# Patient Record
Sex: Female | Born: 1960 | Race: Black or African American | Hispanic: No | Marital: Single | Smoking: Never smoker
Health system: Southern US, Community
[De-identification: ages and names within clinical notes are randomized; demographics above are authoritative.]

## PROBLEM LIST (undated history)

## (undated) DIAGNOSIS — R0602 Shortness of breath: Secondary | ICD-10-CM

## (undated) HISTORY — PX: BREAST BIOPSY: SHX20

## (undated) HISTORY — PX: OTHER SURGICAL HISTORY: SHX169

## (undated) HISTORY — PX: TUBAL LIGATION: SHX77

---

## 1998-01-28 ENCOUNTER — Encounter: Admission: RE | Admit: 1998-01-28 | Discharge: 1998-01-28 | Payer: Self-pay | Admitting: Sports Medicine

## 1998-02-02 ENCOUNTER — Encounter: Admission: RE | Admit: 1998-02-02 | Discharge: 1998-02-02 | Payer: Self-pay | Admitting: Family Medicine

## 1998-02-09 ENCOUNTER — Encounter: Admission: RE | Admit: 1998-02-09 | Discharge: 1998-02-09 | Payer: Self-pay | Admitting: Family Medicine

## 1998-04-19 ENCOUNTER — Inpatient Hospital Stay (HOSPITAL_COMMUNITY): Admission: EM | Admit: 1998-04-19 | Discharge: 1998-04-22 | Payer: Self-pay | Admitting: Emergency Medicine

## 1998-04-21 ENCOUNTER — Encounter: Admission: RE | Admit: 1998-04-21 | Discharge: 1998-04-21 | Payer: Self-pay | Admitting: Family Medicine

## 1998-04-30 ENCOUNTER — Encounter: Admission: RE | Admit: 1998-04-30 | Discharge: 1998-04-30 | Payer: Self-pay | Admitting: Family Medicine

## 1998-05-05 ENCOUNTER — Encounter: Admission: RE | Admit: 1998-05-05 | Discharge: 1998-05-05 | Payer: Self-pay | Admitting: Family Medicine

## 2004-05-05 ENCOUNTER — Emergency Department (HOSPITAL_COMMUNITY): Admission: EM | Admit: 2004-05-05 | Discharge: 2004-05-05 | Payer: Self-pay | Admitting: Emergency Medicine

## 2004-07-27 ENCOUNTER — Emergency Department (HOSPITAL_COMMUNITY): Admission: EM | Admit: 2004-07-27 | Discharge: 2004-07-27 | Payer: Self-pay | Admitting: Emergency Medicine

## 2004-12-05 ENCOUNTER — Emergency Department (HOSPITAL_COMMUNITY): Admission: EM | Admit: 2004-12-05 | Discharge: 2004-12-06 | Payer: Self-pay | Admitting: Emergency Medicine

## 2005-02-20 ENCOUNTER — Ambulatory Visit: Payer: Self-pay | Admitting: Internal Medicine

## 2005-02-20 ENCOUNTER — Observation Stay (HOSPITAL_COMMUNITY): Admission: EM | Admit: 2005-02-20 | Discharge: 2005-02-21 | Payer: Self-pay | Admitting: Podiatry

## 2005-02-27 ENCOUNTER — Ambulatory Visit: Payer: Self-pay | Admitting: Internal Medicine

## 2005-03-19 ENCOUNTER — Ambulatory Visit: Payer: Self-pay | Admitting: Family Medicine

## 2005-03-19 ENCOUNTER — Inpatient Hospital Stay (HOSPITAL_COMMUNITY): Admission: EM | Admit: 2005-03-19 | Discharge: 2005-03-21 | Payer: Self-pay | Admitting: Emergency Medicine

## 2005-04-04 ENCOUNTER — Ambulatory Visit: Payer: Self-pay | Admitting: Family Medicine

## 2005-05-26 ENCOUNTER — Ambulatory Visit: Payer: Self-pay | Admitting: Family Medicine

## 2005-07-26 ENCOUNTER — Emergency Department (HOSPITAL_COMMUNITY): Admission: EM | Admit: 2005-07-26 | Discharge: 2005-07-26 | Payer: Self-pay | Admitting: Emergency Medicine

## 2005-08-04 ENCOUNTER — Ambulatory Visit: Payer: Self-pay | Admitting: Family Medicine

## 2006-01-17 ENCOUNTER — Emergency Department (HOSPITAL_COMMUNITY): Admission: EM | Admit: 2006-01-17 | Discharge: 2006-01-18 | Payer: Self-pay | Admitting: Emergency Medicine

## 2006-04-13 ENCOUNTER — Ambulatory Visit: Payer: Self-pay | Admitting: Sports Medicine

## 2006-04-17 ENCOUNTER — Encounter: Payer: Self-pay | Admitting: Emergency Medicine

## 2006-04-18 ENCOUNTER — Inpatient Hospital Stay (HOSPITAL_COMMUNITY): Admission: EM | Admit: 2006-04-18 | Discharge: 2006-04-22 | Payer: Self-pay | Admitting: Sports Medicine

## 2006-05-17 ENCOUNTER — Ambulatory Visit: Payer: Self-pay | Admitting: Sports Medicine

## 2006-06-28 ENCOUNTER — Ambulatory Visit: Payer: Self-pay | Admitting: Family Medicine

## 2006-08-29 ENCOUNTER — Ambulatory Visit: Payer: Self-pay | Admitting: Family Medicine

## 2006-09-03 ENCOUNTER — Emergency Department (HOSPITAL_COMMUNITY): Admission: EM | Admit: 2006-09-03 | Discharge: 2006-09-04 | Payer: Self-pay | Admitting: Emergency Medicine

## 2006-12-20 DIAGNOSIS — E663 Overweight: Secondary | ICD-10-CM | POA: Insufficient documentation

## 2006-12-20 DIAGNOSIS — F331 Major depressive disorder, recurrent, moderate: Secondary | ICD-10-CM | POA: Insufficient documentation

## 2006-12-28 ENCOUNTER — Ambulatory Visit: Payer: Self-pay | Admitting: Sports Medicine

## 2006-12-28 DIAGNOSIS — L259 Unspecified contact dermatitis, unspecified cause: Secondary | ICD-10-CM | POA: Insufficient documentation

## 2006-12-28 DIAGNOSIS — D508 Other iron deficiency anemias: Secondary | ICD-10-CM | POA: Insufficient documentation

## 2007-03-05 ENCOUNTER — Telehealth: Payer: Self-pay | Admitting: *Deleted

## 2007-03-05 ENCOUNTER — Ambulatory Visit: Payer: Self-pay | Admitting: Family Medicine

## 2007-03-05 ENCOUNTER — Inpatient Hospital Stay (HOSPITAL_COMMUNITY): Admission: AD | Admit: 2007-03-05 | Discharge: 2007-03-11 | Payer: Self-pay | Admitting: Sports Medicine

## 2007-03-05 ENCOUNTER — Ambulatory Visit: Payer: Self-pay | Admitting: Sports Medicine

## 2007-03-12 ENCOUNTER — Ambulatory Visit: Payer: Self-pay | Admitting: Family Medicine

## 2007-03-13 ENCOUNTER — Telehealth (INDEPENDENT_AMBULATORY_CARE_PROVIDER_SITE_OTHER): Payer: Self-pay | Admitting: Family Medicine

## 2007-04-02 ENCOUNTER — Telehealth: Payer: Self-pay | Admitting: *Deleted

## 2007-04-12 ENCOUNTER — Telehealth: Payer: Self-pay | Admitting: *Deleted

## 2007-04-15 ENCOUNTER — Ambulatory Visit: Payer: Self-pay | Admitting: Family Medicine

## 2007-04-16 ENCOUNTER — Encounter: Admission: RE | Admit: 2007-04-16 | Discharge: 2007-04-16 | Payer: Self-pay | Admitting: Sports Medicine

## 2007-04-16 ENCOUNTER — Telehealth: Payer: Self-pay | Admitting: *Deleted

## 2007-04-17 ENCOUNTER — Ambulatory Visit: Payer: Self-pay | Admitting: Family Medicine

## 2007-04-30 ENCOUNTER — Telehealth (INDEPENDENT_AMBULATORY_CARE_PROVIDER_SITE_OTHER): Payer: Self-pay | Admitting: Family Medicine

## 2007-05-11 ENCOUNTER — Emergency Department (HOSPITAL_COMMUNITY): Admission: EM | Admit: 2007-05-11 | Discharge: 2007-05-11 | Payer: Self-pay | Admitting: Emergency Medicine

## 2007-05-16 ENCOUNTER — Telehealth (INDEPENDENT_AMBULATORY_CARE_PROVIDER_SITE_OTHER): Payer: Self-pay | Admitting: *Deleted

## 2007-05-16 ENCOUNTER — Encounter (INDEPENDENT_AMBULATORY_CARE_PROVIDER_SITE_OTHER): Payer: Self-pay | Admitting: Family Medicine

## 2007-08-05 ENCOUNTER — Telehealth: Payer: Self-pay | Admitting: *Deleted

## 2007-08-06 ENCOUNTER — Ambulatory Visit: Payer: Self-pay | Admitting: Family Medicine

## 2007-08-06 ENCOUNTER — Encounter (INDEPENDENT_AMBULATORY_CARE_PROVIDER_SITE_OTHER): Payer: Self-pay | Admitting: Family Medicine

## 2007-08-06 ENCOUNTER — Telehealth: Payer: Self-pay | Admitting: *Deleted

## 2007-09-06 ENCOUNTER — Telehealth: Payer: Self-pay | Admitting: *Deleted

## 2007-09-08 ENCOUNTER — Emergency Department (HOSPITAL_COMMUNITY): Admission: EM | Admit: 2007-09-08 | Discharge: 2007-09-08 | Payer: Self-pay | Admitting: Emergency Medicine

## 2007-09-09 ENCOUNTER — Telehealth: Payer: Self-pay | Admitting: *Deleted

## 2007-11-15 ENCOUNTER — Telehealth (INDEPENDENT_AMBULATORY_CARE_PROVIDER_SITE_OTHER): Payer: Self-pay | Admitting: *Deleted

## 2007-11-26 ENCOUNTER — Telehealth: Payer: Self-pay | Admitting: *Deleted

## 2007-11-27 ENCOUNTER — Telehealth: Payer: Self-pay | Admitting: *Deleted

## 2007-12-04 ENCOUNTER — Ambulatory Visit: Payer: Self-pay | Admitting: Family Medicine

## 2008-01-07 ENCOUNTER — Telehealth: Payer: Self-pay | Admitting: Family Medicine

## 2008-01-24 ENCOUNTER — Inpatient Hospital Stay (HOSPITAL_COMMUNITY): Admission: EM | Admit: 2008-01-24 | Discharge: 2008-01-26 | Payer: Self-pay | Admitting: Emergency Medicine

## 2008-01-24 ENCOUNTER — Ambulatory Visit: Payer: Self-pay | Admitting: Internal Medicine

## 2008-01-24 ENCOUNTER — Encounter: Payer: Self-pay | Admitting: Family Medicine

## 2008-01-27 ENCOUNTER — Telehealth (INDEPENDENT_AMBULATORY_CARE_PROVIDER_SITE_OTHER): Payer: Self-pay | Admitting: *Deleted

## 2008-01-28 ENCOUNTER — Ambulatory Visit: Payer: Self-pay | Admitting: Sports Medicine

## 2008-07-09 ENCOUNTER — Encounter: Payer: Self-pay | Admitting: *Deleted

## 2008-07-20 ENCOUNTER — Telehealth: Payer: Self-pay | Admitting: *Deleted

## 2008-07-21 ENCOUNTER — Inpatient Hospital Stay (HOSPITAL_COMMUNITY): Admission: EM | Admit: 2008-07-21 | Discharge: 2008-07-23 | Payer: Self-pay | Admitting: Emergency Medicine

## 2008-07-21 ENCOUNTER — Ambulatory Visit: Payer: Self-pay | Admitting: Family Medicine

## 2008-07-21 ENCOUNTER — Encounter: Payer: Self-pay | Admitting: *Deleted

## 2008-07-21 ENCOUNTER — Encounter: Payer: Self-pay | Admitting: Family Medicine

## 2008-08-31 ENCOUNTER — Ambulatory Visit: Payer: Self-pay | Admitting: Family Medicine

## 2009-01-07 ENCOUNTER — Ambulatory Visit: Payer: Self-pay | Admitting: Family Medicine

## 2009-01-07 ENCOUNTER — Encounter: Payer: Self-pay | Admitting: Family Medicine

## 2009-01-07 ENCOUNTER — Telehealth: Payer: Self-pay | Admitting: Family Medicine

## 2009-01-13 ENCOUNTER — Telehealth: Payer: Self-pay | Admitting: Family Medicine

## 2009-01-14 ENCOUNTER — Ambulatory Visit: Payer: Self-pay | Admitting: Family Medicine

## 2009-01-14 ENCOUNTER — Ambulatory Visit (HOSPITAL_COMMUNITY): Admission: RE | Admit: 2009-01-14 | Discharge: 2009-01-14 | Payer: Self-pay | Admitting: Family Medicine

## 2009-01-14 ENCOUNTER — Encounter: Payer: Self-pay | Admitting: Family Medicine

## 2009-01-14 ENCOUNTER — Telehealth: Payer: Self-pay | Admitting: Family Medicine

## 2009-01-14 LAB — CONVERTED CEMR LAB
HCT: 37.5 % (ref 36.0–46.0)
Lymphocytes Relative: 18 % (ref 12–46)
MCHC: 34.4 g/dL (ref 30.0–36.0)
MCV: 81.9 fL (ref 78.0–100.0)
Neutro Abs: 9.9 10*3/uL — ABNORMAL HIGH (ref 1.7–7.7)
RBC: 4.58 M/uL (ref 3.87–5.11)
WBC: 13.9 10*3/uL — ABNORMAL HIGH (ref 4.0–10.5)

## 2009-01-15 ENCOUNTER — Telehealth: Payer: Self-pay | Admitting: *Deleted

## 2009-01-16 ENCOUNTER — Encounter: Payer: Self-pay | Admitting: Family Medicine

## 2009-02-17 ENCOUNTER — Telehealth: Payer: Self-pay | Admitting: Family Medicine

## 2009-05-05 ENCOUNTER — Telehealth: Payer: Self-pay | Admitting: *Deleted

## 2009-05-07 ENCOUNTER — Ambulatory Visit: Payer: Self-pay | Admitting: Family Medicine

## 2009-10-26 ENCOUNTER — Encounter: Payer: Self-pay | Admitting: Family Medicine

## 2009-10-26 ENCOUNTER — Ambulatory Visit: Payer: Self-pay | Admitting: Family Medicine

## 2009-10-26 DIAGNOSIS — N63 Unspecified lump in unspecified breast: Secondary | ICD-10-CM

## 2009-10-26 LAB — CONVERTED CEMR LAB
Chlamydia, DNA Probe: NEGATIVE
GC Probe Amp, Genital: NEGATIVE
HCT: 43 % (ref 36.0–46.0)
Hemoglobin: 14.5 g/dL (ref 12.0–15.0)
MCHC: 33.7 g/dL (ref 30.0–36.0)
Platelets: 333 10*3/uL (ref 150–400)
RBC: 5.13 M/uL — ABNORMAL HIGH (ref 3.87–5.11)
RDW: 13.7 % (ref 11.5–15.5)
WBC: 5.7 10*3/uL (ref 4.0–10.5)
Whiff Test: POSITIVE

## 2009-10-28 ENCOUNTER — Telehealth: Payer: Self-pay | Admitting: *Deleted

## 2009-10-29 ENCOUNTER — Encounter: Payer: Self-pay | Admitting: Family Medicine

## 2009-11-02 ENCOUNTER — Observation Stay (HOSPITAL_COMMUNITY): Admission: EM | Admit: 2009-11-02 | Discharge: 2009-11-03 | Payer: Self-pay | Admitting: Emergency Medicine

## 2009-11-02 ENCOUNTER — Ambulatory Visit: Payer: Self-pay | Admitting: Family Medicine

## 2009-11-02 ENCOUNTER — Encounter: Payer: Self-pay | Admitting: Family Medicine

## 2009-11-02 DIAGNOSIS — J4550 Severe persistent asthma, uncomplicated: Secondary | ICD-10-CM | POA: Insufficient documentation

## 2009-11-02 DIAGNOSIS — J45909 Unspecified asthma, uncomplicated: Secondary | ICD-10-CM | POA: Insufficient documentation

## 2009-11-09 ENCOUNTER — Ambulatory Visit: Payer: Self-pay | Admitting: Family Medicine

## 2010-01-18 ENCOUNTER — Telehealth: Payer: Self-pay | Admitting: Family Medicine

## 2010-01-21 ENCOUNTER — Ambulatory Visit: Payer: Self-pay | Admitting: Family Medicine

## 2010-01-21 ENCOUNTER — Encounter: Payer: Self-pay | Admitting: Family Medicine

## 2010-01-21 LAB — CONVERTED CEMR LAB
ALT: 9 units/L (ref 0–35)
Albumin: 4.5 g/dL (ref 3.5–5.2)
Bilirubin Urine: NEGATIVE
CO2: 21 meq/L (ref 19–32)
Calcium: 9.8 mg/dL (ref 8.4–10.5)
Chloride: 108 meq/L (ref 96–112)
Creatinine, Ser: 0.87 mg/dL (ref 0.40–1.20)
GC Probe Amp, Genital: NEGATIVE
Potassium: 4.1 meq/L (ref 3.5–5.3)
Protein, U semiquant: 30
Specific Gravity, Urine: >=1.03
Total Protein: 7.6 g/dL (ref 6.0–8.3)
Urobilinogen, UA: 0.2

## 2010-01-24 ENCOUNTER — Telehealth: Payer: Self-pay | Admitting: *Deleted

## 2010-02-05 ENCOUNTER — Telehealth: Payer: Self-pay | Admitting: Family Medicine

## 2010-02-07 ENCOUNTER — Ambulatory Visit: Payer: Self-pay | Admitting: Family Medicine

## 2010-02-16 ENCOUNTER — Emergency Department (HOSPITAL_COMMUNITY): Admission: EM | Admit: 2010-02-16 | Discharge: 2010-02-16 | Payer: Self-pay | Admitting: Emergency Medicine

## 2010-03-20 ENCOUNTER — Telehealth: Payer: Self-pay | Admitting: Family Medicine

## 2010-03-20 ENCOUNTER — Emergency Department (HOSPITAL_COMMUNITY): Admission: EM | Admit: 2010-03-20 | Discharge: 2010-03-20 | Payer: Self-pay | Admitting: Family Medicine

## 2010-03-20 ENCOUNTER — Inpatient Hospital Stay (HOSPITAL_COMMUNITY): Admission: EM | Admit: 2010-03-20 | Discharge: 2010-03-22 | Payer: Self-pay | Admitting: Emergency Medicine

## 2010-03-21 ENCOUNTER — Encounter: Payer: Self-pay | Admitting: Family Medicine

## 2010-03-21 ENCOUNTER — Ambulatory Visit: Payer: Self-pay | Admitting: Family Medicine

## 2010-03-21 DIAGNOSIS — E876 Hypokalemia: Secondary | ICD-10-CM | POA: Insufficient documentation

## 2010-03-25 ENCOUNTER — Telehealth: Payer: Self-pay | Admitting: Family Medicine

## 2010-03-25 ENCOUNTER — Encounter: Payer: Self-pay | Admitting: Family Medicine

## 2010-04-11 ENCOUNTER — Telehealth: Payer: Self-pay | Admitting: Family Medicine

## 2010-04-11 ENCOUNTER — Encounter: Payer: Self-pay | Admitting: *Deleted

## 2010-04-27 ENCOUNTER — Telehealth: Payer: Self-pay | Admitting: Family Medicine

## 2010-06-09 ENCOUNTER — Encounter: Payer: Self-pay | Admitting: Family Medicine

## 2010-06-09 ENCOUNTER — Encounter: Payer: Self-pay | Admitting: Emergency Medicine

## 2010-06-10 ENCOUNTER — Inpatient Hospital Stay (HOSPITAL_COMMUNITY): Admission: EM | Admit: 2010-06-10 | Discharge: 2010-06-12 | Payer: Self-pay | Admitting: Family Medicine

## 2010-06-10 ENCOUNTER — Ambulatory Visit: Payer: Self-pay | Admitting: Family Medicine

## 2010-08-09 ENCOUNTER — Ambulatory Visit: Payer: Self-pay | Admitting: Family Medicine

## 2010-08-09 DIAGNOSIS — R5383 Other fatigue: Secondary | ICD-10-CM

## 2010-08-09 DIAGNOSIS — R5381 Other malaise: Secondary | ICD-10-CM

## 2010-08-30 ENCOUNTER — Ambulatory Visit: Payer: Self-pay | Admitting: Family Medicine

## 2010-08-30 ENCOUNTER — Encounter: Payer: Self-pay | Admitting: Family Medicine

## 2010-08-30 ENCOUNTER — Encounter: Payer: Self-pay | Admitting: *Deleted

## 2010-08-30 LAB — CONVERTED CEMR LAB
ALT: 10 units/L (ref 0–35)
AST: 15 units/L (ref 0–37)
Alkaline Phosphatase: 134 units/L — ABNORMAL HIGH (ref 39–117)
CO2: 23 meq/L (ref 19–32)
Cholesterol: 198 mg/dL (ref 0–200)
HCT: 42.5 % (ref 36.0–46.0)
MCHC: 32.9 g/dL (ref 30.0–36.0)
MCV: 86.9 fL (ref 78.0–100.0)
Platelets: 324 10*3/uL (ref 150–400)
Sodium: 139 meq/L (ref 135–145)
TSH: 0.559 microintl units/mL (ref 0.350–4.500)
Total Bilirubin: 0.3 mg/dL (ref 0.3–1.2)
Total Protein: 6.6 g/dL (ref 6.0–8.3)

## 2010-08-31 ENCOUNTER — Encounter: Payer: Self-pay | Admitting: Family Medicine

## 2010-09-01 ENCOUNTER — Ambulatory Visit: Payer: Self-pay | Admitting: Family Medicine

## 2010-09-30 ENCOUNTER — Encounter: Payer: Self-pay | Admitting: *Deleted

## 2010-09-30 ENCOUNTER — Ambulatory Visit: Payer: Self-pay

## 2010-09-30 ENCOUNTER — Emergency Department (HOSPITAL_COMMUNITY)
Admission: EM | Admit: 2010-09-30 | Discharge: 2010-09-30 | Payer: Self-pay | Source: Home / Self Care | Admitting: Emergency Medicine

## 2010-11-24 NOTE — Progress Notes (Signed)
Summary: triage  Phone Note Call from Patient Call back at [phone]   Caller: Patient Summary of Call: Has cramp in side and loss of appetite. Initial call taken by: Clydell Hakim,  January 18, 2010 11:14 AM  Follow-up for Phone Call        c/o "mild cramp on R side" also has stomach pain. comes & goes quickly. does not drink water, mostly "junk" smells funny. denies burning, itching, etc. c/o reduced appetite. states that is a problem for her. "I am a junk food addict" c/o lots of stress in life right now. sleeping is poor. takes a multivitamin daily work in appt made for tomorrow. could not come today. primary issue is loss of appetite. will need appt with pcp to go over all other complaints Follow-up by: Golden Circle RN,  January 18, 2010 11:15 AM

## 2010-11-24 NOTE — Progress Notes (Signed)
Summary: results  Phone Note Call from Patient Call back at [phone]   Caller: Patient Summary of Call: wants to know results of test the other day Initial call taken by: De Nurse,  October 28, 2009 9:42 AM  Follow-up for Phone Call        "the person you have called has a voice mailbox that has not yet been set up" will wait for her to call back Follow-up by: Golden Circle RN,  October 28, 2009 9:43 AM  Additional Follow-up for Phone Call Additional follow up Details #1::        left message with woman who answered the phone asking that pt call back Additional Follow-up by: Golden Circle RN,  October 28, 2009 4:25 PM    Additional Follow-up for Phone Call Additional follow up Details #2::    will wait for pt to call back Follow-up by: Golden Circle RN,  October 29, 2009 9:44 AM

## 2010-11-24 NOTE — Assessment & Plan Note (Signed)
Summary: cpe/pap,df   Vital Signs:  Patient profile:   50 year old female Menstrual status:  perimenopausal Weight:      181.4 pounds BMI:     29.38 Temp:     97.8 degrees F oral Pulse rate:   66 / minute Pulse rhythm:   regular BP sitting:   117 / 80  (right arm) Cuff size:   regular  Vitals Entered By: Loralee Pacas CMA (October 26, 2009 2:31 PM)  Primary Care Provider:  Delbert Harness MD   History of Present Illness: 50 yo here for routine gynecological exam  LMP:  Aug 2010, starting to have hot flashes Contraception: None, sexually active with one partner, no intercourse in past two months Regular Menses: has becoming more spaced out Hx of Anemia: yes, iron def, PICA now resolved FHx of Breast, Uterine, Cervical or Ovarian Cancer:  No Last Pap:  3 years ago? no hx of abnormal Hx of Abnormal Pap:  No Desires STD testing: yes, unfaithful partner Last Mammogram:  no Abnormalities on Self-exam:  hx of fibrocystic disease in 20's. lump still remains, no changes    Current Medications (verified): 1)  Albuterol 90 Mcg/act Aers (Albuterol) .... 2 Puffs Every 4 Hours As Needed Wheeze 2)  Advair Diskus 500-50 Mcg/dose Misc (Fluticasone-Salmeterol) .... Take One (1) Inhalation Twice A Day 3)  Albuterol Sulfate (5 Mg/ml) 0.5%  Nebu (Albuterol Sulfate) .[name] Kitchen.. 1 Q4 Prn 4)  Singulair 10 Mg Tabs (Montelukast Sodium) .[name] Kitchen.. 1 By Mouth Daily  Allergies: 1)  ! * Peanuts PMH-FH-SH reviewed-no changes except otherwise noted  Family History: Asthma (father Maricela Curet / mother), ETOH  abuse (mother), denies hx of DM, Cancer  Review of Systems      See HPI General:  Denies fever and weight loss. CV:  Denies chest pain or discomfort. Resp:  Denies chest discomfort, cough, and shortness of breath. GI:  Denies abdominal pain, constipation, diarrhea, nausea, and vomiting. GU:  Denies discharge, dysuria, genital sores, hematuria, and incontinence.  Physical Exam  General:   Well-developed,well-nourished,in no acute distress; alert,appropriate and cooperative throughout examination Nose:  External nasal examination shows no deformity or inflammation. Nasal mucosa are pink and moist without lesions or exudates. Mouth:  Oral mucosa and oropharynx without lesions or exudates.  Teeth in good repair. Neck:  No deformities, masses, or tenderness noted. Breasts:  3 cm firm mass  at 9 o'clock positionmedial to areola on left breast.  No thickening, tenderness, bulging, retraction, inflamation, nipple discharge or skin changes noted.   Lungs:  Normal respiratory effort, chest expands symmetrically. Lungs are clear to auscultation, no crackles or wheezes. Heart:  Normal rate and regular rhythm. S1 and S2 normal without gallop, murmur, click, rub or other extra sounds. Abdomen:  Bowel sounds positive,abdomen soft and non-tender without masses, organomegaly or hernias noted. Genitalia:  Pelvic Exam:        External: normal female genitalia without lesions or masses        Vagina: normal without lesions or masses        Cervix: normal without lesions or masses        Adnexa: normal bimanual exam without masses or fullness        Uterus: normal by palpation        Pap smear: performed   Impression & Recommendations:  Problem # 1:  Gynecological examination-routine (ICD-V72.31) Perimenopausal- not affecting quality of life.  Did pap, and STD testing today.  Wet prep shows no trichomonas, patient asymptomatic.  Follow-up 1 year.  Problem # 2:  BREAST MASS, LEFT (ICD-611.72) Patient has had since her 20's at which time was biopsied and diagnosed as fibrocystic disease.  Patient reports no change, no mammograms.  Given age and need for screening, given referal info for mammography today.  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 Orders: CBC-[facility] (04540)  Problem # 4:  ASTHMA,  PERSISTENT, MODERATE (ICD-493.90) Doing well.  Refilled medications today.  Her updated medication list for this problem includes:    Albuterol 90 Mcg/act Aers (Albuterol) .[name] Kitchen... 2 puffs every 4 hours as needed wheeze    Advair Diskus 500-50 Mcg/dose Misc (Fluticasone-salmeterol) .[name] Kitchen... Take one (1) inhalation twice a day    Albuterol Sulfate (5 Mg/ml) 0.5% Nebu (Albuterol sulfate) .[name] Kitchen... 1 q4 prn    Singulair 10 Mg Tabs (Montelukast sodium) .[name] Kitchen... 1 by mouth daily  Complete Medication List: 1)  Albuterol 90 Mcg/act Aers (Albuterol) .... 2 puffs every 4 hours as needed wheeze 2)  Advair Diskus 500-50 Mcg/dose Misc (Fluticasone-salmeterol) .... Take one (1) inhalation twice a day 3)  Albuterol Sulfate (5 Mg/ml) 0.5% Nebu (Albuterol sulfate) .[name] Kitchen.. 1 q4 prn 4)  Singulair 10 Mg Tabs (Montelukast sodium) .[name] Kitchen.. 1 by mouth daily  Other Orders: GC/Chlamydia-[facility] ([id number]) Pap Smear-[facility] ([id number]) HIV-[facility] [phone]) RPR-[facility] [phone]) Wet Prep- [facility] [phone]) Flu Vaccine 68yrs + (44010) Admin 1st Vaccine (27253)  Patient Instructions: 1)  Nice to see you! 2)  Make follow-up appt if you start to have problems with menopausal symptoms or insomnia. 3)  Will call you with abnormal results, will send you a letter with normal results. Prescriptions: SINGULAIR 10 MG TABS (MONTELUKAST SODIUM) 1 by mouth daily  #30 x 3   Entered and Authorized by:   Delbert Harness MD   Signed by:   Delbert Harness MD on 10/26/2009   Method used:   Handwritten   RxID:   [id number] ADVAIR DISKUS 500-50 MCG/DOSE MISC (FLUTICASONE-SALMETEROL) Take one (1) inhalation twice a day  #1 x 3   Entered and Authorized by:   Delbert Harness MD   Signed by:   Delbert Harness MD on 10/26/2009   Method used:   Handwritten   RxID:   [id number] ALBUTEROL 90 MCG/ACT AERS (ALBUTEROL) 2 puffs every 4 hours as needed wheeze  #1 x 3   Entered and Authorized by:   Delbert Harness MD   Signed by:   Delbert Harness MD on 10/26/2009   Method  used:   Handwritten   RxID:   [id number]    Immunizations Administered:  Influenza Vaccine # 1:    Vaccine Type: Fluvax 3+    Site: right deltoid    Mfr: GlaxoSmithKline    Dose: 0.5 ml    Route: IM    Given by: Gladstone Pih    Exp. Date: 04/21/2010    Lot #: [id number]    VIS given: 10/10  Flu Vaccine Consent Questions:    Do you have a history of severe allergic reactions to this vaccine? no    Any prior history of allergic reactions to egg and/or gelatin? no    Do you have a sensitivity to the preservative Thimersol? no    Do you have a past history of Guillan-Barre Syndrome? no    Do you currently have an acute febrile illness? no    Have you ever had a severe reaction to latex? no  Vaccine information given and explained to patient? yes    Are you currently pregnant? no   Laboratory Results  Date/Time Received: October 26, 2009 2:50 PM  Date/Time Reported: October 26, 2009 5:09 PM   [employer] Source: vag WBC/hpf: <5 Bacteria/hpf: 3+  Cocci Clue cells/hpf: many  Positive whiff Yeast/hpf: few Trichomonas/hpf: none Comments: ...............test performed by......[name] KitchenBonnie A. [place], MLS (ASCP)cm

## 2010-11-24 NOTE — Miscellaneous (Signed)
Summary: triage  Clinical Lists Changes patient calls stating she has been vomiting for 3 days. she is moaning over the phone and crying. advised patient to go to ED. she has no way to go except by bus. advised her to call EMS. Theresia Lo RN  September 30, 2010 3:52 PM

## 2010-11-24 NOTE — Progress Notes (Signed)
Summary: Emergency Line Call  Patient called with concern on quarter-sized red spot on back of thigh that it red and slightly tender to palpation. She denies trauma, lesion/drainage, raised/hard/fluctuant, leg pain, CP, SOB, fever/chills. States that she was hospitalized x 1 month ago for asthma and treated with large doses of prednisone. Advised patient to call in am for work-in appt. Red Flags given for evaluation in ED. Helane Rima DO  April 27, 2010 9:28 PM

## 2010-11-24 NOTE — Progress Notes (Signed)
Summary: needs meds  Phone Note Call from Patient Call back at [phone]   Caller: Patient Summary of Call: excema is flairing up and needs another rx [facility]  Initial call taken by: De Nurse,  April 11, 2010 10:24 AM  Follow-up for Phone Call        she has not had this "in a while" needs appt to be seen about this. cannot come in today. appt for tomorrow w/Dylan Monforte  Follow-up by: Golden Circle RN,  April 11, 2010 10:31 AM  Additional Follow-up for Phone Call Additional follow up Details #1::        Patient [facility] for hospital follow-up due to admission to step down unit for asthma exacerbation due to not able to afford advair.  Please address if patient also needs refills on asthma meds if you are able to. Additional Follow-up by: Delbert Harness MD,  April 11, 2010 1:15 PM

## 2010-11-24 NOTE — Assessment & Plan Note (Signed)
Summary: nausea x1 week, BM sluggish, protusion at [employer] area, urine  ...   Vital Signs:  Patient profile:   50 year old female Menstrual status:  perimenopausal Weight:      180.3 pounds Temp:     98.3 degrees F oral Pulse rate:   89 / minute Pulse rhythm:   regular BP sitting:   128 / 86  (left arm) Cuff size:   regular  Vitals Entered By: Loralee Pacas CMA (January 21, 2010 2:23 PM) CC: nause and abd pain Comments nausea x 1 week, decrease appetite, right sided pain,    Primary Care Provider:  Delbert Harness MD  CC:  nause and abd pain.  History of Present Illness: 1.  nausea and abd pain nausea X 1 week intermittently.  also aching pain.  malaise.  not eaten much past week. no vomitting pain lasts seconds; nausea lasts for seconds - 2-3 minutes.  having 4-5 episodes per day.  symptoms not really related to eating epigastric location +bloating bms have been "stringy" the past few weeks belly button feels like it pushes out when she coughs no fever foul smelling urine, but no dysuria, no frequency  never had stomach problems before only abd surgery is btl no relieving factors nephew also complaining of stomach ache  spent >25 min face to face counseling with pt      Allergies: 1)  ! * Peanuts  Past History:  Past Medical History: NSVD x3,  Multiple prev. hosp. 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 GI:  Complains of abdominal pain, change in bowel habits, constipation, loss of appetite, and nausea; denies bloody stools, dark tarry stools, diarrhea, gas, vomiting, and vomiting blood. GU:  Denies abnormal vaginal bleeding, discharge, dysuria, genital sores, hematuria, and incontinence.  Physical Exam  General:  Well-developed,well-nourished,in no acute distress; alert,appropriate and  cooperative throughout examination Abdomen:  hypoactive bowel sounds.  soft, no distention, no masses, no guarding, and no rigidity.  mild ttp epigastrium and suprapubic.  no RUQ  or RLQ tenderness.    does have small (1 cm) umbilical hernia Rectal:  No external abnormalities noted. Normal sphincter tone. No rectal masses or tenderness. Genitalia:  Pelvic Exam:        External: normal female genitalia without lesions or masses        Vagina: normal without lesions or masses        Cervix: normal without lesions or masses; no cervical motion tenderness        Adnexa: normal bimanual exam without masses or fullness        Uterus: normal by palpation        Pap smear: not performed Additional Exam:  vital signs reviewed    Impression & Recommendations:  Problem # 1:  ABDOMINAL PAIN (ICD-789.00) Assessment New cause for her symptoms is not at all clear.  no periotoneal signs or cervical motion tenderness on exam.  perhaps this is a low level viral gastroenteritis.  u/a, wet prep consistent with bacterial vaginosis.  however,  microscopic hematuria present as well; so this could also be nephrolithiasis with an unusual presentation.  does have small hernia, but doubt this is related to her symptoms  [place] cmet, give her a strainer, diclofenac for pain in case kidney stone, treat BV (results came back after pt left; so will  call pt and call in script to pharmacy of her choice), miralax for constipation in case that is contributing.  rtc if not better early next week.  given red flags to seek emergency care over the weekend. Orders: Comp Met-[facility] [phone]) [facility]- Est  Level 4 (56213)  Problem # 2:  NAUSEA (ICD-787.02) Assessment: New see discussion above Orders: Comp Met-[facility] ([id number]) [facility]- Est  Level 4 (29528)  Complete Medication List: 1)  Albuterol 90 Mcg/act Aers (Albuterol) .... 2 puffs every 4 hours as needed wheeze 2)  Advair Diskus 500-50 Mcg/dose Misc  (Fluticasone-salmeterol) .... Take one (1) inhalation twice a day 3)  Albuterol Sulfate (5 Mg/ml) 0.5% Nebu (Albuterol sulfate) .[name] Kitchen.. 1 q4 prn 4)  Singulair 10 Mg Tabs (Montelukast sodium) .[name] Kitchen.. 1 by mouth daily 5)  Miralax Powd (Polyethylene glycol 3350) .[name] KitchenMarland KitchenMarland Kitchen 17 g in 8 ozs fluid one to two times per day for constipation; dispense one large bottle 6)  Diclofenac Sodium 75 Mg Tbec (Diclofenac sodium) .[name] Kitchen.. 1 tab by mouth two times a day as needed pain 7)  Metronidazole 500 Mg Tabs (Metronidazole) .[name] Kitchen.. 1 tab by mouth two times a day for 7 days for bacterial vaginosis  Other [facility]: [facility] (00000) [facility] GC/Chlamydia-[facility] ([id number]/87491)  Patient Instructions: 1)  It was nice to see you today. 2)  One possible cause of your pain is a kidney stone, strain your urine for the next week.  If you see a stone, bring it in to the clinic. 3)  You can take the diclofenac I prescribed you for pain. 4)  For your constipation, take miralax (it is over the counter).  1 capful in 8 ozs of fluid one-two times per day.   5)  I will call you with lab results. 6)  Please schedule a follow-up appointment next week if you are not better. Prescriptions: METRONIDAZOLE 500 MG TABS (METRONIDAZOLE) 1 tab by mouth two times a day for 7 days for bacterial vaginosis  #14 x 0   Entered and Authorized by:   Asher Muir MD   Signed by:   Asher Muir MD on 01/24/2010   Method used:   Print then Give to Patient   RxID:   [id number] DICLOFENAC SODIUM 75 MG TBEC (DICLOFENAC SODIUM) 1 tab by mouth two times a day as needed pain  #45 x 0   Entered and Authorized by:   Asher Muir MD   Signed by:   Asher Muir MD on 01/21/2010   Method used:   Print then Give to Patient   RxID:   [id number] MIRALAX  POWD (POLYETHYLENE GLYCOL 3350) 17 g in 8 ozs fluid one to two times per day for constipation; dispense one large bottle  #1 x 1   Entered and Authorized by:   Asher Muir MD    Signed by:   Asher Muir MD on 01/21/2010   Method used:   Print then Give to Patient   RxID:   [id number]   Laboratory Results   Urine Tests  Date/Time Received: January 21, 2010 2:35 PM  Date/Time Reported: January 21, 2010 3:03 PM   Routine Urinalysis   Color: yellow Appearance: Clear Glucose: negative   (Normal Range: Negative) Bilirubin: small-icto negative   (Normal Range: Negative) Ketone: trace (5)   (Normal Range: Negative) Spec. Gravity: >=1.030   (Normal Range: 1.003-1.035) Blood: small   (Normal Range: Negative) pH: 6.0   (Normal Range: 5.0-8.0) Protein: 30   (Normal Range: Negative) Urobilinogen: 0.2   (  Normal Range: 0-1) Nitrite: negative   (Normal Range: Negative) Leukocyte Esterace: negative   (Normal Range: Negative)  Urine Microscopic WBC/HPF: 5-10 RBC/HPF: 5-10 Bacteria/HPF: 2+ Mucous/HPF: 1+ Epithelial/HPF: 5-10    Comments: ...........test performed by...........[name] KitchenTerese Door, CMA  Date/Time Received: January 21, 2010 2:35 PM  Date/Time Reported: January 21, 2010 3:19 PM   [employer] Source: vaginal WBC/hpf: 0-3 Bacteria/hpf: 3+  Cocci Clue cells/hpf: many  Positive whiff Yeast/hpf: none Trichomonas/hpf: none Comments: rod bacteria also present  ...........test performed by...........[name] KitchenTerese Door, CMA

## 2010-11-24 NOTE — Assessment & Plan Note (Signed)
Summary: READ PPD/KH  Nurse Visit   Allergies: 1)  ! * Peanuts  PPD Results    Date of reading: 09/01/2010    Results: 0 mm    Interpretation: negative  Orders Added: 1)  No Charge Patient Arrived (NCPA0) [NCPA0]

## 2010-11-24 NOTE — Assessment & Plan Note (Signed)
Summary: f/u,tcb   Vital Signs:  Patient profile:   50 year old female Menstrual status:  perimenopausal Weight:      183 pounds BMI:     29.64 Temp:     98.4 degrees F oral Pulse rate:   78 / minute Pulse rhythm:   regular BP sitting:   130 / 86  (left arm) Cuff size:   regular  Vitals Entered By: Loralee Pacas CMA (August 09, 2010 3:09 PM) CC: follow-up visit   Primary Care [facility] Farrior:  Delbert Harness MD  CC:  follow-up visit.  History of Present Illness: 50 yo here for follow-up  asthma: not takign singulair currently until she renews debra hill in the next few weeks.  still has been taking advair as directed.  Has not had dyspnea.  Reports not havin gto use albuterol in at least the past month.  insomina: taking trazodone for sleep  a few times per month.  Has been working on sleep hygeine, removed tv from the bedroom.  depression:  states she occaisionally feels "like when I used to have PMS" but overall does not feel any transient symptoms affect her job or relationships or wuality of life.  fatigue:  says she continues to be tired, lacking "energy" to do things.  inquires about B vitamins,       Habits & Providers  Alcohol-Tobacco-Diet     Tobacco Status: never  Current Medications (verified): 1)  Albuterol 90 Mcg/act Aers (Albuterol) .... 2 Puffs Every 4 Hours As Needed Wheeze 2)  Advair Diskus 500-50 Mcg/dose Misc (Fluticasone-Salmeterol) .... Take One (1) Inhalation Twice A Day 3)  Albuterol Sulfate (5 Mg/ml) 0.5%  Nebu (Albuterol Sulfate) .[name] Kitchen.. 1 Q4 Prn 4)  Singulair 10 Mg Tabs (Montelukast Sodium) .[name] Kitchen.. 1 By Mouth Daily 5)  Claritin 10 Mg Tabs (Loratadine) .... One Tablet Daily- Otc 6)  Trazodone Hcl 50 Mg Tabs (Trazodone Hcl) .... Take 1/2 To One Tablet Before Bedtime For Sleep 7)  Multivitamins  Tabs (Multiple Vitamin) .... Take One Tablet Daily  Allergies: 1)  ! * Peanuts PMH-FH-SH reviewed for relevance  Social History: Currenlty a Tax inspector at [employer]   11 yo son (lives at the school in [place] and 32 yo daughters that live independently.Pt has close relationship with them.No etoh/tobacco. Hx of marijuana. Released from prison on 27-Feb-2023. Father died (CHF), sister died (asthma).Mother alive.  Review of Systems      See HPI  Physical Exam  General:  Well-developed,well-nourished,in no acute distress; alert,appropriate and cooperative throughout examination.   Lungs:  Normal respiratory effort, chest expands symmetrically. Lungs are clear to auscultation, no crackles or wheezes. Heart:  Normal rate and regular rhythm. S1 and S2 normal without gallop, murmur, click, rub or other extra sounds. Psych:  Oriented X3, memory intact for recent and remote, normally interactive, good eye contact, not anxious appearing, and not depressed appearing.     Impression & Recommendations:  Problem # 1:  ASTHMA, PERSISTENT, MODERATE (ICD-493.90)  Good control currently and has been compliant with meds except for singulair although she will resume soon once she gets debra hill insurance.  Patient concerned for need for PFT's and is applying for disability due to fatigue.  Advised my goal is to keep her as functional as possible and will work with her to make sure he asthma does not limit physical activity.    Her updated medication list for this problem includes:    Albuterol 90 Mcg/act Aers (Albuterol) .[name] Kitchen... 2 puffs  every 4 hours as needed wheeze    Advair Diskus 500-50 Mcg/dose Misc (Fluticasone-salmeterol) .[name] Kitchen... Take one (1) inhalation twice a day    Albuterol Sulfate (5 Mg/ml) 0.5% Nebu (Albuterol sulfate) .[name] Kitchen... 1 q4 prn    Singulair 10 Mg Tabs (Montelukast sodium) .[name] Kitchen... 1 by mouth daily  Orders: [facility]- Est  Level 4 ([address])  Problem # 2:  FATIGUE (ICD-780.79)  likely multifactorial.  Discussed possible role of depression even though she is not overtly tearful.  Advised 30 minutes of daily aerobic exercise.  Advisd against B  vitamin supplement but encouraged to continue multivitamin.  Will check labs- she is coming back for fasting labs.  Orders: [facility]- Est  Level 4 ([address])  Problem # 3:  DEPRESSION, MAJOR, RECURRENT (ICD-296.30)  patient reluctant to be on medication although she is ok with taking trazodone as needed for insomnia.  Will take fluoxetine off med list.  Orders: [facility]- Est  Level 4 ([address]11)  Problem # 4:  Preventive Health Care (ICD-V70.0)  gave info for mammogram, will get fastign labs, discussed overweight/BMI andgiven nutritional handout.  Will start here and work on other health maintenance issues at next visit.  Orders: [facility]- Est  Level 4 ([address])  Complete Medication List: 1)  Albuterol 90 Mcg/act Aers (Albuterol) .... 2 puffs every 4 hours as needed wheeze 2)  Advair Diskus 500-50 Mcg/dose Misc (Fluticasone-salmeterol) .... Take one (1) inhalation twice a day 3)  Albuterol Sulfate (5 Mg/ml) 0.5% Nebu (Albuterol sulfate) .[name] Kitchen.. 1 q4 prn 4)  Singulair 10 Mg Tabs (Montelukast sodium) .[name] Kitchen.. 1 by mouth daily 5)  Claritin 10 Mg Tabs (Loratadine) .... One tablet daily- otc 6)  Trazodone Hcl 50 Mg Tabs (Trazodone hcl) .... Take 1/2 to one tablet before bedtime for sleep 7)  Multivitamins Tabs (Multiple vitamin) .... Take one tablet daily  Other Orders: Influenza Vaccine NON MCR (91478)  Patient Instructions: 1)  Schedule pharamcy clinic appt for Pulmonary function testing 2)  Schedule lab appointment for fasting labs- nothing to eat or drink for 8 hours 3)  See information sheet on scheduling mammogram 4)  Schedule daily aerobic exercise into your routine- this will help with energy, sleep, and your weight. 5)  See handout on nutrition tips to get started 6)  Make follow-up appointment in 2-3 weeks to discuss lab work and continue working on improving your health   Orders Added: 1)  Influenza Vaccine NON MCR [00028] 2)  [facility]- Est  Level 4 [[address]562]   Immunizations Administered:  Influenza  Vaccine # 1:    Vaccine Type: Fluvax Non-MCR    Site: left deltoid    Mfr: GlaxoSmithKline    Dose: 0.5 ml    Route: IM    Given by: Loralee Pacas CMA    Exp. Date: 04/19/2011    Lot #: [id number]    VIS given: 05/17/2010  Flu Vaccine Consent Questions:    Do you have a history of severe allergic reactions to this vaccine? no    Any prior history of allergic reactions to egg and/or gelatin? no    Do you have a sensitivity to the preservative Thimersol? no    Do you have a past history of Guillan-Barre Syndrome? no    Do you currently have an acute febrile illness? no    Have you ever had a severe reaction to latex? no    Vaccine information given and explained to patient? yes    Are you currently pregnant? no   Immunizations Administered:  Influenza Vaccine # 1:    Vaccine Type: Fluvax Non-MCR    Site: left deltoid    Mfr: GlaxoSmithKline    Dose: 0.5 ml    Route: IM    Given by: Loralee Pacas CMA    Exp. Date: 04/19/2011    Lot #: [id number]    VIS given: 05/17/2010    Prevention & Chronic Care Immunizations   Influenza vaccine: Fluvax Non-MCR  (08/09/2010)    Tetanus booster: 03/23/2005: Done.   Tetanus booster due: 03/24/2015    Pneumococcal vaccine: Not documented  Colorectal Screening   Hemoccult: Not documented    Colonoscopy: Not documented  Other Screening   Pap smear: NEGATIVE FOR INTRAEPITHELIAL LESIONS OR MALIGNANCY.  (10/26/2009)    Mammogram: Not documented   Smoking status: never  (08/09/2010)  Lipids   Total Cholesterol: Not documented   LDL: Not documented   LDL Direct: Not documented   HDL: Not documented   Triglycerides: Not documented  Appended Document: Orders Update    Clinical Lists Changes  Problems: Changed problem from OBESITY, NOS (ICD-278.00) to OVERWEIGHT (ICD-278.02) Orders: Added new Test order of Comp Met-[facility] [phone]) - Signed Added new Test order of CBC-[facility] [phone]) - Signed Added new Test order of  Lipid-[facility] ([id number]) - Signed Added new Test order of TSH-[facility] ([id number]) - Signed      Appended Document: Asthma, persistent     Clinical Lists Changes  Problems: Changed problem from ASTHMA, PERSISTENT, MODERATE (ICD-493.90) to ASTHMA, PERSISTENT (ICD-493.90)      Appended Document: f/u,tcb    Clinical Lists Changes  Problems: Changed problem from ASTHMA, PERSISTENT, MODERATE (ICD-493.90) to ASTHMA, PERSISTENT (ICD-493.90) Removed problem of ASTHMA, PERSISTENT (ICD-493.90)

## 2010-11-24 NOTE — Letter (Signed)
Summary: Probation Letter  [facility]  [address]   [place], [place] [address]   Phone: [phone]  Fax: [phone]    04/11/2010  DEB[address] [place] [address] [place], [place]  [address]  Dear Ms. Taranto,  With the goal of better serving all our patients the [facility] is following each patient's missed appointments.  You have missed at least 3 appointments with our practice.If you cannot keep your appointment, we expect you to call at least 24 hours before your appointment time.  Missing appointments prevents other patients from seeing [place] and makes it difficult to provide you with the best possible medical care.      1.   If you miss one more appointment, we will only give you limited medical services. This means we will not call in medication refills, complete a form, or make a referral for you except when you are here for a scheduled office visit.    2.   If you miss 2 or more appointments in the next year, we will dismiss you from our practice.    Our office staff can be reached at [phone] Monday through Friday from 8:30 a.m.-5:00 p.m. and will be glad to schedule your appointment as necessary.    Thank you.   [facility]

## 2010-11-24 NOTE — Progress Notes (Signed)
Summary: Note Needed  Phone Note Call from Patient Call back at [phone]   Caller: Patient Summary of Call: Pt was in hospital for asthma and wondering if she can get note for work.  Would like like note for her to go back on Monday.  Fax to [phone] attn: Romeo Rabon Initial call taken by: Clydell Hakim,  March 25, 2010 3:02 PM     sent to white team to complete. Jamie Brookes MD  March 25, 2010 3:24 PM

## 2010-11-24 NOTE — Assessment & Plan Note (Signed)
Summary: depression, asthma [facility]   Vital Signs:  Patient profile:   50 year old female Menstrual status:  perimenopausal Height:      66 inches Weight:      178.0 pounds BMI:     28.83 O2 Sat:      98 % on Room air Temp:     98.3 degrees F oral Pulse rate:   90 / minute BP sitting:   129 / 83  Vitals Entered By: Gladstone Pih (February 07, 2010 10:25 AM)  O2 Flow:  Room air  CC: F/U, asthma issues, Depression Is Patient Diabetic? No Pain Assessment Patient in pain? no      Comments peak flow 250   Primary Care Provider:  Delbert Harness MD  CC:  F/U, asthma issues, and Depression.  History of Present Illness: 55 with asthma with dyspnea x 1 week.  Asthma: Triggered x 1 week, change in weather.  Spring is also worse for patient.  No URI symptoms.  No cough.  Not getting worse.   Has been taking Advair once daily but is regularly- supposed tobe twice a day.  Has been usuing albuterol nebulizer every four hours.  Has also been taking claritin OTC. Has not been taking singulair due to cost.  Peak flows 350-360 at home, per patient, she usualy blows about 400-450.    Depression:  Pt begins to cry duruing interview.  Says she has been struggling with sadness, anhedonia, poor appetitiem, insomnia, off and on for a year.  No history of previous evaluation or treatment for depression, anxiety, or other mood disorder but feels she has had this before in her life.  No new life events.  No history of SI, HI.  No manic symptoms.  See Depression checklist for further details.  Depression History:      Positive alarm features for depression include significant weight loss, insomnia, and fatigue (loss of energy).  However, she denies recurrent thoughts of death or suicide.  The patient denies symptoms of a manic disorder including persistently & abnormally elevated mood, less need for sleep, talkative or feels need to keep talking, flight of ideas, excessive buying sprees, and excessive sexual  indiscretions.        Suicide risk questions reveal that she does not feel like life is worth living.  The patient denies that she wishes that she were dead and denies that she has thought about ending her life.         Depression Treatment History:  Prior Medication Used:   Start Date: Assessment of Effect:   Comments:  None     --       --       --   Habits & Providers  Alcohol-Tobacco-Diet     Tobacco Status: never  Current Medications (verified): 1)  Albuterol 90 Mcg/act Aers (Albuterol) .... 2 Puffs Every 4 Hours As Needed Wheeze 2)  Advair Diskus 500-50 Mcg/dose Misc (Fluticasone-Salmeterol) .... Take One (1) Inhalation Twice A Day 3)  Albuterol Sulfate (5 Mg/ml) 0.5%  Nebu (Albuterol Sulfate) .[name] Kitchen.. 1 Q4 Prn 4)  Singulair 10 Mg Tabs (Montelukast Sodium) .[name] Kitchen.. 1 By Mouth Daily 5)  Miralax  Powd (Polyethylene Glycol 3350) .[name] KitchenMarland KitchenMarland Kitchen 17 G in 8 Ozs Fluid One To Two Times Per Day For Constipation; Dispense One Large Bottle 6)  Diclofenac Sodium 75 Mg Tbec (Diclofenac Sodium) .[name] Kitchen.. 1 Tab By Mouth Two Times A Day As Needed Pain 7)  Claritin 10 Mg Tabs (Loratadine) .... One Tablet  Daily- Otc 8)  Fluoxetine Hcl 20 Mg Caps (Fluoxetine Hcl) .... Take One Tablet Daily 9)  Trazodone Hcl 50 Mg Tabs (Trazodone Hcl) .... Take 1/2 To One Tablet Before Bedtime For Sleep  Allergies: 1)  ! * Peanuts PMH-FH-SH reviewed-no changes except otherwise noted  Review of Systems      See HPI General:  Complains of fatigue, loss of appetite, sleep disorder, and weight loss. Resp:  Complains of shortness of breath; denies cough, sputum productive, and wheezing. Psych:  Complains of anxiety, depression, and easily tearful; denies irritability, mental problems, panic attacks, suicidal thoughts/plans, thoughts of violence, unusual visions or sounds, and thoughts /plans of harming others.  Physical Exam  General:  Well-developed,well-nourished,in no acute distress; alert,appropriate and cooperative throughout  examination.  During middle of interview, begins to cry. Lungs:  Normal respiratory effort, chest expands symmetrically. Lungs are clear to auscultation, no crackles or wheezes. Heart:  Normal rate and regular rhythm. S1 and S2 normal without gallop, murmur, click, rub or other extra sounds. Extremities:  no edema.  no cyanosis or clubbing Neurologic:  alert & oriented X3, cranial nerves II-XII intact, and gait normal.   Psych:  Pleasant, good eye contact, slightly subdued.  Begins to cry during interview.  Oriented X3, memory intact for recent and remote, and normally interactive.     Impression & Recommendations:  Problem # 1:  DEPRESSION, MAJOR, RECURRENT (ICD-296.30) Assessment New  Unipolar depression.  Discussed with patient options for treatment.  She would like to start medications today and also would like psychology referral.  Given Dr. Carola Rhine info.  Discussed side effects, expectation for several weeks before medication effects, need for treatment for 6-12 months, possibly lifelong if recurrent.  Talked with patient about daily exercise as part of treatment.  Patient fearful about losing to much weight due to poor appetite.  reassured, discussed appetite will improve with treatment.  BMI 29.  Filled trazodone for temporary use while titrating SSRI for sleep.  Discussed with pt that one of partners will follow-up in 2-3 weeks.  Will likely need upward titration of fluoxetine.  Orders: [facility]- Est  Level 4 ([address])  Problem # 2:  ASTHMA, PERSISTENT, MODERATE (ICD-493.90) Assessment: Deteriorated  Clinical exam today without evidence of acute exacerbation.  Discussed with patient taking advair twice a day and resuming singulair as afforded, continue taking claritin.  Pt with history of hospitalization for asthma.  today i suspect anxiety may be playing a role in sensation of dyspnea.  Discussed with pt not giving prednisone since she is clinically stable and to monitor for red flags that  asthma is flaring and to return to clinic or after hours, urgent care/ER for further evaluation.  Her updated medication list for this problem includes:    Albuterol 90 Mcg/act Aers (Albuterol) .[name] Kitchen... 2 puffs every 4 hours as needed wheeze    Advair Diskus 500-50 Mcg/dose Misc (Fluticasone-salmeterol) .[name] Kitchen... Take one (1) inhalation twice a day    Albuterol Sulfate (5 Mg/ml) 0.5% Nebu (Albuterol sulfate) .[name] Kitchen... 1 q4 prn    Singulair 10 Mg Tabs (Montelukast sodium) .[name] Kitchen... 1 by mouth daily  Orders: [facility]- Est  Level 4 ([address])  Complete Medication List: 1)  Albuterol 90 Mcg/act Aers (Albuterol) .... 2 puffs every 4 hours as needed wheeze 2)  Advair Diskus 500-50 Mcg/dose Misc (Fluticasone-salmeterol) .... Take one (1) inhalation twice a day 3)  Albuterol Sulfate (5 Mg/ml) 0.5% Nebu (Albuterol sulfate) .[name] Kitchen.. 1 q4 prn 4)  Singulair 10 Mg Tabs (Montelukast  sodium) .[name] Kitchen.. 1 by mouth daily 5)  Miralax Powd (Polyethylene glycol 3350) .[name] KitchenMarland KitchenMarland Kitchen 17 g in 8 ozs fluid one to two times per day for constipation; dispense one large bottle 6)  Diclofenac Sodium 75 Mg Tbec (Diclofenac sodium) .[name] Kitchen.. 1 tab by mouth two times a day as needed pain 7)  Claritin 10 Mg Tabs (Loratadine) .... One tablet daily- otc 8)  Fluoxetine Hcl 20 Mg Caps (Fluoxetine hcl) .... Take one tablet daily 9)  Trazodone Hcl 50 Mg Tabs (Trazodone hcl) .... Take 1/2 to one tablet before bedtime for sleep  Patient Instructions: 1)  Make appointment with Dr. Pascal Lux, psychologist. 2)  Work on daily exercise which will help lift your mood. 3)  Follow-up in 2-3 weeks. 4)  Make sooner appointment i fyou have problems with yoru asthma. 5)  See handout on depression. Prescriptions: ALBUTEROL 90 MCG/ACT AERS (ALBUTEROL) 2 puffs every 4 hours as needed wheeze  #1 x 3   Entered and Authorized by:   Delbert Harness MD   Signed by:   Delbert Harness MD on 02/07/2010   Method used:   Electronically to        [facility] [address] (retail)       [phone]       [place], [place]  [address]       Ph: [phone]       Fax: [phone]   RxID:   [id number] TRAZODONE HCL 50 MG TABS (TRAZODONE HCL) take 1/2 to one tablet before bedtime for sleep  #30 x 1   Entered and Authorized by:   Delbert Harness MD   Signed by:   Delbert Harness MD on 02/07/2010   Method used:   Electronically to        [facility] [address] (retail)       [phone]       [place], [place]  [address]       Ph: [phone]       Fax: [phone]   RxID:   [id number] FLUOXETINE HCL 20 MG CAPS (FLUOXETINE HCL) take one tablet daily  #30 x 0   Entered and Authorized by:   Delbert Harness MD   Signed by:   Delbert Harness MD on 02/07/2010   Method used:   Electronically to        [facility] [address] (retail)       [phone]       [place], [place]  [address]       Ph: [phone]       Fax: [phone]   RxID:   [id number]

## 2010-11-24 NOTE — Initial Assessments (Signed)
Summary: H&P asthma exacerbation   Vital Signs:  Patient profile:   50 year old female Menstrual status:  perimenopausal Temp:     98.6 degrees F Pulse rate:   99 / minute Pulse rhythm:   regular BP supine:   114 / 76  Primary Care Lydia Griffin:  Lydia Harness MD   History of Present Illness:  In brief, patient is a 50 y/o female with history of moderate persistent asthma who presents with worsening dyspnea x4 days preceded by a head cold. Pt has missed multiple appoints but states has been taking her medications. she noticed progressive worsening dyspnea and wheezing, with increasing albuterol use up to 2 puffs every 2-3 hours without improvement.  Pt also states she has had a cough which has been productive. Pt has had sick contacts who also presented with same symptoms lasted 2 days and then got better but they did not have asthma. She intiailly was seen at [facility] and transferred to our service due to remaining dyspneic with an oxygen requirement after albuterol nebulizer treatment and magnesium. Patient denies fever, chest pain, chills, rhinorrhea.    Problems Prior to Update: 1)  Hypokalemia, Mild  (ICD-276.8) 2)  Asthma, With Acute Exacerbation  (ICD-493.92) 3)  Breast Mass, Left  (ICD-611.72) 4)  Obesity, Nos  (ICD-278.00) 5)  Depression, Major, Recurrent  (ICD-296.30) 6)  Eczema  (ICD-692.9) 7)  Anemia Due To Dietary Iron Deficiency  (ICD-280.1) 8)  Asthma, Persistent, Moderate  (ICD-493.90)  Medications Prior to Update: 1)  Albuterol 90 Mcg/act Aers (Albuterol) .... 2 Puffs Every 4 Hours As Needed Wheeze 2)  Advair Diskus 500-50 Mcg/dose Misc (Fluticasone-Salmeterol) .... Take One (1) Inhalation Twice A Day 3)  Albuterol Sulfate (5 Mg/ml) 0.5%  Nebu (Albuterol Sulfate) .[name] Kitchen.. 1 Q4 Prn 4)  Singulair 10 Mg Tabs (Montelukast Sodium) .[name] Kitchen.. 1 By Mouth Daily 5)  Miralax  Powd (Polyethylene Glycol 3350) .[name] KitchenMarland KitchenMarland Kitchen 17 G in 8 Ozs Fluid One To Two Times Per Day For Constipation; Dispense One Large  Bottle 6)  Diclofenac Sodium 75 Mg Tbec (Diclofenac Sodium) .[name] Kitchen.. 1 Tab By Mouth Two Times A Day As Needed Pain 7)  Claritin 10 Mg Tabs (Loratadine) .... One Tablet Daily- Otc 8)  Fluoxetine Hcl 20 Mg Caps (Fluoxetine Hcl) .... Take One Tablet Daily 9)  Trazodone Hcl 50 Mg Tabs (Trazodone Hcl) .... Take 1/2 To One Tablet Before Bedtime For Sleep  Current Medications (verified): 1)  Albuterol 90 Mcg/act Aers (Albuterol) .... 2 Puffs Every 4 Hours As Needed Wheeze 2)  Advair Diskus 500-50 Mcg/dose Misc (Fluticasone-Salmeterol) .... Take One (1) Inhalation Twice A Day 3)  Albuterol Sulfate (5 Mg/ml) 0.5%  Nebu (Albuterol Sulfate) .[name] Kitchen.. 1 Q4 Prn 4)  Singulair 10 Mg Tabs (Montelukast Sodium) .[name] Kitchen.. 1 By Mouth Daily 5)  Miralax  Powd (Polyethylene Glycol 3350) .[name] KitchenMarland KitchenMarland Kitchen 17 G in 8 Ozs Fluid One To Two Times Per Day For Constipation; Dispense One Large Bottle 6)  Diclofenac Sodium 75 Mg Tbec (Diclofenac Sodium) .[name] Kitchen.. 1 Tab By Mouth Two Times A Day As Needed Pain 7)  Claritin 10 Mg Tabs (Loratadine) .... One Tablet Daily- Otc 8)  Fluoxetine Hcl 20 Mg Caps (Fluoxetine Hcl) .... Take One Tablet Daily 9)  Trazodone Hcl 50 Mg Tabs (Trazodone Hcl) .... Take 1/2 To One Tablet Before Bedtime For Sleep  Allergies (verified): 1)  ! * Peanuts  Past History:  Past Medical History: Last updated: 01/21/2010 NSVD x3,  Multiple prev. hosp. 2 to asthma (intubated once) asthma  Family History: Last updated: 10/26/2009 Asthma (father Maricela Curet / mother), ETOH  abuse (mother), denies hx of DM, Cancer  Social History: Last updated: 03/21/2010 Pt. 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  78 yo son (lives at the school in [place] and 57 yo daughters that live independently.Pt has close relationship with them.No etoh/tobacco. Hx of marijuana. Released from prison on 22-Mar-2023. Father died (CHF), sister died (asthma).Mother alive.  Review of Systems       see  hpi  Physical Exam  General:  Well-developed,well-nourished,in no acute distress; alert,appropriate and cooperative throughout examination.  having some cough Eyes:  PERRL Ears:  tms normal Mouth:  MMM.  no cyanosis.  oropharynx with pharyngeal erythema--mild.  no exudate.   Neck:  supple; no significant LAD Lungs:  weezes heard throughout lungs no focal findings, does have air movement throughout.  Heart:  Normal rate and regular rhythm. S1 and S2 normal without gallop, murmur, click, rub or other extra sounds. Abdomen:  hypoactive bowel sounds.  soft, no distention, no masses, no guarding, and no rigidity.    Pulses:  2+ dp pulses Extremities:  no edema.  no cyanosis or clubbing Neurologic:  alert & oriented X3, cranial nerves II-XII intact,  Labs: CBC: 6.9>13.4/39.4<290 86% neutro BMET 141/3.7/104/26/8/1.1<124 CXR: consistent with asthma  Impression & Recommendations:  Problem # 1:  ASTHMA, WITH ACUTE EXACERBATION (ZOX-096.04)  Patient presents with signs and symptoms most consistent with acute asthma exacerbation. Has had recent sick contacts.  WBC normal does have increase in neutrophils but may be due to steroids  Plan to treat patient with Albuterol Q4/Q2 prn, will allow RT to titrate. Also plan to start Prednisone 60 mg by mouth daily.   Pt last admission stayed two days and did well with a prednisone taper.  Plan to start Protonix as she is on steroids.  Continue her singulair.  Will get repeat CXR in AM and labs in Am to make sure not developing PNA.  Pt appears very stable so will not get ABG unless feels pt is having worsening of symptoms.   Her updated medication list for this problem includes:    Albuterol 90 Mcg/act Aers (Albuterol) .[name] Kitchen... 2 puffs every 4 hours as needed wheeze    Advair Diskus 500-50 Mcg/dose Misc (Fluticasone-salmeterol) .[name] Kitchen... Take one (1) inhalation twice a day    Albuterol Sulfate (5 Mg/ml) 0.5% Nebu (Albuterol sulfate) .[name] Kitchen... 1 q4 prn    Singulair 10 Mg  Tabs (Montelukast sodium) .[name] Kitchen... 1 by mouth daily  Problem # 2:  DEPRESSION, MAJOR, RECURRENT (ICD-296.30) Will continue trazadone and fluoxetine   Problem # 3:  OBESITY, NOS (ICD-278.00) pt has lost weight will follow up outpt.   Problem # 4:  FEN/GI SLIV, regular diet  Problem # 5:  PPX heparin and protonix  Problem # 6:  Dispo Pending clincial improvement  Complete Medication List: 1)  Albuterol 90 Mcg/act Aers (Albuterol) .... 2 puffs every 4 hours as needed wheeze 2)  Advair Diskus 500-50 Mcg/dose Misc (Fluticasone-salmeterol) .... Take one (1) inhalation twice a day 3)  Albuterol Sulfate (5 Mg/ml) 0.5% Nebu (Albuterol sulfate) .[name] Kitchen.. 1 q4 prn 4)  Singulair 10 Mg Tabs (Montelukast sodium) .[name] Kitchen.. 1 by mouth daily 5)  Miralax Powd (Polyethylene glycol 3350) .[name] KitchenMarland KitchenMarland Kitchen 17 g in 8 ozs fluid one to two times per day for constipation; dispense one large bottle 6)  Diclofenac Sodium 75 Mg Tbec (Diclofenac sodium) .[name] Kitchen.. 1 tab  by mouth two times a day as needed pain 7)  Claritin 10 Mg Tabs (Loratadine) .... One tablet daily- otc 8)  Fluoxetine Hcl 20 Mg Caps (Fluoxetine hcl) .... Take one tablet daily 9)  Trazodone Hcl 50 Mg Tabs (Trazodone hcl) .... Take 1/2 to one tablet before bedtime for sleep

## 2010-11-24 NOTE — Assessment & Plan Note (Signed)
Summary: hfu,df  Pt unable to wait any longer due to job interview.  Pt sts she is feeling fine and has all her meds.  Rescheduled for 2.1.11 with instructions to call back with any problems. ............................................... Shanda Bumps [facility] November 09, 2009 11:30 AM   Vital Signs:  Patient profile:   50 year old female Menstrual status:  perimenopausal Height:      66 inches Weight:      194 pounds BMI:     31.43 BSA:     1.98 O2 Sat:      98 % on Room air Temp:     98.1 degrees F Pulse rate:   90 / minute BP sitting:   135 / 88  Vitals Entered By: Jone Baseman CMA (November 09, 2009 10:45 AM)  O2 Flow:  Room air CC: hfu Is Patient Diabetic? No Pain Assessment Patient in pain? no        CC:  hfu.  Habits & Providers  Alcohol-Tobacco-Diet     Tobacco Status: never  Allergies: 1)  ! * Peanuts   Complete Medication List: 1)  Albuterol 90 Mcg/act Aers (Albuterol) .... 2 puffs every 4 hours as needed wheeze 2)  Advair Diskus 500-50 Mcg/dose Misc (Fluticasone-salmeterol) .... Take one (1) inhalation twice a day 3)  Albuterol Sulfate (5 Mg/ml) 0.5% Nebu (Albuterol sulfate) .[name] Kitchen.. 1 q4 prn 4)  Singulair 10 Mg Tabs (Montelukast sodium) .[name] Kitchen.. 1 by mouth daily  Other Orders: Pulse Oximetry- [facility] (94760) No Charge Patient Arrived (NCPA0) (NCPA0)

## 2010-11-24 NOTE — Progress Notes (Signed)
Summary: Emergency Line Call  Patient with PMHx asthma calling because her asthma is "bad but not bad enough to come to the ER." She requests Prednisone. She is currently taking Advair 500/50 two times a day, Albuterol neb two times a day-three times a day, and Benadryl. She is speaking in full sentences on the phone with mild coughing. No RED FLAGS. Has appt Monday. Advised patient to stop Benadryl, take Zyrtec/Claritin, start Singulair as soon as she can through [facility] (unable to afford at this time), and increase Albuterol to q 4 hours. Explained to patient that we cannot Rx medications over the phone. RED FLAGS given for promptly seeking medical attention. Patient voiced understanding. Helane Rima DO  February 05, 2010 5:04 PM

## 2010-11-24 NOTE — Letter (Signed)
Summary: Results Follow-up Letter  [facility]  [address]   [place], [place] [address]   Phone: [phone]  Fax: [phone]    10/29/2009  [address] [place], [place]  [address]  Dear Ms. Corlett,   The following are the results of your recent test(s):  Test     Result     Pap Smear    Normal__X_____  Not Normal_____        Your tests for gonorrhea, chlamydia, syphilils, and HIV are normal.  Your hemoglobin was also normal.  I would continue to take daily multivitamins daily.  Please give [place] a call if you have any further questions.   Sincerely,  Delbert Harness MD [facility]           Appended Document: Results Follow-up Letter mailed.

## 2010-11-24 NOTE — Progress Notes (Signed)
  Phone Note Call from Patient   Caller: Patient Summary of Call: "unable to breathe on and off for a week." not taking advair because of cost. up to albuterol every 2-3 hours. does not sound dyspneic on phone but is actively coughing. recommended eval at urgent care. she was agreeable.  Initial call taken by: Lequita Asal  MD,  Mar 20, 2010 3:07 PM

## 2010-11-24 NOTE — Letter (Signed)
Summary: Out of Work  [facility]  [address]   [place], [place] [address]   Phone: [phone]  Fax: [phone]    March 25, 2010   Employee:  STEPANIE [employer]    To Whom It May Concern:   For Medical reasons, please excuse the above named employee from work for the following dates:  Start:   Mar 20, 2010  End:   March 28, 2010  If you need additional information, please feel free to contact our office.         Sincerely,    Jamie Brookes MD  Appended Document: Out of Work faxed to phone number in previous note

## 2010-11-24 NOTE — Letter (Signed)
Summary: Lipid Letter  [facility]  [address]   [place], [place] [address]   Phone: [phone]  Fax: [phone]    08/31/2010  Lydia Griffin [address] [place], [place]  [address]  Dear Lydia Griffin:  We have carefully reviewed your last lipid profile from 08/30/2010 and the results are noted below with a summary of recommendations for lipid management.  Your other labwork which included yoru TSH (thyroid) and CMET (electrolytes, kidney, and liver enzymes) were essentially normal.    Cholesterol:       198     Goal: <200   HDL "good" Cholesterol:   61     Goal: >40   LDL "bad" Cholesterol:   124     Goal: <160   Triglycerides:       66     Goal: <150     TLC Diet (Therapeutic Lifestyle Change): Saturated Fats & Transfatty acids should be kept < 7% of total calories ***Reduce Saturated Fats Polyunstaurated Fat can be up to 10% of total calories Monounsaturated Fat Fat can be up to 20% of total calories Total Fat should be no greater than 25-35% of total calories Carbohydrates should be 50-60% of total calories Protein should be approximately 15% of total calories Fiber should be at least 20-30 grams a day ***Increased fiber may help lower LDL Total Cholesterol should be < 200mg /day Consider adding plant stanol/sterols to diet (example: Benacol spread) ***A higher intake of unsaturated fat may reduce Triglycerides and Increase HDL    Adjunctive Measures (may lower LIPIDS and reduce risk of Heart Attack) include: Aerobic Exercise (20-30 minutes 3-4 times a week) Limit Alcohol Consumption Weight Reduction Aspirin 75-81 mg a day by mouth (if not allergic or contraindicated) Dietary Fiber 20-30 grams a day by mouth     Current Medications: 1)    Albuterol 90 Mcg/act Aers (Albuterol) .... 2 puffs every 4 hours as needed wheeze 2)    Advair Diskus 500-50 Mcg/dose Misc (Fluticasone-salmeterol) .... Take one (1) inhalation twice a day 3)    Albuterol  Sulfate (5 Mg/ml) 0.5%  Nebu (Albuterol sulfate) .[name] Kitchen.. 1 q4 prn 4)    Singulair 10 Mg Tabs (Montelukast sodium) .[name] Kitchen.. 1 by mouth daily 5)    Claritin 10 Mg Tabs (Loratadine) .... One tablet daily- otc 6)    Trazodone Hcl 50 Mg Tabs (Trazodone hcl) .... Take 1/2 to one tablet before bedtime for sleep 7)    Multivitamins  Tabs (Multiple vitamin) .... Take one tablet daily  If you have any questions, please call. We appreciate being able to work with you.   Sincerely,    [facility] Lydia Harness MD  Appended Document: Lipid Letter mailed

## 2010-11-24 NOTE — Initial Assessments (Signed)
Summary: PGY3 addendum   Vital Signs:  Patient profile:   50 year old female Menstrual status:  perimenopausal O2 Sat:      93 % on 2 L/min Temp:     98.0 degrees F Pulse rate:   110 / minute Pulse rhythm:   regular Resp:     30 per minute BP supine:   140 / 84  O2 Flow:  2 L/min  Primary Care Provider:  Delbert Harness MD   History of Present Illness: See H&P by Dr. Gwendolyn Grant for full details. In brief, patient is a 50 y/o female with history of moderate persistent asthma who presents with worsening dyspnea x2 days. patient states she ran out of advair and was unable to obtain 2/2 cost. she noticed progressive worsening dyspnea and wheezing, with increasing albuterol use up to 2 puffs every 2-3 hours without improvement. She was evaluated at urgent care earlier today, and did not have any improvement after steroids and nebulizer treatment. She was transferred to ED, and despite slight improvement, remained dyspneic with an oxygen requirement after a continuous albuterol nebulizer treatment. Patient denies fever, chest pain, chills, sick contacts, rhinorrhea. some intermittent nonproductive cough and back pain.   Allergies: 1)  ! * Peanuts  Physical Exam  General:  middle aged female, mild respiratory distress. awake and alert; noticeable tremor/shuddering Eyes:  EOMI, PERRLA, no conjunctival injection Ears:  hearing grossly normal Nose:  no rhinorrhea Mouth:  MMM Neck:  No deformities, masses, or tenderness noted. Lungs:  diffuse expiratory wheezes and rhonchi bilaterally. increased work of breathing, but no accessory muscle use. dyspneic with short sentences and sitting up in bed.  Heart:  tachycardic; regular rhythm.  Abdomen:  soft, NT, ND, +BS Extremities:  no clubbing/cyanosis/edema Neurologic:  nonfocal Skin:  no rashes Additional Exam:  Na 135, K 3.1, Cl 106, CO2 21, Gluc 276, BUN 8, Cr 1, Ca 8.9 WBC 7.2, Hgb 13.5, Hct 39, Plt 288 CXR: Stable mild chronic lung disease.  No  acute cardiopulmonary  process.   Impression & Recommendations:  Problem # 1:  ASTHMA, WITH ACUTE EXACERBATION (ICD-493.92) given tachycardia and jitteriness, will change to xopenex nebs. continue prednisone. no antibiotics needed. supplemental oxygen as needed. continue advair, singulair, and claritin.   Her updated medication list for this problem includes:    Albuterol 90 Mcg/act Aers (Albuterol) .[name] Kitchen... 2 puffs every 4 hours as needed wheeze    Advair Diskus 500-50 Mcg/dose Misc (Fluticasone-salmeterol) .[name] Kitchen... Take one (1) inhalation twice a day    Albuterol Sulfate (5 Mg/ml) 0.5% Nebu (Albuterol sulfate) .[name] Kitchen... 1 q4 prn    Singulair 10 Mg Tabs (Montelukast sodium) .[name] Kitchen... 1 by mouth daily  Problem # 2:  DEPRESSION, MAJOR, RECURRENT (ICD-296.30) continue trazodone and fluoxetine.   Problem # 3:  HYPOKALEMIA, MILD (ICD-276.8) likely 2/2 albuterol. repleted in ED with 40 meq of KCl. will monitor.   Problem # 4:  Disposition pending clinical improvement, titration off oxygen.

## 2010-11-24 NOTE — Progress Notes (Signed)
  Phone Note Outgoing Call   Call placed by: Asher Muir MD,  January 24, 2010 9:39 AM Summary of Call: tried to call pt to discuss lab results, but phone number was not working.    cmet normal, but wet prep consistent with bacterial vaginosis (this result came back after she left friday afternoon).  I added the metronidazole to her med list, but need to talk with pt to find out which pharmacy it should be sent to.  If she calls about lab results, would you send this script to the pharmacy for her?  Many thanks Initial call taken by: Asher Muir MD,  January 24, 2010 9:41 AM  Follow-up for Phone Call        Called number in last phone message and left message fotr pt to return call Follow-up by: Gladstone Pih,  January 24, 2010 9:45 AM  Additional Follow-up for Phone Call Additional follow up Details #1::        pt returned call [phone]. Additional Follow-up by: Clydell Hakim,  January 24, 2010 10:12 AM    spoke with pt and gave her results and [facility] uses the [facility] [address].Loralee Pacas CMA  January 24, 2010 11:43 AM  Appended Document:  called rx in to [facility] [address].[name] Kitchen

## 2010-11-24 NOTE — Assessment & Plan Note (Signed)
Summary: PPD  Nurse Visit   Allergies: 1)  ! * Peanuts  Immunizations Administered:  PPD Skin Test:    Vaccine Type: PPD    Site: left forearm    Mfr: Sanofi Pasteur    Dose: 0.1 ml    Route: ID    Given by: Theresia Lo RN    Exp. Date: 04/28/2012    Lot #: [id number]  Orders Added: 1)  TB Skin Test [86580] 2)  Admin 1st Vaccine [phone]

## 2010-12-23 ENCOUNTER — Ambulatory Visit (INDEPENDENT_AMBULATORY_CARE_PROVIDER_SITE_OTHER): Payer: Self-pay | Admitting: Family Medicine

## 2010-12-23 ENCOUNTER — Encounter: Payer: Self-pay | Admitting: Family Medicine

## 2010-12-23 DIAGNOSIS — R002 Palpitations: Secondary | ICD-10-CM

## 2010-12-23 DIAGNOSIS — E663 Overweight: Secondary | ICD-10-CM

## 2010-12-23 DIAGNOSIS — F339 Major depressive disorder, recurrent, unspecified: Secondary | ICD-10-CM

## 2010-12-23 DIAGNOSIS — J45909 Unspecified asthma, uncomplicated: Secondary | ICD-10-CM

## 2010-12-23 NOTE — Progress Notes (Signed)
  Subjective:    Patient ID: Lydia Griffin, female    DOB: 1961-04-06, 50 y.o.   MRN: [id number]  HPI Wt Readings from Last 3 Encounters:  12/23/10 177 lb (80.287 kg)  08/09/10 183 lb (83.008 kg)  02/07/10 178 lb (80.74 kg)  Feels she is not losing weight despite walking several times a week.  She feels she is losing muscle mass as she feels "softer"  Would like to discuss what to do.  Asthma:  Has not needed albuterol regularly.  Is taking ICS but not singulair due to cost but plans on getting back on it.  No dyspnea- able to exercise without inhaler most times.  Not limiting exercise.  Palpitations:  Fast, irregular heartbeat "just a flutter" lasting one or two beats.  No chest pain, dyspnea.  Notes + caffeine use.  Review of Systems  Constitutional: Negative for activity change and unexpected weight change.  HENT: Negative for hearing loss and congestion.   Respiratory: Positive for cough. Negative for wheezing.   Cardiovascular: Positive for palpitations.       Objective:   Physical ExamGEN: Alert & Oriented, No acute distress CV:  Regular Rate & Rhythm, no murmur Respiratory:  Normal work of breathing, CTAB Abd:  + BS, soft, no tenderness to palpation Ext: no pre-tibial edema         Assessment & Plan:

## 2010-12-23 NOTE — Patient Instructions (Addendum)
Schedule for annual physical exam- will do your anunal exam and prevention then Make appt for fasting labwork- we will check yoru liver enzymes and cholesterol then I think it is safe for you to add weight bearing exercise to your regimen.  It will help with weight loss, muscle building and bone strength Info given for SVT See Info for Dr. Pascal Lux

## 2010-12-25 DIAGNOSIS — R002 Palpitations: Secondary | ICD-10-CM | POA: Insufficient documentation

## 2010-12-25 NOTE — Assessment & Plan Note (Signed)
Likely SVT, just had normal labwork NOV 2011 which included TSH.  Gave patient education handout, advised limiting caffeine intake and return if worsens or changes.

## 2010-12-25 NOTE — Assessment & Plan Note (Signed)
Does not want pharmacological treatment.  Using low dose trazodone prn for sleep.  Gave her info for Dr. Pascal Lux as patient stated she would be interested in therapy.

## 2010-12-25 NOTE — Assessment & Plan Note (Signed)
Will refill advair, albuterl, singulair.  Advised on close monitoring and to return with signs of exacerbation as spring has historically been difficult for patient and has had multiple hospital admissions in past.

## 2010-12-25 NOTE — Assessment & Plan Note (Signed)
Discussed not letting asthma limit physical activity and if she feels it is, then then we will work to control asthma better rather than limit activity.  Advised to continue to increase aerobic activity but emphasized importance of weight bearing activity to maintain muscle, enhance weight loss, and for bone health.  Patient considering start program at [facility]

## 2011-01-02 LAB — COMPREHENSIVE METABOLIC PANEL WITH GFR
Albumin: 3.7 g/dL (ref 3.5–5.2)
Alkaline Phosphatase: 177 U/L — ABNORMAL HIGH (ref 39–117)
BUN: 6 mg/dL (ref 6–23)
CO2: 23 meq/L (ref 19–32)
Chloride: 102 meq/L (ref 96–112)
GFR calc non Af Amer: 46 mL/min — ABNORMAL LOW (ref >60)
Glucose, Bld: 112 mg/dL — ABNORMAL HIGH (ref 70–99)
Potassium: 3.6 meq/L (ref 3.5–5.1)
Total Bilirubin: 0.9 mg/dL (ref 0.3–1.2)

## 2011-01-02 LAB — COMPREHENSIVE METABOLIC PANEL
ALT: 167 U/L — ABNORMAL HIGH (ref 0–35)
AST: 209 U/L — ABNORMAL HIGH (ref 0–37)
Calcium: 9.1 mg/dL (ref 8.4–10.5)
Creatinine, Ser: 1.24 mg/dL — ABNORMAL HIGH (ref 0.4–1.2)
GFR calc Af Amer: 55 mL/min — ABNORMAL LOW (ref >60)
Sodium: 136 mEq/L (ref 135–145)
Total Protein: 7.6 g/dL (ref 6.0–8.3)

## 2011-01-02 LAB — LIPASE, BLOOD: Lipase: 22 U/L (ref 11–59)

## 2011-01-02 LAB — URINALYSIS, ROUTINE W REFLEX MICROSCOPIC
Glucose, UA: NEGATIVE mg/dL
Ketones, ur: 15 mg/dL — AB
Nitrite: POSITIVE — AB
Protein, ur: 30 mg/dL — AB
Specific Gravity, Urine: 1.016 (ref 1.005–1.030)
Urobilinogen, UA: 2 mg/dL — ABNORMAL HIGH (ref 0.0–1.0)
pH: 6 (ref 5.0–8.0)

## 2011-01-02 LAB — URINE CULTURE
Colony Count: >=100000
Culture  Setup Time: 201112100323

## 2011-01-02 LAB — CBC
HCT: 41.6 % (ref 36.0–46.0)
Hemoglobin: 14.4 g/dL (ref 12.0–15.0)
MCH: 28.8 pg (ref 26.0–34.0)
MCHC: 34.6 g/dL (ref 30.0–36.0)
MCV: 83.2 fL (ref 78.0–100.0)
Platelets: 257 K/uL (ref 150–400)
RBC: 5 MIL/uL (ref 3.87–5.11)
RDW: 13.6 % (ref 11.5–15.5)
WBC: 11.4 K/uL — ABNORMAL HIGH (ref 4.0–10.5)

## 2011-01-02 LAB — PREGNANCY, URINE: Preg Test, Ur: NEGATIVE

## 2011-01-02 LAB — DIFFERENTIAL
Basophils Absolute: 0 K/uL (ref 0.0–0.1)
Basophils Relative: 0 % (ref 0–1)
Eosinophils Absolute: 0 10*3/uL (ref 0.0–0.7)
Eosinophils Relative: 0 % (ref 0–5)
Lymphocytes Relative: 15 % (ref 12–46)
Lymphs Abs: 1.7 10*3/uL (ref 0.7–4.0)
Monocytes Absolute: 1.7 K/uL — ABNORMAL HIGH (ref 0.1–1.0)
Monocytes Relative: 15 % — ABNORMAL HIGH (ref 3–12)
Neutro Abs: 8 K/uL — ABNORMAL HIGH (ref 1.7–7.7)
Neutrophils Relative %: 70 % (ref 43–77)

## 2011-01-02 LAB — URINE MICROSCOPIC-ADD ON

## 2011-01-06 LAB — DIFFERENTIAL
Basophils Absolute: 0 10*3/uL (ref 0.0–0.1)
Eosinophils Relative: 1 % (ref 0–5)
Lymphocytes Relative: 11 % — ABNORMAL LOW (ref 12–46)
Lymphs Abs: 0.8 10*3/uL (ref 0.7–4.0)
Monocytes Absolute: 0.1 10*3/uL (ref 0.1–1.0)
Monocytes Relative: 2 % — ABNORMAL LOW (ref 3–12)
Neutro Abs: 6 10*3/uL (ref 1.7–7.7)

## 2011-01-06 LAB — POCT I-STAT, CHEM 8
BUN: 8 mg/dL (ref 6–23)
Calcium, Ion: 1.18 mmol/L (ref 1.12–1.32)
Chloride: 104 mEq/L (ref 96–112)
Creatinine, Ser: 1.1 mg/dL (ref 0.4–1.2)
Glucose, Bld: 124 mg/dL — ABNORMAL HIGH (ref 70–99)
TCO2: 26 mmol/L (ref 0–100)

## 2011-01-06 LAB — CULTURE, RESPIRATORY W GRAM STAIN

## 2011-01-06 LAB — BASIC METABOLIC PANEL
BUN: 8 mg/dL (ref 6–23)
BUN: 9 mg/dL (ref 6–23)
CO2: 25 mEq/L (ref 19–32)
Calcium: 9.5 mg/dL (ref 8.4–10.5)
Chloride: 106 mEq/L (ref 96–112)
Creatinine, Ser: 0.75 mg/dL (ref 0.4–1.2)
Creatinine, Ser: 0.78 mg/dL (ref 0.4–1.2)
GFR calc non Af Amer: >60 mL/min (ref >60)
Glucose, Bld: 102 mg/dL — ABNORMAL HIGH (ref 70–99)
Glucose, Bld: 135 mg/dL — ABNORMAL HIGH (ref 70–99)
Potassium: 3.9 mEq/L (ref 3.5–5.1)

## 2011-01-06 LAB — EXPECTORATED SPUTUM ASSESSMENT W GRAM STAIN, RFLX TO RESP C

## 2011-01-06 LAB — CBC
HCT: 38.9 % (ref 36.0–46.0)
HCT: 39.4 % (ref 36.0–46.0)
Hemoglobin: 13.4 g/dL (ref 12.0–15.0)
MCH: 28.4 pg (ref 26.0–34.0)
MCHC: 33.7 g/dL (ref 30.0–36.0)
MCHC: 34.1 g/dL (ref 30.0–36.0)
MCV: 83.1 fL (ref 78.0–100.0)
MCV: 83.8 fL (ref 78.0–100.0)
MCV: 85.3 fL (ref 78.0–100.0)
Platelets: 258 10*3/uL (ref 150–400)
Platelets: 287 10*3/uL (ref 150–400)
RBC: 4.51 MIL/uL (ref 3.87–5.11)
RDW: 14.7 % (ref 11.5–15.5)
RDW: 14.8 % (ref 11.5–15.5)
WBC: 6.9 10*3/uL (ref 4.0–10.5)
WBC: 8.7 10*3/uL (ref 4.0–10.5)

## 2011-01-06 LAB — BRAIN NATRIURETIC PEPTIDE: Pro B Natriuretic peptide (BNP): <30 pg/mL (ref 0.0–100.0)

## 2011-01-08 LAB — BASIC METABOLIC PANEL
CO2: 23 mEq/L (ref 19–32)
Calcium: 9.1 mg/dL (ref 8.4–10.5)
Calcium: 9.5 mg/dL (ref 8.4–10.5)
Chloride: 103 mEq/L (ref 96–112)
Creatinine, Ser: 0.97 mg/dL (ref 0.4–1.2)
GFR calc Af Amer: >60 mL/min (ref >60)
GFR calc Af Amer: >60 mL/min (ref >60)
GFR calc non Af Amer: >60 mL/min (ref >60)
Potassium: 4.7 mEq/L (ref 3.5–5.1)
Sodium: 135 mEq/L (ref 135–145)
Sodium: 137 mEq/L (ref 135–145)

## 2011-01-08 LAB — CBC
Hemoglobin: 13.4 g/dL (ref 12.0–15.0)
RBC: 4.56 MIL/uL (ref 3.87–5.11)
WBC: 9.7 10*3/uL (ref 4.0–10.5)

## 2011-01-09 LAB — CBC
HCT: 39 % (ref 36.0–46.0)
MCHC: 34 g/dL (ref 30.0–36.0)
MCHC: 34.5 g/dL (ref 30.0–36.0)
MCV: 87.6 fL (ref 78.0–100.0)
MCV: 87.8 fL (ref 78.0–100.0)
Platelets: 279 10*3/uL (ref 150–400)
Platelets: 288 10*3/uL (ref 150–400)
WBC: 19.5 10*3/uL — ABNORMAL HIGH (ref 4.0–10.5)
WBC: 7.2 10*3/uL (ref 4.0–10.5)

## 2011-01-09 LAB — POCT I-STAT 3, ART BLOOD GAS (G3+)
Bicarbonate: 22.3 mEq/L (ref 20.0–24.0)
O2 Saturation: 90 %
TCO2: 23 mmol/L (ref 0–100)
pCO2 arterial: 36.1 mmHg (ref 35.0–45.0)
pO2, Arterial: 57 mmHg — ABNORMAL LOW (ref 80.0–100.0)

## 2011-01-09 LAB — BASIC METABOLIC PANEL
BUN: 11 mg/dL (ref 6–23)
BUN: 8 mg/dL (ref 6–23)
CO2: 21 mEq/L (ref 19–32)
Chloride: 106 mEq/L (ref 96–112)
Chloride: 108 mEq/L (ref 96–112)
Creatinine, Ser: 0.81 mg/dL (ref 0.4–1.2)
Potassium: 3.1 mEq/L — ABNORMAL LOW (ref 3.5–5.1)

## 2011-01-09 LAB — URINALYSIS, ROUTINE W REFLEX MICROSCOPIC
Bilirubin Urine: NEGATIVE
Specific Gravity, Urine: 1.025 (ref 1.005–1.030)
pH: 5 (ref 5.0–8.0)

## 2011-01-09 LAB — BLOOD GAS, ARTERIAL
Acid-base deficit: 6.6 mmol/L — ABNORMAL HIGH (ref 0.0–2.0)
O2 Content: 2 L/min
pCO2 arterial: 31.8 mmHg — ABNORMAL LOW (ref 35.0–45.0)
pH, Arterial: 7.366 (ref 7.350–7.400)

## 2011-01-09 LAB — URINE MICROSCOPIC-ADD ON

## 2011-01-09 LAB — DIFFERENTIAL
Eosinophils Absolute: 0 10*3/uL (ref 0.0–0.7)
Eosinophils Relative: 0 % (ref 0–5)
Lymphs Abs: 0.4 10*3/uL — ABNORMAL LOW (ref 0.7–4.0)
Monocytes Relative: 0 % — ABNORMAL LOW (ref 3–12)

## 2011-01-09 LAB — HEMOGLOBIN A1C: Hgb A1c MFr Bld: 5.3 % (ref <5.7)

## 2011-01-09 LAB — MRSA PCR SCREENING: MRSA by PCR: NEGATIVE

## 2011-01-26 ENCOUNTER — Encounter: Payer: Self-pay | Admitting: Family Medicine

## 2011-01-26 ENCOUNTER — Ambulatory Visit (INDEPENDENT_AMBULATORY_CARE_PROVIDER_SITE_OTHER): Payer: Self-pay | Admitting: Family Medicine

## 2011-01-26 VITALS — BP 130/81 | HR 101 | Temp 100.2°F | Ht 66.0 in | Wt 173.0 lb

## 2011-01-26 DIAGNOSIS — R112 Nausea with vomiting, unspecified: Secondary | ICD-10-CM | POA: Insufficient documentation

## 2011-01-26 DIAGNOSIS — J45909 Unspecified asthma, uncomplicated: Secondary | ICD-10-CM

## 2011-01-26 DIAGNOSIS — R197 Diarrhea, unspecified: Secondary | ICD-10-CM | POA: Insufficient documentation

## 2011-01-26 MED ORDER — IPRATROPIUM BROMIDE 0.02 % IN SOLN
0.5000 mg | Freq: Once | RESPIRATORY_TRACT | Status: AC
  Administered 2011-01-26: 0.5 mg via RESPIRATORY_TRACT

## 2011-01-26 MED ORDER — PROMETHAZINE HCL 25 MG/ML IJ SOLN
25.0000 mg | Freq: Once | INTRAMUSCULAR | Status: AC
  Administered 2011-01-26: 25 mg via INTRAMUSCULAR

## 2011-01-26 MED ORDER — PREDNISONE 10 MG PO TABS: 40.0000 mg | ORAL_TABLET | Freq: Every day | ORAL | Status: AC

## 2011-01-26 MED ORDER — PROMETHAZINE HCL 12.5 MG RE SUPP: 12.5000 mg | Freq: Four times a day (QID) | RECTAL | Status: AC | PRN

## 2011-01-26 MED ORDER — ALBUTEROL SULFATE (2.5 MG/3ML) 0.083% IN NEBU
2.5000 mg | INHALATION_SOLUTION | Freq: Once | RESPIRATORY_TRACT | Status: AC
  Administered 2011-01-26: 2.5 mg via RESPIRATORY_TRACT

## 2011-01-26 MED ORDER — METHYLPREDNISOLONE SODIUM SUCC 125 MG IJ SOLR
125.0000 mg | Freq: Once | INTRAMUSCULAR | Status: AC
  Administered 2011-01-26: 125 mg via INTRAMUSCULAR

## 2011-01-26 NOTE — Progress Notes (Signed)
  Subjective:    Patient ID: Lydia Griffin, female    DOB: 06/30/1961, 50 y.o.   MRN: [id number]  HPI  Asthma: ANA WOODROOF presents for evaluation of asthma. Patient's symptoms include dyspnea, productive cough and wheezing. Associated symptoms include chills, fever, productive cough with sputum described as green and rhinorrhea some body aches.. The patient has been suffering from these symptoms for approximately 3 day. Symptoms have been unchanged since their onset. Medications used in the past to treat these symptoms include beta agonist inhalers. Suspected precipitants include upper respiratory infection. Patient was not awoken from sleep due to wheezing. Patient has required Emergency Room treatment for these symptoms, and has required hospitalization. The patient has not been intubated in the past.  She tried using her nebulizer at home but it did not resolve her wheezing.  She feels that she cannot catch her breath.  Her O2 sat today is 96% on room air.  She did not use Advair today.  She is not taking Singulair due to cost.     Nausea and Vomiting: Started early yesterday.  Today she had vomiting, which was nonbloody.  She is not sure how often she has vomited. Nausea is worse with sitting up.  She may have some dizziness (spinning room) if she sits up to quickly.  She denies abd pain or diarrhea.  She tried to eat some crackers at home today, but she ended up vomiting it up.  She has tried to keep drinking fluid, but has been only mildly successful. ? Fever, +chills, no strange /new foods, no travel , some abd pain from vomiting. She lives alone, but her sister drove her here today.   Review of Systems Per hpi     Objective:   Physical Exam GEN: moderate distress, pt is sitting but leaning forward to catch her breath.  Pt able to speak in complete sentences.  RESP: diffuse wheezing, good air movements CVS: Tachy, otherwise regular rhythm, no murmurs ABD: soft, hypoactive BS, no  guarding, no masses, no rebound EXT: no edema       Assessment & Plan:

## 2011-01-26 NOTE — Patient Instructions (Signed)
Dehydration   Dehydration is the reduction of water and fluid from the body to a level below that required for proper functioning.   CAUSES   Dehydration occurs when there is excessive fluid loss from the body or when loss of normal fluids is not adequately replaced.   Loss of fluids occurs in vomiting, diarrhea, excessive sweating, excessive urine output, or excessive loss of fluid from the lungs (as occurs in fever or in patients on a ventilator).   Inadequate fluid replacement occurs with nausea or decreased appetite due to illness, sore throat, or mouth pain.   SYMPTOMS   Mild dehydration   Thirst (infants and young children may not be able to tell you they are thirsty).   Dry lips.   Slightly dry mouth membranes.   Moderate dehydration   Very dry mouth membranes.   Sunken eyes.   Sunken soft spot (fontanelle) on infant's head.   Skin does not bounce back quickly when lightly pinched and released.   Decreased urine production.   Decreased tear production.   Severe dehydration   Rapid, weak pulse (more than 100 beats per minute at rest).   Cold hands and feet.   Loss of ability to sweat in spite of heat and temperature.   Rapid breathing.   Blue lips.   Confusion, lethargy, difficult to arouse.   Minimal urine production.   No tears.   DIAGNOSIS   Your caregiver will diagnose dehydration based on your symptoms and your exam. Blood and urine tests will help confirm the diagnosis. The diagnostic evaluation should also identify the cause of dehydration.   PREVENTION   The body depends on a proper balance of fluid and salts (electrolytes) for normal function. Adequate fluid intake in the presence of illness or other stresses (such as extreme exercise) is important.   TREATMENT   Mild dehydration is safe to self-treat for most ages as long as it does not worsen. Contact your caregiver for even mild dehydration in infants and the elderly.   In teenagers and adults with moderate dehydration, careful home treatment (as  outlined below) can be safe. Phone contact with a caregiver is advised. Children under 10 years of age with moderate dehydration should see a caregiver.   If you or your child is severely dehydrated, go to a hospital for treatment. Intravenous (IV) fluids will quickly reverse dehydration and are often lifesaving in young children, infants, and elderly persons.   HOME CARE INSTRUCTIONS   Small amounts of fluids should be taken frequently. Large amounts at one time may not be tolerated. Plain water may be harmful in infants and the elderly. Oral rehydration solutions (ORS) are available at pharmacies and grocery stores. ORS replaces water and important electrolytes in proper proportions. Sports drinks are not as effective as ORS and may be harmful because the sugar can make diarrhea worse.   As a general guideline for children, replace any new fluid losses from diarrhea and/or vomiting with ORS as follows:   If your child weighs 22 pounds or under (10 kg or less), give 60-120 mL (1/4-1/2 cup or 2-4 ounces) of ORS for each diarrheal stool or vomiting episode.   If your child weighs more than 22 pounds (more than 10 kg), give 120-240 mL (1/2-1 cup or 4-8 ounces) of ORS for each diarrheal stool or vomiting episode.  If your child is vomiting, it may be helpful to give the above ORS replacement in 5 mL (1 teaspoon) amounts every 5 minutes   and increase as tolerated.   While correcting for dehydration, children should eat normally. However, foods high in sugar should be avoided because they may worsen diarrhea. Large amounts of carbonated soft drinks, juice, gelatin desserts, and other highly sugared drinks should be avoided.   After correction of dehydration, other liquids that are appealing to the child may be added. Children should drink small amounts of fluids frequently and fluids should be increased as tolerated. Children should drink enough fluids to keep urine clear or pale yellow.  Adults should eat normally while  drinking more fluids than usual. Drink small amounts of fluids frequently and increase the amount as tolerated. Drink enough fluids to keep urine clear or pale yellow. Broths, weak decaffeinated tea, lemon-lime soft drinks (allowed to go flat), and ORS replace fluids and electrolytes.   Avoid:   Carbonated drinks.   Juice.   Extremely hot or cold fluids.   Caffeine drinks.   Fatty, greasy foods.  Alcohol.   Tobacco.   Too much intake of anything at one time.   Gelatin desserts.    Probiotics are active cultures of beneficial bacteria. They may lessen the amount and number of diarrheal stools in adults. Probiotics can be found in yogurt with active cultures and in supplements.   Wash your hands well to avoid spreading germs (bacteria) and viruses.   Antidiarrheal medicines are not recommended for infants and children.   Only take over-the-counter or prescription medicines for pain, discomfort, or fever as directed by your caregiver. Do not give aspirin to children.   For adults with dehydration, ask your caregiver if you should continue all prescribed and over-the-counter medicines.   If your caregiver has given you a follow-up appointment, it is very important to keep that appointment. Not keeping the appointment could result in a lasting (chronic) or permanent injury and disability. If there is any problem keeping the appointment, you must call to reschedule.   SEEK IMMEDIATE MEDICAL CARE IF:   You are unable to keep fluids down or other symptoms become worse despite treatment.   Vomiting or diarrhea develops and becomes persistent.   There is vomiting of blood or green matter (bile).   There is blood in the stool or the stools are black and tarry.   There is no urine output in 6 to 8 hours or there is only a small amount of very dark urine.   Abdominal pain develops, increases, or localizes.   You or your child has an oral temperature above 101, not controlled by medicine.   Your baby is older than 3 months with a  rectal temperature of 102.0F (38.9 C) or higher.   Your baby is 3 months old or younger with a rectal temperature of 100.4 F (38 C) or higher.   You develop excessive weakness, dizziness, fainting, or extreme thirst.   You develop a rash, stiff neck, severe headache, or you become irritable, sleepy, or difficult to awaken.   MAKE SURE YOU:   Understand these instructions.   Will watch your condition.   Will get help right away if you are not doing well or get worse.   Document Released: 10/09/2005 Document Re-Released: 01/03/2010   [employer] Patient Information 2011 [employer].

## 2011-01-28 ENCOUNTER — Encounter: Payer: Self-pay | Admitting: Family Medicine

## 2011-01-28 NOTE — Assessment & Plan Note (Addendum)
?  Gastroenteritis etiology.  Pt does not have history of cyclic vomiting.  Pt was given Phenergan in the clinic, after which she was able to drink water without vomiting.  We discussed the importance of maintaining adequate fluid status.  Pt would like to go home and she prefers Phenergan suppository vs ODT.  If vomiting is worse at home and she cannot keep up her fluids, pt will go to ER.

## 2011-01-28 NOTE — Assessment & Plan Note (Signed)
Asthma exacerbation, likely from viral source.  We gave pt a neb treatment in the office.  Afterwards her breathing became less labored, and lungs had less wheezing.  Will treat with Prednisone x 5 days.  Advised to not use Advair while taking Prednisone.

## 2011-03-07 NOTE — H&P (Signed)
NAMEVANDY, Lydia Griffin NO.:  [id number]   MEDICAL RECORD NO.:  [id number]          PATIENT TYPE:  INP   LOCATION:  2311                         FACILITY:  [facility]   PHYSICIAN:  Pearlean Brownie, M.D.DATE OF BIRTH:  1961/03/16   DATE OF ADMISSION:  07/21/2008  DATE OF DISCHARGE:                              HISTORY & PHYSICAL   PRIMARY CARE Semisi Biela:  Ruthe Mannan, MD at [facility].   CHIEF COMPLAINT:  Wheezing.   HISTORY OF PRESENT ILLNESS:  The patient is a 50 year old female with  history of asthma who has been hospitalized many times in the past for  asthma exacerbation and has required intubation one time for this.  She  presents today with a 1-day course of worsening wheezing and shortness  of breath.  The patient recently returned to [place] on Friday  after being in [place] for the past 4 months for school.  The patient  had a positive sick contact on return and was around a lot of kids.  On  Monday morning, she noticed a sore throat, but denied any cough, fever,  or sputum production.  She then began wheezing with shortness of breath.  Her wheezing progressively worsened.  She has an appointment at [facility] tomorrow, but could not wait till then, so she came into  the ED.  In the ED, the patient was put on continuous nebulizer with  initial pulse ox of 87.  Her breathing improved after that.  The patient  states that she has been taking all of her asthma medications.  She has  only been taking her Advair every other day as opposed to twice a day.  She denies any leg pain, pleuritic chest pain, vomiting, diarrhea.  She  endorses headaches and palpitations.   MEDICATIONS:  1. Albuterol inhaler use as needed  2. Advair 1 inhalation every other day.  3. Singulair 10 mg 1 tablet by mouth daily.  4. Zyrtec 10 mg 1 tablet by mouth daily as needed for allergies.   CURRENT ALLERGIES:  PEANUTS.   PAST MEDICAL  HISTORY:  1. Normal spontaneous vaginal delivery x3.  2. Multiple previous hospitalizations secondary to asthma requiring      intubation once.   PAST SURGICAL HISTORY:  None.   FAMILY HISTORY:  Asthma in father, sister, and mother.  Sister died from  asthma exacerbation.  Alcohol abuse in the mother.  The patient denies  any history of diabetes mellitus.   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  She has a 91 year old son, 57  and 57 year old daughters that live independently.  The patient has  close relationship with them.  The patient denies any alcohol or tobacco  or illicit drug use.   REVIEW OF SYSTEMS:  Please see HPI.   PHYSICAL EXAMINATION:  VITAL SIGNS:  Temperature 99.0, pulse 97,  respiratory rate 24, blood pressure 117/77, and pulse ox 87% on  continuous neb.  GENERAL:  Alert and overweight appearing in  mild respiratory distress on  continuous neb.  HEENT:  Head normocephalic and atraumatic.  Eyes, vision grossly intact.  NOSE:  No external ear deformity.  No nasal discharge.  LUNGS:  Increased work of breathing.  Expiratory wheezes throughout all  lung fields.  Some accessory muscle use.  HEART:  Tachycardic.  No murmurs, rubs, or gallops.  ABDOMEN:  Obese, soft, and nontender.  Normal bowel sounds.  EXTREMITIES:  Pulses, 2+ dorsalis pedis pulses.  No clubbing, cyanosis,  or edema.  NEUROLOGIC:  Alert and oriented x3.   LABS AND STUDIES:  1. CBC, WBC 9.2, hemoglobin 13, and platelets 287.  Chest x-ray,      bronchitic changes, otherwise no acute disease.  2. Venous blood gas, pH 7.343, pCO2 of 48, pO2 of 38.   ASSESSMENT/PLAN:  The patient is a 50 year old female with history of  asthma requiring hospitalization in the past for asthma exacerbation who  presents with an asthma exacerbation.  1. Asthma exacerbation.  The patient has had to be hospitalized in the      past and had to be  intubated, so we will place the patient in step      down unit for now.  Anticipate being able to move her to a regular      room when respiratory status improves.  We will treat her with      another continuous albuterol neb and then give her albuterol nebs      q.2 h., q.1 h. as needed.  We will also start prednisone 60 mg by      mouth daily.  We will continue Advair and make it 2 times a day and      continue Singulair.  We will provide supplemental O2 to keep pulse      ox greater than 90.  We will monitor her respiratory status with      peak expiratory flow rate every 4 hours.  2. Palpitations.  The patient complains of feeling like her heart      skips a beat and she says that this is a new complaint.  This is      most likely due to her albuterol.  We will check an EKG just to      make sure that she does not have an arrhythmia.      Angelena Sole, MD  Electronically Signed      Pearlean Brownie, M.D.  Electronically Signed    WS/MEDQ  D:  07/21/2008  T:  07/21/2008  Job:  [id number]

## 2011-03-07 NOTE — Discharge Summary (Signed)
Lydia Griffin, Lydia Griffin NO.:  [id number]   MEDICAL RECORD NO.:  [id number]          PATIENT TYPE:  INP   LOCATION:  5503                         FACILITY:  [facility]   PHYSICIAN:  Pearlean Brownie, M.D.DATE OF BIRTH:  [birth year]   DATE OF ADMISSION:  01/24/2008  DATE OF DISCHARGE:  01/26/2008                               DISCHARGE SUMMARY   DISCHARGE DIAGNOSES:  1. Status asthmaticus.  2. Upper respiratory infection.  3. Constipation.   DISCHARGE MEDICATIONS:  1. Prednisone 60 mg daily for 5 days.  2. Advair 500/50, 1 puff inhaler twice a day.  3. Singulair 10 mg daily.  4. Zyrtec 10 mg daily.  5. Albuterol 90 mcg, MDI 2 puffs every 4 hours as needed.  6. MiraLax p.r.n. constipation.  7. Prilosec OTC 20 mg 1 tablet daily.  8. Amoxicillin 500 mg 1 tablet 2 times a day for 10 days.  9. May take ibuprofen p.r.n. left chest pain.   FOLLOWUP APPOINTMENT:  The patient is to follow up with Dr. Dayton Martes at the  [facility] on Monday, January 27, 2008.  She will  call for an appointment and we will also make the [facility]  aware that she will be coming in.   CONDITION AT DISCHARGE:  The patient is stable, still was wheezing but  with good breath movements and only requiring her albuterol MDI inhaler  every 4 hours, also has a likely upper respiratory infection.  She is  sating well on room air in the upper 90s.   LABORATORY DATA:  Discharge labs show a white blood cell count of 11.8,  hemoglobin 12.1, hematocrit 35.6, and platelets 315.  One set of cardiac  enzymes done was completely negative, troponin 0.01.  BMP, sodium 138,  potassium 3.8, chloride 107, bicarb 25, glucose 103, BUN 7, creatinine  0.78, and calcium 9.6.  CBC on admission showed a white blood cell count  of 8.0, hemoglobin 12.0, and platelets 315.  Potassium on admission was  low at 3.0.  Blood gas on admission, pH of 7.441, PCO2 of 27.7, PO2 of  63.0, and bicarb 19.   Chest x-ray done on January 25, 2008, showed no acute  cardiopulmonary abnormality.  Chest x-ray done on January 24, 2008, also  showed chronic lung disease and peribronchial thickening.  No acute  infiltrate.   HOSPITAL COURSE:  The patient is a 50 year old with history of asthma.  She presented as status asthmaticus and is now much improved.  1. Asthma.  The patient presented with status asthmaticus.  She was      started on continuous albuterol treatments and initially admitted      to either subdown or ICU.  She was also started on a course of      prednisone.  It is believed that her exacerbation was due to not      giving her Advair and other medications.  She was recently      transitioned to albuterol neb p.r.n. q.4 h. scheduled.  Atrovent      was avoided due to her  peanut allergy.  She was treated with      Advair, Singulair, and Zyrtec as well as with albuterol nebs      initially, and then transitioned to q.4 h. MDI inhaler at the time      of discharge.  She was also started back on her Singulair.  There      also may be a viral trigger to this as the patient has been more      congested in the past couple of days.  She likely has an upper      respiratory infection.  Social work was consulted while she was      here to assist with medication issues since she is having trouble      affording this.  They did provide her information for medication      assistance program.  I will see if they can give her albuterol      inhaler and Advair inhaler prior to leaving today.  She will be      following up with Dr. Dayton Martes, or as they work in with a different      doctor at the [facility] tomorrow on January 27, 2008, to make sure her asthma is in check.  2. Upper respiratory infection.  The patient currently has a slightly      elevated white blood cell count but has been afebrile, but does      have some purulent nasal secretion.  Given her severe asthma and       recently being in the hospital with status asthmaticus, I feel that      it would be appropriate to treat her with a course of amoxicillin,      just in case this is sinusitis and not just a viral infection that      could benefit from antibiotics.  We will give her a 10-day course.      I decided against other antibiotics given that she did not have      insurance and could only afford something less than 20 dollars at      [employer].  3. Eczema.  The patient was continued on her triamcinolone cream.  4. Abdominal pain/constipation.  The patient was given MiraLax and      also Protonix here in the hospital.  She has no complaints today      and will go home with Prilosec as well as MiraLax.      Alanda Amass, M.D.  Electronically Signed      Pearlean Brownie, M.D.  Electronically Signed    JH/MEDQ  D:  01/26/2008  T:  01/27/2008  Job:  [id number]   cc:   Ruthe Mannan, M.D.

## 2011-03-07 NOTE — Discharge Summary (Signed)
NAMENANAMI, WHITELAW NO.:  [id number]   MEDICAL RECORD NO.:  [id number]          PATIENT TYPE:  INP   LOCATION:  6742                         FACILITY:  [facility]   PHYSICIAN:  Pearlean Brownie, M.D.DATE OF BIRTH:  Jun 07, 1961   DATE OF ADMISSION:  07/21/2008  DATE OF DISCHARGE:  07/23/2008                               DISCHARGE SUMMARY   PRIMARY CARE Bryden Darden:  Ruthe Mannan, MD   A discharge summary for this patient has previously been dictated by Dr.  Angelena Sole, as this patient was supposed to be discharged  yesterday.  Today on July 23, 2008, the patient is feeling well.  She  went 9 hours overnight with no med nebulizer treatments.  She feels like  she is getting a little bit of a viral cold but otherwise feels well and  is ready to go home.  Today, her vital signs are stable.  She is sating  98-100% on room air.  Her physical exam is unremarkable except for  inspiratory and expiratory wheezes common with her condition of s/p  asthma esacerbation.  She has no change in medications.  She is stable  for discharge.   This is a 50 year old female with asthma exacerbation that is resolving.  The patient is maintaining her O2 sats on room air.  She has some viral  URI symptoms that persist such as cough and nasal congestion but feels  like she can deal with these at home.  Plan is to discharge this  afternoon back on her home meds of albuterol, Advair, Singulair, and  Zyrtec recommending that she take them as directed.      Jamie Brookes, MD  Electronically Signed      Pearlean Brownie, M.D.  Electronically Signed    AS/MEDQ  D:  07/23/2008  T:  07/24/2008  Job:  [id number]

## 2011-03-07 NOTE — Discharge Summary (Signed)
NAMEJESSAH, Lydia Griffin NO.:  [id number]   MEDICAL RECORD NO.:  [id number]          PATIENT TYPE:  INP   LOCATION:  6742                         FACILITY:  [facility]   PHYSICIAN:  Pearlean Brownie, M.D.DATE OF BIRTH:  [birth year]   DATE OF ADMISSION:  07/21/2008  DATE OF DISCHARGE:  07/22/2008                               DISCHARGE SUMMARY   PRIMARY CARE Judith Demps:  Dr. Ruthe Mannan at [facility].   DISCHARGE DIAGNOSES:  1. Asthma exacerbation.  2. Upper respiratory infection.   DISCHARGE MEDICATIONS:  1. Albuterol 2.5 mg inhaler, inhale every 4 hours as needed for      wheezing.  2. Advair 500-50 mcg, inhale 1 puff twice a day.  3. Singular 10 mg 1 tablet by mouth daily.  4. Zyrtec 10 mg 1 tablet by mouth daily as needed for allergies.  5. Prednisone 60 mg 1 tablet by mouth for 5 days.  6. Saline nasal spray, use as directed every 4 hours as needed for      congestion.   CONSULTS:  None.   PROCEDURES:  Chest x-ray; bronchitic changes, otherwise no acute  cardiopulmonary disease.   LABORATORY DATA:  1. CBC on July 21, 2008; WBC 9.2, hemoglobin 13.0, hematocrit      38.8, and platelets 287.  2. BMET on July 21, 2008; potassium 5.2, glucose 185, and      creatinine 1.24.  3. BMET on July 22, 2008; sodium 140, potassium 3.7, glucose 111,      and creatinine 0.80.   BRIEF HOSPITAL COURSE:  The patient is a 50 year old female who is  otherwise healthy with a history of asthma exacerbations who presented  to [facility] Emergency Department with an asthma exacerbation, most  likely because of some upper respiratory infection.  1. Asthma exacerbation.  This was thought to be brought on because of      an upper respiratory infection.  The patient was admitted because      of an increased oxygen requirement, increased work of breathing,      and significant wheezing.  The patient was put on continuous      nebulizer inhaler in the  emergency department which seemed to help      the patient.  The patient was initially admitted to a step-down      unit for close observation because she does have a history of      requiring intubation from these exacerbations.  The patient was      given another 1-hour continuous nebulizer treatment as well as      started on prednisone 60 mg daily, Advair twice a day, and      Singulair 10 mg daily.  Of note, the patient was also endorsed not      taking her Advair as often she is supposed to because of the cost      of the medication.  The patient was taking the Advair every other      day as opposed to twice a day.  While in the hospital, the patient  had clinically improved.  Her albuterol nebulizer treatments were      spaced out to every 4 hours which the patient tolerated well.  On      the day of discharge, the patient was breathing comfortably on room      air.  She still had some expiratory wheezes but were much improved      than on admission.  The patient felt that she was breathing much      easier and was ready for discharge.  The other issue with this      patient is her being able to afford medications.  Phone appointment      with [facility] was arranged for the patient.  2. Upper respiratory infection.  On admission, the patient was      complaining of some upper respiratory infection signs including a      sore throat.  While in the hospital, she also developed a cough and      some nasal congestion.  This seems most consistent with a viral      upper respiratory infection which indicates only supportive      management.  The patient was given saline nasal spray for      congestion and was encouraged to drink plenty of fluids and to take      it easy for the next couple of days.  3. Hyperkalemia.  The patient had a slightly elevated potassium of 5.2      on admission which returned to within normal limits on recheck with      no intervention.   DISCHARGE  INSTRUCTIONS:  The patient has no restrictions on her diet.  The patient is to increase her activity slowly.  The patient was  strongly encouraged to keep her appointment at [facility] because she  had an appointment set up after prior hospitalization which she did not  keep.   FOLLOWUP APPOINTMENTS:  The patient is to follow up with Dr. Margarette Canada at  [facility], phone number [phone] on August 03, 2008, at 2 p.m.   DISCHARGE CONDITION:  The patient was discharged home in stable medical  condition.      Angelena Sole, MD  Electronically Signed      Pearlean Brownie, M.D.  Electronically Signed    WS/MEDQ  D:  07/22/2008  T:  07/23/2008  Job:  [id number]   cc:   Dr. Margarette Canada

## 2011-03-10 NOTE — H&P (Signed)
Lydia Griffin, GUNNER NO.:  [id number]   MEDICAL RECORD NO.:  [id number]          PATIENT TYPE:  INP   LOCATION:  1823                         FACILITY:  [facility]   PHYSICIAN:  Pearlean Brownie, M.D.DATE OF BIRTH:  [birth year]   DATE OF ADMISSION:  03/18/2005  DATE OF DISCHARGE:                                HISTORY & PHYSICAL   CHIEF COMPLAINT:  Shortness of breath.   HISTORY OF PRESENT ILLNESS:  The patient is a 50 year old African American  female with a history of asthma with a recent hospitalization at [facility].  She was discharged home but unavailable to afford her  Combivent.  After the discharge, her condition gradually declined.  I never  got all the way better.  The patient noted an increased use of albuterol  over the past two days and increased cough was also noted.  This morning she  noticed significantly increased work of breathing, significantly increased  shortness of breath, and also an analogous increase in her cough.  She did  not have a decrease in symptoms after using albuterol today.  She came to  [facility] Room for evaluation.  Per the  patient, she has good control when she is on Advair, albuterol p.r.n., and  Singulair.  The patient also reports chest pain that is worse with deep  inspiration along the chest wall bilaterally.  In the emergency room, the  patient was given albuterol and Atrovent x 4, prednisone 60 mg p.o. x 1,  ceftriaxone 1 gram IV, and Percocet 5/325 x 1 for chest pain.   PAST MEDICAL HISTORY:  1.  Asthma with a history of intubation as a child.  2.  NSVD x 3.   SOCIAL HISTORY:  She is a Child psychotherapist.  She is from [place].  She has three  children and has a close relationship with all three.  No tobacco.  Uses no  alcohol.  Distant marijuana use.  She lives with her boyfriend.   FAMILY HISTORY:  Father is dead of congestive heart failure, does have a  history of  asthma.  Mother alive, alcohol, and positive history of asthma.  Sister dead of asthma.   MEDICATIONS:  Singulair, albuterol, and Zyrtec.  The patient ran out of  Advair.  She was on this previously.   ALLERGIES:  No known drug allergies.   REVIEW OF SYSTEMS:  No fevers.  No chills.  No sweats.  Positive cough.  No  sputum.  Positive wheeze.  No nausea or vomiting.  No diarrhea.  No  abdominal pain.  No dysuria.  No rash.  No myalgias.  No arthralgias.  No  rhinorrhea.   PHYSICAL EXAMINATION:  VITAL SIGNS:  Temperature 98.1, pulse 83, blood  pressure 142/91, 100% on room air.  GENERAL:  No apparent distress.  HEENT:  Normocephalic, atraumatic.  Pupils are equal, round, and reactive to  light.  Tympanic membrane within normal limits bilaterally.  Mucous  membranes are moist.  No oropharyngeal erythema.  No rhinorrhea noted.  NECK:  Supple.  No lymphadenopathy.  No thyromegaly.  CARDIOVASCULAR:  Regular rate and rhythm.  No murmurs, rubs.  No gallops  appreciated.  PULMONARY:  Positive polyphonic wheezes with occasional rhonchi in all lung  fields.  There is good air movement.  The patient does not have any nasal  flaring.  No accessory muscle use.  ABDOMEN:  Soft, nontender.  Positive bowel sounds.  Nondistended.  EXTREMITIES:  No edema.  Pulses 2+.  SKIN:  No rash.   LABORATORY VALUES:  White count 12.4, hemoglobin 10.9, platelets 382, ANC  12.  Electrolytes:  Sodium 136, potassium 3.7, chloride 109, bicarb 21, BUN  9, creatinine 1.1, calcium 8.8, glucose 132.  ABG:  PH 7.417, pCO2 29, pO2  75, bicarb 18.6, 95%.  A chest x-ray showed peribronchial thickening, an  infiltrate to the right base.   ASSESSMENT/PLAN:  The patient is a 50 year old female with:  1.  Asthma.  We will admit her, provide supplemental oxygen, and continue      albuterol, Atrovent, and prednisone.  We will begin Advair before      discharge.  Not using Advair now since she will be receiving frequent       albuterol but she will likely be discharged on this medication.  We will      get a social work consult to help with discharge medications __________      .  We will begin Motrin for pleuritic chest pain and continue Singulair.      We will begin azithromycin for pneumonia covering for atypical.  The      patient has a 1.7% chance of pulmonary embolism per the __________      criteria, so it is unlikely this is a cause of her symptoms.  2.  Infectious disease.  Rocephin was given in the emergency room.  We will      give azithromycin as above and follow.  3.  Deep vein thrombosis prophylaxis.  Lovenox.  4.  Fluids, electrolytes, nutrition.  Begin a regular diet and Hep-Lock IV.   DISPOSITION:  Follow up with [facility].  She will need an arrangement to have a  primary M.D. there.      GSD/MEDQ  D:  03/19/2005  T:  03/19/2005  Job:  [id number]

## 2011-03-10 NOTE — H&P (Signed)
NAMEHARVEEN, FLESCH NO.:  [id number]   MEDICAL RECORD NO.:  [id number]          PATIENT TYPE:  INP   LOCATION:  2904                         FACILITY:  [facility]   PHYSICIAN:  Melina Fiddler, MD DATE OF BIRTH:  Dec 13, 1960   DATE OF ADMISSION:  04/18/2006  DATE OF DISCHARGE:                                HISTORY & PHYSICAL   CHIEF COMPLAINT:  Asthma.   HISTORY OF PRESENT ILLNESS:  [age]-year-old African American female  transferred here from [facility] after an acute asthma attack that ended up  requiring magnesium and continued albuterol x1 hour.  She has a history of  intubation secondary to asthma in the distant past.  This is her first big  asthma attack in several years.  She states she has been taking all of her  medications including Advair 250/50, she says one puff daily, although she  is written for two puffs daily.  She also had been taking albuterol and had  required a frequency of about every 1 hour ever since her symptoms began in  the early morning yesterday.   REVIEW OF SYSTEMS:  Positive for fever, negative for cardiovascular systems,  negative for GI symptoms, negative for GU symptoms.  She does have a rash on  her back that she has been treating with Benadryl cream.  PULMONARY:  As  above, significant for wheezing/dyspnea.   PAST MEDICAL HISTORY:  1.  Normal spontaneous vaginal delivery x3.  2.  Asthma, moderate to severe, persistent.  3.  Anemia.  4.  Obesity.  5.  Depression.   FAMILY HISTORY:  Asthma in her father, sister and mother.  Alcohol abuse in  mother.  Father died of congestive heart failure.  Sister died of asthma.  Mother still alive.   SOCIAL HISTORY:  The patient has 3 adult children, 1 adult son and 2 adult  daughters, all living independently from her, but she maintains close  relationships.  She denies alcohol or tobacco use, but does have a history  of marijuana use.  Was released from prison in May of  2006.   ALLERGIES:  PEANUTS give her itching.   MEDICATIONS:  Advair and albuterol that was written for as an outpatient,  Lexapro and iron.   PHYSICAL EXAMINATION:  VITAL SIGNS:  Temperature 98.6 degrees, pulse 98,  respirations 20, blood pressure 114/66, SAT 95% on room air.  GENERAL:  The patient has obvious increased work of breathing, but is  relatively comfortable.  MENTAL STATUS:  Alert and oriented, appropriate mood and affect.  HEENT:  Head:  Nontraumatic.  Eyes:  Extraocular movements intact.  Vision:  Normal vision grossly.  Ears:  Normal hearing.  Mouth/throat:  Normal  oropharynx.  CHEST/BREASTS:  Tenderness inferior to her left breast at the insertion of  some of her accessory muscles.  LUNGS:  There are bilateral inspiratory and expiratory wheezes, increased  work of breathing, no other accessory muscle use and no retractions at this  time.  HEART:  Regular rate and rhythm and tachycardic.  ABDOMEN:  Mild tenderness in the center of the  abdomen on palpation.  EXTREMITIES:  No edema/cyanosis.  Pulses:  Dorsalis pedis 2+.  GENITAL/RECTAL:  Deferred.  NEUROLOGIC:  Cranial nerves II-XII intact.  Strength 5/5 throughout.  DTRs  2+.  Balance/gait normal.  SKIN:  There is a scaling, hyperpigmented, raised, maculopapular rash at the  center of her back with large __________ 15 x 20 cm.   RADIOLOGIC FINDINGS:  Chest x-ray showed hyperinflation.   LABORATORY DATA:  White count 11.7, hemoglobin 10.1, platelets 398,000, MCV  69.4, ANC 9.2, ALC 1.6, AMC 0.4.  Sodium 134, potassium 3.9, chloride 100,  bicarb 25, BUN 4, creatinine 1.0, glucose 105, calcium 8.8.  ABG at 2053  shows 7.414/35.9/110/22.5/98.6%.   ASSESSMENT AND PLAN:  A 50 year old [place] American female with severe  asthma exacerbation.   1.  Asthma:  The patient will get albuterol nebulizers with Atrovent added      every 2 hours plus every 1 hour as needed.  We will admit to stepdown      unit.  Her history  of intubation and death of her sister are very      concerning.  She still has significant increased work of breathing, but      no retractions, and she definitely seems improved, based upon the      emergency department notes.  We will start oral prednisone 60 mg p.o.      daily.  We suspect a viral trigger of her asthma with low-grade fevers.      No evidence of pneumonia on chest x-ray.  Consider repeat if fevers      continue to spike, especially with her mildly elevated white count and      ANC of 9.2.  We will continue Advair, but increase the dose to 500/50      one puff b.i.d.  The patient denies smoking.  If her peak flows do not      improve, we will consider another continuous nebulizer to  break her      and decrease her work of breathing, allowing her to rest.  2.  Anemia:  It seems to be iron deficiency with a decreased MCV and a      history of iron treatment.  It is probably just secondary to menstrual      periods.  We will restart iron, once the patient is free of acute      illness.  3.  Hyponatremia:  This is probably secondary to just some mild hypovolemia      from increase in __________ losses.  We will encourage oral intake at      this time and consider intravenous fluids if the patient is unable to      take orals consistently.  4.  Rash:  We will consider using triamcinolone 0.1% cream.  We will hold      off on this for now for after evaluation by attending physician.      Angeline Slim, M.D.    ______________________________  Melina Fiddler, MD    AL/MEDQ  D:  04/18/2006  T:  04/18/2006  Job:  [id number]

## 2011-03-10 NOTE — Discharge Summary (Signed)
NAMETAKEYA, MARQUIS NO.:  [id number]   MEDICAL RECORD NO.:  [id number]          PATIENT TYPE:  INP   LOCATION:  5009                         FACILITY:  [facility]   PHYSICIAN:  Pearlean Brownie, M.D.DATE OF BIRTH:  05/12/1961   DATE OF ADMISSION:  03/18/2005  DATE OF DISCHARGE:                                 DISCHARGE SUMMARY   DISCHARGE DIAGNOSES:  1. Asthma.  2. Pneumonia.  3. Anemia.     DISCHARGE MEDICATIONS:  1. Albuterol MDI two puffs q.4h. p.r.n. asthma.  2. Advair 250/50 mcg one puff b.i.d.  3. Albuterol 2.5 mg nebulization t.i.d. p.r.n. asthma  4. Prednisone 50 mg one tablet p.o. daily for two days, stop on May 31.  5. Azithromycin 250 mg one tablet p.o. daily for two days, stop on may 31.  6. Singulair 10 mg one tablet p.o. q.h.s.     BRIEF HISTORY AND HOSPITAL COURSE:  A 50 year old African-American female  with history of asthma with a recent hospitalization at [facility].  The patient was discharged home but unable to afford her  Combivent.  After discharge, the patient's condition rapidly declined.  The  patient noted an increase of albuterol use over the past two days, and  increased cough also was noted.  On day of admission, the patient noticed  increase of work of breathing, significant increased shortness of breath,  and also increased cough.  Symptoms did not improve after using albuterol on  day of admission.  The patient came to the [facility] ED for evaluation.  Per the patient, she starts to lose control when she is on Advair, albuterol  p.r.n., and Singulair.  The patient also complained of chest pain that is  worse with deep inspiration along the chest wall bilaterally.  The patient  received albuterol-Atrovent at the ED x4, prednisone 60 mg p.o. x1,  ceftriaxone 1 g IV, and Percocet 5/325 mg x1 for chest pain.   PHYSICAL EXAMINATION ON ADMISSION:  VITAL SIGNS:  Temperature 98.1, pulse  83, blood pressure  142/91, O2 saturation 100% on room air.  GENERAL:  The patient was in no acute distress.  PULMONARY:  Positive for wheezes with occasional rhonchi in all lung fields.  Good air movement.  No nasal flaring, no accessory muscle use.   LABORATORY DATA:  On admission, WBC 12.4, hemoglobin 10.9, platelets 382,  ANC 12.  Electrolytes:  Sodium 136, potassium 3.7, chloride 109, bicarb 21,  BUN 9, creatinine 1.1, calcium 8.8, glucose 132.  ABG, pH 7.417, PCO2 29,  PO2 of 35, bicarb 18.6, 95%.  A chest x-ray showed peribronchial thickening  and an infiltrate to the right base.   BY PROBLEM LIST:  Problem 1.  ASTHMA EXACERBATION:  The patient was placed  on albuterol MDI two puffs with a spacer q.4h. scheduled, q.2h. p.r.n.,  Atrovent MDI two puffs with a spacer q.6h., prednisone 40 mg p.o. daily  initially, then the patient was placed on 80 mg p.o. for one day.  Advair  100/50 mcg one puff b.i.d. and Singulair 10 mg p.o. q.p.m. was  added to this  patient's regimen.  A chest x-ray on May 28 showed improving right middle  lobe pneumonia, stable, mild bronchitic changes, and probable linear  artifact at the left lateral lung base.  The patient improved clinically  remarkably.  The patient tolerated walks in the hall with no oxygen.  On day  of discharge, the patient was afebrile, O2 saturation was 100% on room air.  On physical examination, pulmonary:  Some wheezes bilaterally but good air  movement, no intercostal retractions, no increased work of breathing.  The  patient denied shortness of breath.   Problem 2.  PNEUMONIA:  The patient was placed on Zithromax 500 mg x1, then  250 mg for five days.  A chest x-ray on May 28 showed improving right middle  lobe pneumonia.  The patient was afebrile.  Pleuritic pain improved with  Zithromax and Percocet.  By date of discharge, the patient has no chest  pain.  On physical exam, pulmonary:  Good air movement, no crackles, no  increased work of breathing,  positive for wheezing bilaterally.  The patient  will complete a five-day course of Zithromax.  A chest x-ray needs to be  repeated within six to eight weeks after discharge.  To be followed with  family physician.   Problem 3.  ANEMIA:  Hemoglobin on admission was 10.9, hematocrit 32.4, MCV  81.3.  To be followed as an outpatient by primary physician.   DISCHARGE CONDITION:  Stable.   Follow-up appointment with Dr. Ludwig Clarks at [facility] on March 30, 2005, at 2:45 p.m.   I have written a note for this patient's job, to return on Monday, March 27, 2005.      FIM/MEDQ  D:  03/21/2005  T:  03/21/2005  Job:  [id number]   cc:   Henri Medal, MD  Fax: [phone]

## 2011-03-10 NOTE — Discharge Summary (Signed)
Lydia Griffin, Lydia Griffin NO.:  [id number]   MEDICAL RECORD NO.:  [id number]          PATIENT TYPE:  INP   LOCATION:  5703                         FACILITY:  [facility]   PHYSICIAN:  Levander Campion, M.D.  DATE OF BIRTH:  19-Oct-1961   DATE OF ADMISSION:  03/05/2007  DATE OF DISCHARGE:  03/11/2007                               DISCHARGE SUMMARY   DISCHARGE DIAGNOSES:  1. Asthma exacerbation.  2. Depression.   DISCHARGE MEDICATIONS:  1. Advair 500/50 one puff twice daily.  2. Prednisone 60 mg daily x4 more days then 50 mg x3 days then 40 mg      x3 days then 30 mg x3 days then 20 mg x3 days then 10 mg x3 days      then stop.  3. Singulair 10 mg daily.  4. Albuterol 2 puffs inhaled every 4 hours as needed.  5. Zyrtec 10 mg daily.  6. Lexapro 10 mg daily.   CONSULTS:  None.   PROCEDURES:  None.   FOLLOWUP:  The patient is to follow up with Dr. Ludwig Clarks at the [facility] Mar 12, 2007 at 11 a.m.   SIGNIFICANT LABS ON ADMISSION:  A white blood count 10.0, hemoglobin  11.6, hematocrit of 35.3, platelets of 399, and ANC of 8.3.  A blood gas  is as follows:  pH of 7.478, pCO2 is 33.9, pO2 was 87.3, bicarbonate was  24.8, total CO2 was 25.8, and O2 sat was 97.2.  Admission BMET showed a  sodium of 138, a potassium of 2.8, chloride of 104, bicarb of 28,  glucose of 107, BUN of 6, a creatinine of 0.73, a calcium of 8.6.   DISCHARGE LABS:  As follows:  A sodium of 135, potassium of 4.3,  chloride of 102, bicarb of 28, glucose of 83, BUN of 8, creatinine of  0.79, and a calcium of 9.1.   HOSPITAL COURSE:  [name] is a 50 year old female with a history of  severe asthma who was admitted from clinic in status asthmaticus.   Problem:  Asthma.  The patient was initially admitted in status asthmaticus,  received continuous albuterol treatments and was able to then be weaned  to albuterol treatments q.4h., scheduled q.2h. p.r.n.  Initial PFTs  pretreatment were 50, and post treatment were 125.  These improved to a  pretreatment of 275 at the time of discharge.  The patient was  eventually be able to be spaced to albuterol nebs q.6h.  The patient's  Advair was increased to 500/50 twice daily, and  she was started on prednisone 60 mg daily and be discharged on a long  taper given the fact that she had just finished a course of steroids  recently.  The patient was also continued on her Singulair, and Zyrtec  was started.  The patient remained stable at the time of discharge with  no oxygen requirement and tolerating albuterol nebs q.6h.           ______________________________  Levander Campion, M.D.     JH/MEDQ  D:  03/17/2007  T:  03/17/2007  Job:  [id number]

## 2011-03-10 NOTE — Discharge Summary (Signed)
NAMEDEVERY, ODWYER NO.:  [id number]   MEDICAL RECORD NO.:  [id number]          PATIENT TYPE:  INP   LOCATION:  6731                         FACILITY:  [facility]   PHYSICIAN:  Lupita Raider, M.D.   DATE OF BIRTH:  03/26/1961   DATE OF ADMISSION:  04/18/2006  DATE OF DISCHARGE:                                 DISCHARGE SUMMARY   DISCHARGE DIAGNOSES:  1.  Asthma.  2.  Anemia.  3.  Knee pain.  4.  Depression.  5.  Hypocalcemia.   BRIEF HISTORY AND PHYSICAL:  Please see dictated history and physical for  full details.  Briefly, this is a 50 year old [place] female  transferred from [facility] after an acute asthma attack that ended up  requiring magnesium and continued albuterol x1.  She has a history of  intubation secondary to asthma in the past; however, this is her first big  asthma attack in several years.  She has been taking all of her medications,  Advair 250/50, although compliance is an issue due to finances.   CONSULTATIONS:  Respiratory therapy.   PROCEDURES:  None.   PERTINENT LABORATORIES AT ADMISSION AND DISCHARGE:  Hemoglobin on admission  was 10.1 and 8.9 on the day before discharge.  Sodium was 134 on admission  and 137 on the day of discharge.  Potassium fell from 3.9 on admission to  3.0 on discharge (this was repleted prior to discharge).  Calcium on the day  of discharge was 8.2, creatinine 0.9.  ABG on admission was 7.41, 10, 35.9  and 98%.  This was on 8 FIO2.  Iron was less than 10 and ferritin was 21.   DISCHARGE INSTRUCTIONS:  The patient is to follow up with Dr. Ludwig Clarks in the  [facility] later this week.  She was given the telephone  number.  She is also to follow up with the sports medicine clinic for her  knee.   DISCHARGE MEDICATIONS:  1.  Iron 1 tablet b.i.d.  2.  Albuterol 2 puffs q.4h. as needed.  3.  Advair 500/50, 1 puff inhaled b.i.d.  4.  Lexapro 10 mg p.o. daily.  5.  Prednisone 20 mg  daily x3 more days.  6.  Doxycycline 100 mg p.o. b.i.d. x7 days starting today.  7.  Singulair 10 mg p.o. daily.  8.  Potassium 40 mEq p.o. daily.  9.  Guaifenesin 200 mg q.6h. as needed for cough.  10. Triamcinolone ointment to be applied twice a day to rash.   HOSPITAL COURSE BY PROBLEM LIST:  1.  Asthma.  The patient was admitted for asthma exacerbation to the step-      down unit and started on multiple breathing treatments.  She improved      throughout her stay although was not at baseline, which is 400 peak      flow.  On the day of discharge she was approximately 200-225 peak flow.      Her Advair was increased to 500/50.  She was continued on her albuterol      and Singulair.  She was also started on prednisone 20 b.i.d.  On the day      of discharge the patient developed a productive cough and had a mild      temp and was started on doxycycline for a 7-day course.  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  She      needs to follow up with her primary care physician to discuss this and      other appropriate behavioral changes.  2.  Anemia.  Her hemoglobin dropped during admission, likely due to      dilutional effect.  She has a history of iron-deficiency anemia and will      continue with her iron supplement.  The patient also complained of pica      during admission and she was referred to area clinics by Dr. Cleophas Dunker to      follow up on nutritional behaviors.  3.  Knee pain.  The patient wanted to obtain an MRI for her knee pain, but      it was decided that this was not an acute issue and this will be      followed up as an outpatient.  She is to follow up with Dr. Cleophas Dunker in      the sports medicine clinic at [facility] for this issue.  4.  Depression.  The patient was continued on her Lexapro and will follow up      with her outpatient primary care physician.  5.  Hypocalcemia.  The patient was noted to have low  potassium on the day of      discharge.  This was repleted and she is to follow up with her primary      care physician for this.           ______________________________  Lupita Raider, M.D.     KS/MEDQ  D:  04/22/2006  T:  04/22/2006  Job:  [id number]   cc:   Henri Medal, MD  Fax: [phone]

## 2011-03-20 ENCOUNTER — Inpatient Hospital Stay (HOSPITAL_COMMUNITY)
Admission: EM | Admit: 2011-03-20 | Discharge: 2011-03-23 | DRG: 203 | Disposition: A | Discharge status: Home / Self Care | Payer: Self-pay | Attending: Internal Medicine | Admitting: Internal Medicine

## 2011-03-20 ENCOUNTER — Emergency Department (HOSPITAL_COMMUNITY): Payer: Self-pay

## 2011-03-20 DIAGNOSIS — T380X5A Adverse effect of glucocorticoids and synthetic analogues, initial encounter: Secondary | ICD-10-CM | POA: Diagnosis not present

## 2011-03-20 DIAGNOSIS — D72829 Elevated white blood cell count, unspecified: Secondary | ICD-10-CM | POA: Diagnosis not present

## 2011-03-20 DIAGNOSIS — F329 Major depressive disorder, single episode, unspecified: Secondary | ICD-10-CM | POA: Diagnosis present

## 2011-03-20 DIAGNOSIS — E669 Obesity, unspecified: Secondary | ICD-10-CM | POA: Diagnosis present

## 2011-03-20 DIAGNOSIS — D509 Iron deficiency anemia, unspecified: Secondary | ICD-10-CM | POA: Diagnosis present

## 2011-03-20 DIAGNOSIS — J209 Acute bronchitis, unspecified: Secondary | ICD-10-CM | POA: Diagnosis present

## 2011-03-20 DIAGNOSIS — J45901 Unspecified asthma with (acute) exacerbation: Principal | ICD-10-CM | POA: Diagnosis present

## 2011-03-20 DIAGNOSIS — F3289 Other specified depressive episodes: Secondary | ICD-10-CM | POA: Diagnosis present

## 2011-03-20 DIAGNOSIS — I498 Other specified cardiac arrhythmias: Secondary | ICD-10-CM | POA: Diagnosis not present

## 2011-03-20 LAB — BASIC METABOLIC PANEL
BUN: 13 mg/dL (ref 6–23)
Chloride: 100 mEq/L (ref 96–112)
Chloride: 101 mEq/L (ref 96–112)
GFR calc Af Amer: >60 mL/min (ref >60)
Glucose, Bld: 134 mg/dL — ABNORMAL HIGH (ref 70–99)
Potassium: 3.3 mEq/L — ABNORMAL LOW (ref 3.5–5.1)
Potassium: 3.6 mEq/L (ref 3.5–5.1)
Sodium: 136 mEq/L (ref 135–145)

## 2011-03-20 LAB — CBC
HCT: 41.9 % (ref 36.0–46.0)
HCT: 40.5 % (ref 36.0–46.0)
Hemoglobin: 14.3 g/dL (ref 12.0–15.0)
MCH: 27.6 pg (ref 26.0–34.0)
MCHC: 34.1 g/dL (ref 30.0–36.0)
MCV: 80.9 fL (ref 78.0–100.0)
MCV: 81 fL (ref 78.0–100.0)
Platelets: 299 10*3/uL (ref 150–400)
RBC: 5.18 MIL/uL — ABNORMAL HIGH (ref 3.87–5.11)
RBC: 5 MIL/uL (ref 3.87–5.11)
RDW: 14.3 % (ref 11.5–15.5)
WBC: 7.4 10*3/uL (ref 4.0–10.5)
WBC: 7 10*3/uL (ref 4.0–10.5)

## 2011-03-20 LAB — BASIC METABOLIC PANEL WITH GFR
BUN: 11 mg/dL (ref 6–23)
CO2: 24 meq/L (ref 19–32)
Calcium: 9.1 mg/dL (ref 8.4–10.5)
Creatinine, Ser: 0.76 mg/dL (ref 0.4–1.2)
GFR calc non Af Amer: >60 mL/min (ref >60)
Glucose, Bld: 109 mg/dL — ABNORMAL HIGH (ref 70–99)

## 2011-03-20 LAB — DIFFERENTIAL
Basophils Absolute: 0 10*3/uL (ref 0.0–0.1)
Basophils Relative: 0 % (ref 0–1)
Eosinophils Absolute: 0.1 10*3/uL (ref 0.0–0.7)
Eosinophils Relative: 1 % (ref 0–5)
Lymphocytes Relative: 9 % — ABNORMAL LOW (ref 12–46)
Lymphs Abs: 0.7 10*3/uL (ref 0.7–4.0)
Monocytes Absolute: 0.2 K/uL (ref 0.1–1.0)
Monocytes Relative: 2 % — ABNORMAL LOW (ref 3–12)
Neutro Abs: 6.4 K/uL (ref 1.7–7.7)
Neutrophils Relative %: 87 % — ABNORMAL HIGH (ref 43–77)

## 2011-03-22 LAB — BASIC METABOLIC PANEL
BUN: 8 mg/dL (ref 6–23)
CO2: 24 mEq/L (ref 19–32)
GFR calc non Af Amer: >60 mL/min (ref >60)
Glucose, Bld: 131 mg/dL — ABNORMAL HIGH (ref 70–99)
Potassium: 4.1 mEq/L (ref 3.5–5.1)

## 2011-03-22 LAB — CBC
HCT: 39.8 % (ref 36.0–46.0)
Hemoglobin: 13.3 g/dL (ref 12.0–15.0)
MCV: 81.9 fL (ref 78.0–100.0)
RDW: 14.6 % (ref 11.5–15.5)
WBC: 17.4 10*3/uL — ABNORMAL HIGH (ref 4.0–10.5)

## 2011-03-23 NOTE — H&P (Signed)
NAMETOBIN, CADIENTE NO.:  [id number]  MEDICAL RECORD NO.:  [id number]           PATIENT TYPE:  E  LOCATION:  WLED                         FACILITY:  [facility]  PHYSICIAN:  Mauro Kaufmann, MD         DATE OF BIRTH:  1960/12/03  DATE OF ADMISSION:  03/20/2011 DATE OF DISCHARGE:                             HISTORY & PHYSICAL   CHIEF COMPLAINT:  Shortness of breath.  HISTORY OF PRESENT ILLNESS:  A 50 year old female with a history of asthma who came with worsening of shortness of breath for the past 2 days.  The patient also has been coughing, but denies any coughing up phlegm.  Denies any fever.  The patient does not smoke cigarettes.  In the past, she had to be intubated 10 years ago.  Also, in last December, the patient says that she was admitted to the intensive care unit for the asthma exacerbation.  Denies any chest pain.  Denies any nausea, vomiting, or diarrhea.  Denies any dysuria, urgency, frequency of urination.  PAST MEDICAL HISTORY:  Significant for asthma exacerbation, history of depression, history of intubation due to asthma exacerbation.  CURRENT MEDICATIONS: 1. The patient takes include albuterol inhaler. 2. Claritin 10 mg p.o. daily. 3. Advair 1 puff b.i.d.  SOCIAL HISTORY:  The patient denies tobacco abuse.  No history of alcohol abuse.  No history of illicit drug abuse.  FAMILY HISTORY:  Noncontributory.  REVIEW OF SYSTEMS:  HEENT:  No headache, no blurred vision.  No runny nose.  No sore throat.  NECK:  No history of thyroid problems.  CHEST: Positive history of asthma.  HEART:  No history of CAD.  GI:  No nausea, vomiting or diarrhea.  GENITOURINARY:  No dysuria, urgency, frequency or urination.  NEUROLOGIC:  No history of encephalopathy in the past.  PHYSICAL EXAMINATION:  VITAL SIGNS:  The patient's blood pressure is 133/87; pulse 78, temperature is 97.8, on 94% 2 liters of oxygen. HEENT:  Head is normocephalic.  Eyes, extraocular  movements are intact. Oral mucosa is pink and moist. NECK:  Supple. CHEST:  Bilateral wheezing auscultated. HEART:  S1, S2, regular rate and normal. ABDOMEN:  Soft and nontender.  No organomegaly. EXTREMITIES:  No cyanosis, clubbing or edema. NEUROLOGIC:  No focal deficit noted at this time.  Cranial II through XII grossly intact.  IMAGING STUDIES:  Chest x-ray shows clinical increasing interstitial markings and bronchial wall thickening which may indicate active airway disease or bronchitis.  PERTINENT LABS:  Pending at this time.  ASSESSMENT: 1. Acute bronchitis. 2. Asthma exacerbation.  PLAN:  I am going to start the patient on Solu-Medrol 60 mg IV q.6 h., also start her on Avelox for the asthmatic bronchitis.  The patient will be given DuoNeb nebulizers p.r.n.  We will get pharmacy med reconciliation as the patient has been in the past on antidepressants. At this time, she is only on Claritin, Advair and albuterol.  The patient can be restarted back on Advair at the time of discharge.  We will give the patient Lovenox for DVT prophylaxis.     Sibyl Parr  Sharl Ma, MD     GL/MEDQ  D:  03/20/2011  T:  03/20/2011  Job:  [id number]  Electronically Signed by Mauro Kaufmann  on 03/23/2011 07:04:48 AM

## 2011-03-24 NOTE — Discharge Summary (Signed)
NAMEVALERI, Lydia Griffin NO.:  [id number]  MEDICAL RECORD NO.:  [id number]           PATIENT TYPE:  I  LOCATION:  1423                         FACILITY:  [facility]  PHYSICIAN:  Lydia Blower, MD       DATE OF BIRTH:  1961-03-15  DATE OF ADMISSION:  03/20/2011 DATE OF DISCHARGE:  03/23/2011                              DISCHARGE SUMMARY   PRIMARY CARE PHYSICIAN:  Lydia Griffin, M.D. at [facility].  DISCHARGE DIAGNOSES: 1. Asthma exacerbation. 2. Bronchitis. 3. History of depression. 4. History of iron-deficiency anemia. 5. Sinus tachycardia, resolved. 6. Obesity. 7. History of intubation due to asthma exacerbation in 2006. 8. History of constipation. 9. Leukocytosis due to steroids.  DISCHARGE MEDICATIONS: 1. Keflex 100 mg p.o. twice daily for 5 days. 2. Montelukast 10 mg p.o. daily. 3. Prednisone 60 mg p.o. daily for 2 days then 40 mg p.o. daily for 2     days then 20 mg p.o. daily for 2 days then 10 mg p.o. daily for 2     days then 5 mg p.o. daily for 2 days then discontinue. 4. Advair Diskus 500/50 one puff twice daily. 5. Albuterol inhaler 1-2 puffs every 4 hours as needed for shortness     of breath. 6. Loratadine 10 mg p.o. daily 7. Multivitamin 1 tablet p.o. daily. 8. Trazodone 50 mg daily at bedtime as needed.  BRIEF ADMITTING HISTORY AND PHYSICAL:  [name] is a 50 year old African- American female with history of asthma, who presented with shortness of breath and cough on Mar 20, 2011.  RADIOLOGY AND IMAGING:  Patient had chest x-ray, which showed chronically increased interstitial markings and bronchial wall thickening, may indicate reactive airway disease or bronchitis.  No new acute abnormality noted.  LABORATORY DATA:  CBC shows white count of 17.4, hemoglobin 13.3, hematocrit 39.8, platelet count 278,000.  Electrolytes normal except BUN is 8, creatinine is 0.67.  HOSPITAL COURSE BY PROBLEM: 1. Asthma exacerbation:   Patient reports that she was not taking     Advair Diskus and Singulair due to financial reason at     home.  She reported that she had her orange card expire.     Patient was started on IV steroids as well as moxifloxacin at the     time of admission.  During the course of the hospital stay, her     steroids were transitioned to oral steroids and moxifloxacin was     transitioned to Keflex.  She was also restarted on home inhalers     with good improvement in breathing.  At the time of discharge,     patient will go home on the inhalers that she had been using at the     hospital.  She was also given prescription of the steroids.     Patient was instructed to follow up on the medication assistance     program and get new prescriptions when her assistance comes     through. 2. Bronchitis:  Patient will complete five more day course of Keflex     to complete at  least 8-day course of antibiotics. 3. History of depression:  Stable. 4. Sinus tachycardia:  Likely due to albuterol, improved during the     course of hospital stay. 5. Leukocytosis:  Likely due to steroids.  FOLLOWUP:  The patient to follow with Dr. Earnest Griffin on March 29, 2011 at 1:45 p.m.  Time spent on discharge, talking to the patient, counseling the patient and coordinating care was 35 minutes.     Lydia Blower, MD     SR/MEDQ  D:  03/23/2011  T:  03/23/2011  Job:  [id number]  Electronically Signed by Lydia Griffin  on 03/24/2011 05:16:11 PM

## 2011-03-29 ENCOUNTER — Encounter: Payer: Self-pay | Admitting: Family Medicine

## 2011-03-29 ENCOUNTER — Ambulatory Visit (INDEPENDENT_AMBULATORY_CARE_PROVIDER_SITE_OTHER): Payer: Self-pay | Admitting: Family Medicine

## 2011-03-29 VITALS — BP 122/78 | HR 68 | Ht 66.0 in | Wt 179.0 lb

## 2011-03-29 DIAGNOSIS — J45909 Unspecified asthma, uncomplicated: Secondary | ICD-10-CM

## 2011-03-29 NOTE — Assessment & Plan Note (Signed)
Poorly controlled due to non-compliance due to financial need.  Patient discussing assistance with [employer] and is looking for jobs with insurance at this time.  Is on Max dose Advair at this time.

## 2011-03-29 NOTE — Progress Notes (Signed)
  Subjective:    Patient ID: Lydia Griffin, female    DOB: 10-10-1961, 50 y.o.   MRN: [id number]  HPI Wt Readings from Last 3 Encounters:  03/29/11 179 lb (81.194 kg)  01/26/11 173 lb (78.472 kg)  12/23/10 177 lb (80.287 kg)   Here for Griffin follow-up.  Was admitted to Lydia Griffin by hospitalist service for asthma exacerbation from 5/31- to 5/28.  Required 3L of O2 by Lydia Griffin, was treated for bronchitis with abx.    Patient states she has been taking advair but no Singulair due to cost.  Griffin discharge suggests she has not been compliant with either due to cost.  Patient has been out of work for several Lydia Griffin with Lydia Griffin.  Seems to be triggered by Lydia Griffin. Review of Systems see HPI   Objective:   Physical Exam  Constitutional: She appears well-developed and well-nourished.  Cardiovascular: Normal rate and regular rhythm.   Pulmonary/Chest: Effort normal. No respiratory distress. She has no wheezes. She has rhonchi in the right lower field and the left lower field. She has no rales.          Assessment & Plan:

## 2011-03-29 NOTE — Patient Instructions (Signed)
Follow-up for annual exam Keep log of bowel habits  High-Fiber Diet A high-fiber diet changes your normal diet to include more whole grains, legumes, fruits, and vegetables. Changes in the diet involve replacing refined carbohydrates with unrefined foods. The calorie level of the diet is essentially unchanged. The Dietary Reference Intake (recommended amount) for adult males is 38 grams per day. For adult females, it is 25 grams per day. Pregnant and lactating women should consume 28 grams of fiber per day. Fiber is the intact part of a plant that is not broken down during digestion. Functional fiber is fiber that has been isolated from the plant to provide a beneficial effect in the body. PURPOSE  Increase stool bulk.   Ease and regulate bowel movements.   Lower cholesterol.  INDICATIONS THAT YOU NEED MORE FIBER  Constipation and hemorrhoids.   Uncomplicated diverticulosis (intestine condition) and irritable bowel syndrome.   Weight management.   As a protective measure against hardening of the arteries (atherosclerosis), diabetes, and cancer.  NOTE OF CAUTION If you have a digestive or bowel problem, ask your caregiver for advice before adding high-fiber foods to your diet. Some of the following medical problems are such that a high-fiber diet should not be used without consulting your caregiver. DO NOT USE WITH:  Acute diverticulitis (intestine infection).   Partial small bowel obstructions.   Complicated diverticular disease involving bleeding, rupture (perforation), or abscess (boil, furuncle).   Presence of autonomic neuropathy (nerve damage) or gastric paresis (stomach cannot empty itself).  GUIDELINES FOR INCREASING FIBER IN THE DIET  Start adding fiber to the diet slowly. A gradual increase of about 5 more grams (2 slices of whole-wheat bread, 2 servings of most fruits or vegetables, or 1 bowl of high-fiber cereal) per day is best. Too rapid an increase in fiber may result  in constipation, flatulence, and bloating.   Drink enough water and fluids to keep your urine clear or pale yellow. Water, juice, or caffeine-free drinks are recommended. Not drinking enough fluid may cause constipation.   Eat a variety of high-fiber foods rather than one type of fiber.   Try to increase your intake of fiber through using high-fiber foods rather than fiber pills or supplements that contain small amounts of fiber.   The goal is to change the types of food eaten. Do not supplement your present diet with high-fiber foods, but replace foods in your present diet.  INCLUDE A VARIETY OF FIBER SOURCES  Replace refined and processed grains with whole grains, canned fruits with fresh fruits, and incorporate other fiber sources. White rice, white breads, and most bakery goods contain little or no fiber.   Brown whole-grain rice, buckwheat oats, and many fruits and vegetables are all good sources of fiber. These include: broccoli, Brussels sprouts, cabbage, cauliflower, beets, sweet potatoes, white potatoes (skin on), carrots, tomatoes, eggplant, squash, berries, fresh fruits, and dried fruits.   Cereals appear to be the richest source of fiber. Cereal fiber is found in whole grains and bran. Bran is the fiber-rich outer coat of cereal grain, which is largely removed in refining. In whole-grain cereals, the bran remains. In breakfast cereals, the largest amount of fiber is found in those with "bran" in their names. The fiber content is sometimes indicated on the label.   You may need to include additional fruits and vegetables each day.   In baking, for 1 cup white flour, you may use the following substitutions:   1 cup whole-wheat flour minus 2  tablespoons.   1/2 cup white flour plus 1/2 cup whole-wheat flour.  References: Dietary Reference Intakes: Recommended Intakes for Individuals. [employer]. Institute of Medicine. Food and Nutrition Board. Document Released:  10/09/2005 Document Re-Released: 01/03/2010 [facility] Patient Information 2011 [place], [place].

## 2011-05-06 ENCOUNTER — Telehealth: Payer: Self-pay | Admitting: Family Medicine

## 2011-05-06 MED ORDER — ALBUTEROL 90 MCG/ACT IN AERS: 2.0000 | INHALATION_SPRAY | RESPIRATORY_TRACT | Status: DC | PRN

## 2011-05-06 NOTE — Telephone Encounter (Signed)
advair taking, not taking singulair.    Only taking advair q day  Going to get certified on Monday and starting a new job today.  Out of albuterol since yesterday, having trouble with breathing, last appt was in June.  Advised that we will not usually give out refills on this line, but if pt will call Monday for appt, will send in albuterol so pt can go to work and then get insurance, money for meds.

## 2011-05-12 ENCOUNTER — Encounter: Payer: Self-pay | Admitting: Family Medicine

## 2011-05-12 ENCOUNTER — Ambulatory Visit (INDEPENDENT_AMBULATORY_CARE_PROVIDER_SITE_OTHER): Payer: Self-pay | Admitting: Family Medicine

## 2011-05-12 VITALS — BP 131/86 | HR 76 | Temp 99.2°F | Ht 65.0 in | Wt 174.0 lb

## 2011-05-12 DIAGNOSIS — J45909 Unspecified asthma, uncomplicated: Secondary | ICD-10-CM

## 2011-05-12 MED ORDER — PREDNISONE 20 MG PO TABS: ORAL_TABLET | ORAL | Status: DC

## 2011-05-12 MED ORDER — DOXYCYCLINE HYCLATE 100 MG PO TABS: 100.0000 mg | ORAL_TABLET | Freq: Two times a day (BID) | ORAL | Status: AC

## 2011-05-12 NOTE — Assessment & Plan Note (Signed)
Acute exacerbation, prednisone 60 mg for one week; doxycycline for one week, return if she does not improve

## 2011-05-12 NOTE — Patient Instructions (Signed)
Lots of water Prednisone for one week Antibiotic for one week

## 2011-05-12 NOTE — Progress Notes (Signed)
  Subjective:    Patient ID: Lydia Griffin, female    DOB: 11-29-1960, 50 y.o.   MRN: [id number]  HPI Asthma flair, not sure why, may have picked up a virus.  Knows her condition well, is compliant with all meds, has been using nebulizer every 2-3 days.   Review of Systems  Constitutional: Negative for fever, chills and fatigue.  Respiratory: Positive for cough, shortness of breath and wheezing.   Cardiovascular: Negative for chest pain.       Objective:   Physical Exam  Constitutional: She appears well-developed and well-nourished.  HENT:  Right Ear: External ear normal.  Left Ear: External ear normal.  Mouth/Throat: Oropharynx is clear and moist.  Cardiovascular: Normal rate, regular rhythm and normal heart sounds.   Pulmonary/Chest: Effort normal. She has wheezes. She has no rales.          Assessment & Plan:

## 2011-06-19 ENCOUNTER — Telehealth: Payer: Self-pay | Admitting: Family Medicine

## 2011-06-19 MED ORDER — ALBUTEROL 90 MCG/ACT IN AERS: 2.0000 | INHALATION_SPRAY | RESPIRATORY_TRACT | Status: DC | PRN

## 2011-06-19 NOTE — Telephone Encounter (Signed)
Need refill on inhaler.  Call into [facility] on [address]

## 2011-06-19 NOTE — Telephone Encounter (Signed)
I have refilled 

## 2011-07-18 LAB — CBC
HCT: 35.3 — ABNORMAL LOW
Hemoglobin: 12.1
MCHC: 34
MCHC: 34
MCV: 83.7
Platelets: 315
RBC: 4.23
RDW: 14.5
RDW: 15
WBC: 8

## 2011-07-18 LAB — POCT I-STAT 3, ART BLOOD GAS (G3+)
Acid-base deficit: 4 — ABNORMAL HIGH
Operator id: 244861
Patient temperature: 97.5
pH, Arterial: 7.441 — ABNORMAL HIGH

## 2011-07-18 LAB — BASIC METABOLIC PANEL
BUN: 7
CO2: 22
CO2: 25
Calcium: 9.6
Chloride: 106
Creatinine, Ser: 0.78
Creatinine, Ser: 0.87
GFR calc Af Amer: >60
GFR calc non Af Amer: >60
Glucose, Bld: 107 — ABNORMAL HIGH
Sodium: 138

## 2011-07-18 LAB — DIFFERENTIAL
Basophils Absolute: 0
Basophils Relative: 0
Eosinophils Absolute: 0
Eosinophils Relative: 1
Neutrophils Relative %: 93 — ABNORMAL HIGH

## 2011-07-18 LAB — CARDIAC PANEL(CRET KIN+CKTOT+MB+TROPI)
Relative Index: INVALID
Total CK: 95
Troponin I: 0.01

## 2011-07-24 LAB — BASIC METABOLIC PANEL
BUN: 10
BUN: 9
BUN: 15
CO2: 21
CO2: 22
CO2: 25
Calcium: 9.2
Calcium: 9.6
Chloride: 103
Chloride: 107
Creatinine, Ser: 1.05
Creatinine, Ser: 1.24 — ABNORMAL HIGH
Creatinine, Ser: 0.84
GFR calc non Af Amer: >60
Glucose, Bld: 111 — ABNORMAL HIGH
Glucose, Bld: 183 — ABNORMAL HIGH
Glucose, Bld: 93
Potassium: 3.7

## 2011-07-24 LAB — DIFFERENTIAL
Basophils Relative: 0
Eosinophils Absolute: 0.3
Eosinophils Relative: 4
Lymphs Abs: 1.5
Monocytes Relative: 9
Neutrophils Relative %: 70

## 2011-07-24 LAB — POCT I-STAT 3, VENOUS BLOOD GAS (G3P V)
Bicarbonate: 26 — ABNORMAL HIGH
TCO2: 27
pCO2, Ven: 47.9
pH, Ven: 7.343 — ABNORMAL HIGH
pO2, Ven: 38

## 2011-07-24 LAB — POCT I-STAT, CHEM 8
Calcium, Ion: 1.21
Creatinine, Ser: 1.1
Glucose, Bld: 104 — ABNORMAL HIGH
Potassium: 4.2
TCO2: 27

## 2011-07-24 LAB — CBC
HCT: 38.8
MCHC: 33.6
MCV: 83.8
RBC: 4.63
WBC: 9.2

## 2011-08-01 LAB — INFLUENZA A+B VIRUS AG-DIRECT(RAPID)

## 2011-08-15 ENCOUNTER — Ambulatory Visit (INDEPENDENT_AMBULATORY_CARE_PROVIDER_SITE_OTHER): Payer: Self-pay | Admitting: Family Medicine

## 2011-08-15 ENCOUNTER — Encounter: Payer: Self-pay | Admitting: Family Medicine

## 2011-08-15 VITALS — BP 123/86 | HR 93 | Temp 97.9°F | Ht 65.0 in | Wt 169.1 lb

## 2011-08-15 DIAGNOSIS — J45901 Unspecified asthma with (acute) exacerbation: Secondary | ICD-10-CM | POA: Insufficient documentation

## 2011-08-15 DIAGNOSIS — J45909 Unspecified asthma, uncomplicated: Secondary | ICD-10-CM

## 2011-08-15 MED ORDER — PREDNISONE 20 MG PO TABS: ORAL_TABLET | ORAL | Status: DC

## 2011-08-15 MED ORDER — ALBUTEROL SULFATE (5 MG/ML) 0.5% IN NEBU: 2.5000 mg | INHALATION_SOLUTION | RESPIRATORY_TRACT | Status: DC | PRN

## 2011-08-15 MED ORDER — IPRATROPIUM-ALBUTEROL 0.5-2.5 (3) MG/3ML IN SOLN: 3.0000 mL | Freq: Three times a day (TID) | RESPIRATORY_TRACT | Status: DC

## 2011-08-15 MED ORDER — METHYLPREDNISOLONE SODIUM SUCC 125 MG IJ SOLR
125.0000 mg | Freq: Once | INTRAMUSCULAR | Status: AC
  Administered 2011-08-15: 125 mg via INTRAMUSCULAR

## 2011-08-15 MED ORDER — ALBUTEROL SULFATE (2.5 MG/3ML) 0.083% IN NEBU
2.5000 mg | INHALATION_SOLUTION | Freq: Once | RESPIRATORY_TRACT | Status: AC
  Administered 2011-08-15: 2.5 mg via RESPIRATORY_TRACT

## 2011-08-15 MED ORDER — IPRATROPIUM BROMIDE 0.02 % IN SOLN
0.5000 mg | Freq: Once | RESPIRATORY_TRACT | Status: AC
  Administered 2011-08-15: 0.5 mg via RESPIRATORY_TRACT

## 2011-08-15 NOTE — Patient Instructions (Signed)
I will send in for Atrovent - use this three times a day for the next 2-3 days. Use the Albuterol every 4-6 hours scheduled for the next 2-3 days.   I'll send in for the Prednisone.   If you're not feeling better by Thursday come back and see [place].

## 2011-08-15 NOTE — Progress Notes (Signed)
  Subjective:    Patient ID: Lydia Griffin, female    DOB: [birth year], 50 y.o.   MRN: [id number]  HPI  1.  Asthma exacerbation: 50 year old female with long-standing history of asthma who presents with today acute worsening of symptoms. She attended a birthday party on Sunday with lots of children. Later that day she began to experience an tchy feeling on throat and chest and began wheezing. This continued through Monday into today. She has some prednisone at home and is taking 40 mg last night. She is using her nebulizer at least 5 times a day. She feels somewhat jittery and very short of breath. No fevers or chills. Cough nonproductive.  Review of Systems See HPI above for review of systems.       Objective:   Physical Exam Gen:  Alert, cooperative patient who appears stated age in mild distress, speaking somewhat rapidly.  Able to speak in full sentences.  Coughing. Vital signs reviewed - I repeated pulse ox which showed 92%.  Able to hear wheezing across room. Cardiac:  Regular rate and rhythm without murmur auscultated.  Good S1/S2. Lungs:  Diffuse wheezing in all lung fields, posterior and anterior.  Worse at bases.   Ext:  No clubbing/cyanosis/erythema.  No edema noted bilateral lower extremities.      ** Post nebulizer treatment:   Gen:  Patient appears much more relaxed.  Speaking in full sentences at normal conversation pace.  Pulse ox 95% Lungs: Wheezing persists, but much improved, esp BL bases.         Assessment & Plan:

## 2011-08-15 NOTE — Assessment & Plan Note (Signed)
Provided Albuterol/Atrovent nebulized treatment in clinic.  Also provided Solumedrol 125 mg IM injection. Patient experienced almost immediate relief after initiation of nebulizer treatment. Patient much improved on examination and O2 sats 95% after nebulizer treatment. Provided albuterol, Atrovent, prednisone on an outpatient basis. Patient to return by Thursday if no improvement or sooner by coming to clinic or go into the ED.

## 2011-09-22 ENCOUNTER — Ambulatory Visit (INDEPENDENT_AMBULATORY_CARE_PROVIDER_SITE_OTHER): Payer: Self-pay | Admitting: *Deleted

## 2011-09-22 ENCOUNTER — Encounter: Payer: Self-pay | Admitting: Family Medicine

## 2011-09-22 DIAGNOSIS — Z23 Encounter for immunization: Secondary | ICD-10-CM

## 2011-11-15 ENCOUNTER — Encounter (HOSPITAL_COMMUNITY): Payer: Self-pay | Admitting: Family Medicine

## 2011-11-15 ENCOUNTER — Emergency Department (HOSPITAL_COMMUNITY): Payer: Self-pay

## 2011-11-15 ENCOUNTER — Emergency Department (HOSPITAL_COMMUNITY)
Admission: EM | Admit: 2011-11-15 | Discharge: 2011-11-15 | Disposition: A | Discharge status: Home / Self Care | Payer: Self-pay | Attending: Emergency Medicine | Admitting: Emergency Medicine

## 2011-11-15 DIAGNOSIS — Z79899 Other long term (current) drug therapy: Secondary | ICD-10-CM | POA: Insufficient documentation

## 2011-11-15 DIAGNOSIS — J45901 Unspecified asthma with (acute) exacerbation: Secondary | ICD-10-CM | POA: Insufficient documentation

## 2011-11-15 LAB — BASIC METABOLIC PANEL
BUN: 10 mg/dL (ref 6–23)
CO2: 24 mEq/L (ref 19–32)
Calcium: 9.4 mg/dL (ref 8.4–10.5)
Creatinine, Ser: 0.82 mg/dL (ref 0.50–1.10)
GFR calc non Af Amer: 81 mL/min — ABNORMAL LOW (ref >90)
Glucose, Bld: 80 mg/dL (ref 70–99)

## 2011-11-15 LAB — CBC
HCT: 38.2 % (ref 36.0–46.0)
Hemoglobin: 13 g/dL (ref 12.0–15.0)
MCH: 27.9 pg (ref 26.0–34.0)
MCV: 82 fL (ref 78.0–100.0)
Platelets: 380 10*3/uL (ref 150–400)
RBC: 4.66 MIL/uL (ref 3.87–5.11)

## 2011-11-15 LAB — DIFFERENTIAL
Eosinophils Absolute: 0.5 10*3/uL (ref 0.0–0.7)
Eosinophils Relative: 8 % — ABNORMAL HIGH (ref 0–5)
Lymphs Abs: 3.4 10*3/uL (ref 0.7–4.0)
Monocytes Absolute: 0.5 10*3/uL (ref 0.1–1.0)
Monocytes Relative: 8 % (ref 3–12)

## 2011-11-15 MED ORDER — IPRATROPIUM BROMIDE 0.02 % IN SOLN
0.5000 mg | Freq: Once | RESPIRATORY_TRACT | Status: AC
  Administered 2011-11-15: 0.5 mg via RESPIRATORY_TRACT
  Filled 2011-11-15: qty 2.5

## 2011-11-15 MED ORDER — MAGNESIUM SULFATE 50 % IJ SOLN: 1.0000 g | Freq: Once | INTRAMUSCULAR | Status: DC

## 2011-11-15 MED ORDER — ALBUTEROL (5 MG/ML) CONTINUOUS INHALATION SOLN
10.0000 mg/h | INHALATION_SOLUTION | Freq: Once | RESPIRATORY_TRACT | Status: AC
  Administered 2011-11-15: 10 mg/h via RESPIRATORY_TRACT

## 2011-11-15 MED ORDER — ALBUTEROL SULFATE (5 MG/ML) 0.5% IN NEBU
5.0000 mg | INHALATION_SOLUTION | Freq: Once | RESPIRATORY_TRACT | Status: AC
  Administered 2011-11-15: 5 mg via RESPIRATORY_TRACT

## 2011-11-15 MED ORDER — MAGNESIUM SULFATE 50 % IJ SOLN
INTRAMUSCULAR | Status: AC
  Administered 2011-11-15: 20:00:00 via INTRAVENOUS
  Filled 2011-11-15: qty 100

## 2011-11-15 MED ORDER — ALBUTEROL SULFATE (5 MG/ML) 0.5% IN NEBU
INHALATION_SOLUTION | RESPIRATORY_TRACT | Status: AC
  Administered 2011-11-15: 10 mg/h via RESPIRATORY_TRACT
  Filled 2011-11-15: qty 2

## 2011-11-15 MED ORDER — METHYLPREDNISOLONE SODIUM SUCC 125 MG IJ SOLR
125.0000 mg | Freq: Once | INTRAMUSCULAR | Status: AC
  Administered 2011-11-15: 125 mg via INTRAVENOUS
  Filled 2011-11-15: qty 2

## 2011-11-15 MED ORDER — IPRATROPIUM BROMIDE 0.02 % IN SOLN
0.5000 mg | Freq: Once | RESPIRATORY_TRACT | Status: AC
  Administered 2011-11-15: 0.5 mg via RESPIRATORY_TRACT

## 2011-11-15 MED ORDER — ALBUTEROL SULFATE (5 MG/ML) 0.5% IN NEBU
5.0000 mg | INHALATION_SOLUTION | Freq: Once | RESPIRATORY_TRACT | Status: AC
  Administered 2011-11-15: 5 mg via RESPIRATORY_TRACT
  Filled 2011-11-15: qty 1

## 2011-11-15 MED ORDER — PREDNISONE 10 MG PO TABS: ORAL_TABLET | ORAL | Status: DC

## 2011-11-15 NOTE — ED Provider Notes (Signed)
History     CSN: [id number]  Arrival date & time 11/15/11  1726   First MD Initiated Contact with Patient 11/15/11 1800      Chief Complaint  Patient presents with  . Asthma    (Consider location/radiation/quality/duration/timing/severity/associated sxs/prior treatment) Patient is a 51 y.o. female presenting with wheezing. The history is provided by the patient.  Wheezing  The current episode started 3 to 5 days ago. The problem occurs frequently. The problem has been gradually worsening. The problem is severe. The symptoms are relieved by nothing. Associated symptoms include shortness of breath and wheezing. Pertinent negatives include no chest pain, no fever, no rhinorrhea, no sore throat and no cough. There was no intake of a foreign body. She has had intermittent steroid use. Her past medical history is significant for asthma and past wheezing.    Past Medical History  Diagnosis Date  . Asthma     Past Surgical History  Procedure Date  . Tubal ligation   . Hand     flexor tendon repair    Family History  Problem Relation Age of Onset  . Asthma Mother   . Heart disease Father 58    MI  . Alcohol abuse Father   . Asthma Sister 9    death from asthma    History  Substance Use Topics  . Smoking status: Never Smoker   . Smokeless tobacco: Never Used  . Alcohol Use: No    OB History    Grav Para Term Preterm Abortions TAB SAB Ect Mult Living                  Review of Systems  Constitutional: Negative for fever and chills.  HENT: Negative.  Negative for sore throat and rhinorrhea.   Respiratory: Positive for shortness of breath and wheezing. Negative for cough.   Cardiovascular: Negative for chest pain.  Gastrointestinal: Negative.   Musculoskeletal: Negative.   Skin: Negative.   Neurological: Negative.     Allergies  Review of patient's allergies indicates no known allergies.  Home Medications   Current Outpatient Rx  Name Route Sig Dispense  Refill  . ALBUTEROL SULFATE (5 MG/ML) 0.5% IN NEBU Nebulization Take 2.5 mg by nebulization every 4 (four) hours as needed. For shortness of breath    . ALBUTEROL 90 MCG/ACT IN AERS Inhalation Inhale 2 puffs into the lungs every 4 (four) hours as needed. For shortness of breath    . FLUTICASONE-SALMETEROL 500-50 MCG/DOSE IN AEPB Inhalation Inhale 1 puff into the lungs 2 (two) times daily.      [name] Kitchen LORATADINE 10 MG PO TABS Oral Take 10 mg by mouth daily.      . ADULT MULTIVITAMIN W/MINERALS CH Oral Take 1 tablet by mouth daily.    . TRAZODONE HCL 50 MG PO TABS Oral Take 25 mg by mouth at bedtime as needed. for sleep    . MONTELUKAST SODIUM 10 MG PO TABS Oral Take 10 mg by mouth daily.        BP 123/74  Pulse 93  Resp 20  SpO2 97%  Physical Exam  Constitutional: She appears well-developed and well-nourished.  HENT:  Head: Normocephalic.  Neck: Normal range of motion. Neck supple.  Cardiovascular: Normal rate and regular rhythm.   Pulmonary/Chest: She has wheezes. She exhibits no tenderness.       Labored breathing with inspiratory and expiratory wheezing. (**Examined after 5mg  Albuterol nebulizer).  Abdominal: Soft. Bowel sounds are normal. There is no tenderness.  There is no rebound and no guarding.  Musculoskeletal: Normal range of motion.  Neurological: She is alert. No cranial nerve deficit.  Skin: Skin is warm and dry. No rash noted.  Psychiatric: She has a normal mood and affect.    ED Course  Procedures (including critical care time)  Labs Reviewed  DIFFERENTIAL - Abnormal; Notable for the following:    Neutrophils Relative 27 (*)    Neutro Abs 1.6 (*)    Lymphocytes Relative 56 (*)    Eosinophils Relative 8 (*)    All other components within normal limits  CBC  I-STAT, CHEM 8   Dg Chest Portable 1 View  11/15/2011  *RADIOLOGY REPORT*  Clinical Data: Shortness of breath.  PORTABLE CHEST - 1 VIEW  Comparison: 03/20/2011  Findings: Mild airway thickening and interstitial  accentuation is chronic, possibly from reactive airways disease.  Cardiac and mediastinal contours appear unremarkable.  No pleural effusion is observed.  IMPRESSION:  1.  Mild airway thickening and interstitial accentuation, chronic and possibly from reactive airways disease.  Original Report Authenticated By: Dellia Cloud, M.D.     No diagnosis found.    MDM          Rodena Medin, PA-C 11/15/11 1949

## 2011-11-15 NOTE — ED Notes (Signed)
Pt ambulates well on room air. O2 sat 95% while ambulating. PA aware.

## 2011-11-15 NOTE — ED Notes (Signed)
Pt noted to have O2 sat of 90% at triage window, reporting asthma attack. No triage room available. Pt brought to weight chair at nurses station and vitals obtained. Portable O2 tank retrieved and pt given 5mg  albuterol neb per protocol.

## 2011-11-15 NOTE — ED Notes (Signed)
Reports asthma getting progressively worse for the past 2 days. Respirations labored, wheezing heard throughout lung fields. Pt able to speak in short phrases. 02 sats 97% on 2L.

## 2011-11-15 NOTE — ED Notes (Signed)
MD at bedside. 

## 2011-11-15 NOTE — ED Notes (Signed)
RT called for continuous neb 

## 2011-11-15 NOTE — ED Provider Notes (Signed)
Lydia Griffin S 8:00 PM patient discussed in sign out with Lydia Adie PA-C, patient with history of asthma presenting with typical asthma attack. Patient currently receiving hour-long nebulizer treatment. Patient has already received Solu-Medrol, albuterol, and magnesium. Plan to reassess patient after hour-long neb for disposition.   9:00 PM patient continues to feel some shortness of breath and has wheezing on exam. Will give additional breathing treatment.  10:00 PM patient feeling much better after breathing treatments. She has been given dose of steroids. This time patient requests return home. Patient states she has plenty of her nebulizer medications. Plan to provide patient with a prescription for prednisone.  Angus Seller, [place] 11/16/11 [phone]

## 2011-11-15 NOTE — ED Provider Notes (Signed)
Medical screening examination/treatment/procedure(s) were performed by non-physician practitioner and as supervising physician I was immediately available for consultation/collaboration.   Gwyneth Sprout, MD 11/15/11 2256

## 2011-11-16 NOTE — ED Provider Notes (Signed)
Medical screening examination/treatment/procedure(s) were performed by non-physician practitioner and as supervising physician I was immediately available for consultation/collaboration.   Yohan Samons, MD 11/16/11 1637 

## 2011-12-04 IMAGING — CR DG CHEST 1V PORT
1 series · 1 of 1 positions shown · non-contrast
Comparison: 11/02/2009.

CLINICAL DATA: History of asthma.  Cough and shortness of breath.

PORTABLE CHEST - 1 VIEW

[view not recorded]
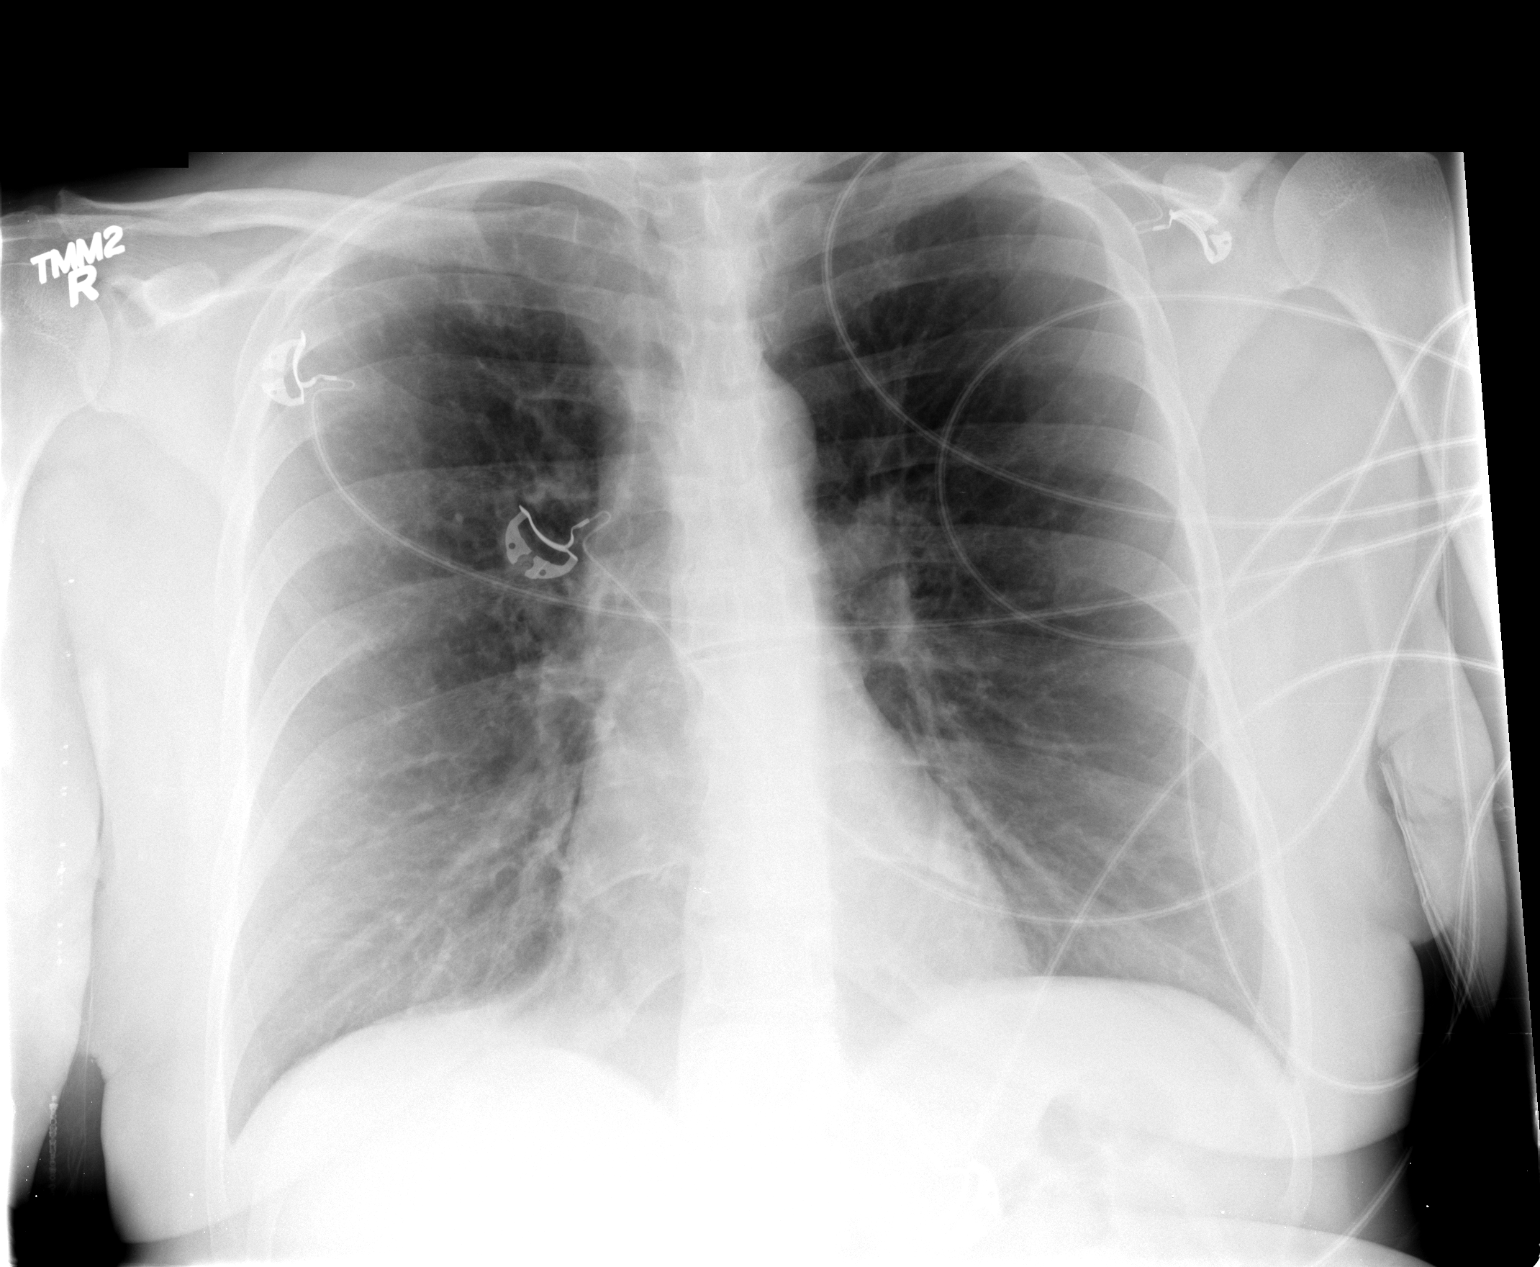

[1 of 1 positions shown; findings below may reference images not displayed]

FINDINGS: 6986 hours.  The heart size and mediastinal contours are
stable.  The lungs appear stable with mild central airway
thickening.  There is no confluent airspace opacity, edema, pleural
effusion or pneumothorax.  Telemetry leads overlie the chest.
IMPRESSION: Stable mild chronic lung disease.  No acute cardiopulmonary
process.

## 2012-02-08 ENCOUNTER — Telehealth: Payer: Self-pay | Admitting: *Deleted

## 2012-02-08 NOTE — Telephone Encounter (Signed)
Spoke with Dr. Earnest Bailey and she informed me that she cannot see this pt tomorrow 02/09/2012 bc she is not scheduled in clinic tomorrow afternoon. I attempted to call pt at all of the numbers that were listed and the home and cell numbers were disconnected. I called the work number and got a general VM and did not feel comfortable leaving the message. I told Dr. Earnest Bailey that I would schedule the pt with another provider or get her into the overflow to be seen she agreed.Loralee Pacas Starr

## 2012-02-09 ENCOUNTER — Other Ambulatory Visit (HOSPITAL_COMMUNITY)
Admission: RE | Admit: 2012-02-09 | Discharge: 2012-02-09 | Disposition: A | Discharge status: Home / Self Care | Payer: Self-pay | Source: Ambulatory Visit | Attending: Family Medicine | Admitting: Family Medicine

## 2012-02-09 ENCOUNTER — Ambulatory Visit (INDEPENDENT_AMBULATORY_CARE_PROVIDER_SITE_OTHER): Payer: Self-pay | Admitting: Family Medicine

## 2012-02-09 ENCOUNTER — Other Ambulatory Visit (HOSPITAL_COMMUNITY)
Admission: RE | Admit: 2012-02-09 | Discharge: 2012-02-09 | Disposition: A | Discharge status: Home / Self Care | Payer: No Typology Code available for payment source | Source: Ambulatory Visit | Attending: Family Medicine | Admitting: Family Medicine

## 2012-02-09 VITALS — BP 138/78 | HR 72 | Temp 98.4°F | Ht 66.0 in | Wt 160.0 lb

## 2012-02-09 DIAGNOSIS — N760 Acute vaginitis: Secondary | ICD-10-CM

## 2012-02-09 DIAGNOSIS — Z113 Encounter for screening for infections with a predominantly sexual mode of transmission: Secondary | ICD-10-CM | POA: Insufficient documentation

## 2012-02-09 DIAGNOSIS — Z01419 Encounter for gynecological examination (general) (routine) without abnormal findings: Secondary | ICD-10-CM | POA: Insufficient documentation

## 2012-02-09 DIAGNOSIS — J45909 Unspecified asthma, uncomplicated: Secondary | ICD-10-CM

## 2012-02-09 DIAGNOSIS — Z124 Encounter for screening for malignant neoplasm of cervix: Secondary | ICD-10-CM

## 2012-02-09 DIAGNOSIS — J45901 Unspecified asthma with (acute) exacerbation: Secondary | ICD-10-CM

## 2012-02-09 DIAGNOSIS — N898 Other specified noninflammatory disorders of vagina: Secondary | ICD-10-CM

## 2012-02-09 LAB — POCT WET PREP (WET MOUNT)

## 2012-02-09 MED ORDER — PREDNISONE 10 MG PO TABS: ORAL_TABLET | ORAL | Status: DC

## 2012-02-09 MED ORDER — IPRATROPIUM BROMIDE 0.02 % IN SOLN
0.5000 mg | Freq: Once | RESPIRATORY_TRACT | Status: AC
  Administered 2012-02-09: 0.5 mg via RESPIRATORY_TRACT

## 2012-02-09 MED ORDER — METHYLPREDNISOLONE SODIUM SUCC 125 MG IJ SOLR
125.0000 mg | Freq: Once | INTRAMUSCULAR | Status: AC
  Administered 2012-02-09: 125 mg via INTRAMUSCULAR

## 2012-02-09 MED ORDER — METRONIDAZOLE 500 MG PO TABS: 500.0000 mg | ORAL_TABLET | Freq: Two times a day (BID) | ORAL | Status: AC

## 2012-02-09 MED ORDER — ALBUTEROL SULFATE (2.5 MG/3ML) 0.083% IN NEBU
2.5000 mg | INHALATION_SOLUTION | Freq: Once | RESPIRATORY_TRACT | Status: AC
  Administered 2012-02-09: 2.5 mg via RESPIRATORY_TRACT

## 2012-02-09 NOTE — Progress Notes (Signed)
Patient ID: Lydia Griffin, female   DOB: 20-Feb-1961, 51 y.o.   MRN: [id number] Lydia Griffin is a 51 y.o. female who presents to [facility] today for vaginal discharge and eczema.  #1. Vaginal discharge: Present for past 3-4 days. Describes of white thick discharge. Denies any burning or itching. No new sexual contacts. She states she does use condoms with sexual intercourse. No abdominal pain, nausea, vomiting, fevers. No dysuria.  #2. Wheezing: Worsened x past 3-4 days as well.  Patient has no history of asthma he experiences one to 2 exacerbations a year. She is currently on Advair as well as albuterol rescue inhaler. She also has a nebulizer machine. She's been using her albuterol more frequently every 4 hours without relief of her symptoms for same time period. She also takes Singulair.  No recent illnesses.  No fevers or chills. Cough present at night, dry hacking cough.     The following portions of the patient's history were reviewed and updated as appropriate: allergies, current medications, past medical history, family and social history, and problem list.  Patient is a nonsmoker.  ROS as above otherwise neg. No Chest pain, palpitations, SOB, Fever, Chills, Abd pain, N/V/D.  Medications reviewed. Current Outpatient Prescriptions  Medication Sig Dispense Refill  . albuterol (PROVENTIL) (5 MG/ML) 0.5% nebulizer solution Take 2.5 mg by nebulization every 4 (four) hours as needed. For shortness of breath      . albuterol (PROVENTIL,VENTOLIN) 90 MCG/ACT inhaler Inhale 2 puffs into the lungs every 4 (four) hours as needed. For shortness of breath      . Fluticasone-Salmeterol (ADVAIR DISKUS) 500-50 MCG/DOSE AEPB Inhale 1 puff into the lungs 2 (two) times daily.        [name] Kitchen loratadine (CLARITIN) 10 MG tablet Take 10 mg by mouth daily.        . Multiple Vitamin (MULITIVITAMIN WITH MINERALS) TABS Take 1 tablet by mouth daily.      . montelukast (SINGULAIR) 10 MG tablet Take 10 mg by mouth daily.        .  predniSONE (DELTASONE) 10 MG tablet Take 6 tabs on day one, take 5 tabs on day 2, take 4 tablets on day 3, take 3 tabs on day 4, take 2 tabs on day 5, take 1 tablet a day 6.  21 tablet  0  . traZODone (DESYREL) 50 MG tablet Take 25 mg by mouth at bedtime as needed. for sleep        Exam:  BP 138/78  Pulse 72  Temp(Src) 98.4 F (36.9 C) (Oral)  Ht 5\' 6"  (1.676 m)  Wt 160 lb (72.576 kg)  BMI 25.82 kg/m2 Gen: Well NAD HEENT: EOMI,  MMM Lungs: Diffuse wheezing in all lung fields. No increased respiratory rate or accessory respiratory muscle use. Heart: RRR no MRG Abd: NABS, NT, ND Exts: Non edematous BL  LE, warm and well perfused.  GYN:  External genitalia within normal limits.  Vaginal mucosa pink, moist, normal rugae.  Nonfriable cervix without lesions, moderate, thick white discharge but no bleeding noted on speculum exam.   Also obtained Pap smear today as she is overdue.     No results found for this or any previous visit (from the past 72 hour(s)).

## 2012-02-09 NOTE — Assessment & Plan Note (Addendum)
Wet prep obtained in clinic. Revealed BV.  Explained what this meant to patient and it is not STD. Treat with Flagyl.

## 2012-02-09 NOTE — Progress Notes (Signed)
Addended by: Deno Etienne on: 02/09/2012 03:25 PM   Modules accepted: Orders

## 2012-02-09 NOTE — Assessment & Plan Note (Addendum)
Nebulizer treatment of albuterol Atrovent provided in clinic. Pulmonary exam status post nebulizer treatment revealed persistent mild wheezing throughout but much improved. As still wheezing, provided Solumedrol IM in clinic. Will also prescribe short term burst of steroids at home.

## 2012-02-14 ENCOUNTER — Encounter: Payer: Self-pay | Admitting: Family Medicine

## 2012-02-24 ENCOUNTER — Inpatient Hospital Stay (HOSPITAL_COMMUNITY)
Admission: EM | Admit: 2012-02-24 | Discharge: 2012-02-28 | DRG: 203 | Disposition: A | Discharge status: Home / Self Care | Payer: Self-pay | Attending: Family Medicine | Admitting: Family Medicine

## 2012-02-24 ENCOUNTER — Encounter (HOSPITAL_COMMUNITY): Payer: Self-pay | Admitting: Emergency Medicine

## 2012-02-24 ENCOUNTER — Emergency Department (HOSPITAL_COMMUNITY): Payer: Self-pay

## 2012-02-24 DIAGNOSIS — F329 Major depressive disorder, single episode, unspecified: Secondary | ICD-10-CM

## 2012-02-24 DIAGNOSIS — J4550 Severe persistent asthma, uncomplicated: Secondary | ICD-10-CM | POA: Diagnosis present

## 2012-02-24 DIAGNOSIS — E663 Overweight: Secondary | ICD-10-CM | POA: Diagnosis present

## 2012-02-24 DIAGNOSIS — J45909 Unspecified asthma, uncomplicated: Secondary | ICD-10-CM | POA: Diagnosis present

## 2012-02-24 DIAGNOSIS — J45901 Unspecified asthma with (acute) exacerbation: Secondary | ICD-10-CM

## 2012-02-24 DIAGNOSIS — Z79899 Other long term (current) drug therapy: Secondary | ICD-10-CM

## 2012-02-24 LAB — DIFFERENTIAL
Basophils Absolute: 0 10*3/uL (ref 0.0–0.1)
Lymphocytes Relative: 50 % — ABNORMAL HIGH (ref 12–46)
Neutro Abs: 2 10*3/uL (ref 1.7–7.7)
Neutrophils Relative %: 35 % — ABNORMAL LOW (ref 43–77)

## 2012-02-24 LAB — CBC
HCT: 39.7 % (ref 36.0–46.0)
Platelets: 314 10*3/uL (ref 150–400)
RDW: 14.2 % (ref 11.5–15.5)
WBC: 5.6 10*3/uL (ref 4.0–10.5)

## 2012-02-24 LAB — BASIC METABOLIC PANEL
CO2: 23 mEq/L (ref 19–32)
Chloride: 103 mEq/L (ref 96–112)
GFR calc Af Amer: >90 mL/min (ref >90)
Potassium: 3.9 mEq/L (ref 3.5–5.1)
Sodium: 136 mEq/L (ref 135–145)

## 2012-02-24 LAB — CARDIAC PANEL(CRET KIN+CKTOT+MB+TROPI)
CK, MB: 5.6 ng/mL — ABNORMAL HIGH (ref 0.3–4.0)
Relative Index: 3.3 — ABNORMAL HIGH (ref 0.0–2.5)
Total CK: 170 U/L (ref 7–177)
Troponin I: <0.3 ng/mL (ref <0.30)

## 2012-02-24 LAB — MRSA PCR SCREENING: MRSA by PCR: NEGATIVE

## 2012-02-24 MED ORDER — ACETAMINOPHEN 325 MG PO TABS: 650.0000 mg | ORAL_TABLET | Freq: Four times a day (QID) | ORAL | Status: DC | PRN

## 2012-02-24 MED ORDER — SODIUM CHLORIDE 0.9 % IV BOLUS (SEPSIS)
1000.0000 mL | Freq: Once | INTRAVENOUS | Status: AC
  Administered 2012-02-24: 1000 mL via INTRAVENOUS

## 2012-02-24 MED ORDER — ALBUTEROL SULFATE (5 MG/ML) 0.5% IN NEBU
INHALATION_SOLUTION | RESPIRATORY_TRACT | Status: AC
  Filled 2012-02-24: qty 1

## 2012-02-24 MED ORDER — IPRATROPIUM BROMIDE 0.02 % IN SOLN
0.5000 mg | RESPIRATORY_TRACT | Status: DC
  Administered 2012-02-24 – 2012-02-25 (×5): 0.5 mg via RESPIRATORY_TRACT
  Filled 2012-02-24 (×6): qty 2.5

## 2012-02-24 MED ORDER — ALBUTEROL SULFATE (5 MG/ML) 0.5% IN NEBU: 2.5000 mg | INHALATION_SOLUTION | RESPIRATORY_TRACT | Status: DC | PRN

## 2012-02-24 MED ORDER — ALBUTEROL SULFATE (5 MG/ML) 0.5% IN NEBU
2.5000 mg | INHALATION_SOLUTION | RESPIRATORY_TRACT | Status: DC
  Filled 2012-02-24: qty 2

## 2012-02-24 MED ORDER — ALBUTEROL (5 MG/ML) CONTINUOUS INHALATION SOLN: 10.0000 mg/h | INHALATION_SOLUTION | Freq: Once | RESPIRATORY_TRACT | Status: DC

## 2012-02-24 MED ORDER — PANTOPRAZOLE SODIUM 40 MG PO TBEC
40.0000 mg | DELAYED_RELEASE_TABLET | Freq: Every day | ORAL | Status: DC
  Administered 2012-02-25 – 2012-02-28 (×3): 40 mg via ORAL
  Filled 2012-02-24 (×3): qty 1

## 2012-02-24 MED ORDER — ONDANSETRON HCL 4 MG/2ML IJ SOLN: 4.0000 mg | Freq: Four times a day (QID) | INTRAMUSCULAR | Status: DC | PRN

## 2012-02-24 MED ORDER — ALBUTEROL (5 MG/ML) CONTINUOUS INHALATION SOLN
10.0000 mg/h | INHALATION_SOLUTION | Freq: Once | RESPIRATORY_TRACT | Status: AC
  Administered 2012-02-24: 10 mg/h via RESPIRATORY_TRACT

## 2012-02-24 MED ORDER — HYDROCODONE-ACETAMINOPHEN 5-325 MG PO TABS
1.0000 | ORAL_TABLET | ORAL | Status: DC | PRN
  Administered 2012-02-24: 2 via ORAL
  Filled 2012-02-24: qty 2

## 2012-02-24 MED ORDER — ENOXAPARIN SODIUM 40 MG/0.4ML ~~LOC~~ SOLN
40.0000 mg | Freq: Every day | SUBCUTANEOUS | Status: DC
  Administered 2012-02-24 – 2012-02-27 (×4): 40 mg via SUBCUTANEOUS
  Filled 2012-02-24 (×7): qty 0.4

## 2012-02-24 MED ORDER — ALBUTEROL SULFATE (5 MG/ML) 0.5% IN NEBU
2.5000 mg | INHALATION_SOLUTION | RESPIRATORY_TRACT | Status: DC
  Administered 2012-02-24 – 2012-02-25 (×4): 2.5 mg via RESPIRATORY_TRACT
  Filled 2012-02-24 (×5): qty 0.5

## 2012-02-24 MED ORDER — ACETAMINOPHEN 650 MG RE SUPP: 650.0000 mg | Freq: Four times a day (QID) | RECTAL | Status: DC | PRN

## 2012-02-24 MED ORDER — PREDNISONE 50 MG PO TABS
50.0000 mg | ORAL_TABLET | Freq: Every day | ORAL | Status: DC
  Administered 2012-02-25 – 2012-02-28 (×4): 50 mg via ORAL
  Filled 2012-02-24 (×6): qty 1

## 2012-02-24 MED ORDER — POTASSIUM CHLORIDE IN NACL 20-0.45 MEQ/L-% IV SOLN
INTRAVENOUS | Status: DC
  Administered 2012-02-24: 19:00:00 via INTRAVENOUS
  Filled 2012-02-24 (×5): qty 1000

## 2012-02-24 MED ORDER — ZOLPIDEM TARTRATE 5 MG PO TABS
5.0000 mg | ORAL_TABLET | Freq: Every evening | ORAL | Status: DC | PRN
  Administered 2012-02-24 – 2012-02-26 (×3): 5 mg via ORAL
  Filled 2012-02-24 (×3): qty 1

## 2012-02-24 MED ORDER — METHYLPREDNISOLONE SODIUM SUCC 125 MG IJ SOLR
125.0000 mg | Freq: Once | INTRAMUSCULAR | Status: AC
  Administered 2012-02-24: 125 mg via INTRAVENOUS
  Filled 2012-02-24: qty 2

## 2012-02-24 MED ORDER — ALBUTEROL SULFATE (5 MG/ML) 0.5% IN NEBU
2.5000 mg | INHALATION_SOLUTION | RESPIRATORY_TRACT | Status: AC
  Administered 2012-02-24: 2.5 mg via RESPIRATORY_TRACT
  Filled 2012-02-24: qty 0.5

## 2012-02-24 MED ORDER — ONDANSETRON HCL 4 MG PO TABS: 4.0000 mg | ORAL_TABLET | Freq: Four times a day (QID) | ORAL | Status: DC | PRN

## 2012-02-24 MED ORDER — MAGNESIUM SULFATE 40 MG/ML IJ SOLN
2.0000 g | Freq: Once | INTRAMUSCULAR | Status: AC
  Administered 2012-02-24: 2 g via INTRAVENOUS
  Filled 2012-02-24: qty 50

## 2012-02-24 NOTE — ED Provider Notes (Addendum)
History     CSN: [id number]  Arrival date & time 02/24/12  1135   First MD Initiated Contact with Patient 02/24/12 1136      Chief Complaint  Patient presents with  . Asthma    (Consider location/radiation/quality/duration/timing/severity/associated sxs/prior treatment) HPI  50yoF history of asthma, history of intubation for same presents with shortness of breath. The patient states that she began having more shortness of breath and wheezing 2-3 days ago. She states it has become progressively worse despite her albuterol inhaler and nebulizer at home. She's using more frequently than usual. She states she checked her peak flow at home and it was 40 which is abnormally low for her. She denies recent illness, fevers, chills. She denies chest pain. She states that she does have mild upper back pain which she usually has with her severe asthma exacerbations. Her triggers include allergies, change in weather. Feels like her typical asthma exacerbation to her. Last prednisone use 1-2 weeks ago per patient.  Has been compliant with her advair, claritin, albuterol  Past Medical History  Diagnosis Date  . Asthma     Past Surgical History  Procedure Date  . Tubal ligation   . Hand     flexor tendon repair    Family History  Problem Relation Age of Onset  . Asthma Mother   . Heart disease Father 5    MI  . Alcohol abuse Father   . Asthma Sister 9    death from asthma    History  Substance Use Topics  . Smoking status: Never Smoker   . Smokeless tobacco: Never Used  . Alcohol Use: No    OB History    Grav Para Term Preterm Abortions TAB SAB Ect Mult Living                  Review of Systems  All other systems reviewed and are negative.   except as noted HPI   Allergies  Review of patient's allergies indicates no known allergies.  Home Medications   Current Outpatient Rx  Name Route Sig Dispense Refill  . ALBUTEROL SULFATE (5 MG/ML) 0.5% IN NEBU Nebulization  Take 2.5 mg by nebulization every 4 (four) hours as needed. For shortness of breath    . ALBUTEROL 90 MCG/ACT IN AERS Inhalation Inhale 2 puffs into the lungs every 4 (four) hours as needed. For shortness of breath    . FLUTICASONE-SALMETEROL 500-50 MCG/DOSE IN AEPB Inhalation Inhale 1 puff into the lungs 2 (two) times daily.      [name] Kitchen LORATADINE 10 MG PO TABS Oral Take 10 mg by mouth daily.     . ADULT MULTIVITAMIN W/MINERALS CH Oral Take 1 tablet by mouth daily.      BP 126/70  Pulse 80  Temp(Src) 98.2 F (36.8 C) (Oral)  Resp 21  SpO2 94%  Physical Exam  Nursing note and vitals reviewed. Constitutional: She is oriented to person, place, and time. She appears well-developed.  HENT:  Head: Atraumatic.  Mouth/Throat: Oropharynx is clear and moist.  Eyes: Conjunctivae and EOM are normal. Pupils are equal, round, and reactive to light.  Neck: Normal range of motion. Neck supple.       No stridor  Cardiovascular: Normal rate, regular rhythm, normal heart sounds and intact distal pulses.   Pulmonary/Chest: Effort normal. No respiratory distress. She has wheezes. She has no rales.       Diffuse ins/exp wheeze  Abdominal: Soft. She exhibits no distension. There is  no tenderness. There is no rebound and no guarding.  Musculoskeletal: Normal range of motion. She exhibits no edema and no tenderness.  Neurological: She is alert and oriented to person, place, and time.  Skin: Skin is warm and dry. No rash noted.  Psychiatric: She has a normal mood and affect.    Date: 02/24/2012  Rate: 62  Rhythm: normal sinus rhythm  QRS Axis: normal  Intervals: qtc 422  ST/T Wave abnormalities: nonspecific ST changes  Conduction Disutrbances:none  Narrative Interpretation:   Old EKG Reviewed: no significant change from previous   ED Course  Procedures (including critical care time)  Labs Reviewed  DIFFERENTIAL - Abnormal; Notable for the following:    Neutrophils Relative 35 (*)    Lymphocytes  Relative 50 (*)    Eosinophils Relative 6 (*)    All other components within normal limits  BASIC METABOLIC PANEL - Abnormal; Notable for the following:    GFR calc non Af Amer 78 (*)    All other components within normal limits  CBC   Dg Chest Portable 1 View  02/24/2012  *RADIOLOGY REPORT*  Clinical Data: Asthma, shortness of breath  PORTABLE CHEST - 1 VIEW  Comparison: November 15, 2011  Findings: The cardiac silhouette, mediastinum, pulmonary vasculature are within normal limits.  Both lungs are clear except for mild, diffuse interstitial prominence, unchanged.   There is no acute bony abnormality.  IMPRESSION: Stable chest x-ray with no evidence of acute cardiac or pulmonary process.  Original Report Authenticated By: Brandon Melnick, M.D.    1. Asthma exacerbation     MDM  Shortness of breath typical of her asthma. She does have moderate persistent asthma and has been intubated for asthma in the past. Prior to arrival she states that her peak flow was 40. In the emergency department it is 60. She's not feeling significantly better after an hour-long albuterol nebulizer, magnesium, Solu-Medrol. On room air she is approximately 89% per nursing staff. An additional hour-long nebulizer was ordered. Plan to admit the patient to family practice for further management of her asthma. Will discuss admission with family practice.   Discussed with FP resident Dr. Roslynn Amble who accepted patient in transfer for Dr. Denny Levy. Plan to transport to [facility], telemetry.     Forbes Cellar, MD 02/24/12 1422  Forbes Cellar, MD 02/24/12 1435

## 2012-02-24 NOTE — Progress Notes (Signed)
Patient became tachycardic and tachypneic post second cont neb.  HR upon arrival was in the mid 60's, but around 140 post second 1hr neb.  She is being transferred to [facility] and currently has inspiratory and expiratory wheezing.

## 2012-02-24 NOTE — ED Notes (Signed)
PTS PEAK FLOW 60 RT AT BEDSIDE NEB STARTED CONTINOUS

## 2012-02-24 NOTE — H&P (Signed)
Family Medicine Teaching [facility] Admission History and Physical  Patient name: Lydia Griffin Medical record number: [id number] Date of birth: 1961/08/19 Age: 51 y.o. Gender: female  Primary Care Provider: Delbert Harness, MD, MD  Chief Complaint: Asthma Exacerbation History of Present Illness: Lydia Griffin is a 51 y.o. year old female presenting with profound shortness of breath, wheezing, and hypoxemia. This intially started on 4/19, and she was seen at [facility]. She was prescribed steroids, but only took 2-3 days worth because she developed diarrhea which she associated with the steroids. Since that time, her wheezing has worsened and her need for daily albuterol has increased to 5-6 times a day. She could not move her peak flow meter above 60 (persona best > 500). She has not missed any of her home advair or claritin. She also denies being sick and thinks that her trigger is weather related. Currently she notes fatigue with her breathing and chest pain with inspiration as well as a productive cough.   The patient was initially seen in the ED at [facility] where she was given continuous albuterol for 2 hours, as well as ipratropium, magnesium, and solumedrol. Thereafter, she was still wheezing and saturating 89% on room air. She was transferred to the Step Down unit at [facility] for continued care.    Patient Active Problem List  Diagnoses  . Overweight  . DEPRESSION, MAJOR, RECURRENT  . ASTHMA, PERSISTENT  . ECZEMA  . FATIGUE  . Palpitation  . Asthma exacerbation  . Vaginal Discharge   Past Medical History: Past Medical History  Diagnosis Date  . Asthma     Started as a child; Has been intubated for asthma exacerbation in approx 1990    Past Surgical History: Past Surgical History  Procedure Date  . Tubal ligation   . Hand     flexor tendon repair    Social History: History   Social History  . Marital Status: Single    Spouse Name: N/A    Number of Children: N/A   . Years of Education: N/A   Social History Main Topics  . Smoking status: Never Smoker   . Smokeless tobacco: Never Used  . Alcohol Use: No  . Drug Use: No  . Sexually Active: None   Other Topics Concern  . None   Social History Narrative   Lives alone.  Has a dog, does not feel like is a trigger.  Allergy to cats.  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 12th grade education.    Family History: Family History  Problem Relation Age of Onset  . Asthma Mother   . Heart disease Father 60    MI  . Alcohol abuse Father   . Asthma Sister 9    death from asthma    Allergies: No Known Allergies  Home Meds: Advair 500-50 1 puff BID Albuterol nebulizer and inhaler PRN Claritin 10 mg daily Melatonin   Review Of Systems: Per HPI with the following additions:  Otherwise 12 point review of systems was performed and was unremarkable.  Physical Exam: BP 144/77  Pulse 121  Temp(Src) 98.3 F (36.8 C) (Oral)  Resp 27  Ht 5\' 6"  (1.676 m)  Wt 159 lb 6.3 oz (72.3 kg)  BMI 25.73 kg/m2  SpO2 98% General: alert, cooperative and moderate distress, appears younger than stated age  HEENT: PERRLA, extra ocular movement intact, sclera clear, anicteric and nasal canula in place Heart: sinus tachycardia Lungs: audible wheezes without  stethoscope, expiratory wheezes and increased work of breathing with supraclavicular retractions and nasal flaring  Abdomen: abdomen is soft without significant tenderness, masses, organomegaly or guarding Extremities: extremities normal, atraumatic, no cyanosis or edema Skin:no rashes, no ecchymoses Neurology: normal without focal findings, mental status, speech normal, alert and oriented x3 and PERLA  Labs and Imaging:  Results for orders placed during the hospital encounter of 02/24/12 (from the past 24 hour(s))  CBC     Status: Normal   Collection Time   02/24/12 12:10 PM      Component Value Range   WBC 5.6  4.0 - 10.5 (K/uL)    RBC 4.84  3.87 - 5.11 (MIL/uL)   Hemoglobin 13.7  12.0 - 15.0 (g/dL)   HCT 16.1  09.6 - 04.5 (%)   MCV 82.0  78.0 - 100.0 (fL)   MCH 28.3  26.0 - 34.0 (pg)   MCHC 34.5  30.0 - 36.0 (g/dL)   RDW 40.9  81.1 - 91.4 (%)   Platelets 314  150 - 400 (K/uL)  DIFFERENTIAL     Status: Abnormal   Collection Time   02/24/12 12:10 PM      Component Value Range   Neutrophils Relative 35 (*) 43 - 77 (%)   Neutro Abs 2.0  1.7 - 7.7 (K/uL)   Lymphocytes Relative 50 (*) 12 - 46 (%)   Lymphs Abs 2.8  0.7 - 4.0 (K/uL)   Monocytes Relative 8  3 - 12 (%)   Monocytes Absolute 0.4  0.1 - 1.0 (K/uL)   Eosinophils Relative 6 (*) 0 - 5 (%)   Eosinophils Absolute 0.4  0.0 - 0.7 (K/uL)   Basophils Relative 1  0 - 1 (%)   Basophils Absolute 0.0  0.0 - 0.1 (K/uL)  BASIC METABOLIC PANEL     Status: Abnormal   Collection Time   02/24/12 12:10 PM      Component Value Range   Sodium 136  135 - 145 (mEq/L)   Potassium 3.9  3.5 - 5.1 (mEq/L)   Chloride 103  96 - 112 (mEq/L)   CO2 23  19 - 32 (mEq/L)   Glucose, Bld 98  70 - 99 (mg/dL)   BUN 9  6 - 23 (mg/dL)   Creatinine, Ser 7.82  0.50 - 1.10 (mg/dL)   Calcium 9.4  8.4 - 95.6 (mg/dL)   GFR calc non Af Amer 78 (*) >90 (mL/min)   GFR calc Af Amer >90  >90 (mL/min)   Chest X-ray: no acute cardiopulmonary disease   Assessment and Plan: Lydia Griffin is a 51 y.o. year old female presenting with an acute asthma exacerbation in the setting of severe persistent asthma and no evidence of respiratory infection.    1. Asthma - Continue albuterol nebulizer q 4/2 PRN with prednisone 50 mg daily; consider CCM consult if worsening condition 2. FEN/GI: regular diet as tolerated, however will leave on MIVF in case she has trouble eating and drinking 2/2 to dyspnea  3. Prophylaxis: Protonix; Lovenox  4. Disposition: Admission to step down unit given clinical picture and need for constant observation  R3 addendum  PE: BP 110/54  Pulse 105  Temp(Src) 97.8 F (36.6 C)  (Oral)  Resp 18  Ht 5\' 6"  (1.676 m)  Wt 159 lb 6.3 oz (72.3 kg)  BMI 25.73 kg/m2  SpO2 98%  Gen- breathing rapidly with audible wheeze but speaking in full sentences HEENT- MMM, nasal passages clear, sclera anicteric RESP- wheezes inspiratory and  expiratory, coarse breath sounds CV- tachycardic, no murmur ABD- soft, NT, ND EXT- warm, no edema  A/P 51 y/o AAF with significant hx of asthma exacerbations presents with asthma exacerbation. 1. Asthma exacerbation- significant wheezing after treatment in ED and one treatment of albuterol/atrovent here at [facility].  Steroids given 4 hours ago.  Admitted to stepdown with q2 hour nebulizer treatments and close observation.  CXR not concerning for infectious process and neither is hx.  Will give prednisone 50mg  qAM.  Would have low threshold to involve CCM if pt does not seem improved in the next few hours.   2. FEN/GI- pt would like to eat, so have ordered her a regular diet.  If her breathing worsens, would make her NPO for possible intubation.  KVO fluids if pt is taking good PO.   3. PPX- heparin 5000units sq 4. Dispo- pending clinical improvement.   Juri Dinning 11:42 PM

## 2012-02-24 NOTE — ED Notes (Signed)
Pt presenting to ed with c/o asthma exacerbation pt states onset x 2 days ago. Pt denies chest pain at this time. Pt states she has been using inhalers but they are not helping.

## 2012-02-25 LAB — BASIC METABOLIC PANEL
CO2: 22 mEq/L (ref 19–32)
Chloride: 101 mEq/L (ref 96–112)
Creatinine, Ser: 0.68 mg/dL (ref 0.50–1.10)
GFR calc Af Amer: >90 mL/min (ref >90)
Potassium: 4.6 mEq/L (ref 3.5–5.1)
Sodium: 133 mEq/L — ABNORMAL LOW (ref 135–145)

## 2012-02-25 MED ORDER — ALBUTEROL SULFATE (5 MG/ML) 0.5% IN NEBU
2.5000 mg | INHALATION_SOLUTION | RESPIRATORY_TRACT | Status: DC
  Administered 2012-02-25 (×3): 2.5 mg via RESPIRATORY_TRACT
  Filled 2012-02-25 (×3): qty 0.5

## 2012-02-25 MED ORDER — LORATADINE 10 MG PO TABS
10.0000 mg | ORAL_TABLET | Freq: Every day | ORAL | Status: DC
  Administered 2012-02-25 – 2012-02-28 (×4): 10 mg via ORAL
  Filled 2012-02-25 (×4): qty 1

## 2012-02-25 MED ORDER — IPRATROPIUM BROMIDE 0.02 % IN SOLN
0.5000 mg | RESPIRATORY_TRACT | Status: DC
  Administered 2012-02-26 – 2012-02-28 (×13): 0.5 mg via RESPIRATORY_TRACT
  Filled 2012-02-25 (×15): qty 2.5

## 2012-02-25 MED ORDER — ALBUTEROL SULFATE (5 MG/ML) 0.5% IN NEBU: 2.5000 mg | INHALATION_SOLUTION | RESPIRATORY_TRACT | Status: DC | PRN

## 2012-02-25 MED ORDER — ALBUTEROL SULFATE (5 MG/ML) 0.5% IN NEBU
2.5000 mg | INHALATION_SOLUTION | RESPIRATORY_TRACT | Status: DC
  Administered 2012-02-26 (×4): 2.5 mg via RESPIRATORY_TRACT
  Filled 2012-02-25 (×5): qty 0.5

## 2012-02-25 MED ORDER — IPRATROPIUM BROMIDE 0.02 % IN SOLN
0.5000 mg | RESPIRATORY_TRACT | Status: DC
Start: 2012-02-25 — End: 2012-02-25
  Administered 2012-02-25 (×2): 0.5 mg via RESPIRATORY_TRACT
  Filled 2012-02-25 (×2): qty 2.5

## 2012-02-25 NOTE — Progress Notes (Signed)
Family Medicine Teaching Service Brief Progress Note: S: Came to check in on patient to assess work of breathing and symptoms. Patient states that the q2 treatments are helping and that she is slowly starting to feel better, although shortness of breath is worsened with activity such as moving from the bed to the commode. Peek flow is 150 compared to 120 this am and 500 as best number.  Last duoneb treatment: 5pm O: RR:17, O2 sat: 100% on 3L,  Gen: alert and oriented, talkative and in good spirits Pulm: expiratory and inspiratory wheezing bilaterally with coarse breath sounds, air movement heard throughout lung fields, talking in full sentences A/P: 51 yo female with h/o asthma and previous intubation for asthma who presented with asthma exacerbation. - continue duonebs q2/q1  - stay on step down unit over night - continue O2 therapy - will kvo fluids since she is eating and drinking well.

## 2012-02-25 NOTE — Progress Notes (Signed)
FMTS Attending Daily Note: Lydia Baines MD [phone] pager office [phone] I have discussed this patient with the resident and reviewed the assessment and plan as documented above. I agree wit the resident's findings and plan.  

## 2012-02-25 NOTE — H&P (Signed)
FMTS Attending Admission Note: Lydia Levy MD [phone] pager office [phone] I  have seen and examined this patient, reviewed their chart. I have discussed this patient with the resident. I agree with the resident's findings, assessment and care plan. Still inspiratory and expiratory wheezes on my exam this am, some mild retractions neck muscles. Talking in complete sentences. Very talkative. Hs of prior intubation. I recommend we keep her in step down unit level care if possible overnight.

## 2012-02-25 NOTE — Progress Notes (Signed)
Family Medicine Teaching Service Daily Progress Note: pager: [phone] Subjective: Shortness of breath improved but still present. Peak flow of 120 today compared to her best of 500. Cough is non productive, sill present. Breathing treatments helping a little. Complains of chest pain with coughing, otherwise no pain with activity. No nausea, no vomiting.   Objective: Vital signs in last 24 hours: Temp:  [97.6 F (36.4 C)-98.3 F (36.8 C)] 97.6 F (36.4 C) (05/05 0300) Pulse Rate:  [73-121] 73  (05/05 0800) Resp:  [18-27] 25  (05/05 0800) BP: (110-144)/(54-80) 136/76 mmHg (05/05 0800) SpO2:  [92 %-100 %] 100 % (05/05 0836) Weight:  [159 lb 6.3 oz (72.3 kg)] 159 lb 6.3 oz (72.3 kg) (05/04 1650) Weight change:     Intake/Output from previous day: 05/04 0701 - 05/05 0700 In: 100 [I.V.:100] Out: 225 [Urine:225] Intake/Output this shift: Total I/O In: 220 [P.O.:120; I.V.:100] Out: -   General appearance: alert, cooperative and speaking in full sentences with brief pauses for deeper breath. Resp: diffuse rhonchi and expiratory and inspiratory wheezing bilaterally, retractions along scalene muscles, respiratory rate in mid 20's, O2 sat: 98% on room air Cardio: regular rate and rhythm, S1, S2 normal, no murmur, click, rub or gallop GI: soft, non-tender; bowel sounds normal; no masses,  no organomegaly Extremities: extremities normal, atraumatic, no cyanosis or edema Pulses: 2+ and symmetric Skin: Skin color, texture, turgor normal. No rashes or lesions  Lab Results:  Basename 02/24/12 1210  WBC 5.6  HGB 13.7  HCT 39.7  PLT 314   BMET  Basename 02/25/12 0345 02/24/12 1210  NA 133* 136  K 4.6 3.9  CL 101 103  CO2 22 23  GLUCOSE 126* 98  BUN 7 9  CREATININE 0.68 0.85  CALCIUM 9.5 9.4    Studies/Results: Dg Chest Portable 1 View  02/24/2012  *RADIOLOGY REPORT*  Clinical Data: Asthma, shortness of breath  PORTABLE CHEST - 1 VIEW  Comparison: November 15, 2011  Findings: The  cardiac silhouette, mediastinum, pulmonary vasculature are within normal limits.  Both lungs are clear except for mild, diffuse interstitial prominence, unchanged.   There is no acute bony abnormality.  IMPRESSION: Stable chest x-ray with no evidence of acute cardiac or pulmonary process.  Original Report Authenticated By: Brandon Melnick, M.D.    Medications:  Scheduled:   . albuterol  2.5 mg Nebulization NOW  . albuterol  2.5 mg Nebulization Q4H  . albuterol  10 mg/hr Nebulization Once  . albuterol  10 mg/hr Nebulization Once  . enoxaparin  40 mg Subcutaneous QHS  . ipratropium  0.5 mg Nebulization Q4H  . magnesium sulfate 1 - 4 g bolus IVPB  2 g Intravenous Once  . methylPREDNISolone (SOLU-MEDROL) injection  125 mg Intravenous Once  . pantoprazole  40 mg Oral Q1200  . predniSONE  50 mg Oral Q breakfast  . sodium chloride  1,000 mL Intravenous Once  . DISCONTD: albuterol  2.5 mg Nebulization Q4H  . DISCONTD: albuterol      . DISCONTD: albuterol  10 mg/hr Nebulization Once   Continuous:   . 0.45 % NaCl with KCl 20 mEq / L 100 mL/hr at 02/25/12 0800   Assessment/Plan: 51 y.o. year old female with h/p asthma who presented with acute asthma exacerbation in the setting of severe persistent asthma and no evidence of respiratory infection. 1. Asthma Exacerbation: work of breathing improved since admission s/p solumedrol, duonebs and magnesium although patient continues having persistent wheezing and coarse breath sounds with  use of accessory muscles.  Change to duonebs q2hr  Prednisone 50mg  05/05>>>  O2 for sats<93%  Patient will be getting insurance soon and may be able to afford singulair which has helped her in the past. Will consider discharging her on singulair and advair with potential pulmonology referral. In the meantime, will double claritin. 2. Chest pain: most likely musculoskeletal from coughing. Troponin negative x1 and normal EKG are reassuring in excluding a cardiac  process.   Continue monitoring on tele 3. Fen/Gi: normal diet. Consider stopping fluids today if work of breathing continues improving and patient able to eat.  4. Prophylaxis: protonix and lovenox 5. Disposition: step down unit until work of breathing improves. Potential transfer to floor today.   LOS: 1 day   Clerence Gubser 02/25/2012, 10:36 AM

## 2012-02-26 LAB — BASIC METABOLIC PANEL
CO2: 24 mEq/L (ref 19–32)
Calcium: 9.3 mg/dL (ref 8.4–10.5)
Creatinine, Ser: 0.95 mg/dL (ref 0.50–1.10)
GFR calc non Af Amer: 68 mL/min — ABNORMAL LOW (ref >90)
Sodium: 136 mEq/L (ref 135–145)

## 2012-02-26 MED ORDER — ALBUTEROL SULFATE (5 MG/ML) 0.5% IN NEBU
2.5000 mg | INHALATION_SOLUTION | RESPIRATORY_TRACT | Status: DC
  Administered 2012-02-26 – 2012-02-28 (×9): 2.5 mg via RESPIRATORY_TRACT
  Filled 2012-02-26 (×10): qty 0.5

## 2012-02-26 MED ORDER — ALBUTEROL SULFATE (5 MG/ML) 0.5% IN NEBU
2.5000 mg | INHALATION_SOLUTION | RESPIRATORY_TRACT | Status: DC
  Administered 2012-02-26: 2.5 mg via RESPIRATORY_TRACT
  Filled 2012-02-26: qty 0.5

## 2012-02-26 NOTE — Progress Notes (Signed)
02/26/2012 5:22 PM Ginger Organ  [id number]  Report called to [name],RN on receiving unit,5000. VSS. Transferred with belongings to new room,5017. Family informed.  Lydia Griffin Annette 5/6/20135:22 PM

## 2012-02-26 NOTE — Progress Notes (Signed)
Family Medicine Teaching Service Daily Progress Note: pager: [phone] Subjective: Feels like shortness of breath is better than on admission although she still gets tired with transition from the bed to the commode. + green productive cough, more than yesterday. Feels like breathing treatments are working. No nausea, no vomiting. Eating a full diet.  Was on duonebs q2 during the day yesterday which was switched to q4 overnight.  Objective: Vital signs in last 24 hours: Temp:  [97.6 F (36.4 C)-98.3 F (36.8 C)] 98.3 F (36.8 C) (05/06 0412) Pulse Rate:  [68-95] 70  (05/06 0412) Resp:  [15-26] 19  (05/06 0412) BP: (105-126)/(60-79) 113/64 mmHg (05/06 0412) SpO2:  [98 %-100 %] 98 % (05/06 0814) Weight change:    Intake/Output from previous day: 05/05 0701 - 05/06 0700 In: 1860 [P.O.:960; I.V.:900] Out: 1000 [Urine:1000] Intake/Output this shift:   General appearance: alert, cooperative, very pleasant and speaking in full sentences without significant distress.  Resp: diffuse rhonchi and expiratory and inspiratory wheezing bilaterally, respiratory rate 18, O2 sat: 98% on 2L, no significant increased work of breathing. Cardio: tachycardic in the 90's s/p albuterol treatment, S1, S2 normal, no murmur, click, rub or gallop GI: soft, non-tender; bowel sounds normal; no masses,  no organomegaly Extremities: extremities normal, atraumatic, no cyanosis or edema Pulses: 2+ and symmetric Skin: Skin color, texture, turgor normal. No rashes or lesions  Lab Results:  Basename 02/24/12 1210  WBC 5.6  HGB 13.7  HCT 39.7  PLT 314   BMET  Basename 02/26/12 0420 02/25/12 0345  NA 136 133*  K 4.1 4.6  CL 104 101  CO2 24 22  GLUCOSE 89 126*  BUN 17 7  CREATININE 0.95 0.68  CALCIUM 9.3 9.5    Studies/Results: Dg Chest Portable 1 View  02/24/2012  *RADIOLOGY REPORT*  Clinical Data: Asthma, shortness of breath  PORTABLE CHEST - 1 VIEW  Comparison: November 15, 2011  Findings: The cardiac  silhouette, mediastinum, pulmonary vasculature are within normal limits.  Both lungs are clear except for mild, diffuse interstitial prominence, unchanged.   There is no acute bony abnormality.  IMPRESSION: Stable chest x-ray with no evidence of acute cardiac or pulmonary process.  Original Report Authenticated By: Brandon Melnick, M.D.    Medications:  Scheduled:    . albuterol  2.5 mg Nebulization Q4H  . enoxaparin  40 mg Subcutaneous QHS  . ipratropium  0.5 mg Nebulization Q4H  . loratadine  10 mg Oral Daily  . pantoprazole  40 mg Oral Q1200  . predniSONE  50 mg Oral Q breakfast  . DISCONTD: albuterol  2.5 mg Nebulization Q4H  . DISCONTD: albuterol  2.5 mg Nebulization Q2H  . DISCONTD: ipratropium  0.5 mg Nebulization Q4H  . DISCONTD: ipratropium  0.5 mg Nebulization Q2H   Continuous:    . DISCONTD: 0.45 % NaCl with KCl 20 mEq / L 100 mL/hr at 02/25/12 1600   Assessment/Plan: 51 y.o. year old female with h/p asthma who presented with acute asthma exacerbation in the setting of severe persistent asthma and no evidence of respiratory infection. 1. Asthma Exacerbation: s/p magnesium, solumedrol in the ED. Now on prednisone 50mg  daily and duonebs. Oxygen requirement is improving and work of breathing appears better, although she continues being short of breath with minimal activity. No evidence of superimposed infection, as patient has been afebrile and oxygen requirement has improved.   Will check Peak flow to determine frequency of neb treatments: q2 vs q4 today  Prednisone 50mg   05/05>>>  Wean oxygen as tolerated  claritin 20mg  for allergies. Patient may benefit from singulair which she will be able to afford once her insurance becomes active. 2. Chest pain: resolved. most likely musculoskeletal from coughing, unlikely cardiac etiology given normal CE x1 and EKG   Continue monitoring on tele 3. Fen/Gi: normal diet. Fluids KVOed 4. Prophylaxis: protonix and  lovenox 5. Disposition: step down unit and consider transferring to regular floor this afternoon if work of breathing improves.   LOS: 2 days   Latroya Ng 02/26/2012, 8:52 AM

## 2012-02-26 NOTE — Progress Notes (Signed)
I have seen and examined this patient. I have discussed with Dr Gwenlyn Saran.  I agree with their findings and plans as documented in their progress note for today.  Acute Issues  1. Asthma exacerbation - Improving. Comfortable breathing at rest, but significantly dyspnic with ambulation to bathroom. - Multiple possible precipitants, including extrinsic vernal allergies, URI, and weather change. - Patient had stopped her Advair with the beginning of the exacerbation because she thought that she was not to use Advair during an exacerbation. I could find only one Advair 500-50 diskus prescribed since 11/12/10 in her EPIC Ambulatory record.  - Patient had used 2 MDI albuterol  inhalers in less than a month with this exacerbation.  - Patient had stopped her recent oral prednisone that she had received as an outpatient from the [facility] for this exacerbation because she developed diarrhea.  Acute diarrhea is not listed as a common adverse effect of prednisone.  - Plan: Complete a 10 day course of prednisone ~ 1 mg/Kg for total of seven days.  Inquire into patient's ability to obtain and use Advair- has she obtained it from non-[facility] prescribers?   Patient education about preventive role of Advair and importance of   continuing Advair during acute exacerbations and instruction to contact [facility] if stopping prednisone for possible adverse effects.   Patient would like to return to see her Allergist now that she now has health insurance.  She has been seen at the [facility] Allergy Department.  Will set up appointment for patient with them at discharge.

## 2012-02-26 NOTE — Progress Notes (Signed)
02/26/2012 3:43 PM 2 nurses attempted PIV insertion and were unsuccessful. Paged IV team. IV team wants to be notified when patient is in new room on 3000. WIll let 3000 RN know. Celesta Gentile

## 2012-02-26 NOTE — Progress Notes (Signed)
Per MD if pt peak flow falls below 50% of pt best change neb tx's to Q2.

## 2012-02-27 ENCOUNTER — Encounter (HOSPITAL_COMMUNITY): Payer: Self-pay | Admitting: Sports Medicine

## 2012-02-27 LAB — BASIC METABOLIC PANEL
Calcium: 9.4 mg/dL (ref 8.4–10.5)
GFR calc Af Amer: 87 mL/min — ABNORMAL LOW (ref >90)
GFR calc non Af Amer: 75 mL/min — ABNORMAL LOW (ref >90)
Glucose, Bld: 77 mg/dL (ref 70–99)
Sodium: 137 mEq/L (ref 135–145)

## 2012-02-27 MED ORDER — FLUTICASONE-SALMETEROL 500-50 MCG/DOSE IN AEPB
1.0000 | INHALATION_SPRAY | Freq: Two times a day (BID) | RESPIRATORY_TRACT | Status: DC
  Administered 2012-02-27 – 2012-02-28 (×2): 1 via RESPIRATORY_TRACT
  Filled 2012-02-27: qty 14

## 2012-02-27 NOTE — Progress Notes (Signed)
I have seen and examined this patient. I have discussed with [place].  I agree with their findings and plans as documented in their progress note for today. Improving Asthma exacerbation but still functionally limited by exacerbation today. Anticipate Discharge tomorrow.  Need to make sure that patient will be able to get Advair at discharge. Will send patient home with Advair diskus from this hospitalization.

## 2012-02-27 NOTE — Progress Notes (Signed)
Family Medicine Teaching Service Daily Progress Note: pager: [phone] Subjective: Feels better since yesterday. Has been able to walk to the bathroom with some shortness of breath and need for breaks. Weaned to room air since yesterday. Transferred from step down to regular floor yesterday Albuterol q4  Objective: Vital signs in last 24 hours: Temp:  [97.8 F (36.6 C)-98.8 F (37.1 C)] 97.8 F (36.6 C) (05/07 0619) Pulse Rate:  [70-90] 70  (05/07 0619) Resp:  [18-22] 18  (05/07 0619) BP: (109-140)/(65-76) 109/68 mmHg (05/07 0619) SpO2:  [91 %-97 %] 91 % (05/07 0816) Weight change:  Last BM Date: 02/26/12 Intake/Output from previous day: 05/06 0701 - 05/07 0700 In: 720 [P.O.:720] Out: 250 [Urine:250] Intake/Output this shift:   General appearance: alert, cooperative, very pleasant and speaking in full sentences without significant distress.  Resp: diffuse rhonchi and expiratory and inspiratory wheezing bilaterally improved since yesterday, normal work of breathing, no significant increased work of breathing. Cardio: RRR, S1, S2 normal, no murmur, click, rub or gallop GI: soft, non-tender; bowel sounds normal; no masses,  no organomegaly Extremities: extremities normal, atraumatic, no cyanosis or edema Pulses: 2+ and symmetric Skin: Skin color, texture, turgor normal. No rashes or lesions  Lab Results:  Basename 02/24/12 1210  WBC 5.6  HGB 13.7  HCT 39.7  PLT 314   BMET  Basename 02/27/12 0650 02/26/12 0420  NA 137 136  K 3.7 4.1  CL 103 104  CO2 26 24  GLUCOSE 77 89  BUN 16 17  CREATININE 0.88 0.95  CALCIUM 9.4 9.3    Studies/Results: No results found.  Medications:  Scheduled:    . albuterol  2.5 mg Nebulization Q4H  . enoxaparin  40 mg Subcutaneous QHS  . ipratropium  0.5 mg Nebulization Q4H  . loratadine  10 mg Oral Daily  . pantoprazole  40 mg Oral Q1200  . predniSONE  50 mg Oral Q breakfast  . DISCONTD: albuterol  2.5 mg Nebulization Q4H  .  DISCONTD: albuterol  2.5 mg Nebulization Q2H   Continuous:   Assessment/Plan: 51 y.o. year old female with h/p asthma who presented with acute asthma exacerbation in the setting of severe persistent asthma and no evidence of respiratory infection. 1. Asthma Exacerbation: s/p magnesium, solumedrol in the ED. Now on prednisone 50mg  daily and duonebs. Now on room air with improved work of breathing and increase in activity level compared to yesterday. She does continue having some fatigue and shortness of breath with short distances. Still no evidence of superimposed infection, as patient has been afebrile and no longer requires oxygen. Peak flow ranges between 180-250 with her baseline being 500.   Continue peak flow monitoring  Continue duonebs q4  Prednisone 50mg  05/05>>>  claritin 20mg  for allergies. Patient may benefit from singulair which she will be able to afford once her insurance becomes active. 2. Fen/Gi: normal diet. Fluids KVOed 3. Prophylaxis: protonix and lovenox 4. Disposition: most likely tomorrow pending improved activity level.   LOS: 3 days   Masaki Rothbauer 02/27/2012, 9:54 AM

## 2012-02-28 DIAGNOSIS — F329 Major depressive disorder, single episode, unspecified: Secondary | ICD-10-CM

## 2012-02-28 DIAGNOSIS — F3289 Other specified depressive episodes: Secondary | ICD-10-CM

## 2012-02-28 DIAGNOSIS — J45901 Unspecified asthma with (acute) exacerbation: Secondary | ICD-10-CM

## 2012-02-28 LAB — CBC
MCH: 27.7 pg (ref 26.0–34.0)
MCHC: 33.3 g/dL (ref 30.0–36.0)
Platelets: 313 10*3/uL (ref 150–400)
RBC: 4.77 MIL/uL (ref 3.87–5.11)

## 2012-02-28 LAB — BASIC METABOLIC PANEL
Calcium: 8.9 mg/dL (ref 8.4–10.5)
GFR calc non Af Amer: 77 mL/min — ABNORMAL LOW (ref >90)
Potassium: 3.5 mEq/L (ref 3.5–5.1)
Sodium: 135 mEq/L (ref 135–145)

## 2012-02-28 MED ORDER — PREDNISONE 50 MG PO TABS: 50.0000 mg | ORAL_TABLET | Freq: Every day | ORAL | Status: AC

## 2012-02-28 MED ORDER — BISACODYL 5 MG PO TBEC
10.0000 mg | DELAYED_RELEASE_TABLET | Freq: Every day | ORAL | Status: DC
  Administered 2012-02-28: 10 mg via ORAL
  Filled 2012-02-28: qty 2

## 2012-02-28 NOTE — Discharge Summary (Signed)
Physician Discharge Summary  Patient ID: Lydia Griffin MRN: [id number] DOB/AGE: 1961/08/26 51 y.o.  Admit date: 02/24/2012 Discharge date: 02/28/2012  PCP: Lydia Harness, MD  Admission Diagnoses: Asthma Exacerbation  Discharge Diagnoses:  Active Problems:  ASTHMA, PERSISTENT  Asthma exacerbation  Discharged Condition: stable  Hospital Course:  Lydia Griffin is a 51 y.o. year old female who presented with acute asthma exacerbation in the setting of severe persistent asthma and no evidence of respiratory infection.   Asthma Exacerbation: The trigger may have been mold in her apartment. The patient was given continuous albuterol x 2 hours, magnesium, and solumedrol in the ED. Thereafter, she was still hypoxemic to 89% with increased work of breathing. Therefore, she was admitted to the step-down units. She required oxygen and albuterol every 2 hours for 2 days for symptom control. She was also given prednisone 50 mg PO daily and restarted her home Advair. After five days, she was able to ambulate without significant dyspnea and was appropriate for discharge. Her peak flow was still about 50% of her personal best, but she was improving and was discharged with on 5 more days of prednisone as well as albuterol, advair, and claritin.   Consults: None  Significant Diagnostic Studies:   See HPI  Discharge Exam: Blood pressure 115/71, pulse 86, temperature 98.2 F (36.8 C), temperature source Oral, resp. rate 16, height 5\' 6"  (1.676 m), weight 159 lb 6.3 oz (72.3 kg), SpO2 95.00%. General appearance: alert, cooperative, very pleasant and speaking in full sentences without significant distress.  Resp: diffuse rhonchi and expiratory and inspiratory wheezing bilaterally improved since yesterday, normal work of breathing, no significant increased work of breathing.  Cardio: RRR, S1, S2 normal, no murmur, click, rub or gallop  GI: soft, non-tender; bowel sounds normal; no masses, no organomegaly    Extremities: extremities normal, atraumatic, no cyanosis or edema  Pulses: 2+ and symmetric  Skin: Skin color, texture, turgor normal. No rashes or lesions  Disposition: 01-Home or Self Care  Discharge Orders    Future Appointments: Provider: Department: Dept Phone: Center:   03/04/2012 11:00 AM Lydia Mis, MD [facility] [phone] [facility]     Future Orders Please Complete By Expires   Diet - low sodium heart healthy      Increase activity slowly        Medication List  As of 02/28/2012  7:21 PM   TAKE these medications         ADVAIR DISKUS 500-50 MCG/DOSE Aepb   Generic drug: Fluticasone-Salmeterol   Inhale 1 puff into the lungs 2 (two) times daily.      albuterol (5 MG/ML) 0.5% nebulizer solution   Commonly known as: PROVENTIL   Take 2.5 mg by nebulization every 4 (four) hours as needed. For shortness of breath      albuterol 90 MCG/ACT inhaler   Commonly known as: PROVENTIL,VENTOLIN   Inhale 2 puffs into the lungs every 4 (four) hours as needed. For shortness of breath      loratadine 10 MG tablet   Commonly known as: CLARITIN   Take 10 mg by mouth daily.      mulitivitamin with minerals Tabs   Take 1 tablet by mouth daily.      predniSONE 50 MG tablet   Commonly known as: DELTASONE   Take 1 tablet (50 mg total) by mouth daily.           Follow-up Information    Follow up with Lydia Harness, MD  in 3 days. (Monday  02/27/12 at 11am)    Contact information:   [address] [place] [address] [phone]          Signed: Mat Griffin 02/28/2012, 7:21 PM

## 2012-02-28 NOTE — Progress Notes (Signed)
Utilization review completed. Chiyeko Ferre, RN, BSN. 02/28/12  

## 2012-02-29 NOTE — Discharge Summary (Signed)
I discussed with Dr Williamson.  I agree with their plans documented in their discharge note.  

## 2012-03-04 ENCOUNTER — Ambulatory Visit (INDEPENDENT_AMBULATORY_CARE_PROVIDER_SITE_OTHER): Payer: Self-pay | Admitting: Family Medicine

## 2012-03-04 ENCOUNTER — Encounter: Payer: Self-pay | Admitting: Family Medicine

## 2012-03-04 VITALS — BP 127/80 | HR 77 | Temp 97.2°F | Ht 66.0 in | Wt 171.2 lb

## 2012-03-04 DIAGNOSIS — J45909 Unspecified asthma, uncomplicated: Secondary | ICD-10-CM

## 2012-03-04 DIAGNOSIS — J45901 Unspecified asthma with (acute) exacerbation: Secondary | ICD-10-CM

## 2012-03-04 MED ORDER — MONTELUKAST SODIUM 10 MG PO TABS: 10.0000 mg | ORAL_TABLET | Freq: Every day | ORAL | Status: DC

## 2012-03-04 MED ORDER — PREDNISONE 50 MG PO TABS: ORAL_TABLET | ORAL | Status: DC

## 2012-03-04 NOTE — Patient Instructions (Addendum)
Will extend prednisone for 5 days  Will add on [employer] job on adding exercise!!  Keep up the good work!  Follow-up for annual physical exam

## 2012-03-04 NOTE — Progress Notes (Signed)
  Subjective:    Patient ID: Lydia Griffin, female    DOB: 27-Apr-1961, 51 y.o.   MRN: [id number]  HPI Hospitalized 5/4-5/8 for asthma exacerbation  Here for follow-up- feels breathing improved, last used albuterol last night before bed. Dyspnea improved.  Last dose of prednisone is today.  Has been doing peak flows at home, notes up to 400.    Wt Readings from Last 3 Encounters:  03/04/12 171 lb 3.2 oz (77.656 kg)  02/24/12 159 lb 6.3 oz (72.3 kg)  02/09/12 160 lb (72.576 kg)   I have reviewed patient's  PMH, FH, and Social history and Medications as related to this visit. Nonsmoker- daughter who lives at home smokes  Review of Systemsno fever, chills.     Objective:   Physical Exam GEN: NAD RESP:  Normal WOB, wheezes throughout.      Assessment & Plan:

## 2012-03-04 NOTE — Assessment & Plan Note (Signed)
Multiple visits to healthcare for asthma exacerbation.  Compliance with advair not an issue.  Is now insured, will restart back on singulair to add on.  Will hope to reduce exacerbations.

## 2012-03-04 NOTE — Assessment & Plan Note (Signed)
Will extend prednisone x 5 more days

## 2012-03-06 ENCOUNTER — Telehealth: Payer: Self-pay | Admitting: Family Medicine

## 2012-03-06 NOTE — Telephone Encounter (Signed)
Patient need a work note to have dated for tomorrow.  Please fax to Attn: Rivka [name]  [phone]

## 2012-03-06 NOTE — Telephone Encounter (Signed)
Patient is calling to check to see if the note has been written.  Her workplace has let her know that they have not received the note yet.

## 2012-03-07 ENCOUNTER — Encounter: Payer: Self-pay | Admitting: Family Medicine

## 2012-03-07 NOTE — Telephone Encounter (Signed)
No answer, left message

## 2012-03-07 NOTE — Telephone Encounter (Signed)
Patient is calling back for her note for work.  She needs this to happen today in order to be able to return to work tonight.

## 2012-03-07 NOTE — Telephone Encounter (Signed)
Patient would like to speak to Dr. Earnest Bailey to be sure she is ok to go back to work.  She would like this to happen before the note is sent to her employer.

## 2012-03-08 ENCOUNTER — Telehealth: Payer: Self-pay | Admitting: Family Medicine

## 2012-03-08 ENCOUNTER — Ambulatory Visit
Admission: RE | Admit: 2012-03-08 | Discharge: 2012-03-08 | Disposition: A | Discharge status: Home / Self Care | Payer: No Typology Code available for payment source | Source: Ambulatory Visit | Attending: Family Medicine | Admitting: Family Medicine

## 2012-03-08 ENCOUNTER — Encounter: Payer: Self-pay | Admitting: Family Medicine

## 2012-03-08 ENCOUNTER — Ambulatory Visit (INDEPENDENT_AMBULATORY_CARE_PROVIDER_SITE_OTHER): Payer: Self-pay | Admitting: Family Medicine

## 2012-03-08 VITALS — BP 122/77 | HR 90 | Ht 66.0 in | Wt 163.0 lb

## 2012-03-08 DIAGNOSIS — J45901 Unspecified asthma with (acute) exacerbation: Secondary | ICD-10-CM

## 2012-03-08 MED ORDER — MOXIFLOXACIN HCL 400 MG PO TABS: 400.0000 mg | ORAL_TABLET | Freq: Every day | ORAL | Status: AC

## 2012-03-08 MED ORDER — PREDNISONE 20 MG PO TABS: ORAL_TABLET | ORAL | Status: DC

## 2012-03-08 NOTE — Assessment & Plan Note (Signed)
Still wheezing with O2 sat of 92%. With cough, increased sputum, green. No fevers. Plan to continue prednisone at treatment dose until Monday and then taper. Will treat with Avelox and sent for chest x-ray. See back on Monday.

## 2012-03-08 NOTE — Telephone Encounter (Signed)
Please call patient and let her noted she does not have a pneumonia. She has some chronic appearing changes related to her asthma. She should come in for her appointment on Monday.

## 2012-03-08 NOTE — Telephone Encounter (Signed)
.  lmovm asking pt to keep appt on Monday. Lydia Griffin, Lydia Griffin

## 2012-03-08 NOTE — Progress Notes (Signed)
  Subjective:    Patient ID: Lydia Griffin, female    DOB: 20-Jan-1961, 51 y.o.   MRN: [id number]  HPI Patient presents today with worsening asthma symptoms. She was in the hospital from 5/4-5/8 for these asthma exacerbation. She is now that on 10 days of prednisone. She is taking her albuterol, Advair, Singulair, prednisone 50 mg. She denies fevers but she has had a productive cough of green sputum. She also has green mucus from her nose. She has gone back to work but notes that she gets short of breath with exertion and has more coughing and wheezing. The albuterol helped for about one to 2 hours but then she will have symptoms again.  Her last albuterol was 3 hours ago. She is a nonsmoker Review of Systems Denies CP, HA, N/V/D, fever     Objective:   Physical Exam  GEN-mildly short of breath in no distress. Able to speak in full sentences Heart - normal rate, regular rhythm, normal S1, S2, no murmurs, rubs, clicks or gallops Lungs-coarse and wheezes throughout Abdomen - soft, nontender, nondistended, no masses or organomegaly Extremities - peripheral pulses normal, no pedal edema, no clubbing or cyanosis       Assessment & Plan:

## 2012-03-08 NOTE — Patient Instructions (Signed)
Today we'll send you for a chest x-ray. Please start the Avelox for 7 days Take the 50 mg of prednisone today Tomorrow start 3 days of 60 mg Than you will taper: 2 days at 40, 2 days at 20, 2 days at 10  Come back and see [place] on Monday for followup

## 2012-03-11 ENCOUNTER — Encounter: Payer: Self-pay | Admitting: Family Medicine

## 2012-03-11 ENCOUNTER — Ambulatory Visit (INDEPENDENT_AMBULATORY_CARE_PROVIDER_SITE_OTHER): Payer: No Typology Code available for payment source | Admitting: Family Medicine

## 2012-03-11 VITALS — BP 122/86 | HR 74 | Wt 167.1 lb

## 2012-03-11 DIAGNOSIS — J45901 Unspecified asthma with (acute) exacerbation: Secondary | ICD-10-CM

## 2012-03-11 DIAGNOSIS — J45909 Unspecified asthma, uncomplicated: Secondary | ICD-10-CM

## 2012-03-11 NOTE — Patient Instructions (Addendum)
Taper steroids over the next week  Will send you [place] allergy to follow-up on asthma  Let me know if you start feeling worse

## 2012-03-12 NOTE — Assessment & Plan Note (Signed)
Clinically improving.  Continued wheezes.  Ambulatory pulse ox 97%, peak flow still remains far below baseline.  Will taper prednisone over the next week.  Finish course of avelox.

## 2012-03-12 NOTE — Progress Notes (Signed)
  Subjective:    Patient ID: Lydia Griffin, female    DOB: 05/11/1961, 51 y.o.   MRN: [id number]  HPI Here for follow-up of asthma  Admitted 5/4-5/8 for asthma exacerbation. No intubation.    5/13:  Seen in office for follow-up prednisone extended due to continued wheezing  5/17:  Seen on office for worsening dyspnea, new onset of sputum, afebrile, was started on avelox, prednisone extended, CXR no pneumonia.  5/20: today, states feel's 90%normal. Sputum resolving.  Has not used albuterol since yesterday. Some dyspnea when active. Review of Systemssee hpi     Objective:   Physical Examvital noted. GEN: Alert & Oriented, No acute distress CV:  Regular Rate & Rhythm, no murmur Respiratory:  Normal work of breathing, end exp wheezes. Ext: no pre-tibial edema        Assessment & Plan:

## 2012-03-12 NOTE — Assessment & Plan Note (Signed)
Patient has not seen allergist recently.  Will refer back to be seen as she is now compliant with medications and continues to have repeated acute visits and hospitalizations due to poorly controlled asthma.

## 2012-04-06 ENCOUNTER — Telehealth: Payer: Self-pay | Admitting: Family Medicine

## 2012-04-06 MED ORDER — ALBUTEROL SULFATE (5 MG/ML) 0.5% IN NEBU: 2.5000 mg | INHALATION_SOLUTION | RESPIRATORY_TRACT | Status: DC | PRN

## 2012-04-06 MED ORDER — ALBUTEROL SULFATE HFA 108 (90 BASE) MCG/ACT IN AERS: 2.0000 | INHALATION_SPRAY | Freq: Four times a day (QID) | RESPIRATORY_TRACT | Status: DC | PRN

## 2012-04-06 NOTE — Telephone Encounter (Signed)
Received call that patient was out of albuterol inhaler and nebs.  Feeling like she is wheezing a little bit.  Denies shortness of breath at this time but doesn't want this to worsen. I explained to her that we typically do not call medications in but given her history of multiple hospitalizations for asthma exacerbation I would send over rx for MDI and neb solution.  She should f/u with pcp this week if not improving.  She expressed understanding.

## 2012-04-23 ENCOUNTER — Other Ambulatory Visit: Payer: Self-pay | Admitting: *Deleted

## 2012-04-23 MED ORDER — ALBUTEROL SULFATE (5 MG/ML) 0.5% IN NEBU: 2.5000 mg | INHALATION_SOLUTION | RESPIRATORY_TRACT | Status: DC | PRN

## 2012-04-24 ENCOUNTER — Ambulatory Visit: Payer: No Typology Code available for payment source | Admitting: Family Medicine

## 2012-04-24 ENCOUNTER — Emergency Department (HOSPITAL_COMMUNITY): Payer: BC Managed Care – PPO

## 2012-04-24 ENCOUNTER — Observation Stay (HOSPITAL_COMMUNITY)
Admission: EM | Admit: 2012-04-24 | Discharge: 2012-04-24 | Disposition: A | Discharge status: Home / Self Care | Payer: BC Managed Care – PPO | Attending: Emergency Medicine | Admitting: Emergency Medicine

## 2012-04-24 ENCOUNTER — Encounter (HOSPITAL_COMMUNITY): Payer: Self-pay | Admitting: Radiology

## 2012-04-24 DIAGNOSIS — J45901 Unspecified asthma with (acute) exacerbation: Principal | ICD-10-CM | POA: Insufficient documentation

## 2012-04-24 LAB — CBC
HCT: 37.5 % (ref 36.0–46.0)
MCH: 29.1 pg (ref 26.0–34.0)
MCV: 82.6 fL (ref 78.0–100.0)
Platelets: 292 10*3/uL (ref 150–400)
RDW: 14 % (ref 11.5–15.5)

## 2012-04-24 LAB — BASIC METABOLIC PANEL
BUN: 7 mg/dL (ref 6–23)
Calcium: 9.1 mg/dL (ref 8.4–10.5)
Creatinine, Ser: 0.85 mg/dL (ref 0.50–1.10)
GFR calc Af Amer: >90 mL/min (ref >90)

## 2012-04-24 MED ORDER — PREDNISONE 50 MG PO TABS: ORAL_TABLET | ORAL | Status: AC

## 2012-04-24 MED ORDER — ALBUTEROL SULFATE (5 MG/ML) 0.5% IN NEBU
5.0000 mg | INHALATION_SOLUTION | Freq: Once | RESPIRATORY_TRACT | Status: AC
  Administered 2012-04-24: 5 mg via RESPIRATORY_TRACT
  Filled 2012-04-24: qty 1

## 2012-04-24 MED ORDER — ALBUTEROL SULFATE (5 MG/ML) 0.5% IN NEBU
INHALATION_SOLUTION | RESPIRATORY_TRACT | Status: AC
  Administered 2012-04-24: 15:00:00
  Filled 2012-04-24: qty 1

## 2012-04-24 MED ORDER — IPRATROPIUM BROMIDE 0.02 % IN SOLN
RESPIRATORY_TRACT | Status: AC
  Administered 2012-04-24: 15:00:00
  Filled 2012-04-24: qty 2.5

## 2012-04-24 MED ORDER — ACETAMINOPHEN 325 MG PO TABS: 650.0000 mg | ORAL_TABLET | ORAL | Status: DC | PRN

## 2012-04-24 MED ORDER — IPRATROPIUM BROMIDE 0.02 % IN SOLN
0.5000 mg | Freq: Once | RESPIRATORY_TRACT | Status: AC
  Administered 2012-04-24: 0.5 mg via RESPIRATORY_TRACT

## 2012-04-24 MED ORDER — IPRATROPIUM BROMIDE 0.02 % IN SOLN
RESPIRATORY_TRACT | Status: AC
  Administered 2012-04-24: 0.5 mg via RESPIRATORY_TRACT
  Filled 2012-04-24: qty 2.5

## 2012-04-24 MED ORDER — IPRATROPIUM BROMIDE 0.02 % IN SOLN
0.5000 mg | Freq: Once | RESPIRATORY_TRACT | Status: AC
  Administered 2012-04-24 (×2): 0.5 mg via RESPIRATORY_TRACT
  Filled 2012-04-24: qty 2.5

## 2012-04-24 MED ORDER — ALBUTEROL SULFATE (5 MG/ML) 0.5% IN NEBU
5.0000 mg | INHALATION_SOLUTION | Freq: Once | RESPIRATORY_TRACT | Status: AC
  Administered 2012-04-24: 5 mg via RESPIRATORY_TRACT

## 2012-04-24 MED ORDER — MAGNESIUM SULFATE IN D5W 10-5 MG/ML-% IV SOLN
1.0000 g | Freq: Once | INTRAVENOUS | Status: AC
  Administered 2012-04-24: 1 g via INTRAVENOUS
  Filled 2012-04-24: qty 100

## 2012-04-24 MED ORDER — METHYLPREDNISOLONE SODIUM SUCC 125 MG IJ SOLR
125.0000 mg | Freq: Once | INTRAMUSCULAR | Status: AC
  Administered 2012-04-24: 125 mg via INTRAVENOUS
  Filled 2012-04-24: qty 2

## 2012-04-24 MED ORDER — ALBUTEROL SULFATE (5 MG/ML) 0.5% IN NEBU
INHALATION_SOLUTION | RESPIRATORY_TRACT | Status: AC
  Administered 2012-04-24: 15:00:00
  Filled 2012-04-24: qty 0.5

## 2012-04-24 MED ORDER — ALBUTEROL SULFATE (5 MG/ML) 0.5% IN NEBU
2.5000 mg | INHALATION_SOLUTION | Freq: Once | RESPIRATORY_TRACT | Status: AC
  Administered 2012-04-24: 2.5 mg via RESPIRATORY_TRACT

## 2012-04-24 MED ORDER — ALBUTEROL SULFATE (5 MG/ML) 0.5% IN NEBU
5.0000 mg | INHALATION_SOLUTION | Freq: Once | RESPIRATORY_TRACT | Status: AC
  Administered 2012-04-24: 5 mg via RESPIRATORY_TRACT
  Filled 2012-04-24: qty 0.5

## 2012-04-24 NOTE — ED Notes (Signed)
MD at bedside. 

## 2012-04-24 NOTE — ED Provider Notes (Signed)
History     CSN: [id number]  Arrival date & time 04/24/12  0729   First MD Initiated Contact with Patient 04/24/12 0740      Chief Complaint  Patient presents with  . Asthma    (Consider location/radiation/quality/duration/timing/severity/associated sxs/prior treatment) Patient is a 51 y.o. female presenting with asthma. The history is provided by the patient.  Asthma This is a chronic problem.  PMH includes only asthma, which is severe enough to have warranted many hospitalizations and ICU stays, last intubation as a young adult. Frequent steroid tx needed, most recently in the last month. Today with c/o asthma worsening over the last week with SOB, wheezing. + cough with only small amt clear/white sputum production. Denies fever, chills, CP. No recent illness otherwise. Has been using her home adviar, albuterol inhaler, and albuterol nebulized treatments without relief. Non-smoker, though her daughter has recently moved into the home and smokes outside. She denies known aggravating factors otherwise.   Past Medical History  Diagnosis Date  . Asthma     Started as a child; Has been intubated for asthma exacerbation in approx 1990    Past Surgical History  Procedure Date  . Tubal ligation   . Hand     flexor tendon repair    Family History  Problem Relation Age of Onset  . Asthma Mother   . Heart disease Father 23    MI  . Alcohol abuse Father   . Asthma Sister 9    death from asthma    History  Substance Use Topics  . Smoking status: Never Smoker   . Smokeless tobacco: Never Used  . Alcohol Use: No     Review of Systems 10 systems reviewed and are negative for acute change except as noted in the HPI.  Allergies  Review of patient's allergies indicates no known allergies.  Home Medications   Current Outpatient Rx  Name Route Sig Dispense Refill  . ALBUTEROL SULFATE (5 MG/ML) 0.5% IN NEBU Nebulization Take 0.5 mLs (2.5 mg total) by nebulization every 4  (four) hours as needed. For shortness of breath 20 mL 0  . ALBUTEROL 90 MCG/ACT IN AERS Inhalation Inhale 2 puffs into the lungs every 4 (four) hours as needed. For shortness of breath    . VITAMIN B 12 PO Sublingual Place 1 tablet under the tongue daily.    [name] Kitchen FLUTICASONE-SALMETEROL 500-50 MCG/DOSE IN AEPB Inhalation Inhale 1 puff into the lungs 2 (two) times daily.      [name] Kitchen LORATADINE 10 MG PO TABS Oral Take 10 mg by mouth daily.     [name] Kitchen MONTELUKAST SODIUM 10 MG PO TABS Oral Take 1 tablet (10 mg total) by mouth at bedtime. 30 tablet 11  . ADULT MULTIVITAMIN W/MINERALS CH Oral Take 1 tablet by mouth daily.      BP 103/67  Pulse 73  Temp 98.1 F (36.7 C) (Oral)  Resp 20  SpO2 96%  Physical Exam  Nursing note reviewed. Constitutional: She appears well-developed and well-nourished.       Vital signs are reviewed and are normal.  HENT:  Head: Normocephalic and atraumatic.  Right Ear: External ear normal.  Left Ear: External ear normal.  Mouth/Throat: Oropharynx is clear and moist.       MMM.   Eyes: Conjunctivae are normal. Pupils are equal, round, and reactive to light.  Neck: Neck supple.  Cardiovascular: Normal rate, regular rhythm and normal heart sounds.   No murmur heard. Pulmonary/Chest:  Slightly increased WOB, speaking in complete sentences. Wheezing heard best in upper lung fields, present throughout. Slightly decreased air movement. No accessory muscle use.   Abdominal: Soft. Bowel sounds are normal. She exhibits no distension. There is no tenderness.  Musculoskeletal: She exhibits no edema.  Lymphadenopathy:    She has no cervical adenopathy.  Neurological: She is alert.  Skin: Skin is warm and dry.  Psychiatric: She has a normal mood and affect.    ED Course  Procedures (including critical care time)  Labs Reviewed  BASIC METABOLIC PANEL - Abnormal; Notable for the following:    Potassium 3.4 (*)     Glucose, Bld 108 (*)     GFR calc non Af Amer 78 (*)      All other components within normal limits  CBC   Dg Chest 2 View  04/24/2012  *RADIOLOGY REPORT*  Clinical Data: Shortness of breath  CHEST - 2 VIEW  Comparison: 03/08/2012  Findings: Heart size is normal.  No pleural effusion or edema.  No airspace consolidation identified.  Chronic interstitial coarsening is noted bilaterally consistent with COPD.  IMPRESSION:  1.  No active cardiopulmonary abnormalities 2.  COPD.  Original Report Authenticated By: Rosealee Albee, M.D.     Dx 1: Asthma exacerbation.   MDM  Pt with hx only asthma, severe enough to warrant many prior hospital admissions. She feels she came to ED earlier this time than she typically would. Labs and CXR with no remarkable acute findings, aside from very slight hypokalemia. Given dose of solu-medrol in ED, reports some subjective improvement after neb tx with albuterol/atrovent but still has wheezing in all lung fields on exam. She is resistant to hospital admission, so I have discussed with her utilizing our CDU asthma protocol and she agrees. Report is given to CDU midlevel, Drucie Opitz, who will continue to monitor.        Shaaron Adler, [place] 04/24/12 1131

## 2012-04-24 NOTE — ED Notes (Signed)
Patient is resting comfortably. 

## 2012-04-24 NOTE — ED Notes (Signed)
Pt presents with an "asthma attack" intermittent X 1 week. Pt denies relief albuterol inhaler and nebulizer.

## 2012-04-24 NOTE — ED Provider Notes (Signed)
Patient sent to CDU on asthma protocol but Rozell Searing and Preston Fleeting MD. Patient voices understanding of protocol. Will continue to monitor.   Drucie Opitz, [place] 04/24/12 1131  Patient is feeling much improved with decreased wheezing and ongoing good air movement. Respiratory therapy to bedside. Patient feels ready to d/c home soon.   [place], [place] 04/24/12 1345

## 2012-04-24 NOTE — ED Notes (Signed)
RN at the bedside 

## 2012-04-24 NOTE — ED Provider Notes (Signed)
Female comes in with exacerbation of asthma. She has received an albuterol rise to treatment, but on exam she still has considerable wheezing. She has received steroids. She will need additional albuterol treatments.  Dione Booze, MD 04/24/12 1012

## 2012-04-24 NOTE — ED Notes (Signed)
Brought Patient a Happy Meal.

## 2012-04-24 NOTE — Progress Notes (Signed)
Observation review is complete. 

## 2012-04-25 NOTE — ED Provider Notes (Signed)
Medical screening examination/treatment/procedure(s) were conducted as a shared visit with non-physician practitioner(s) and myself.  I personally evaluated the patient during the encounter   Cynia Abruzzo, MD 04/25/12 2149 

## 2012-04-25 NOTE — ED Provider Notes (Signed)
Medical screening examination/treatment/procedure(s) were conducted as a shared visit with non-physician practitioner(s) and myself.  I personally evaluated the patient during the encounter   Dione Booze, MD 04/25/12 2150

## 2012-05-13 ENCOUNTER — Telehealth: Payer: Self-pay | Admitting: Family Medicine

## 2012-05-13 ENCOUNTER — Other Ambulatory Visit: Payer: Self-pay | Admitting: Family Medicine

## 2012-05-13 ENCOUNTER — Ambulatory Visit (INDEPENDENT_AMBULATORY_CARE_PROVIDER_SITE_OTHER): Payer: BC Managed Care – PPO | Admitting: Family Medicine

## 2012-05-13 ENCOUNTER — Encounter: Payer: Self-pay | Admitting: Family Medicine

## 2012-05-13 VITALS — BP 136/84 | HR 84 | Temp 98.1°F | Wt 161.0 lb

## 2012-05-13 DIAGNOSIS — J45901 Unspecified asthma with (acute) exacerbation: Secondary | ICD-10-CM

## 2012-05-13 MED ORDER — FLUTICASONE-SALMETEROL 500-50 MCG/DOSE IN AEPB: 2.0000 | INHALATION_SPRAY | Freq: Two times a day (BID) | RESPIRATORY_TRACT | Status: DC

## 2012-05-13 MED ORDER — PREDNISONE 50 MG PO TABS: ORAL_TABLET | ORAL | Status: DC

## 2012-05-13 MED ORDER — IPRATROPIUM BROMIDE 0.02 % IN SOLN
0.5000 mg | Freq: Once | RESPIRATORY_TRACT | Status: AC
  Administered 2012-05-13: 0.5 mg via RESPIRATORY_TRACT

## 2012-05-13 MED ORDER — FLUTICASONE-SALMETEROL 500-50 MCG/DOSE IN AEPB: 1.0000 | INHALATION_SPRAY | Freq: Two times a day (BID) | RESPIRATORY_TRACT | Status: DC

## 2012-05-13 MED ORDER — METHYLPREDNISOLONE SODIUM SUCC 125 MG IJ SOLR
125.0000 mg | Freq: Once | INTRAMUSCULAR | Status: AC
  Administered 2012-05-13: 125 mg via INTRAMUSCULAR

## 2012-05-13 MED ORDER — ALBUTEROL SULFATE (5 MG/ML) 0.5% IN NEBU: 5.0000 mg | INHALATION_SOLUTION | Freq: Four times a day (QID) | RESPIRATORY_TRACT | Status: DC | PRN

## 2012-05-13 MED ORDER — ALBUTEROL SULFATE (2.5 MG/3ML) 0.083% IN NEBU
2.5000 mg | INHALATION_SOLUTION | Freq: Once | RESPIRATORY_TRACT | Status: AC
  Administered 2012-05-13: 2.5 mg via RESPIRATORY_TRACT

## 2012-05-13 NOTE — Telephone Encounter (Addendum)
Spoke with patient and she is having  problem with asthma and shortnerss of breath . Advised that she will need to be seen and offered work in appointment today. She will call and try to get a ride and call back.

## 2012-05-13 NOTE — Telephone Encounter (Signed)
Need refill on her prednisone.  Having symptoms.

## 2012-05-13 NOTE — Assessment & Plan Note (Signed)
Atrovent nebulizer given in office, with improvement in wheezing, Solumedrol IM 125 mg x1 in office.  Rx for prednisone burst, and refilled advair.  Patient to follow up in 1-2 weeks or PRN if symptoms worsen.

## 2012-05-13 NOTE — Addendum Note (Signed)
Addended by: Ardyth Gal on: 05/13/2012 05:02 PM   Modules accepted: Orders

## 2012-05-13 NOTE — Telephone Encounter (Signed)
Appointment scheduled today. She is currently waiting to be seen .

## 2012-05-13 NOTE — Progress Notes (Signed)
  Subjective:    Patient ID: Lydia Griffin, female    DOB: [birth year], 51 y.o.   MRN: [id number]  HPI  [name] comes in with wheezing and shortness of breath that started today.  She went to a drag race last night and thinks the fumes from the track set this off.  She is out of her advair so has not taken that for a few days.  She took three albuterol nebulizers today, but they only helped a little. She denies fevers/chills.  She says she has to go to work this evening.  Past Medical History  Diagnosis Date  . Asthma     Started as a child; Has been intubated for asthma exacerbation in approx 1990   Family History  Problem Relation Age of Onset  . Asthma Mother   . Heart disease Father 55    MI  . Alcohol abuse Father   . Asthma Sister 9    death from asthma   History  Substance Use Topics  . Smoking status: Never Smoker   . Smokeless tobacco: Never Used  . Alcohol Use: No   Review of Systems See HPI    Objective:   Physical Exam BP 136/84  Pulse 84  Temp 98.1 F (36.7 C) (Oral)  Wt 161 lb (73.029 kg)  SpO2 93% General appearance: alert and mild distress Neck: no JVD, supple, symmetrical, trachea midline and thyroid not enlarged, symmetric, no tenderness/mass/nodules Lungs: Diffuse inspiratory and expiratory wheezes, with long expiratory phase.  Mild increased work of breathing.  Heart: regular rate and rhythm, S1, S2 normal, no murmur, click, rub or gallop Extremities: extremities normal, atraumatic, no cyanosis or edema  Patient was given Atrovent Nebulizer Lungs: Decrease in diffuse inspiratory and expiratory wheezes, increased air movement.        Assessment & Plan:

## 2012-05-13 NOTE — Patient Instructions (Signed)
I'm sorry you are not feeling well.  Please start taking the prednisone tomorrow- the shot should cover you for today.  Also please get the Advair refilled and take two puffs twice a day.  Come back and see [place] in a week or two to make sure you are doing better.

## 2012-05-14 ENCOUNTER — Ambulatory Visit: Payer: No Typology Code available for payment source

## 2012-05-15 ENCOUNTER — Inpatient Hospital Stay (HOSPITAL_COMMUNITY): Payer: BC Managed Care – PPO

## 2012-05-15 ENCOUNTER — Encounter (HOSPITAL_COMMUNITY): Payer: Self-pay | Admitting: *Deleted

## 2012-05-15 ENCOUNTER — Encounter: Payer: Self-pay | Admitting: Family Medicine

## 2012-05-15 ENCOUNTER — Ambulatory Visit (INDEPENDENT_AMBULATORY_CARE_PROVIDER_SITE_OTHER): Payer: No Typology Code available for payment source | Admitting: Family Medicine

## 2012-05-15 ENCOUNTER — Telehealth: Payer: Self-pay | Admitting: *Deleted

## 2012-05-15 ENCOUNTER — Other Ambulatory Visit: Payer: Self-pay

## 2012-05-15 ENCOUNTER — Inpatient Hospital Stay (HOSPITAL_COMMUNITY)
Admission: EM | Admit: 2012-05-15 | Discharge: 2012-05-18 | DRG: 090 | Disposition: A | Discharge status: Home / Self Care | Payer: BC Managed Care – PPO | Attending: Family Medicine | Admitting: Family Medicine

## 2012-05-15 VITALS — BP 149/96 | HR 110 | Temp 98.4°F | Ht 66.0 in | Wt 156.6 lb

## 2012-05-15 DIAGNOSIS — J45901 Unspecified asthma with (acute) exacerbation: Secondary | ICD-10-CM

## 2012-05-15 DIAGNOSIS — J45909 Unspecified asthma, uncomplicated: Secondary | ICD-10-CM

## 2012-05-15 DIAGNOSIS — J189 Pneumonia, unspecified organism: Principal | ICD-10-CM | POA: Diagnosis present

## 2012-05-15 DIAGNOSIS — F3289 Other specified depressive episodes: Secondary | ICD-10-CM | POA: Diagnosis present

## 2012-05-15 DIAGNOSIS — J4550 Severe persistent asthma, uncomplicated: Secondary | ICD-10-CM | POA: Diagnosis present

## 2012-05-15 DIAGNOSIS — F329 Major depressive disorder, single episode, unspecified: Secondary | ICD-10-CM | POA: Diagnosis present

## 2012-05-15 DIAGNOSIS — Z79899 Other long term (current) drug therapy: Secondary | ICD-10-CM

## 2012-05-15 DIAGNOSIS — J159 Unspecified bacterial pneumonia: Secondary | ICD-10-CM

## 2012-05-15 HISTORY — DX: Pneumonia, unspecified organism: J18.9

## 2012-05-15 LAB — CBC WITH DIFFERENTIAL/PLATELET
Basophils Absolute: 0 10*3/uL (ref 0.0–0.1)
Eosinophils Relative: 0 % (ref 0–5)
Lymphocytes Relative: 4 % — ABNORMAL LOW (ref 12–46)
Lymphs Abs: 0.7 10*3/uL (ref 0.7–4.0)
Neutro Abs: 16.1 10*3/uL — ABNORMAL HIGH (ref 1.7–7.7)
Neutrophils Relative %: 90 % — ABNORMAL HIGH (ref 43–77)
Platelets: 275 10*3/uL (ref 150–400)
RBC: 4.64 MIL/uL (ref 3.87–5.11)
RDW: 14.1 % (ref 11.5–15.5)
WBC: 17.8 10*3/uL — ABNORMAL HIGH (ref 4.0–10.5)

## 2012-05-15 LAB — BASIC METABOLIC PANEL
CO2: 21 mEq/L (ref 19–32)
Calcium: 9 mg/dL (ref 8.4–10.5)
Potassium: 3.4 mEq/L — ABNORMAL LOW (ref 3.5–5.1)
Sodium: 139 mEq/L (ref 135–145)

## 2012-05-15 LAB — CARDIAC PANEL(CRET KIN+CKTOT+MB+TROPI)
CK, MB: 5.4 ng/mL — ABNORMAL HIGH (ref 0.3–4.0)
Total CK: 179 U/L — ABNORMAL HIGH (ref 7–177)
Troponin I: <0.3 ng/mL (ref <0.30)

## 2012-05-15 LAB — CBC
HCT: 37.8 % (ref 36.0–46.0)
Hemoglobin: 13 g/dL (ref 12.0–15.0)
MCH: 29 pg (ref 26.0–34.0)
RBC: 4.49 MIL/uL (ref 3.87–5.11)

## 2012-05-15 LAB — MRSA PCR SCREENING: MRSA by PCR: NEGATIVE

## 2012-05-15 LAB — CREATININE, SERUM: Creatinine, Ser: 0.76 mg/dL (ref 0.50–1.10)

## 2012-05-15 MED ORDER — SODIUM CHLORIDE 0.9 % IV SOLN
INTRAVENOUS | Status: DC
  Administered 2012-05-15 – 2012-05-16 (×2): via INTRAVENOUS

## 2012-05-15 MED ORDER — HYDROCODONE-ACETAMINOPHEN 5-325 MG PO TABS
1.0000 | ORAL_TABLET | ORAL | Status: DC | PRN
  Administered 2012-05-15: 1 via ORAL
  Administered 2012-05-16 (×2): 2 via ORAL
  Filled 2012-05-15: qty 2
  Filled 2012-05-15: qty 1
  Filled 2012-05-15: qty 2

## 2012-05-15 MED ORDER — IPRATROPIUM BROMIDE 0.02 % IN SOLN
0.5000 mg | Freq: Once | RESPIRATORY_TRACT | Status: AC
  Administered 2012-05-15: 0.5 mg via RESPIRATORY_TRACT
  Filled 2012-05-15: qty 2.5

## 2012-05-15 MED ORDER — ADULT MULTIVITAMIN W/MINERALS CH
1.0000 | ORAL_TABLET | Freq: Every day | ORAL | Status: DC
  Administered 2012-05-16 – 2012-05-18 (×3): 1 via ORAL
  Filled 2012-05-15 (×3): qty 1

## 2012-05-15 MED ORDER — ALBUTEROL (5 MG/ML) CONTINUOUS INHALATION SOLN
15.0000 mg/h | INHALATION_SOLUTION | RESPIRATORY_TRACT | Status: DC
  Administered 2012-05-15 (×2): 15 mg/h via RESPIRATORY_TRACT

## 2012-05-15 MED ORDER — ALBUTEROL SULFATE (5 MG/ML) 0.5% IN NEBU: 2.5000 mg | INHALATION_SOLUTION | RESPIRATORY_TRACT | Status: DC | PRN

## 2012-05-15 MED ORDER — SODIUM CHLORIDE 0.9 % IJ SOLN: 3.0000 mL | INTRAMUSCULAR | Status: DC | PRN

## 2012-05-15 MED ORDER — MONTELUKAST SODIUM 10 MG PO TABS
10.0000 mg | ORAL_TABLET | Freq: Every day | ORAL | Status: DC
  Administered 2012-05-15 – 2012-05-17 (×3): 10 mg via ORAL
  Filled 2012-05-15 (×4): qty 1

## 2012-05-15 MED ORDER — ENOXAPARIN SODIUM 40 MG/0.4ML ~~LOC~~ SOLN
40.0000 mg | SUBCUTANEOUS | Status: DC
  Administered 2012-05-15 – 2012-05-17 (×3): 40 mg via SUBCUTANEOUS
  Filled 2012-05-15 (×4): qty 0.4

## 2012-05-15 MED ORDER — SODIUM CHLORIDE 0.9 % IV SOLN: 250.0000 mL | INTRAVENOUS | Status: DC | PRN

## 2012-05-15 MED ORDER — METHYLPREDNISOLONE SODIUM SUCC 125 MG IJ SOLR
125.0000 mg | Freq: Once | INTRAMUSCULAR | Status: AC
  Administered 2012-05-15: 125 mg via INTRAVENOUS
  Filled 2012-05-15: qty 2

## 2012-05-15 MED ORDER — IPRATROPIUM BROMIDE 0.02 % IN SOLN
RESPIRATORY_TRACT | Status: AC
  Filled 2012-05-15: qty 2.5

## 2012-05-15 MED ORDER — DEXTROSE 5 % IV SOLN
500.0000 mg | INTRAVENOUS | Status: DC
  Administered 2012-05-15 – 2012-05-16 (×2): 500 mg via INTRAVENOUS
  Filled 2012-05-15 (×3): qty 500

## 2012-05-15 MED ORDER — ONDANSETRON HCL 4 MG PO TABS
4.0000 mg | ORAL_TABLET | Freq: Four times a day (QID) | ORAL | Status: DC | PRN
  Administered 2012-05-15: 4 mg via ORAL
  Filled 2012-05-15: qty 1

## 2012-05-15 MED ORDER — ALBUTEROL SULFATE (5 MG/ML) 0.5% IN NEBU
5.0000 mg | INHALATION_SOLUTION | RESPIRATORY_TRACT | Status: DC
  Administered 2012-05-15: 2.5 mg via RESPIRATORY_TRACT
  Administered 2012-05-15 – 2012-05-16 (×6): 5 mg via RESPIRATORY_TRACT
  Filled 2012-05-15 (×3): qty 0.5
  Filled 2012-05-15: qty 1
  Filled 2012-05-15: qty 0.5
  Filled 2012-05-15: qty 1
  Filled 2012-05-15 (×2): qty 0.5
  Filled 2012-05-15: qty 1

## 2012-05-15 MED ORDER — MORPHINE SULFATE 4 MG/ML IJ SOLN
2.0000 mg | Freq: Once | INTRAMUSCULAR | Status: AC
  Administered 2012-05-15: 2 mg via INTRAVENOUS
  Filled 2012-05-15: qty 1

## 2012-05-15 MED ORDER — HYDROCODONE-ACETAMINOPHEN 5-325 MG PO TABS
1.0000 | ORAL_TABLET | Freq: Once | ORAL | Status: AC
  Administered 2012-05-15: 1 via ORAL
  Filled 2012-05-15: qty 1

## 2012-05-15 MED ORDER — LEVOFLOXACIN IN D5W 750 MG/150ML IV SOLN
750.0000 mg | INTRAVENOUS | Status: DC
  Administered 2012-05-15 – 2012-05-16 (×2): 750 mg via INTRAVENOUS
  Filled 2012-05-15 (×3): qty 150

## 2012-05-15 MED ORDER — LORATADINE 10 MG PO TABS
10.0000 mg | ORAL_TABLET | Freq: Every day | ORAL | Status: DC
  Administered 2012-05-15 – 2012-05-18 (×4): 10 mg via ORAL
  Filled 2012-05-15 (×4): qty 1

## 2012-05-15 MED ORDER — SODIUM CHLORIDE 0.9 % IJ SOLN
3.0000 mL | Freq: Two times a day (BID) | INTRAMUSCULAR | Status: DC
  Administered 2012-05-16 – 2012-05-18 (×5): 3 mL via INTRAVENOUS

## 2012-05-15 MED ORDER — ALBUTEROL SULFATE (5 MG/ML) 0.5% IN NEBU
5.0000 mg | INHALATION_SOLUTION | Freq: Once | RESPIRATORY_TRACT | Status: AC
  Administered 2012-05-15: 5 mg via RESPIRATORY_TRACT
  Filled 2012-05-15: qty 40

## 2012-05-15 MED ORDER — MORPHINE BOLUS VIA INFUSION
2.0000 mg | INTRAVENOUS | Status: DC | PRN
  Filled 2012-05-15: qty 2

## 2012-05-15 MED ORDER — FLUTICASONE-SALMETEROL 500-50 MCG/DOSE IN AEPB
1.0000 | INHALATION_SPRAY | Freq: Two times a day (BID) | RESPIRATORY_TRACT | Status: DC
Start: 2012-05-15 — End: 2012-05-15

## 2012-05-15 MED ORDER — ALBUTEROL (5 MG/ML) CONTINUOUS INHALATION SOLN: 5.0000 mg/h | INHALATION_SOLUTION | RESPIRATORY_TRACT | Status: DC

## 2012-05-15 MED ORDER — ALBUTEROL (5 MG/ML) CONTINUOUS INHALATION SOLN
10.0000 mg/h | INHALATION_SOLUTION | Freq: Once | RESPIRATORY_TRACT | Status: DC
  Filled 2012-05-15: qty 20

## 2012-05-15 MED ORDER — ACETAMINOPHEN 325 MG PO TABS: 650.0000 mg | ORAL_TABLET | Freq: Four times a day (QID) | ORAL | Status: DC | PRN

## 2012-05-15 MED ORDER — SODIUM CHLORIDE 0.9 % IJ SOLN
3.0000 mL | Freq: Two times a day (BID) | INTRAMUSCULAR | Status: DC
  Administered 2012-05-16 – 2012-05-18 (×3): 3 mL via INTRAVENOUS

## 2012-05-15 MED ORDER — ACETAMINOPHEN 650 MG RE SUPP: 650.0000 mg | Freq: Four times a day (QID) | RECTAL | Status: DC | PRN

## 2012-05-15 MED ORDER — MORPHINE SULFATE 2 MG/ML IJ SOLN
2.0000 mg | INTRAMUSCULAR | Status: DC | PRN
  Administered 2012-05-15: 2 mg via INTRAVENOUS
  Filled 2012-05-15: qty 1

## 2012-05-15 MED ORDER — ALBUTEROL SULFATE (2.5 MG/3ML) 0.083% IN NEBU
2.5000 mg | INHALATION_SOLUTION | Freq: Once | RESPIRATORY_TRACT | Status: AC
  Administered 2012-05-15: 2.5 mg via RESPIRATORY_TRACT

## 2012-05-15 MED ORDER — ALBUTEROL SULFATE (5 MG/ML) 0.5% IN NEBU
INHALATION_SOLUTION | RESPIRATORY_TRACT | Status: AC
  Filled 2012-05-15: qty 1

## 2012-05-15 MED ORDER — ONDANSETRON HCL 4 MG/2ML IJ SOLN
4.0000 mg | Freq: Four times a day (QID) | INTRAMUSCULAR | Status: DC | PRN
  Administered 2012-05-17: 4 mg via INTRAVENOUS
  Filled 2012-05-15: qty 2

## 2012-05-15 MED ORDER — PREDNISONE 50 MG PO TABS
50.0000 mg | ORAL_TABLET | Freq: Every day | ORAL | Status: DC
  Administered 2012-05-16 – 2012-05-18 (×3): 50 mg via ORAL
  Filled 2012-05-15 (×4): qty 1

## 2012-05-15 NOTE — Progress Notes (Signed)
  Subjective:    Patient ID: Lydia Griffin, female    DOB: [birth year], 51 y.o.   MRN: [id number]  HPI  Asthma Feeling worse since last visit 2 days ago.  Severe shortness of breath with moving.  Continued wheezing.  Has been using albuterol very frequently and taking advair twice daily and oral prednisone every day.  No fever or sputum or leg swelling  Review of Symptoms - see HPI  PMH - Smoking status noted.     Review of Systems     Objective:   Physical Exam  Alert but with conversational dyspnea Lungs - diffuse wheezing with prolnged expiration Heart - tachy RR  Extrem - no edema  Pulse ox 82% on RA when came into exam room Up to low 90s on 2 left [place] after Nebulizer       Assessment & Plan:

## 2012-05-15 NOTE — Assessment & Plan Note (Signed)
Worsened with hypoxia despite parenteral and oral steroid and frequent bronchodilators.   Send to ER for evaluation. May need ICU admission

## 2012-05-15 NOTE — ED Provider Notes (Signed)
3:48 PM Patient in CDU holding for admission to [facility].  Patient with hx asthma, O2 saturation 82-90% on room air today.   Sign out received from [employer], PA-C, at change of shift. Plan is for lab results, xray results, and second call to [facility] for admission as well as flow manger for bed request.  Has had several breathing treatments, just finished hour long neb.  Showing some improvement in peak flow from 80 to 110 after hour long neb.  Patient's O2 saturation is 91% on room air.  Pt has loud inspiratory and expiratory wheezes on exam but is moving air in all fields.    4:00 PM Family Practice has come to CDU having seen the chest xray and labs resulted.  They will admit the patient.  I have discussed with the resident whether they would like me to start patient on antibiotics while she is in the department and they prefer that I do not, request for me to wait and allow them to discuss which antibiotics they would like to start and they will put in the orders.    Dillard Cannon [place], [place] 05/15/12 [phone]

## 2012-05-15 NOTE — Patient Instructions (Signed)
Patient admitted

## 2012-05-15 NOTE — ED Notes (Signed)
To ED for further eval of sob. Pt with autible wheezing. States she went to her PMD on Monday and given meds. States she is 'tired'. Pt sitting forward in wheelchair for easier breathing. Productive cough. No fevers. Placed on O2 in triage.

## 2012-05-15 NOTE — H&P (Signed)
Family Medicine Teaching [facility] Admission History and Physical Service Pager: [phone]  Patient name: Lydia Griffin Medical record number: [id number] Date of birth: December 31, 1960 Age: 51 y.o. Gender: female  Primary Care Provider: Delbert Harness, MD  Chief Complaint: shortness of breath  Assessment and Plan: Lydia Griffin is a 51 y.o. year old female with PMH significant for persistent asthma and recent admission for same, presenting with SOB and cough worse for the last month, acutely worse today with elevated WBC and possible early pneumonia on CXR. Admitting on 7/24.  1. HCAP - Acute shortness of breath and cough with findings on CXR, WBC >17. Pt admitted in May for similar symptoms, did not require intubation or mechanical ventilation. Mild improvement with breathing treatments, so far, sats in the low 90's on room air at time of admission. No subjective fevers at home, currently afebrile but with temp of 99.8. PLAN - Leoquin for 7 days plus azithromycin for 3 days.   2. Persistent asthma - Pt with recent history of exacerbation and symptoms as above. Mild relief with breathing treatments, sats in the low 90's. PLAN - Continue home Advair and Singulair. Prednisone 50 mg daily. Albuterol nebulizer scheduled and PRN. Incentive spirometry. Pulse-ox with vitals.  FEN/GI: regular diet, NS at 75 mL/h Prophylaxis: Lovenox Disposition: home pending improvement Code Status: Full Code  History of Present Illness: Lydia Griffin is a 51 y.o. year old female presenting with cough and SOB worsening for about 1 month. Pt has a PMH significant for persistent asthma with a recent admission in May 2013 not requiring intubation. Pt describes about four weeks of waxing/waning "head cold" symptoms, cough and congestion with SOB that is particularly worse with exertion, that has been steadily worsening. She was seen two days ago in clinic and was advised to return if her symptoms did not improve; she  came to clinic this morning and had O2 sats in the 80's which improved only to low 90's with a nebulizer treatment and she was then sent to the ED. In the ED, she received a duoneb and continuous albuterol treatment for 1 hour ending approx. 45 minutes prior to this writing, with mild improvement of her symptoms. She also had some right-sided, non-radiating chest pain that was worse with breathing, for which she received morphine.  She states that she feels like she's had to come to the ED more often this summer than in previous years; she reports a long-remote history of admission needing to be intubated, "about 25-30 years ago."  She currently endorses wheezing that has been worse especially today, cough and congestion as above. Denies any fever/chills or N/V currently or recently, though she does have occasional hot-flashes and sweats that she attributes to menopause. Also complains of some back/shoulder pain secondary to her cough as well as poor sleep due to her trouble breathing. She states she has grandchildren that have had "summer colds" for the past couple of months that she believes she has also caught.  Patient Active Problem List  Diagnosis  . Overweight  . DEPRESSION, MAJOR, RECURRENT  . ASTHMA, PERSISTENT  . FATIGUE  . Palpitation  . Asthma exacerbation  . HCAP (healthcare-associated pneumonia)   Past Medical History: Past Medical History  Diagnosis Date  . Asthma     Started as a child; Has been intubated for asthma exacerbation in approx 1990   Past Surgical History: Past Surgical History  Procedure Date  . Tubal ligation   . Hand  flexor tendon repair   Social History: History  Substance Use Topics  . Smoking status: Never Smoker   . Smokeless tobacco: Never Used  . Alcohol Use: No   For any additional social history documentation, please refer to relevant sections of EMR.  Family History: Family History  Problem Relation Age of Onset  . Asthma Mother     . Heart disease Father 102    MI  . Alcohol abuse Father   . Asthma Sister 9    death from asthma   Allergies: No Known Allergies Current Facility-Administered Medications on File Prior to Encounter  Medication Dose Route Frequency Provider Last Rate Last Dose  . albuterol (PROVENTIL) (2.5 MG/3ML) 0.083% nebulizer solution 2.5 mg  2.5 mg Nebulization Once Carney Living, MD   2.5 mg at 05/15/12 1143   Current Outpatient Prescriptions on File Prior to Encounter  Medication Sig Dispense Refill  . albuterol (PROVENTIL,VENTOLIN) 90 MCG/ACT inhaler Inhale 2 puffs into the lungs every 4 (four) hours as needed. For shortness of breath      . Cyanocobalamin (VITAMIN B 12 PO) Place 1 tablet under the tongue daily.      [name] Kitchen loratadine (CLARITIN) 10 MG tablet Take 10 mg by mouth daily.       . montelukast (SINGULAIR) 10 MG tablet Take 1 tablet (10 mg total) by mouth at bedtime.  30 tablet  11  . Multiple Vitamin (MULITIVITAMIN WITH MINERALS) TABS Take 1 tablet by mouth daily.      [name] Kitchen DISCONTD: albuterol (PROVENTIL) (5 MG/ML) 0.5% nebulizer solution Take 1 mL (5 mg total) by nebulization every 6 (six) hours as needed for wheezing or shortness of breath.  30 vial  0  . DISCONTD: Fluticasone-Salmeterol (ADVAIR DISKUS) 500-50 MCG/DOSE AEPB Inhale 1 puff into the lungs 2 (two) times daily.  60 each  3   Review Of Systems: Per HPI   Physical Exam: BP 127/78  Pulse 108  Temp 99.8 F (37.7 C) (Oral)  Resp 22  SpO2 93% Exam: General: lying in bed, uncomfortable but not particularly distressed, alert/oriented and pleasant in conversation HEENT: PERRL, MMM, TMs clear with slight erythema on left Cardiovascular: RRR, no rub/murmur Respiratory: loud wheezes audible without stethoscope, scattered inspiratory and expiratory wheezes in all lung fields with protracted expiratory phase, mild supraclavicular retractions Abdomen: soft, non-tender, non-distended, BS+ Extremities: moves all extremities  equally/spontaneously, distal pulses 2+ and intact Skin: no obvious rash, warm/dry/intact Neuro: strength 5/5 in all extremities, no obvious focal deficits  Labs and Imaging:  Lab 05/15/12 1420  WBC 17.8*  HGB 13.3  HCT 38.9  PLT 275    Lab 05/15/12 1420  NA 139  K 3.4*  CL 106  CO2 21  BUN 12  CREATININE 0.71  LABGLOM --  GLUCOSE 88  CALCIUM 9.0   CXR, 7/24 Findings: Slight increased markings right infrahilar region. This  may represent confluence of shadows although subtle infiltrate not  excluded.  Central pulmonary vessel prominence stable without pulmonary edema.  Probable confluence of shadows left upper lung zone without  abnormality seen on recent chest x-ray.  Heart size within normal limits.  IMPRESSION:  Slight increased markings right infrahilar region. This may  represent confluence of shadows although subtle infiltrate not  excluded.  Street, [place], MD 05/15/2012, 4:48 PM  PGY-2 Addendum  Brief HPI: Ms. Kastens is a 51 year old woman with history of severe persistent asthma who presents with asthma exacerbation.  She was initially seen in clinic 2  days ago, at that time she was given solumedrol x1 and Atrovent neb with good improvement.  On the day of admission, she was again evaluated in clinic and sent to the ED for further management after found to be hypoxic and had poor response to nebulizer treatment.  Patient reports having cold like symptoms on and off for the last month, with worsened breathing over the last several days.  Worse with movement.  Associated with cough and wheezing.  Denies fevers, chills, sweats.   Reports asthma had been well controlled up until a few months ago.  Recent hospitalization 2 months ago for an exacerbation.  Normal triggers are rain and viral infection.  History of intubation 10 years ago.   Please see excellent intern note for complete history.  Physical Exam Gen: alert, cooperative, short of breath with  speaking HEENT: AT/[place], sclera white, PERRL, EOMI, MMM, TMs mild erythematous bilaterally Neck: supple, no LAD CV: mild tachycardia, regular rhythm, no murmurs Pulm: mild to moderate respiratory distress, some abdominal breathing, prolonged expiratory phase, diffuse inspiratory and expiratory wheezes Abd: +BS, soft, NTND Ext: 2+ DP pulses, no edema Neuro: PERRL, 5/5 strength in all extremities Skin: no rashes or lesions  Labs: see intern note  Assessment and Plan 51 yo F with history of severe persistent asthma presenting with asthma exacerbation and health-care associated pneumonia.  # HCAP: Given prolonged illness, likely infiltrate on CXR, and elevated white count will treat with antibiotic therapy. - Levaquin 750mg  IV q24hrs - Azithromycin 500mg  IV q24hrs x3 days  # Asthma exacerbation: Likely trigger viral vs bacterial pneumonia. - Continue home advair, singulair, loratadine - Albuterol neb q2hr scheduled, q1hr prn - May need additional CAT  - Continue prednisone 50mg  daily - IS - O2 as needed to keep sats >90%  # Depression: Stable.    FEN/GI: NS 75cc/hr, regular diet PPx: SQ lovenox Dispo: admit to step down  BOOTH, Sherley Mckenney 05/15/2012, 1700

## 2012-05-15 NOTE — ED Provider Notes (Signed)
Medical screening examination/treatment/procedure(s) were conducted as a shared visit with non-physician practitioner(s) and myself.  I personally evaluated the patient during the encounter  History of severe asthma, not improved with multiple nebs. CAT started, anticipate admission.   Charles B. Bernette Mayers, MD 05/15/12 1452

## 2012-05-15 NOTE — Progress Notes (Signed)
Patient came in to office with O2 sat--82% and increased to 90% on room air.  Patient started on 2L O2 via [place] and given Albuterol 2.5mg /26mL nebulizer treatment.  Post Albuterol---O2 sat---91% on 2L .  Patient transported to ED with RN Senaida Ores, Maryjean Ka, RN) via [employer].  Discharged patient to Charge RN in ED.  Gaylene Brooks, RN

## 2012-05-15 NOTE — ED Provider Notes (Signed)
History     CSN: [id number]  Arrival date & time 05/15/12  1127   First MD Initiated Contact with Patient 05/15/12 1204      Chief Complaint  Patient presents with  . Shortness of Breath    (Consider location/radiation/quality/duration/timing/severity/associated sxs/prior treatment) Patient is a 51 y.o. female presenting with shortness of breath. The history is provided by the patient.  Shortness of Breath  The current episode started 3 to 5 days ago. The problem has been gradually worsening. The problem is severe. Associated symptoms include shortness of breath and wheezing. Pertinent negatives include no fever. Associated symptoms comments: Increased wheezing for the past 3 days responding less to Albuterol nebulizer treatments. She was seen by her doctor 2 days ago and started on prednisone 50 mg but continues to worsen. She was seen again in the office today and, per notes from the [facility], arrived with oxygenation of 82%..    Past Medical History  Diagnosis Date  . Asthma     Started as a child; Has been intubated for asthma exacerbation in approx 1990    Past Surgical History  Procedure Date  . Tubal ligation   . Hand     flexor tendon repair    Family History  Problem Relation Age of Onset  . Asthma Mother   . Heart disease Father 74    MI  . Alcohol abuse Father   . Asthma Sister 9    death from asthma    History  Substance Use Topics  . Smoking status: Never Smoker   . Smokeless tobacco: Never Used  . Alcohol Use: No    OB History    Grav Para Term Preterm Abortions TAB SAB Ect Mult Living                  Review of Systems  Constitutional: Negative for fever.  Respiratory: Positive for chest tightness, shortness of breath and wheezing.   Gastrointestinal: Negative for nausea and vomiting.    Allergies  Review of patient's allergies indicates no known allergies.  Home Medications   Current Outpatient Rx  Name Route Sig  Dispense Refill  . ALBUTEROL SULFATE (5 MG/ML) 0.5% IN NEBU Nebulization Take 5 mg by nebulization every 6 (six) hours as needed. wheezing    . ALBUTEROL 90 MCG/ACT IN AERS Inhalation Inhale 2 puffs into the lungs every 4 (four) hours as needed. For shortness of breath    . VITAMIN B 12 PO Sublingual Place 1 tablet under the tongue daily.    [name] Kitchen FLUTICASONE-SALMETEROL 500-50 MCG/DOSE IN AEPB Inhalation Inhale 1 puff into the lungs 2 (two) times daily.    [name] Kitchen LORATADINE 10 MG PO TABS Oral Take 10 mg by mouth daily.     [name] Kitchen MONTELUKAST SODIUM 10 MG PO TABS Oral Take 1 tablet (10 mg total) by mouth at bedtime. 30 tablet 11  . ADULT MULTIVITAMIN W/MINERALS CH Oral Take 1 tablet by mouth daily.    [name] Kitchen PREDNISONE 50 MG PO TABS  One tablet by mouth daily for 5 days.      BP 139/60  Pulse 108  Temp 98.7 F (37.1 C)  Resp 24  SpO2 100%  Physical Exam  Constitutional: She is oriented to person, place, and time. She appears well-developed and well-nourished.  Cardiovascular: Regular rhythm.  Tachycardia present.   No murmur heard. Pulmonary/Chest:       Significant audible wheezing, patient in mild respiratory distress on 2L O2.   Musculoskeletal:  She exhibits no edema.  Neurological: She is alert and oriented to person, place, and time.  Skin: Skin is warm and dry.  Psychiatric: She has a normal mood and affect.    ED Course  Procedures (including critical care time)  Labs Reviewed - No data to display No results found.   No diagnosis found.  1. Asthma exacerbation   MDM  Patient is no better with multiple nebulizers. Discussed with [facility] resident, Dr. Casper Harrison, for admission.    14:15:  [facility] Resident requesting results of CXR and bloodwork prior to admission. Bed request put on hold by flow nurse - admission orders left in place. When results are available, [facility] will be consulted and flow manager can be made aware of bed request status to move forward with admission.    Rodena Medin,  PA-C 05/15/12 1403  Rodena Medin, PA-C 05/15/12 1444

## 2012-05-15 NOTE — Telephone Encounter (Signed)
Patient calls stating her  she continues having problems with breathing, low peak flow . Advised to come to office now. She will be here in 30 minutes.

## 2012-05-15 NOTE — ED Notes (Addendum)
Report called to [phone]. Patient prepared for transfer.

## 2012-05-16 DIAGNOSIS — R0602 Shortness of breath: Secondary | ICD-10-CM

## 2012-05-16 LAB — BASIC METABOLIC PANEL
GFR calc Af Amer: >90 mL/min (ref >90)
GFR calc non Af Amer: >90 mL/min (ref >90)
Glucose, Bld: 115 mg/dL — ABNORMAL HIGH (ref 70–99)
Potassium: 3.8 mEq/L (ref 3.5–5.1)
Sodium: 138 mEq/L (ref 135–145)

## 2012-05-16 LAB — CBC
Hemoglobin: 12.6 g/dL (ref 12.0–15.0)
RBC: 4.39 MIL/uL (ref 3.87–5.11)

## 2012-05-16 MED ORDER — ALBUTEROL SULFATE (5 MG/ML) 0.5% IN NEBU
5.0000 mg | INHALATION_SOLUTION | RESPIRATORY_TRACT | Status: DC
  Administered 2012-05-16 – 2012-05-18 (×11): 5 mg via RESPIRATORY_TRACT
  Filled 2012-05-16: qty 1
  Filled 2012-05-16 (×6): qty 0.5
  Filled 2012-05-16 (×4): qty 1
  Filled 2012-05-16 (×2): qty 0.5

## 2012-05-16 MED ORDER — FLUTICASONE-SALMETEROL 500-50 MCG/DOSE IN AEPB
1.0000 | INHALATION_SPRAY | Freq: Two times a day (BID) | RESPIRATORY_TRACT | Status: DC
  Administered 2012-05-16 – 2012-05-18 (×4): 1 via RESPIRATORY_TRACT
  Filled 2012-05-16 (×2): qty 14

## 2012-05-16 NOTE — Discharge Summary (Signed)
Physician Discharge Summary  Patient ID: Lydia Griffin MRN: [id number] DOB: 1961/10/11 Age: 51 y.o.  Admit date: 05/15/2012 Discharge date: 05/18/2012 Admitting Physician: Nestor Ramp, MD  PCP: Delbert Harness, MD  Consultants:None    Discharge Diagnosis: Principal Problem:  *HCAP (healthcare-associated pneumonia) Active Problems:  ASTHMA, PERSISTENT  Asthma exacerbation    Hospital Course Pt is a 51 y.o. patient with significant PMHx for asthma, depression who presented to the ED with worsening SOB and productive cough for the last month admitted for HCAP.    1.HCAP - Pt presented with hypoxia, productive cough x 1 month, and worsening SOB   1) ED got CXR that showed infiltrate in the R infrahilar region. With white count of 17 and recent hospitalization there was concern for HCAP  2) Started on Levaquin (Total of seven days) and Azithromycin (Total of three days) for coverage of atypical  3) Pt did not develop worsening fever, white count trended down and felt much improved before discharge  2. Dyspnea/Acute Respiratory Distress vs. Asthma exacerbation   1) Pt has a PMHx signifcant for asthma (Severe persistent) tx with PRN albuterol, Singulair, Advair   2) Pt continued home advair, Singulair, and was started on Prednisone 50 mg qd, albuterol PRN and scheduled nebs. Was given prednisone 50 mg (3 days prior to admission and will treat for 10 days)  3) O2 saturations were in low 90s upon admission and pt was subsequently started on O2 2L [place] and sat stayed > 96.  O2 was d/c and pt did well on RA.  4) D Dimer was ordered since pt had pleurtic chest pain on deep inspitration and was found to be 0.66 (+). Wells score was 0. Since D-Dimer was +, LE duplex B/L were ordered and did not show DVT B/L.  5) Cardiac origin worked up as well and EKG was NSR, cardiac markers - x 1.  6) Pt set up with pulmonology consult as outpt for more aggressive tx and possible allergy testing in future.      Procedures/Imaging:  Dg Chest Portable 1 View 05/15/2012   IMPRESSION: Slight increased markings right infrahilar region.  This may represent confluence of shadows although subtle infiltrate not excluded.  Original Report Authenticated By: Fuller Canada, M.D.   Lower Extremity Duplex Scan 05/15/2012 No evidence of DVT or SVT on B/L lower extremities  Labs  CBC  Lab 05/18/12 0540 05/17/12 0635 05/16/12 0404  WBC 9.7 9.8 18.0*  HGB 12.7 12.4 12.6  HCT 37.1 37.1 37.0  PLT 283 277 274   BMET  Lab 05/16/12 0404 05/15/12 1912 05/15/12 1420  NA 138 -- 139  K 3.8 -- 3.4*  CL 103 -- 106  CO2 25 -- 21  BUN 10 -- 12  CREATININE 0.73 0.76 0.71  CALCIUM 9.2 -- 9.0  PROT -- -- --  BILITOT -- -- --  ALKPHOS -- -- --  ALT -- -- --  AST -- -- --  GLUCOSE 115* -- 88   CARDIAC PANEL(CRET KIN+CKTOT+MB+TROPI)     Status: Abnormal   Collection Time   05/15/12  8:20 PM      Component Value Range Comment   Total CK 179 (*) 7 - 177 U/L    CK, MB 5.4 (*) 0.3 - 4.0 ng/mL    Troponin I <0.30  <0.30 ng/mL    Relative Index 3.0 (*) 0.0 - 2.5   D-DIMER, QUANTITATIVE     Status: Abnormal   Collection Time  05/16/12  4:04 AM      Component Value Range Comment   D-Dimer, Quant 0.66 (*) 0.00 - 0.48 ug/mL-FEU        Patient condition at time of discharge/disposition: stable  Disposition-home   Follow up issues: 1. Please follow up on pt with her asthma.  Please monitor her status to make sure she is taking her antibiotics and asthma medications.    2. Please make sure patient has an appointment with pulmonology for outpatient follow up regarding her asthma.  She may need more aggressive treatment in the future to prevent exacerbations requiring hospitalization.   Discharge follow up:  Follow-up Information    Follow up with [facility]. (A referral was placed to the pulmonlogy department.  They should be in touch with you to make an appointment.  If you do not hear from them within  the next week, please cal the number listed to schedule an appointment.)    Contact information:   [address] [place] [address] [phone]      Follow up with Delbert Harness, MD. (Wednesday July 31@1 :30 PM)    Contact information:   [address] [place] [address] [phone]          Discharge Instructions: Please refer to Patient Instructions section of EMR for full details.  Patient was counseled important signs and symptoms that should prompt return to medical care, changes in medications, dietary instructions, activity restrictions, and follow up appointments.  Significant instructions noted below:  Discharge Orders    Future Appointments: Provider: Department: Dept Phone: Center:   05/22/2012 52:30 PM Macy Mis, MD [facility] [phone] [facility]     Future Orders Please Complete By Expires   Ambulatory referral to Pulmonology      Comments:   Please evaluate pt for her severe persistent asthma.  She has never seen a pulmonologist before.  Pt is on albuterol 500/50 as well as singulair and may need allergy testing in the future.  May be a good candidate for Xolair if positive.     Discharge Orders    Future Appointments: Provider: Department: Dept Phone: Center:   05/22/2012 52:30 PM Macy Mis, MD [facility] Res[facility] [phone] [facility]     Future Orders Please Complete By Expires   Ambulatory referral to Pulmonology      Comments:   Please evaluate pt for her severe persistent asthma.  She has never seen a pulmonologist before.  Pt is on albuterol 500/50 as well as singulair and may need allergy testing in the future.  May be a good candidate for Xolair if positive.        Discharge Medications Medication List  As of 05/18/2012  8:34 AM   START taking these medications         levofloxacin 750 MG tablet   Commonly known as: LEVAQUIN   Take 1 tablet (750 mg total) by mouth daily.         CHANGE how you take these  medications         albuterol 90 MCG/ACT inhaler   Commonly known as: PROVENTIL,VENTOLIN   Inhale 2 puffs into the lungs every 4 (four) hours as needed for wheezing or shortness of breath. For shortness of breath   What changed: reasons to take the med         CONTINUE taking these medications         albuterol (5 MG/ML) 0.5% nebulizer solution  Commonly known as: PROVENTIL      Fluticasone-Salmeterol 500-50 MCG/DOSE Aepb   Commonly known as: ADVAIR   Inhale 1 puff into the lungs 2 (two) times daily.      loratadine 10 MG tablet   Commonly known as: CLARITIN      montelukast 10 MG tablet   Commonly known as: SINGULAIR   Take 1 tablet (10 mg total) by mouth at bedtime.      multivitamin with minerals Tabs      predniSONE 50 MG tablet   Commonly known as: DELTASONE      VITAMIN B 12 PO          Where to get your medications    These are the prescriptions that you need to pick up. We sent them to a specific pharmacy, so you will need to go there to get them.   [facility] - [place], [place] - [address]    [address] [place] [address]    Phone: [phone]        albuterol 90 MCG/ACT inhaler   Fluticasone-Salmeterol 500-50 MCG/DOSE Aepb   levofloxacin 750 MG tablet           Majel Homer, MD of [facility] 05/18/2012 8:34 AM

## 2012-05-16 NOTE — Progress Notes (Signed)
VASCULAR LAB PRELIMINARY  PRELIMINARY  PRELIMINARY  PRELIMINARY  Bilateral lower extremity venous Dopplers completed.    Preliminary report:  No DVT or SVT noted in the bilateral lower extremities.  Lydia Griffin, 05/16/2012, 4:43 PM

## 2012-05-16 NOTE — Progress Notes (Signed)
FMTS Attending Daily Note: Lydia Bibb MD [phone] pager office [phone] I  have seen and examined this patient, reviewed their chart. I have discussed this patient with the resident. I agree with the resident's findings, assessment and care plan. 

## 2012-05-16 NOTE — Progress Notes (Signed)
Patient taken off oxygen with no c/o SOB or O2 stats dropping. Patient instructed to call if she experiences any SOB or anymore discomfort.  Will contiune to monitor.

## 2012-05-16 NOTE — Progress Notes (Signed)
Utilization review completed.  

## 2012-05-16 NOTE — Progress Notes (Signed)
PGY-1 Daily Progress Note Family Medicine Teaching Service Sobieski R. Ji Fairburn, DO Service Pager: [phone]   Subjective: Pt is doing much better than last night.  She is still having some pain with deep inspiration but improved as well.  Better SOB as well on RA.  No calf pain or lower leg edema at this time or dull chest pain, radiating chest pain.  Objective:  VITALS Temp:  [97.6 F (36.4 C)-99.8 F (37.7 C)] 97.8 F (36.6 C) (07/25 1220) Pulse Rate:  [71-114] 71  (07/25 0800) Resp:  [14-32] 24  (07/25 0800) BP: (105-129)/(59-78) 115/63 mmHg (07/25 0800) SpO2:  [91 %-100 %] 97 % (07/25 1226) FiO2 (%):  [4 %-24 %] 24 % (07/24 1906) Weight:  [164 lb 3.9 oz (74.5 kg)] 164 lb 3.9 oz (74.5 kg) (07/24 1905)  In/Out  Intake/Output Summary (Last 24 hours) at 05/16/12 1328 Last data filed at 05/16/12 1100  Gross per 24 hour  Intake    965 ml  Output      0 ml  Net    965 ml    Physical Exam: Gen:  NAD HEENT: moist mucous membranes CV: Regular rate and rhythm, no murmurs rubs or gallops PULM: Decreased BS B/L diffusely, + inspiratory wheezing and expiratory course wheezing diffusely.  No increased respiratory effort and no TTP of chest. ABD: soft/nontender/nondistended/normal bowel sounds EXT: No edema, no cyanosis, Pulses + 2 B/L LE. - Homan Sign B/L Neuro: Alert and oriented x3  MEDS Scheduled Meds:    . albuterol  5 mg Nebulization Q4H  . albuterol      . azithromycin  500 mg Intravenous Q24H  . enoxaparin (LOVENOX) injection  40 mg Subcutaneous Q24H  . Fluticasone-Salmeterol  1 puff Inhalation BID  . HYDROcodone-acetaminophen  1 tablet Oral Once  . ipratropium      . levofloxacin (LEVAQUIN) IV  750 mg Intravenous Q24H  . loratadine  10 mg Oral Daily  . montelukast  10 mg Oral QHS  .  morphine injection  2 mg Intravenous Once  . multivitamin with minerals  1 tablet Oral Daily  . predniSONE  50 mg Oral Q breakfast  . sodium chloride  3 mL Intravenous Q12H  . sodium  chloride  3 mL Intravenous Q12H  . DISCONTD: albuterol  5 mg Nebulization Q2H  . DISCONTD: albuterol  10 mg/hr Nebulization Once  . DISCONTD: Fluticasone-Salmeterol  1 puff Inhalation BID   Continuous Infusions:   . sodium chloride 75 mL/hr at 05/16/12 0300  . DISCONTD: albuterol 15 mg/hr (05/15/12 1650)  . DISCONTD: albuterol     PRN Meds:.sodium chloride, acetaminophen, acetaminophen, albuterol, HYDROcodone-acetaminophen, ondansetron (ZOFRAN) IV, ondansetron, sodium chloride, DISCONTD:  morphine injection, DISCONTD: morphine  Labs and imaging:   CBC  Lab 05/16/12 0404 05/15/12 1912 05/15/12 1420  WBC 18.0* 19.3* 17.8*  HGB 12.6 13.0 13.3  HCT 37.0 37.8 38.9  PLT 274 260 275   BMET/CMET  Lab 05/16/12 0404 05/15/12 1912 05/15/12 1420  NA 138 -- 139  K 3.8 -- 3.4*  CL 103 -- 106  CO2 25 -- 21  BUN 10 -- 12  CREATININE 0.73 0.76 0.71  CALCIUM 9.2 -- 9.0  PROT -- -- --  BILITOT -- -- --  ALKPHOS -- -- --  ALT -- -- --  AST -- -- --  GLUCOSE 115* -- 88   Results for orders placed during the hospital encounter of 05/15/12 (from the past 24 hour(s))  CBC WITH DIFFERENTIAL  Status: Abnormal   Collection Time   05/15/12  2:20 PM      Component Value Range   WBC 17.8 (*) 4.0 - 10.5 K/uL   RBC 4.64  3.87 - 5.11 MIL/uL   Hemoglobin 13.3  12.0 - 15.0 g/dL   HCT 16.1  09.6 - 04.5 %   MCV 83.8  78.0 - 100.0 fL   MCH 28.7  26.0 - 34.0 pg   MCHC 34.2  30.0 - 36.0 g/dL   RDW 40.9  81.1 - 91.4 %   Platelets 275  150 - 400 K/uL   Neutrophils Relative 90 (*) 43 - 77 %   Neutro Abs 16.1 (*) 1.7 - 7.7 K/uL   Lymphocytes Relative 4 (*) 12 - 46 %   Lymphs Abs 0.7  0.7 - 4.0 K/uL   Monocytes Relative 6  3 - 12 %   Monocytes Absolute 1.0  0.1 - 1.0 K/uL   Eosinophils Relative 0  0 - 5 %   Eosinophils Absolute 0.0  0.0 - 0.7 K/uL   Basophils Relative 0  0 - 1 %   Basophils Absolute 0.0  0.0 - 0.1 K/uL  BASIC METABOLIC PANEL     Status: Abnormal   Collection Time   05/15/12   2:20 PM      Component Value Range   Sodium 139  135 - 145 mEq/L   Potassium 3.4 (*) 3.5 - 5.1 mEq/L   Chloride 106  96 - 112 mEq/L   CO2 21  19 - 32 mEq/L   Glucose, Bld 88  70 - 99 mg/dL   BUN 12  6 - 23 mg/dL   Creatinine, Ser 7.82  0.50 - 1.10 mg/dL   Calcium 9.0  8.4 - 95.6 mg/dL   GFR calc non Af Amer >90  >90 mL/min   GFR calc Af Amer >90  >90 mL/min  MRSA PCR SCREENING     Status: Normal   Collection Time   05/15/12  7:06 PM      Component Value Range   MRSA by PCR NEGATIVE  NEGATIVE  CBC     Status: Abnormal   Collection Time   05/15/12  7:12 PM      Component Value Range   WBC 19.3 (*) 4.0 - 10.5 K/uL   RBC 4.49  3.87 - 5.11 MIL/uL   Hemoglobin 13.0  12.0 - 15.0 g/dL   HCT 21.3  08.6 - 57.8 %   MCV 84.2  78.0 - 100.0 fL   MCH 29.0  26.0 - 34.0 pg   MCHC 34.4  30.0 - 36.0 g/dL   RDW 46.9  62.9 - 52.8 %   Platelets 260  150 - 400 K/uL  CREATININE, SERUM     Status: Normal   Collection Time   05/15/12  7:12 PM      Component Value Range   Creatinine, Ser 0.76  0.50 - 1.10 mg/dL   GFR calc non Af Amer >90  >90 mL/min   GFR calc Af Amer >90  >90 mL/min  CARDIAC PANEL(CRET KIN+CKTOT+MB+TROPI)     Status: Abnormal   Collection Time   05/15/12  8:20 PM      Component Value Range   Total CK 179 (*) 7 - 177 U/L   CK, MB 5.4 (*) 0.3 - 4.0 ng/mL   Troponin I <0.30  <0.30 ng/mL   Relative Index 3.0 (*) 0.0 - 2.5  BASIC METABOLIC PANEL     Status:  Abnormal   Collection Time   05/16/12  4:04 AM      Component Value Range   Sodium 138  135 - 145 mEq/L   Potassium 3.8  3.5 - 5.1 mEq/L   Chloride 103  96 - 112 mEq/L   CO2 25  19 - 32 mEq/L   Glucose, Bld 115 (*) 70 - 99 mg/dL   BUN 10  6 - 23 mg/dL   Creatinine, Ser 2.13  0.50 - 1.10 mg/dL   Calcium 9.2  8.4 - 08.6 mg/dL   GFR calc non Af Amer >90  >90 mL/min   GFR calc Af Amer >90  >90 mL/min  CBC     Status: Abnormal   Collection Time   05/16/12  4:04 AM      Component Value Range   WBC 18.0 (*) 4.0 - 10.5 K/uL    RBC 4.39  3.87 - 5.11 MIL/uL   Hemoglobin 12.6  12.0 - 15.0 g/dL   HCT 57.8  46.9 - 62.9 %   MCV 84.3  78.0 - 100.0 fL   MCH 28.7  26.0 - 34.0 pg   MCHC 34.1  30.0 - 36.0 g/dL   RDW 52.8  41.3 - 24.4 %   Platelets 274  150 - 400 K/uL  D-DIMER, QUANTITATIVE     Status: Abnormal   Collection Time   05/16/12  4:04 AM      Component Value Range   D-Dimer, Quant 0.66 (*) 0.00 - 0.48 ug/mL-FEU   Dg Chest Portable 1 View  05/15/2012   IMPRESSION: Slight increased markings right infrahilar region.  This may represent confluence of shadows although subtle infiltrate not excluded.  Original Report Authenticated By: Fuller Canada, M.D.    EKG 05/15/12 Normal sinus rhythm Normal ECG    Assessment  Pt is a 51 y.o. patient with significant PMHx for asthma, depression who presented to the ED with worsening SOB and productive cough for the last month admitted for HCAP.   Plan:  1.HCAP - Pt presented with hypoxia, productive cough x 1 month, and worsening SOB  1) ED got CXR that showed infiltrate in the R infrahilar region, CBCAD of 17 w/ L shift concerning for HCA pneumonia and the fact that she was int he hospital <90 days ago.  2) Started on Levaquin and Azithromycin for coverage of atypical.  3) If clinically gets worse, WBC elevates, becomes febrile, becomes septic, will start on Zosyn/Vanc for broader coverage.  2. Dyspnea/Acute Respiratory Distress vs. Asthma exacerbation   1) Pt has a PMHx signifcant for asthma (Severe persistent) tx with PRN albuterol, Singulair, Advair  2) Continuing home advair, Singulair, and started on Prednisone 50 mg qd, albuterol PRN and scheduled per neb.  Was given one dose of Atrovent neb in ED as well.  Will continue Prednisone 50 mg qd for total of 10 days (6 more days)  3) On maximum outpt advair dose.  Will set up outpt Pulmonary appt, for further assessment of asthma management as had exacerbations in the past couple months.  May be allergy related and need  allergy testing as well.  She could be a good candidate for Xolair.  4) O2 saturations were in low 90s upon admission and pt was subsequently started on O2 2L [place] and sat have been >96 since then.  5) D Dimer was ordered since pt had pleurtic chest pain on deep inspitration and was found to be 0.66 (+).  Will consider workup for  PE. Wells score is (0 for PE being most likely dx, 0 for clinical S/Sx of DVT, Tachycardia = 0, Prior DVT/PE = 0, Malignancy = 0, Hemoptysis = 0, and Immobilization = 0)  For a total score of 0.  Will continue to monitor and consider CTA if reassess pt with elevated Wells Score.  6) Will discontinue her O2 to monitor her saturations.  If decreases, will restart O 2 at 2 L [place].  Also, will get LE Duplex B/L due to + D - Dimer.  If -, will not pursue DVT/PE and most likely asthma exacerbation.  7) Cardiac origin worked up as well and currently EKG is NSR, cardiac markers - x 1.  Will continue to monitor and will get repeat EKG/Cardiac markers if has CP/pressure.  FEN/GI: regular diet, NS at 75 mL/h  Prophylaxis: Lovenox  Disposition: home pending improvement  Code Status: Full Code  Melroy Bougher R. Jorene Kaylor DO, FMTS PGY-1

## 2012-05-16 NOTE — H&P (Signed)
FMTS Attending Admission Note: Denny Levy MD [phone] pager office [phone] I  have seen and examined this patient in the ED on the date of admission. I have reviewed ther chart. I have discussed this patient with the resident. I agree with the resident's findings, assessment and care plan.

## 2012-05-16 NOTE — Progress Notes (Signed)
Patient transferred to [facility] room 9C.  Report called to RN on 6N and all questions answered. Patient's VVS, and transferred via wheelchair with RN's (family aware of new room assignment.

## 2012-05-17 LAB — CBC
MCH: 28.2 pg (ref 26.0–34.0)
MCHC: 33.4 g/dL (ref 30.0–36.0)
MCV: 84.3 fL (ref 78.0–100.0)
Platelets: 277 10*3/uL (ref 150–400)
RDW: 14 % (ref 11.5–15.5)

## 2012-05-17 MED ORDER — LEVOFLOXACIN 750 MG PO TABS
750.0000 mg | ORAL_TABLET | ORAL | Status: DC
  Administered 2012-05-17: 750 mg via ORAL
  Filled 2012-05-17 (×2): qty 1

## 2012-05-17 MED ORDER — AZITHROMYCIN 500 MG PO TABS
500.0000 mg | ORAL_TABLET | ORAL | Status: DC
Start: 2012-05-17 — End: 2012-05-18
  Administered 2012-05-17: 500 mg via ORAL
  Filled 2012-05-17 (×2): qty 1

## 2012-05-17 MED ORDER — POLYETHYLENE GLYCOL 3350 17 G PO PACK
17.0000 g | PACK | Freq: Every day | ORAL | Status: DC | PRN
  Filled 2012-05-17: qty 1

## 2012-05-17 MED ORDER — DOCUSATE SODIUM 100 MG PO CAPS: 100.0000 mg | ORAL_CAPSULE | Freq: Two times a day (BID) | ORAL | Status: DC | PRN

## 2012-05-17 NOTE — Progress Notes (Signed)
PGY-1 Daily Progress Note Family Medicine Teaching Service Lydia R. Analiza Cowger, DO Service Pager: [phone]   Subjective: Pt is doing much better since admission.  Still have some wheezing but able to ambulate as well.  No CP, fevers, chills, or sweats.  She is feeling much better today than previous and was transferred from SDU yesterday.   Objective:  VITALS Temp:  [97.6 F (36.4 C)-98.5 F (36.9 C)] 98.5 F (36.9 C) (07/26 0617) Pulse Rate:  [78-100] 85  (07/26 0617) Resp:  [20-26] 20  (07/26 0617) BP: (115-143)/(64-84) 124/71 mmHg (07/26 0617) SpO2:  [92 %-100 %] 92 % (07/26 1147) FiO2 (%):  [21 %] 21 % (07/26 1147)  In/Out  Intake/Output Summary (Last 24 hours) at 05/17/12 1224 Last data filed at 05/17/12 0940  Gross per 24 hour  Intake    746 ml  Output      0 ml  Net    746 ml    Physical Exam: Gen:  NAD HEENT: moist mucous membranes CV: Regular rate and rhythm, no murmurs rubs or gallops PULM: Decreased BS B/L diffusely, + inspiratory wheezing and expiratory course wheezing diffusely, BUT IMPROVED.  No increased respiratory effort and no TTP of chest. ABD: soft/nontender/nondistended/normal bowel sounds EXT: No edema, no cyanosis, Pulses + 2 B/L LE. Neuro: Alert and oriented x3  MEDS Scheduled Meds:    . albuterol  5 mg Nebulization Q4H  . azithromycin  500 mg Oral Q24H  . enoxaparin (LOVENOX) injection  40 mg Subcutaneous Q24H  . Fluticasone-Salmeterol  1 puff Inhalation BID  . levofloxacin  750 mg Oral Q24H  . loratadine  10 mg Oral Daily  . montelukast  10 mg Oral QHS  . multivitamin with minerals  1 tablet Oral Daily  . predniSONE  50 mg Oral Q breakfast  . sodium chloride  3 mL Intravenous Q12H  . sodium chloride  3 mL Intravenous Q12H  . DISCONTD: azithromycin  500 mg Intravenous Q24H  . DISCONTD: levofloxacin (LEVAQUIN) IV  750 mg Intravenous Q24H   Continuous Infusions:    . DISCONTD: sodium chloride 75 mL/hr at 05/16/12 0957   PRN Meds:.sodium  chloride, acetaminophen, acetaminophen, albuterol, HYDROcodone-acetaminophen, ondansetron (ZOFRAN) IV, ondansetron, sodium chloride  Labs and imaging:   CBC  Lab 05/17/12 0635 05/16/12 0404 05/15/12 1912  WBC 9.8 18.0* 19.3*  HGB 12.4 12.6 13.0  HCT 37.1 37.0 37.8  PLT 277 274 260   BMET/CMET  Lab 05/16/12 0404 05/15/12 1912 05/15/12 1420  NA 138 -- 139  K 3.8 -- 3.4*  CL 103 -- 106  CO2 25 -- 21  BUN 10 -- 12  CREATININE 0.73 0.76 0.71  CALCIUM 9.2 -- 9.0  PROT -- -- --  BILITOT -- -- --  ALKPHOS -- -- --  ALT -- -- --  AST -- -- --  GLUCOSE 115* -- 88   Results for orders placed during the hospital encounter of 05/15/12 (from the past 24 hour(s))  CBC     Status: Normal   Collection Time   05/17/12  6:35 AM      Component Value Range   WBC 9.8  4.0 - 10.5 K/uL   RBC 4.40  3.87 - 5.11 MIL/uL   Hemoglobin 12.4  12.0 - 15.0 g/dL   HCT 14.7  82.9 - 56.2 %   MCV 84.3  78.0 - 100.0 fL   MCH 28.2  26.0 - 34.0 pg   MCHC 33.4  30.0 - 36.0 g/dL  RDW 14.0  11.5 - 15.5 %   Platelets 277  150 - 400 K/uL   Dg Chest Portable 1 View  05/15/2012   IMPRESSION: Slight increased markings right infrahilar region.  This may represent confluence of shadows although subtle infiltrate not excluded.  Original Report Authenticated By: Fuller Canada, M.D.    EKG 05/15/12 Normal sinus rhythm Normal ECG  LE B/L Duplex - Negative for DVT, no evidence of compression difficulties    Assessment  Pt is a 51 y.o. patient with significant PMHx for asthma, depression who presented to the ED with worsening SOB and productive cough for the last month admitted for HCAP.   Plan:  1.HCAP - Pt presented with hypoxia, productive cough x 1 month, and worsening SOB  1) ED got CXR that showed infiltrate in the R infrahilar region, CBCAD of 17 w/ L shift concerning for HCA pneumonia and the fact that she was in the hospital <90 days ago.  2) Will get last dose of Azithromycin today (3/3) and Day 2 of  Levaquin (2/7)  2. Dyspnea/Acute Respiratory Distress vs. Asthma exacerbation   1) Pt has a PMHx signifcant for asthma (Severe persistent) tx with PRN albuterol, Singulair, Advair  2) Continuing home advair, Singulair, and started on Prednisone 50 mg (Total of 10 days, on day number 4) albuterol PRN and scheduled per neb.  Was given one dose of Atrovent neb in ED as well.    3) On maximum outpt advair dose.  Will set up outpt Pulmonary appt, for further assessment of asthma management as had exacerbations in the past couple months.  May be allergy related and need allergy testing as well.  She could be a good candidate for Xolair.  4) O2 saturations were in low 90s upon admission and pt was subsequently started on O2 2L [place] and sat have been >96 since then.  5) D Dimer was ordered since pt had pleurtic chest pain on deep inspitration and was found to be 0.66 (+).  Proceeded to LE Duplex which did not show DVT  6) Has been saturating on RA above 95% for the last 24 hrs  7) Cardiac origin worked up as well and currently EKG is NSR, cardiac markers - x 1.  Will continue to monitor and will get repeat EKG/Cardiac markers if has CP/pressure.  FEN/GI: regular diet, NS at 75 mL/h  Prophylaxis: Lovenox  Disposition: Will monitor for one more day and d/c tomorrow if respiratory status improved. Code Status: Full Code  [name] DO, FMTS PGY-1

## 2012-05-17 NOTE — Progress Notes (Signed)
PHARMACIST - PHYSICIAN COMMUNICATION DR:   [name]/Neal CONCERNING: Antibiotic IV to Oral Route Change Policy  RECOMMENDATION: This patient is receiving Azithromycin and Levaquin by the intravenous route.  Based on criteria approved by the Pharmacy and Therapeutics Committee, the antibiotic(s) is/are being converted to the equivalent oral dose form(s).   DESCRIPTION: These criteria include:  Patient being treated for a respiratory tract infection, urinary tract infection, or cellulitis  The patient is not neutropenic and does not exhibit a GI malabsorption state  The patient is eating (either orally or via tube) and/or has been taking other orally administered medications for a least 24 hours  The patient is improving clinically and has a Tmax < 100.5  If you have questions about this conversion, please contact the Pharmacy Department  []   [phone] )  [facility] [x]   [phone] )  [facility]  []   [phone] )  [facility] []   [phone] )  [facility]   Shannan Garfinkel K. Allena Katz, PharmD, BCPS.  Clinical Pharmacist Pager [phone]. 05/17/2012 7:25 AM

## 2012-05-17 NOTE — Progress Notes (Signed)
FMTS Attending Daily Note: Lydia Levy MD [phone] pager office [phone] I  have seen and examined this patient, reviewed their chart. I have discussed this patient with the resident. I agree with the resident's findings, assessment and care plan. I discussed with her at length whether or not she is ready for d/c--I do not really think she is. Still tachypnic with desaturations on the slightest exertion (going to bathroom) and has not yet walked in halls. She is till very wheezy on exam. Given her history I would tend to hold her at least one more night (if not two) and she tentatively agrees.

## 2012-05-18 LAB — CBC
MCHC: 34.2 g/dL (ref 30.0–36.0)
RDW: 14 % (ref 11.5–15.5)

## 2012-05-18 MED ORDER — LEVOFLOXACIN 750 MG PO TABS: 750.0000 mg | ORAL_TABLET | ORAL | Status: AC

## 2012-05-18 MED ORDER — FLUTICASONE-SALMETEROL 500-50 MCG/DOSE IN AEPB: 1.0000 | INHALATION_SPRAY | Freq: Two times a day (BID) | RESPIRATORY_TRACT | Status: DC

## 2012-05-18 MED ORDER — ALBUTEROL 90 MCG/ACT IN AERS: 2.0000 | INHALATION_SPRAY | RESPIRATORY_TRACT | Status: DC | PRN

## 2012-05-18 NOTE — Progress Notes (Signed)
Family Medicine Teaching Service Daily Progress Note Service Page: [phone]  Patient Assessment: 51 year old female here with possible HCAP and asthma exacerbation  Subjective: Doing well, no complaints  Objective: Temp:  [97.6 F (36.4 C)-98.8 F (37.1 C)] 98.8 F (37.1 C) (07/27 0618) Pulse Rate:  [67-96] 67  (07/27 0618) Resp:  [18-20] 18  (07/27 0618) BP: (97-131)/(63-72) 131/71 mmHg (07/27 0618) SpO2:  [92 %-100 %] 97 % (07/27 0618) FiO2 (%):  [21 %] 21 % (07/26 1147) Exam: General: NAD Cardiovascular: RRR Respiratory: Scattered wheezes bilaterally but good air movement Abdomen: SNTND Extremities: No edema  I have reviewed the patient's medications, labs, imaging, and diagnostic testing.  Notable results are summarized below.  CBC BMET   Lab 05/18/12 0540 05/17/12 0635 05/16/12 0404  WBC 9.7 9.8 18.0*  HGB 12.7 12.4 12.6  HCT 37.1 37.1 37.0  PLT 283 277 274    Lab 05/16/12 0404 05/15/12 1912 05/15/12 1420  NA 138 -- 139  K 3.8 -- 3.4*  CL 103 -- 106  CO2 25 -- 21  BUN 10 -- 12  CREATININE 0.73 0.76 0.71  GLUCOSE 115* -- 88  CALCIUM 9.2 -- 9.0     Imaging/Diagnostic Tests: None  Plan: 1. Asthma Exacerbation: Day 5 of 10 of prednisone, doing well. 1. Cont home advair, singulair and albuterol PRn 2. Outpatient pulm appointment to be scheduled by PCP 3. Significantly improved oxygenation, will likely be able to go home today 2. HCAP, unknown organism: Tx with 3 days of azithro, 7 days levaquin 1. Continue levaquin for 4 additional days 2. F/u CXR in 3-6 weeks as OP to ensure resolution 3. FEN/GI: Regular diet 4. PPx: Lovenox 5. Dispo: Likely home today 6. Code Status: Full  Lydia Abernathy, MD 05/18/2012, 8:28 AM

## 2012-05-19 NOTE — Discharge Summary (Signed)
Family Medicine Teaching Service  Discharge Note : Attending Hamzeh Tall MD Pager [phone] Office [phone] I have seen and examined this patient, reviewed their chart and discussed discharge planning wit the resident at the time of discharge. I agree with the discharge plan as above.  

## 2012-05-22 ENCOUNTER — Ambulatory Visit: Payer: BC Managed Care – PPO | Admitting: Family Medicine

## 2012-06-05 ENCOUNTER — Ambulatory Visit: Payer: BC Managed Care – PPO | Admitting: Family Medicine

## 2012-06-11 ENCOUNTER — Ambulatory Visit: Payer: BC Managed Care – PPO | Admitting: Family Medicine

## 2012-06-18 ENCOUNTER — Telehealth: Payer: Self-pay | Admitting: Family Medicine

## 2012-06-18 ENCOUNTER — Ambulatory Visit (INDEPENDENT_AMBULATORY_CARE_PROVIDER_SITE_OTHER): Payer: BC Managed Care – PPO | Admitting: Family Medicine

## 2012-06-18 ENCOUNTER — Ambulatory Visit: Payer: BC Managed Care – PPO | Admitting: Family Medicine

## 2012-06-18 VITALS — BP 118/80 | HR 84 | Temp 98.0°F | Ht 66.0 in | Wt 158.0 lb

## 2012-06-18 DIAGNOSIS — R21 Rash and other nonspecific skin eruption: Secondary | ICD-10-CM

## 2012-06-18 DIAGNOSIS — J45901 Unspecified asthma with (acute) exacerbation: Secondary | ICD-10-CM

## 2012-06-18 MED ORDER — PREDNISONE 1 MG PO TABS: 10.0000 mg | ORAL_TABLET | Freq: Every day | ORAL | Status: DC

## 2012-06-18 MED ORDER — PREDNISONE 20 MG PO TABS
20.0000 mg | ORAL_TABLET | Freq: Once | ORAL | Status: AC
  Administered 2012-06-18: 20 mg via ORAL

## 2012-06-18 MED ORDER — PREDNISONE 20 MG PO TABS: ORAL_TABLET | ORAL | Status: AC

## 2012-06-18 MED ORDER — PREDNISONE 1 MG PO TABS
10.0000 mg | ORAL_TABLET | Freq: Once | ORAL | Status: DC
  Administered 2012-06-18: 10 mg via ORAL

## 2012-06-18 MED ORDER — FLUTICASONE-SALMETEROL 500-50 MCG/DOSE IN AEPB: 1.0000 | INHALATION_SPRAY | Freq: Two times a day (BID) | RESPIRATORY_TRACT | Status: DC

## 2012-06-18 MED ORDER — PREDNISONE 1 MG PO TABS: 10.0000 mg | ORAL_TABLET | Freq: Once | ORAL | Status: DC

## 2012-06-18 NOTE — Assessment & Plan Note (Signed)
A: asthma exacerbation improving. No evidence of underlying pneumonia or bronchitis on exam. Considered PE, unlikely due to lack of risk factors.  P: -increase advair to BID -prednisone taper: 40 mg in office today, 20 mg BID x 4 days, then 20 mg daily for 6 days  -reviewed s/s to prompt f/u.

## 2012-06-18 NOTE — Patient Instructions (Addendum)
Alnita,  Thank you for coming in today. For your asthma exacerbation please do the following:  1. Prednisone 20 mg twice daily for 4 days, then 20 mg daily for 6 days- 10 day total course 2. Increase advair to twice daily   For rash: prednisone and OTC benadryl cream.  Keep f/u appt on Friday. Call and come in sooner if: shortness of breath worsens, you develop fever, or worsening chest pain.  Dr. Armen Pickup

## 2012-06-18 NOTE — Telephone Encounter (Signed)
Pt has severe asthma, been "struggling" for the last few days. Taking all medications as prescribed. Typically weather related and she feels this may be the case. Does not sound like she is in respiratory distress. Able to speak in full sentences on the phone, but does have occasional cough. She has appt with Dr. Earnest Bailey on Friday but does not think she can wait that long. Made appt with Dr. Gwendolyn Grant this morning to be seen at 9:15.  Darin Arndt M. Leanne Sisler, M.D. 06/18/2012 6:31 AM

## 2012-06-18 NOTE — Progress Notes (Signed)
Subjective:     Patient ID: Lydia Griffin, female   DOB: [birth year], 51 y.o.   MRN: [id number]  HPI 51 yo F presents for same day visit to discuss the following:  1. SOB: x one week. Has history of asthma since childhood. SOB has slightly improved. Now with increased wheezing. She has been taking albuterol and advair once daily. She admits to associated chest tightness and bilateral mid back ache. She denies fever, chest pain, LE edema, prolonged flight or car ride. No known sick contacts. Was last in the hospital with asthma exacerbation on July 2013.   2. Rash: anterior neck. Started on the R side and spread anteriorly. Pruritic. She has used a cream at home that she cannot recall the name of, but she believes it is a steroid cream. No household members with rash.    Review of Systems As per HPI     Objective:   Physical Exam BP 118/80  Pulse 84  Temp 98 F (36.7 C) (Oral)  Ht 5\' 6"  (1.676 m)  Wt 158 lb (71.668 kg)  BMI 25.50 kg/m2 Pulse Ox 94% sitting, 95% ambulatory  General appearance: alert, cooperative and no distress Nose: Nares normal. Septum midline. Mucosa normal. No drainage or sinus tenderness. Throat: lips, mucosa, and tongue normal; teeth and gums normal Neck: no adenopathy, no carotid bruit, no JVD, supple, symmetrical, trachea midline and thyroid not enlarged, symmetric, no tenderness/mass/nodules Lungs: normal work of breathing, inspiratory and expiratory wheezing bilaterally. Breath sounds equal bilaterally.  Heart: regular rate and rhythm, S1, S2 normal, no murmur, click, rub or gallop Skin: macular rash on anterior neck with velvet texture slightly erythematous.  Neurologic: Grossly normal    Assessment and Plan:

## 2012-06-18 NOTE — Assessment & Plan Note (Signed)
A: dermatitis on neck. Appears atopic. P; -oral prednisone -topical benadryl for possible allergic component.

## 2012-06-21 ENCOUNTER — Ambulatory Visit: Payer: BC Managed Care – PPO | Admitting: Family Medicine

## 2012-07-31 ENCOUNTER — Encounter (HOSPITAL_COMMUNITY): Payer: Self-pay | Admitting: Emergency Medicine

## 2012-07-31 ENCOUNTER — Emergency Department (HOSPITAL_COMMUNITY)
Admission: EM | Admit: 2012-07-31 | Discharge: 2012-07-31 | Disposition: A | Discharge status: Home / Self Care | Payer: BC Managed Care – PPO | Attending: Emergency Medicine | Admitting: Emergency Medicine

## 2012-07-31 ENCOUNTER — Emergency Department (HOSPITAL_COMMUNITY): Payer: BC Managed Care – PPO

## 2012-07-31 DIAGNOSIS — S2239XA Fracture of one rib, unspecified side, initial encounter for closed fracture: Secondary | ICD-10-CM

## 2012-07-31 DIAGNOSIS — Y9375 Activity, martial arts: Secondary | ICD-10-CM | POA: Insufficient documentation

## 2012-07-31 DIAGNOSIS — J45901 Unspecified asthma with (acute) exacerbation: Secondary | ICD-10-CM | POA: Insufficient documentation

## 2012-07-31 DIAGNOSIS — S2249XA Multiple fractures of ribs, unspecified side, initial encounter for closed fracture: Secondary | ICD-10-CM | POA: Insufficient documentation

## 2012-07-31 DIAGNOSIS — W219XXA Striking against or struck by unspecified sports equipment, initial encounter: Secondary | ICD-10-CM | POA: Insufficient documentation

## 2012-07-31 LAB — BASIC METABOLIC PANEL WITH GFR
BUN: 10 mg/dL (ref 6–23)
CO2: 26 meq/L (ref 19–32)
Calcium: 9.5 mg/dL (ref 8.4–10.5)
Chloride: 102 meq/L (ref 96–112)
Creatinine, Ser: 0.83 mg/dL (ref 0.50–1.10)
GFR calc Af Amer: >90 mL/min
GFR calc non Af Amer: 80 mL/min — ABNORMAL LOW
Glucose, Bld: 97 mg/dL (ref 70–99)
Potassium: 3.7 meq/L (ref 3.5–5.1)
Sodium: 137 meq/L (ref 135–145)

## 2012-07-31 LAB — CBC WITH DIFFERENTIAL/PLATELET
Basophils Absolute: 0.1 K/uL (ref 0.0–0.1)
Basophils Relative: 1 % (ref 0–1)
Eosinophils Absolute: 0.4 K/uL (ref 0.0–0.7)
Eosinophils Relative: 8 % — ABNORMAL HIGH (ref 0–5)
HCT: 40.3 % (ref 36.0–46.0)
Hemoglobin: 13.9 g/dL (ref 12.0–15.0)
Lymphocytes Relative: 39 % (ref 12–46)
Lymphs Abs: 2 K/uL (ref 0.7–4.0)
MCH: 28.6 pg (ref 26.0–34.0)
MCHC: 34.5 g/dL (ref 30.0–36.0)
MCV: 82.9 fL (ref 78.0–100.0)
Monocytes Absolute: 0.5 K/uL (ref 0.1–1.0)
Monocytes Relative: 10 % (ref 3–12)
Neutro Abs: 2.2 K/uL (ref 1.7–7.7)
Neutrophils Relative %: 42 % — ABNORMAL LOW (ref 43–77)
Platelets: 331 K/uL (ref 150–400)
RBC: 4.86 MIL/uL (ref 3.87–5.11)
RDW: 14.2 % (ref 11.5–15.5)
WBC: 5.2 K/uL (ref 4.0–10.5)

## 2012-07-31 MED ORDER — OXYCODONE-ACETAMINOPHEN 5-325 MG PO TABS
2.0000 | ORAL_TABLET | Freq: Once | ORAL | Status: AC
  Administered 2012-07-31: 2 via ORAL
  Filled 2012-07-31: qty 2

## 2012-07-31 MED ORDER — PREDNISONE 20 MG PO TABS
60.0000 mg | ORAL_TABLET | Freq: Once | ORAL | Status: AC
  Administered 2012-07-31: 60 mg via ORAL
  Filled 2012-07-31: qty 3

## 2012-07-31 MED ORDER — ALBUTEROL (5 MG/ML) CONTINUOUS INHALATION SOLN
10.0000 mg/h | INHALATION_SOLUTION | Freq: Once | RESPIRATORY_TRACT | Status: AC
  Administered 2012-07-31: 10 mg/h via RESPIRATORY_TRACT
  Filled 2012-07-31: qty 20

## 2012-07-31 MED ORDER — OXYCODONE-ACETAMINOPHEN 5-325 MG PO TABS: 1.0000 | ORAL_TABLET | Freq: Four times a day (QID) | ORAL | Status: DC | PRN

## 2012-07-31 MED ORDER — IPRATROPIUM BROMIDE 0.02 % IN SOLN
0.5000 mg | Freq: Once | RESPIRATORY_TRACT | Status: AC
  Administered 2012-07-31: 0.5 mg via RESPIRATORY_TRACT
  Filled 2012-07-31: qty 2.5

## 2012-07-31 MED ORDER — MAGNESIUM SULFATE 40 MG/ML IJ SOLN
2.0000 g | Freq: Once | INTRAMUSCULAR | Status: AC
  Administered 2012-07-31: 2 g via INTRAVENOUS
  Filled 2012-07-31: qty 50

## 2012-07-31 MED ORDER — ALBUTEROL SULFATE (5 MG/ML) 0.5% IN NEBU
5.0000 mg | INHALATION_SOLUTION | Freq: Once | RESPIRATORY_TRACT | Status: AC
  Administered 2012-07-31: 5 mg via RESPIRATORY_TRACT
  Filled 2012-07-31 (×2): qty 0.5

## 2012-07-31 MED ORDER — ALBUTEROL SULFATE (5 MG/ML) 0.5% IN NEBU
5.0000 mg | INHALATION_SOLUTION | Freq: Once | RESPIRATORY_TRACT | Status: AC
  Administered 2012-07-31: 5 mg via RESPIRATORY_TRACT

## 2012-07-31 MED ORDER — PREDNISONE 10 MG PO TABS: ORAL_TABLET | ORAL | Status: DC

## 2012-07-31 MED ORDER — ALBUTEROL SULFATE (5 MG/ML) 0.5% IN NEBU
2.5000 mg | INHALATION_SOLUTION | Freq: Once | RESPIRATORY_TRACT | Status: DC
  Filled 2012-07-31: qty 1

## 2012-07-31 NOTE — ED Provider Notes (Signed)
CDU note:  Patient presented initially to the emergency department with" an asthma attack". The patient attempted to use her inhalers at home but these were unsuccessful. The patient received 2 nebulizer treatments earlier. She then received an additional nebulizer treatment in the emergency department, as well as Solu-Medrol. The patient isn't in the CDU to receive asthma protocol with continuous albuterol and magnesium.  The patient currently speaks in short sentences. Her pulse oximetry is 95% on room air. Her color is good. She has both inspiratory and expiratory wheezes present. Her color is good. She is tolerating medications at this time without problem. It is of note that she has left sided chest/rib pain. Chest xray reveal fx of the 8th and 9th ribs.  6:34pm. Pt speaking in complete sentences. She has completed the continuous neb. Finishing the mag. States she feels much better. 7:45pm. Pt states she feels much better. When ask if see could manage the asthma and SOB at home, she states she does not want to be admitted, an feels she can manage at home with another neb tx. Albuterol continuous  neb ordered. Patient promises to see her primary care physician this week for evaluation and recheck. Patient given incentives barometer to use every 3-4 hours. She is to use her albuterol inhaler every 4 hours. Patient is given a prescription for prednisone taper, she's given a prescription for Percocet # 20 tabs, for her rib pain. Patient is advised to return to the emergency department if any changes, problems, or concerns.  Kathie Dike, [place] 07/31/12 2241

## 2012-07-31 NOTE — Progress Notes (Signed)
Started 10mg  Albuterol neb. Pt states she has tried to do hour long nebs at home for the past two days but hasn't gotten any relief. Pt a little uneasy about the nebs, she says they don't work for her, and all they do is increase her heartrate. Pt has been on treatment now for about 10 min, and heart rate is remaining around 79-80bpm.  Bilateral expiratory wheeze heard. RT will continue to monitor.

## 2012-07-31 NOTE — ED Notes (Signed)
Onset one week ago asthma attack took own inhaler with relief however continued today.  Kicked in kickboxing one week ago left ribs pain currently 6/10 achy dull.

## 2012-07-31 NOTE — ED Notes (Signed)
RT at bedside.

## 2012-07-31 NOTE — ED Provider Notes (Signed)
History     CSN: [id number]  Arrival date & time 07/31/12  1415   First MD Initiated Contact with Patient 07/31/12 1615      Chief Complaint  Patient presents with  . Asthma    (Consider location/radiation/quality/duration/timing/severity/associated sxs/prior treatment) HPI Comments: Patient with a history of Asthma presents today with a chief complaint of shortness of breath, wheezing, and tightness in her chest.  Symptoms have been present for the past week and are progressively worsening.  She has been using Albuterol inhaler and Advair inhaler at home, which has helped with her symptoms somewhat.  However, she did not feel that the Albuterol and Advair helped her symptoms today.  She does not smoke.  She states that 6 days ago she was kicked in the chest while kickboxing and has had pain of the left lower anterior ribcage since that time.  She has been hospitalized for asthma in the past, more recently in July of 2013.  She has also been intubated in the past, but states that it was over 20 years ago.  Her PCP is Dr. Earnest Bailey at [facility].  Patient is a 51 y.o. female presenting with asthma. The history is provided by the patient.  Asthma Associated symptoms include coughing and myalgias. Pertinent negatives include no chills, congestion, diaphoresis or fever.    Past Medical History  Diagnosis Date  . Asthma     Started as a child; Has been intubated for asthma exacerbation in approx 1990    Past Surgical History  Procedure Date  . Tubal ligation   . Hand     flexor tendon repair    Family History  Problem Relation Age of Onset  . Asthma Mother   . Heart disease Father 58    MI  . Alcohol abuse Father   . Asthma Sister 9    death from asthma    History  Substance Use Topics  . Smoking status: Never Smoker   . Smokeless tobacco: Never Used  . Alcohol Use: No    OB History    Grav Para Term Preterm Abortions TAB SAB Ect Mult Living                   Review of Systems  Constitutional: Negative for fever, chills and diaphoresis.  HENT: Negative for congestion.   Respiratory: Positive for cough, chest tightness, shortness of breath and wheezing.   Cardiovascular:       Rib pain  Musculoskeletal: Positive for myalgias.  Neurological: Negative for dizziness, syncope and light-headedness.  All other systems reviewed and are negative.    Allergies  Review of patient's allergies indicates no known allergies.  Home Medications   Current Outpatient Rx  Name Route Sig Dispense Refill  . ALBUTEROL SULFATE (5 MG/ML) 0.5% IN NEBU Nebulization Take 5 mg by nebulization every 6 (six) hours as needed. wheezing    . ALBUTEROL 90 MCG/ACT IN AERS Inhalation Inhale 2 puffs into the lungs every 4 (four) hours as needed for wheezing or shortness of breath. For shortness of breath 17 g 1  . VITAMIN B 12 PO Sublingual Place 1 tablet under the tongue daily.    [name] Kitchen FLUTICASONE-SALMETEROL 500-50 MCG/DOSE IN AEPB Inhalation Inhale 1 puff into the lungs 2 (two) times daily. 60 each   . LORATADINE 10 MG PO TABS Oral Take 10 mg by mouth daily.     [name] Kitchen MONTELUKAST SODIUM 10 MG PO TABS Oral Take 1 tablet (10 mg total) by  mouth at bedtime. 30 tablet 11  . ADULT MULTIVITAMIN W/MINERALS CH Oral Take 1 tablet by mouth daily.      BP 123/61  Pulse 70  Temp 98.1 F (36.7 C) (Oral)  Resp 22  SpO2 100%  Physical Exam  Nursing note and vitals reviewed. Constitutional: She is oriented to person, place, and time. She appears well-developed and well-nourished. No distress.  HENT:  Head: Normocephalic and atraumatic.       Uvula midline, oropharynx clear and moist  Eyes: Conjunctivae normal and EOM are normal. Pupils are equal, round, and reactive to light.  Neck: Normal range of motion.       No stridor.  Neck supple with full range of motion.  Cardiovascular: Normal rate, regular rhythm, normal heart sounds and intact distal pulses.   Pulmonary/Chest:  Tachypnea noted. She has wheezes (expiratory). She exhibits tenderness.       Patient not in respiratory distress.  Mild accessory muscle use.  Patient able to speak in full sentences.  Airway intact.  Lung auscultation positive for bilateral expiratory and inspiratory wheezing  Tenderness to palpation of the left lower anterior ribs  Abdominal: Soft. There is no tenderness.       No tenderness to palpation of the spleen  Musculoskeletal: Normal range of motion. She exhibits no edema and no tenderness.  Neurological: She is alert and oriented to person, place, and time.  Skin: Skin is warm and dry. No rash noted. She is not diaphoretic.  Psychiatric: She has a normal mood and affect. Her behavior is normal.    ED Course  Procedures (including critical care time)  Labs Reviewed  CBC WITH DIFFERENTIAL - Abnormal; Notable for the following:    Neutrophils Relative 42 (*)     Eosinophils Relative 8 (*)     All other components within normal limits  BASIC METABOLIC PANEL   Dg Ribs Unilateral W/chest Left  07/31/2012  *RADIOLOGY REPORT*  Clinical Data: Trauma with left-sided pain  LEFT RIBS AND CHEST - 3+ VIEW  Comparison: 05/15/2012  Findings: Heart size is normal.  There is central bronchial thickening.  No consolidation or collapse.  No pneumothorax or hemothorax.  There are nondisplaced fractures at the anterior eighth and ninth ribs.  IMPRESSION: Nondisplaced fractures of the anterior left eighth and ninth ribs.  Central bronchial thickening consistent with bronchitis.   Original Report Authenticated By: Thomasenia Sales, M.D.      No diagnosis found.  4:46 PM Patient currently getting a continuous neb treatment. 5:13 PM Patient move to the CDU and put on the wheeze protocol.  Patient is not hypoxic.  Patient discussed with Ivery Quale, PA-C the CDU provider who assumes care of the patient at this time.  MDM  Patient with a history of Asthma presents today with shortness of breath  and wheezing.  She was kicked in the ribs six days ago and has sustained a nondisplaced fracture of the anterior left 8th and 9th rib.  Patient given two breathing treatments with minimal improvement prior to my evaluation.  Started the patient on 60 mg Prednisone and Continuous neb.  Patient is not hypoxic.  Patient moved to CDU and put on the Wheeze Protocol.          Pascal Lux [place], PA-C 08/01/12 [phone]

## 2012-07-31 NOTE — ED Notes (Signed)
Pt up ad lib maintaining stats 95%.

## 2012-07-31 NOTE — ED Notes (Signed)
incentive spirometry. X 10 avg. 1500. Given to pt to take home

## 2012-07-31 NOTE — Progress Notes (Signed)
Pt almost done with 10mg  Albuterol neb. After treatment, pt will do peak flow. Pt states her normal average for peak flow at home is around 500. RT will continue to monitor.

## 2012-08-01 NOTE — ED Provider Notes (Signed)
Medical screening examination/treatment/procedure(s) were conducted as a shared visit with non-physician practitioner(s) and myself.  I personally evaluated the patient during the encounter  Hx asthma with wheezing, coughing, congestion.  L rib pain from kickboxing 5 days ago. No abdominal pain.  Diffuse wheezing throughout, no distress, no hypoxia, no retractions. Speaking in full sentences.  EMERGENCY DEPARTMENT [place] FAST EXAM  INDICATIONS:Blunt trauma to the Thorax  PERFORMED BY: Myself  IMAGES ARCHIVED?: No  FINDINGS: All views negative  LIMITATIONS:    INTERPRETATION:  No abdominal free fluid and No pericardial effusion  COMMENT:    CRITICAL CARE Performed by: Glynn Octave   Total critical care time: 30  Critical care time was exclusive of separately billable procedures and treating other patients.  Critical care was necessary to treat or prevent imminent or life-threatening deterioration.  Critical care was time spent personally by me on the following activities: development of treatment plan with patient and/or surrogate as well as nursing, discussions with consultants, evaluation of patient's response to treatment, examination of patient, obtaining history from patient or surrogate, ordering and performing treatments and interventions, ordering and review of laboratory studies, ordering and review of radiographic studies, pulse oximetry and re-evaluation of patient's condition.   Glynn Octave, MD 08/01/12 [phone]

## 2012-08-01 NOTE — ED Provider Notes (Signed)
Medical screening examination/treatment/procedure(s) were conducted as a shared visit with non-physician practitioner(s) and myself.  I personally evaluated the patient during the encounter   Glynn Octave, MD 08/01/12 [phone]

## 2012-08-09 ENCOUNTER — Inpatient Hospital Stay (HOSPITAL_COMMUNITY): Payer: BC Managed Care – PPO

## 2012-08-09 ENCOUNTER — Encounter (HOSPITAL_COMMUNITY): Payer: Self-pay

## 2012-08-09 ENCOUNTER — Inpatient Hospital Stay (HOSPITAL_COMMUNITY)
Admission: AD | Admit: 2012-08-09 | Discharge: 2012-08-11 | DRG: 097 | Disposition: A | Discharge status: Home / Self Care | Payer: BC Managed Care – PPO | Source: Ambulatory Visit | Attending: Family Medicine | Admitting: Family Medicine

## 2012-08-09 ENCOUNTER — Ambulatory Visit (INDEPENDENT_AMBULATORY_CARE_PROVIDER_SITE_OTHER): Payer: BC Managed Care – PPO | Admitting: Family Medicine

## 2012-08-09 ENCOUNTER — Encounter: Payer: Self-pay | Admitting: Family Medicine

## 2012-08-09 VITALS — BP 136/83 | HR 96 | Temp 98.4°F | Wt 155.0 lb

## 2012-08-09 DIAGNOSIS — J45901 Unspecified asthma with (acute) exacerbation: Secondary | ICD-10-CM

## 2012-08-09 DIAGNOSIS — Z23 Encounter for immunization: Secondary | ICD-10-CM

## 2012-08-09 DIAGNOSIS — R1033 Periumbilical pain: Secondary | ICD-10-CM | POA: Diagnosis not present

## 2012-08-09 DIAGNOSIS — S2249XA Multiple fractures of ribs, unspecified side, initial encounter for closed fracture: Secondary | ICD-10-CM | POA: Diagnosis present

## 2012-08-09 DIAGNOSIS — R110 Nausea: Secondary | ICD-10-CM | POA: Diagnosis not present

## 2012-08-09 DIAGNOSIS — T380X5A Adverse effect of glucocorticoids and synthetic analogues, initial encounter: Secondary | ICD-10-CM | POA: Diagnosis present

## 2012-08-09 DIAGNOSIS — R112 Nausea with vomiting, unspecified: Secondary | ICD-10-CM

## 2012-08-09 DIAGNOSIS — J40 Bronchitis, not specified as acute or chronic: Secondary | ICD-10-CM | POA: Diagnosis present

## 2012-08-09 DIAGNOSIS — Y929 Unspecified place or not applicable: Secondary | ICD-10-CM

## 2012-08-09 DIAGNOSIS — X58XXXA Exposure to other specified factors, initial encounter: Secondary | ICD-10-CM | POA: Diagnosis present

## 2012-08-09 LAB — BASIC METABOLIC PANEL
BUN: 10 mg/dL (ref 6–23)
Chloride: 106 mEq/L (ref 96–112)
Creatinine, Ser: 0.86 mg/dL (ref 0.50–1.10)
GFR calc Af Amer: 88 mL/min — ABNORMAL LOW (ref >90)
GFR calc non Af Amer: 76 mL/min — ABNORMAL LOW (ref >90)
Potassium: 4 mEq/L (ref 3.5–5.1)

## 2012-08-09 LAB — CBC WITH DIFFERENTIAL/PLATELET
Basophils Relative: 0 % (ref 0–1)
HCT: 40.1 % (ref 36.0–46.0)
Hemoglobin: 13.9 g/dL (ref 12.0–15.0)
Lymphocytes Relative: 37 % (ref 12–46)
Lymphs Abs: 2.7 10*3/uL (ref 0.7–4.0)
MCHC: 34.7 g/dL (ref 30.0–36.0)
Monocytes Absolute: 0.5 10*3/uL (ref 0.1–1.0)
Monocytes Relative: 7 % (ref 3–12)
Neutro Abs: 3.8 10*3/uL (ref 1.7–7.7)
Neutrophils Relative %: 52 % (ref 43–77)
RBC: 4.9 MIL/uL (ref 3.87–5.11)

## 2012-08-09 MED ORDER — ALBUTEROL SULFATE (5 MG/ML) 0.5% IN NEBU
2.5000 mg | INHALATION_SOLUTION | RESPIRATORY_TRACT | Status: DC | PRN
  Administered 2012-08-09: 2.5 mg via RESPIRATORY_TRACT
  Filled 2012-08-09: qty 0.5

## 2012-08-09 MED ORDER — SODIUM CHLORIDE 0.9 % IV SOLN: 250.0000 mL | INTRAVENOUS | Status: DC | PRN

## 2012-08-09 MED ORDER — PROMETHAZINE HCL 25 MG PO TABS
12.5000 mg | ORAL_TABLET | Freq: Four times a day (QID) | ORAL | Status: DC | PRN
  Administered 2012-08-09: 12.5 mg via ORAL
  Filled 2012-08-09: qty 1

## 2012-08-09 MED ORDER — ONDANSETRON HCL 4 MG PO TABS: 4.0000 mg | ORAL_TABLET | Freq: Three times a day (TID) | ORAL | Status: DC | PRN

## 2012-08-09 MED ORDER — FLUTICASONE-SALMETEROL 500-50 MCG/DOSE IN AEPB
1.0000 | INHALATION_SPRAY | Freq: Two times a day (BID) | RESPIRATORY_TRACT | Status: DC
  Administered 2012-08-09 – 2012-08-11 (×4): 1 via RESPIRATORY_TRACT
  Filled 2012-08-09: qty 14

## 2012-08-09 MED ORDER — DOCUSATE SODIUM 100 MG PO CAPS
100.0000 mg | ORAL_CAPSULE | Freq: Two times a day (BID) | ORAL | Status: DC
  Administered 2012-08-09 – 2012-08-11 (×4): 100 mg via ORAL
  Filled 2012-08-09 (×6): qty 1

## 2012-08-09 MED ORDER — PREDNISONE 50 MG PO TABS
50.0000 mg | ORAL_TABLET | Freq: Every day | ORAL | Status: DC
  Administered 2012-08-10 – 2012-08-11 (×2): 50 mg via ORAL
  Filled 2012-08-09 (×4): qty 1

## 2012-08-09 MED ORDER — HEPARIN SODIUM (PORCINE) 5000 UNIT/ML IJ SOLN
5000.0000 [IU] | Freq: Three times a day (TID) | INTRAMUSCULAR | Status: DC
  Administered 2012-08-09 – 2012-08-10 (×4): 5000 [IU] via SUBCUTANEOUS
  Filled 2012-08-09 (×8): qty 1

## 2012-08-09 MED ORDER — PNEUMOCOCCAL VAC POLYVALENT 25 MCG/0.5ML IJ INJ: 0.5000 mL | INJECTION | Freq: Once | INTRAMUSCULAR | Status: DC

## 2012-08-09 MED ORDER — ONDANSETRON HCL 4 MG/2ML IJ SOLN
4.0000 mg | Freq: Four times a day (QID) | INTRAMUSCULAR | Status: DC | PRN
  Administered 2012-08-09 – 2012-08-10 (×2): 4 mg via INTRAVENOUS
  Filled 2012-08-09 (×2): qty 2

## 2012-08-09 MED ORDER — IPRATROPIUM BROMIDE 0.02 % IN SOLN
0.5000 mg | RESPIRATORY_TRACT | Status: DC
  Administered 2012-08-10 – 2012-08-11 (×8): 0.5 mg via RESPIRATORY_TRACT
  Filled 2012-08-09 (×9): qty 2.5

## 2012-08-09 MED ORDER — ALBUTEROL SULFATE (2.5 MG/3ML) 0.083% IN NEBU
2.5000 mg | INHALATION_SOLUTION | Freq: Once | RESPIRATORY_TRACT | Status: AC
  Administered 2012-08-09: 2.5 mg via RESPIRATORY_TRACT

## 2012-08-09 MED ORDER — ACETAMINOPHEN 325 MG PO TABS
650.0000 mg | ORAL_TABLET | Freq: Four times a day (QID) | ORAL | Status: DC | PRN
  Administered 2012-08-10: 650 mg via ORAL
  Filled 2012-08-09: qty 2

## 2012-08-09 MED ORDER — IPRATROPIUM BROMIDE 0.02 % IN SOLN
0.5000 mg | Freq: Once | RESPIRATORY_TRACT | Status: AC
  Administered 2012-08-09: 0.5 mg via RESPIRATORY_TRACT

## 2012-08-09 MED ORDER — SODIUM CHLORIDE 0.9 % IJ SOLN: 3.0000 mL | INTRAMUSCULAR | Status: DC | PRN

## 2012-08-09 MED ORDER — ALBUTEROL SULFATE (5 MG/ML) 0.5% IN NEBU
2.5000 mg | INHALATION_SOLUTION | RESPIRATORY_TRACT | Status: DC
  Administered 2012-08-10 – 2012-08-11 (×8): 2.5 mg via RESPIRATORY_TRACT
  Filled 2012-08-09 (×8): qty 0.5

## 2012-08-09 MED ORDER — SODIUM CHLORIDE 0.9 % IJ SOLN
3.0000 mL | Freq: Two times a day (BID) | INTRAMUSCULAR | Status: DC
  Administered 2012-08-09 – 2012-08-11 (×4): 3 mL via INTRAVENOUS

## 2012-08-09 MED ORDER — OXYCODONE HCL 5 MG PO TABS
5.0000 mg | ORAL_TABLET | ORAL | Status: DC | PRN
  Administered 2012-08-09: 5 mg via ORAL
  Filled 2012-08-09: qty 1

## 2012-08-09 MED ORDER — ACETAMINOPHEN 650 MG RE SUPP: 650.0000 mg | Freq: Four times a day (QID) | RECTAL | Status: DC | PRN

## 2012-08-09 MED ORDER — PANTOPRAZOLE SODIUM 40 MG PO TBEC
40.0000 mg | DELAYED_RELEASE_TABLET | Freq: Every day | ORAL | Status: DC
  Administered 2012-08-10 – 2012-08-11 (×2): 40 mg via ORAL
  Filled 2012-08-09: qty 1

## 2012-08-09 NOTE — H&P (Signed)
Patient ID: Lydia Griffin, female   DOB: 01/05/1961, 51 y.o.   MRN: [id number] Lydia Griffin is a 51 y.o. female with chronic persistent asthma who presents to [facility] today for asthma exacerbation:  1.  Asthma:  Patient presented to ED on Monday earlier this week for asthma exacerbation.  She was having shortness of breath as well as decreased ability to speak.  Provided multiple nebulizer treatments and prednisone taper and was able to be discharged from the ED.  She was also found to have Left sided rib fracture secondary to kick to chest she suffered during self defense class.    Since then, she did well Monday - Wednesday.  However, starting yesterday she began having increasing dyspnea and dry hacking cough.  She has also had nausea with some vomiting for past 3 days.  Had 1 episode of dark green bowel movement following administration of Pepto bismol for her nausea.  She is able to eat well.  Good po fluid intake.     The following portions of the patient's history were reviewed and updated as appropriate: allergies, current medications, past medical history, family and social history, and problem list.  Patient is a nonsmoker.  Past Medical History  Diagnosis Date  . Asthma     Started as a child; Has been intubated for asthma exacerbation in approx 1990    ROS as above otherwise neg. No Chest pain, palpitations, SOB, Fever, Chills, Abd pain, N/V/D.  Medications reviewed. Current Outpatient Prescriptions  Medication Sig Dispense Refill  . albuterol (PROVENTIL) (5 MG/ML) 0.5% nebulizer solution Take 5 mg by nebulization every 6 (six) hours as needed. wheezing      . albuterol (PROVENTIL,VENTOLIN) 90 MCG/ACT inhaler Inhale 2 puffs into the lungs every 4 (four) hours as needed for wheezing or shortness of breath. For shortness of breath  17 g  1  . Cyanocobalamin (VITAMIN B 12 PO) Place 1 tablet under the tongue daily.      . Fluticasone-Salmeterol (ADVAIR) 500-50 MCG/DOSE AEPB Inhale 1  puff into the lungs 2 (two) times daily.  60 each    . loratadine (CLARITIN) 10 MG tablet Take 10 mg by mouth daily.       . Multiple Vitamin (MULITIVITAMIN WITH MINERALS) TABS Take 1 tablet by mouth daily.      [name] Kitchen oxyCODONE-acetaminophen (PERCOCET/ROXICET) 5-325 MG per tablet Take 1 tablet by mouth every 6 (six) hours as needed for pain.  20 tablet  0  . predniSONE (DELTASONE) 10 MG tablet 5,4,3,2,1 - take with food  15 tablet  0   Current Facility-Administered Medications  Medication Dose Route Frequency Provider Last Rate Last Dose  . albuterol (PROVENTIL) (2.5 MG/3ML) 0.083% nebulizer solution 2.5 mg  2.5 mg Nebulization Once Tobey Grim, MD   2.5 mg at 08/09/12 1618  . ipratropium (ATROVENT) nebulizer solution 0.5 mg  0.5 mg Nebulization Once Tobey Grim, MD   0.5 mg at 08/09/12 1619    Exam:  BP 136/83  Pulse 96  Temp 98.4 F (36.9 C) (Oral)  Wt 155 lb (70.308 kg)  SpO2 95% Gen: WD/WN AAF sitting on edge of bed.  She is speaking in short sentences.  Audibly wheezing.  HEENT: EOMI,  MMM Lungs: Diffuse wheezing in all lung fields.  Poor air movement BL bases.   Heart: RRR Abd: TTP Left upper quadrant.  No guarding or rebound.  Good bowel sounds throughout.   Exts: Non edematous BL  LE, warm and well  perfused.   No results found for this or any previous visit (from the past 72 hour(s)).   1. Asthma exacerbation:  Provided treatment of albuterol in clinic.  Stated that dypsnea moved from 10/10 to 7/10.  Still with cough.  Still not able to speak in long sentences.  Tried Atrovent nebulizer after that.  Still speaking in short sentences, audible wheezing, very little improvement in air movement.  Will admit to regular flood bed for further observation and treatement.  Continue home controller meds.  Increase prednisone back to 50 mg po.  2 view chest xray to further evaluate.    2.  Abdominal Pain:  Described diffuse, generalized abdominal pain that started with increase in  Albuterol usage.  Nausea is main complaint.  She did have 1 episode of dark stool as well as vomiting.  Checking Hgb as well as fecal occult blood.  Phenergan for nausea.    3.  Rib pain:  Secondary to trauma.  Continue Percocet.    4.  FEN/GI:  Regular diet 5.  PPX:  Heparin ppx and protonix due to nausea 6.  Dispo:  Observation overnight.

## 2012-08-09 NOTE — Progress Notes (Signed)
Patient ID: Lydia Griffin, female   DOB: 05/12/1961, 51 y.o.   MRN: [id number] Lydia Griffin is a 51 y.o. female with chronic persistent asthma who presents to [facility] today for asthma exacerbation:  1.  Asthma:  Patient presented to ED on Monday earlier this week for asthma exacerbation.  She was having shortness of breath as well as decreased ability to speak.  Provided multiple nebulizer treatments and prednisone taper and was able to be discharged from the ED.  She was also found to have Left sided rib fracture secondary to kick to chest she suffered during self defense class.    Since then, she did well Monday - Wednesday.  However, starting yesterday she began having increasing dyspnea and dry hacking cough.  She has also had nausea with some vomiting for past 3 days.  Had 1 episode of dark green bowel movement following administration of Pepto bismol for her nausea.  She is able to eat well.  Good po fluid intake.     The following portions of the patient's history were reviewed and updated as appropriate: allergies, current medications, past medical history, family and social history, and problem list.  Patient is a nonsmoker.  Past Medical History  Diagnosis Date  . Asthma     Started as a child; Has been intubated for asthma exacerbation in approx 1990    ROS as above otherwise neg. No Chest pain, palpitations, SOB, Fever, Chills, Abd pain, N/V/D.  Medications reviewed. Current Outpatient Prescriptions  Medication Sig Dispense Refill  . albuterol (PROVENTIL) (5 MG/ML) 0.5% nebulizer solution Take 5 mg by nebulization every 6 (six) hours as needed. wheezing      . albuterol (PROVENTIL,VENTOLIN) 90 MCG/ACT inhaler Inhale 2 puffs into the lungs every 4 (four) hours as needed for wheezing or shortness of breath. For shortness of breath  17 g  1  . Cyanocobalamin (VITAMIN B 12 PO) Place 1 tablet under the tongue daily.      . Fluticasone-Salmeterol (ADVAIR) 500-50 MCG/DOSE AEPB Inhale 1  puff into the lungs 2 (two) times daily.  60 each    . loratadine (CLARITIN) 10 MG tablet Take 10 mg by mouth daily.       . Multiple Vitamin (MULITIVITAMIN WITH MINERALS) TABS Take 1 tablet by mouth daily.      . oxyCODONE-acetaminophen (PERCOCET/ROXICET) 5-325 MG per tablet Take 1 tablet by mouth every 6 (six) hours as needed for pain.  20 tablet  0  . predniSONE (DELTASONE) 10 MG tablet 5,4,3,2,1 - take with food  15 tablet  0   Current Facility-Administered Medications  Medication Dose Route Frequency Provider Last Rate Last Dose  . albuterol (PROVENTIL) (2.5 MG/3ML) 0.083% nebulizer solution 2.5 mg  2.5 mg Nebulization Once Whittaker Lenis H Tvisha Schwoerer, MD   2.5 mg at 08/09/12 1618  . ipratropium (ATROVENT) nebulizer solution 0.5 mg  0.5 mg Nebulization Once Myron Lona H Melville Engen, MD   0.5 mg at 08/09/12 1619    Exam:  BP 136/83  Pulse 96  Temp 98.4 F (36.9 C) (Oral)  Wt 155 lb (70.308 kg)  SpO2 95% Gen: WD/WN AAF sitting on edge of bed.  She is speaking in short sentences.  Audibly wheezing.  HEENT: EOMI,  MMM Lungs: Diffuse wheezing in all lung fields.  Poor air movement BL bases.   Heart: RRR Abd: TTP Left upper quadrant.  No guarding or rebound.  Good bowel sounds throughout.   Exts: Non edematous BL  LE, warm and well   perfused.   No results found for this or any previous visit (from the past 72 hour(s)).   1. Asthma exacerbation:  Provided treatment of albuterol in clinic.  Stated that dypsnea moved from 10/10 to 7/10.  Still with cough.  Still not able to speak in long sentences.  Tried Atrovent nebulizer after that.  Still speaking in short sentences, audible wheezing, very little improvement in air movement.  Will admit to regular flood bed for further observation and treatement.  Continue home controller meds.  Increase prednisone back to 50 mg po.  2 view chest xray to further evaluate.    2.  Abdominal Pain:  Described diffuse, generalized abdominal pain that started with increase in  Albuterol usage.  Nausea is main complaint.  She did have 1 episode of dark stool as well as vomiting.  Checking Hgb as well as fecal occult blood.  Phenergan for nausea.    3.  Rib pain:  Secondary to trauma.  Continue Percocet.    4.  FEN/GI:  Regular diet 5.  PPX:  Heparin ppx and protonix due to nausea 6.  Dispo:  Observation overnight.  

## 2012-08-09 NOTE — Progress Notes (Signed)
S: Saw patient upon arrival to floor.  Patient resting comfortably in bed but continues to report SOB.  Also reporting some nausea.  O: Dffuse inspiratory wheeze, and scattered rhonchi noted on physical exam.  A/P:  Acute exacerbation: Added Duonebs Q4 and Albuterol Q2 PRN, Restarted Advair, Added Prednisone 50 mg daily Nausea: Zofran PRN

## 2012-08-09 NOTE — Addendum Note (Signed)
Addended by: Deno Etienne on: 08/09/2012 05:25 PM   Modules accepted: Orders

## 2012-08-10 LAB — BASIC METABOLIC PANEL
CO2: 22 mEq/L (ref 19–32)
Calcium: 9.1 mg/dL (ref 8.4–10.5)
Chloride: 104 mEq/L (ref 96–112)
Creatinine, Ser: 1.04 mg/dL (ref 0.50–1.10)
GFR calc Af Amer: 70 mL/min — ABNORMAL LOW (ref >90)
Sodium: 137 mEq/L (ref 135–145)

## 2012-08-10 LAB — CBC
MCV: 82.8 fL (ref 78.0–100.0)
Platelets: 331 10*3/uL (ref 150–400)
RBC: 4.72 MIL/uL (ref 3.87–5.11)
RDW: 14 % (ref 11.5–15.5)
WBC: 6.2 10*3/uL (ref 4.0–10.5)

## 2012-08-10 MED ORDER — ALUM & MAG HYDROXIDE-SIMETH 200-200-20 MG/5ML PO SUSP
15.0000 mL | ORAL | Status: DC | PRN
  Filled 2012-08-10: qty 30

## 2012-08-10 NOTE — Progress Notes (Signed)
See my separate note.  I discussed with resident team and I agree with their plan.  Paula Compton, MD

## 2012-08-10 NOTE — Progress Notes (Signed)
FMTS Attending Note Patient seen and examined by me this morning; she reports feeling much improved from her shortness of breath. Still with minimal L sided chest wall pain from rib fracture.  No fevers overnight.  Is not requiring any supplemental oxygen at this time.  She reports that she was on a steroid burst from around 10/09 to 10/14, and that she has had upset stomach since that time.  She remarks that she has some periumbilical discomfort and decreased appetite/early satiety this morning.  She has had some dark pellet-like stool as well.  No blood per rectum.  Her asthma history is marked by distant history of ventilator-support; multiple hospitalizations this summer for asthma flares.  Triggers include emotions, cold air, smoke, cats.  She does not smoke; lives alone.  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  This is difficult with her rib fx.   She uses Advair 500/50 twice daily and lately has been using Albuterol rescue inhaler routinely.  Used to use singulair and this was a useful medicine for her.  Does not take PPI in the outpatient.   Exam Generally well appearing, a bit emotionally labile. No apparent distress.  No nasal canula.  HEENT Neck supple.  COR Regular S1S2 PULM Diffuse wheezes bilaterally.  ABD Soft, nontender, nondistended. Audible bowel sounds.   Assess/Plan: Patient with asthma exacerbation for over 10 days; she appears much improved today.  She believes it is reasonable for her to return home today.  LIves alone.  We will monitor her this morning and see how she does.  Would consider possible discharge later today on her home medications plus a Prednisone taper (50mg  daily for 2-3 days, then decreasing by 10/day every 2nd or 3rd day).  Given her recent and frequent prednisone use, would employ a taper.  Also, to discharge on PPI for what I believe is steroid-induced nausea/abd pain.  Her H/H is stable and I do not have active concern for GI blood loss. Would also  consider addition of adjunctive therapy with Singulair on top of her Advair for chronic use.  Will need quick hospital follow-up in the [facility]; I believe it is reasonable to grant FMLA leave for one week after discharge and to indicate this on her AVS at time of discharge.  Paula Compton, MD

## 2012-08-10 NOTE — Progress Notes (Signed)
Family Medicine Teaching Service Daily Progress Note Service Page: [phone]  Patient Assessment: 51 year old female with chronic persistent asthma and multiple ICU admissions over the last 6 months presenting with asthma exacerbation having failed attempt at outpatient treatment.  Subjective: Feels 75% better, some nausea  Objective: Temp:  [97.9 F (36.6 C)-98.4 F (36.9 C)] 97.9 F (36.6 C) (10/19 0447) Pulse Rate:  [72-96] 72  (10/19 0447) Resp:  [18] 18  (10/19 0447) BP: (103-136)/(55-83) 124/72 mmHg (10/19 0447) SpO2:  [91 %-99 %] 95 % (10/19 0447) Weight:  [153 lb (69.4 kg)-155 lb (70.308 kg)] 153 lb (69.4 kg) (10/18 2158) Exam: General: NAD Cardiovascular: RRR Respiratory: Diffuse wheezes throughout all lung fields, moderate air movement Abdomen: SNTND Extremities: No edema  I have reviewed the patient's medications, labs, imaging, and diagnostic testing.  Notable results are summarized below.  CBC BMET   Lab 08/10/12 0605 08/09/12 1812  WBC 6.2 7.3  HGB 13.9 13.9  HCT 39.1 40.1  PLT 331 318    Lab 08/10/12 0605 08/09/12 1812  NA 137 141  K 3.9 4.0  CL 104 106  CO2 22 26  BUN 11 10  CREATININE 1.04 0.86  GLUCOSE 100* 94  CALCIUM 9.1 9.3     Imaging/Diagnostic Tests: CXR: No evidence of acute cardiopulmonary disease  Plan: 1. Asthma: Due to mutliple ICU admissions am hesitant to de-escalate therapy.  Will plan to continue nebs, prednisone 50 mg daily, and home controller meds throughout the day today.  Recheck patient in PM.  She may be ready for discharge at that point but, due to high bounce back potential and the potential for serious complications including intubation and death, I would lean toward observation until tomorrow morning. 2. Stomach pain/nausea: Likely related to prednisone.  Place on PPI plus maalox PRN. 3. FEN/GI: PO ad lib 4. PPx: SQH, PPI 5. Dispo: Home, possibly this evening, more likely tomorrow 6. Code Status: Full  code  Satoria Dunlop, MD 08/10/2012, 8:38 AM

## 2012-08-11 MED ORDER — OMEPRAZOLE 20 MG PO CPDR: 20.0000 mg | DELAYED_RELEASE_CAPSULE | Freq: Every day | ORAL | Status: DC

## 2012-08-11 MED ORDER — PREDNISONE 10 MG PO TABS: ORAL_TABLET | ORAL | Status: AC

## 2012-08-11 NOTE — Progress Notes (Signed)
FMTS Attending Note Patient seen and examined by me, discussed with resident team and I agree with Dr Patsey Berthold assess/plan for today.  Patient appears much improved and is eager to go home today.  Agree with slower prednisone taper than we might otherwise do, given prolonged nature of her acute illness and prior tendency to worsen after discontinuation of the steroids.  She states that she already has an appointment in [facility] on Tuesday (Oct 22nd). Paula Compton, MD

## 2012-08-11 NOTE — Discharge Summary (Signed)
Family Medicine Teaching [facility] Discharge Summary  Patient name: Lydia Griffin Medical record number: [id number] Date of birth: April 08, 1961 Age: 51 y.o. Gender: female Date of Admission: 08/09/2012  Date of Discharge: 08/11/12 Admitting Physician: Barbaraann Barthel, MD  Primary Care Provider: Delbert Harness, MD  Indication for Hospitalization: Asthma exacerbation Discharge Diagnoses:  Acute Asthma Exacerbation Abdominal pain and nausea Rib Pain  Brief Hospital Course:  51 year old female with chronic persistent asthma and multiple ICU admissions over the last 6 months presenting with asthma exacerbation having failed attempt at outpatient treatment. Patient also has recent rib fracture from self defense class.  1) Acute Asthma Exacerbation Patient was treated with Duonebs Q4 and Albuterol Q2 PRN.  Patient was also continued on home Advair 500-50 mcg 1 puff BID and was started on Prednisone 50 mg daily.  Patient's SOB markedly improved and patient was stable at discharge.  Patient was discharged on extended Prednisone taper (50 mg daily then decrease by 10 mg every 3 days until completion of therapy)  2)  Abdominal pain and nausea Patient reported periumbilical abdominal pain with associated nausea during admission.  This was thought to be secondary to steroid therapy as patient reported symptoms when Prednisone was started.  Patient's nausea and abdominal pain were treated with PRN Zofran, Protonix 40 mg daily, and Maalox PRN.  3) Rib pain secondary to left eighth and ninth rib fracture Treated with Percocet 5-325 mg Q6 PRN.  Pain was well controlled at discharge.   Significant Labs and Imaging:   CBC BMET   Lab 08/10/12 0605 08/09/12 1812  WBC 6.2 7.3  HGB 13.9 13.9  HCT 39.1 40.1  PLT 331 318    Lab 08/10/12 0605 08/09/12 1812  NA 137 141  K 3.9 4.0  CL 104 106  CO2 22 26  BUN 11 10  CREATININE 1.04 0.86  GLUCOSE 100* 94  CALCIUM 9.1 9.3     Dg Ribs Unilateral  W/chest Left 07/31/2012  IMPRESSION: Nondisplaced fractures of the anterior left eighth and ninth ribs.  Central bronchial thickening consistent with bronchitis.  Dg Chest Port 1 View 08/09/2012  IMPRESSION: No evidence of acute cardiopulmonary disease.   Procedures: None  Consultations: None  Discharge Medications:    Medication List     As of 08/11/2012  2:07 PM    TAKE these medications         albuterol (5 MG/ML) 0.5% nebulizer solution   Commonly known as: PROVENTIL   Take 5 mg by nebulization every 6 (six) hours as needed. wheezing      albuterol 90 MCG/ACT inhaler   Commonly known as: PROVENTIL,VENTOLIN   Inhale 2 puffs into the lungs every 4 (four) hours as needed for wheezing or shortness of breath. For shortness of breath      Fluticasone-Salmeterol 500-50 MCG/DOSE Aepb   Commonly known as: ADVAIR   Inhale 1 puff into the lungs 2 (two) times daily.      loratadine 10 MG tablet   Commonly known as: CLARITIN   Take 10 mg by mouth daily.      multivitamin with minerals Tabs   Take 1 tablet by mouth daily.      omeprazole 20 MG capsule   Commonly known as: PRILOSEC   Take 1 capsule (20 mg total) by mouth daily.      oxyCODONE-acetaminophen 5-325 MG per tablet   Commonly known as: PERCOCET/ROXICET   Take 1 tablet by mouth every 6 (six) hours as  needed for pain.      predniSONE 10 MG tablet   Commonly known as: DELTASONE   Start 50 mg daily.  Decrease by 10 mg every 3 days until completion.   Start taking on: 08/12/2012      VITAMIN B 12 PO   Place 1 tablet under the tongue daily.       Issues for Follow Up:  1) Respiratory status and compliance with Prednisone therapy 2) Patient will need FMLA for at least 1 week following discharge date 3) Resolution of abdominal pain/nausea  Outstanding Results: None  Discharge Instructions: Patient was counseled important signs and symptoms that should prompt return to medical care, changes in medications, dietary  instructions, activity restrictions, and follow up appointments.       Follow-up Information    Follow up with Delbert Harness, MD. On 08/13/2012. (8:45 am)    Contact information:   [address] [place] [address] [phone]          Discharge Condition: Stable discharged home.  Adriana Simas Pierre Part, DO 08/11/2012, 2:07 PM

## 2012-08-11 NOTE — Progress Notes (Signed)
Family Medicine Teaching Service Daily Progress Note Service Page: [phone]  Subjective:  Feeling well. No complaints this am.  SOB markedly improved. Denies abdominal pain and nausea this am. Lydia Griffin to go home.  Objective: Temp:  [97.9 F (36.6 C)-98.5 F (36.9 C)] 98.3 F (36.8 C) (10/20 0510) Pulse Rate:  [65-89] 65  (10/20 0510) Resp:  [16-18] 18  (10/20 0510) BP: (100-114)/(49-67) 100/49 mmHg (10/20 0510) SpO2:  [93 %-100 %] 98 % (10/20 0510) Weight:  [153 lb 3.5 oz (69.5 kg)] 153 lb 3.5 oz (69.5 kg) (10/19 2045)  Exam: General: awake, alert, NAD.  Cardiovascular: RRR.  No murmurs, rubs, or gallops. Respiratory: inspiratory wheezing noted.  No Griffin adventitious sounds auscultated. Abdomen: soft, nontender, nondistended. Extremities: no edema noted.  CBC BMET   Lab 08/10/12 0605 08/09/12 1812  WBC 6.2 7.3  HGB 13.9 13.9  HCT 39.1 40.1  PLT 331 318    Lab 08/10/12 0605 08/09/12 1812  NA 137 141  K 3.9 4.0  CL 104 106  CO2 22 26  BUN 11 10  CREATININE 1.04 0.86  GLUCOSE 100* 94  CALCIUM 9.1 9.3     Imaging/Diagnostic Tests: CXR: No evidence of acute cardiopulmonary disease  Assessment/Plan: 51 year old female with chronic persistent asthma and multiple ICU admissions over the last 6 months presenting with asthma exacerbation having failed attempt at outpatient treatment.  Patient also has recent rib fracture from self defense class.  # Asthma - Will continue Prednisone 50 mg daily, Advair 500-50 mcg, and Duonebs Q 4 - Patient has not required any PRN Albuterol since 10/18. - Plan to D/C patient today on Prednisone taper, Albuterol PRN, and Home Advair.  Patient will need quick follow up the family medicine clinic.    # Stomach pain/nausea: Likely related to prednisone.  Currently resolved. - Will continue PPI daily and Maalox PRN  FEN/GI: PO ad lib PPx: SQH, PPI Dispo: D/C today Code Status: Full code  Lydia Other, DO 08/11/2012, 7:23 AM

## 2012-08-12 NOTE — Discharge Summary (Signed)
Patient seen and examined by me on day of discharge; I have spoken with resident team and I agree with plan for discharge today.  Paula Compton, MD

## 2012-08-13 ENCOUNTER — Ambulatory Visit: Payer: BC Managed Care – PPO | Admitting: Family Medicine

## 2012-08-14 ENCOUNTER — Encounter: Payer: Self-pay | Admitting: Family Medicine

## 2012-08-14 ENCOUNTER — Ambulatory Visit (INDEPENDENT_AMBULATORY_CARE_PROVIDER_SITE_OTHER): Payer: BC Managed Care – PPO | Admitting: Family Medicine

## 2012-08-14 VITALS — BP 123/79 | HR 77 | Temp 98.1°F | Ht 66.0 in | Wt 159.0 lb

## 2012-08-14 DIAGNOSIS — J45901 Unspecified asthma with (acute) exacerbation: Secondary | ICD-10-CM

## 2012-08-14 NOTE — Assessment & Plan Note (Signed)
Stable.   Given her frequent exacerbations and rib fracture and high dose prolonged prednisone taper will give following work restrcitions  For the next 2 weeks she should not do any sustained physical activity such as lifting more than 10 lbs or sustained walking or moving.  She can sit but should walk short distances every hour.   She should not be exposed to any airborne chemicals with fumes or any dust or smoke.   After 2 weeks she can do moderate activity such as walking and typing but should not do any heavy lifting or moving for another 2 weeks.

## 2012-08-14 NOTE — Progress Notes (Signed)
  Subjective:    Patient ID: Lydia Griffin, female    DOB: [birth year], 51 y.o.   MRN: [id number]  HPI   Asthma Recently hospitalized.  Feeling better now.  Only mild shortness of breath with walking.  Not using albuterol but is using advair twice daily and prednisone 50 mg daily with plans for a slow taper.  No fever or sputum or edema.   Rib fracture Broke her left ribs while kicking boxing before her hospitiliazation    Review of Symptoms - see HPI  PMH - Smoking status noted.      Review of Systems     Objective:   Physical Exam  Alert no acute distress Lungs:  Normal respiratory effort, chest expands symmetrically but does have pain on left side . Lungs are clear to auscultation, no crackles or wheezes. Heart - Regular rate and rhythm.  No murmurs, gallops or rubs.    Extremities:  No cyanosis, edema, or deformity noted with good range of motion of all major joints.        Assessment & Plan:

## 2012-08-29 ENCOUNTER — Ambulatory Visit: Payer: BC Managed Care – PPO | Admitting: Family Medicine

## 2012-08-30 ENCOUNTER — Encounter: Payer: Self-pay | Admitting: Family Medicine

## 2012-08-30 ENCOUNTER — Ambulatory Visit (INDEPENDENT_AMBULATORY_CARE_PROVIDER_SITE_OTHER): Payer: BC Managed Care – PPO | Admitting: Family Medicine

## 2012-08-30 VITALS — BP 136/84 | HR 96 | Temp 99.0°F | Wt 168.0 lb

## 2012-08-30 DIAGNOSIS — R0781 Pleurodynia: Secondary | ICD-10-CM

## 2012-08-30 DIAGNOSIS — J45909 Unspecified asthma, uncomplicated: Secondary | ICD-10-CM

## 2012-08-30 DIAGNOSIS — R079 Chest pain, unspecified: Secondary | ICD-10-CM

## 2012-08-30 MED ORDER — TRAMADOL HCL 50 MG PO TABS: 50.0000 mg | ORAL_TABLET | Freq: Three times a day (TID) | ORAL | Status: DC | PRN

## 2012-08-30 MED ORDER — CYCLOBENZAPRINE HCL 10 MG PO TABS: 10.0000 mg | ORAL_TABLET | Freq: Three times a day (TID) | ORAL | Status: DC | PRN

## 2012-08-30 NOTE — Progress Notes (Signed)
  Subjective:    Patient ID: Lydia Griffin, female    DOB: [birth year], 51 y.o.   MRN: [id number]  HPI  Patient presents to clinic to discuss LT rib pain and follow up after asthma exacerbation.  LT rib pain: secondary to kickboxing injury several weeks ago.  Patient was kicked in ribcage and fractured ribs 9 & 10 on left.  She still complains of pain, described as "spasms."  Pain is worse when she tries to take deep breaths and wakes up her during sleep at times. She takes Advil which helps a little.  Percocet makes her feel nauseated.    Asthma, follow-up:  Patient was recently seen on 10/23 for hospital follow up.  She was feeling better then.  Today, she complains of chest "tightness" with deep inspiration, and thinks it could be associated with fractured ribs.  Denies any chest pain, SOB at this time.  She continues to use Advair, Claritin, and has been using Albuterol once per day as needed for SOB.  Denies any fever, cough, chills.   Review of Systems  Per HPI    Objective:   Physical Exam  Constitutional: She appears well-nourished. No distress.  Cardiovascular: Normal rate and normal heart sounds.   Pulmonary/Chest: Effort normal. She has wheezes. She has no rales.  Skin:       No erythema or bruising over LT rib cage      Assessment & Plan:

## 2012-08-30 NOTE — Assessment & Plan Note (Signed)
Rib pain secondary to fracture LT rib 9-10 after a kickboxing accident.  Now patient complains of muscle spasms.  Percocet makes her nauseated. - Will treat spasms with Flexeril TID PRN - Tramadol QHS PRN pain - Work note given to excuse patient for additional 2 weeks - Follow up as needed

## 2012-08-30 NOTE — Patient Instructions (Addendum)
It was great to see you today, Lydia Griffin. You are still wheezing a bit.  Use Albuterol inhaler every 4 hours scheduled during the day while awake. For rib pain and spasms, take Flexeril three times per day as needed and Tramadol at bedtime as needed. You can stay out of work x 2 weeks, and return when you are fully recovered. If symptoms worsen or you do not feel better in 2 weeks, please call your PCP or return to clinic. Good luck!

## 2012-08-31 ENCOUNTER — Encounter: Payer: Self-pay | Admitting: Family Medicine

## 2012-08-31 NOTE — Assessment & Plan Note (Addendum)
Continues to have scattered wheezes on exam, but patient feels much better since hospitalization for exacerbation. - Recommended Albuterol every 4 hours while awake x 48 hours - Red flags reviewed with patient and per after visit summary

## 2012-09-07 ENCOUNTER — Telehealth: Payer: Self-pay | Admitting: Family Medicine

## 2012-09-07 MED ORDER — ALBUTEROL SULFATE HFA 108 (90 BASE) MCG/ACT IN AERS: 2.0000 | INHALATION_SPRAY | Freq: Four times a day (QID) | RESPIRATORY_TRACT | Status: DC | PRN

## 2012-09-07 NOTE — Telephone Encounter (Signed)
Using albuterol every 4 hours for the last week since asthma exacerbation that occurred 2 weeks ago. Was seen at clinic on 11/8. Denies any respiratory distress, only some mild incerase in wheezing. Denies any worsening shortness of breath. Peak flow 450's.  Patient states that she ran out of albuterol and doesn't have any refills. Will refill albuterol and told patient to follow up in clinic next week since she continues to use q4 albuterol. Patient expressed understanding.

## 2012-09-16 ENCOUNTER — Ambulatory Visit (INDEPENDENT_AMBULATORY_CARE_PROVIDER_SITE_OTHER): Payer: BC Managed Care – PPO | Admitting: Family Medicine

## 2012-09-16 VITALS — BP 117/77 | HR 83 | Temp 98.3°F | Wt 168.0 lb

## 2012-09-16 DIAGNOSIS — J45909 Unspecified asthma, uncomplicated: Secondary | ICD-10-CM

## 2012-09-16 DIAGNOSIS — R0781 Pleurodynia: Secondary | ICD-10-CM

## 2012-09-16 DIAGNOSIS — R079 Chest pain, unspecified: Secondary | ICD-10-CM

## 2012-09-16 MED ORDER — MONTELUKAST SODIUM 10 MG PO TABS: 10.0000 mg | ORAL_TABLET | Freq: Every day | ORAL | Status: DC

## 2012-09-16 MED ORDER — PREDNISONE 50 MG PO TABS: ORAL_TABLET | ORAL | Status: DC
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
## 2012-09-16 NOTE — Patient Instructions (Addendum)
Prednisone for 5 days  Re-start singulair  Will set up appt for allergy referral  If you aren't able to return to work, we need to see you in the office.

## 2012-09-16 NOTE — Assessment & Plan Note (Signed)
Will rx prednisone, add back singulair, allergy referral.  Wrote out of work for 1 more week.  Will return if not able to return.

## 2012-09-16 NOTE — Assessment & Plan Note (Signed)
Resolved

## 2012-09-19 ENCOUNTER — Encounter (HOSPITAL_COMMUNITY): Payer: Self-pay | Admitting: *Deleted

## 2012-09-19 ENCOUNTER — Observation Stay (HOSPITAL_COMMUNITY)
Admission: EM | Admit: 2012-09-19 | Discharge: 2012-09-20 | Disposition: A | Discharge status: Home / Self Care | Payer: BC Managed Care – PPO | Attending: Family Medicine | Admitting: Family Medicine

## 2012-09-19 ENCOUNTER — Emergency Department (HOSPITAL_COMMUNITY): Payer: BC Managed Care – PPO

## 2012-09-19 DIAGNOSIS — J45909 Unspecified asthma, uncomplicated: Principal | ICD-10-CM | POA: Insufficient documentation

## 2012-09-19 DIAGNOSIS — IMO0002 Reserved for concepts with insufficient information to code with codable children: Secondary | ICD-10-CM | POA: Insufficient documentation

## 2012-09-19 DIAGNOSIS — J45901 Unspecified asthma with (acute) exacerbation: Secondary | ICD-10-CM

## 2012-09-19 HISTORY — DX: Shortness of breath: R06.02

## 2012-09-19 LAB — CBC WITH DIFFERENTIAL/PLATELET
Basophils Absolute: 0 10*3/uL (ref 0.0–0.1)
Basophils Relative: 0 % (ref 0–1)
HCT: 40.5 % (ref 36.0–46.0)
Lymphocytes Relative: 43 % (ref 12–46)
MCHC: 33.6 g/dL (ref 30.0–36.0)
Neutro Abs: 3.2 10*3/uL (ref 1.7–7.7)
Neutrophils Relative %: 46 % (ref 43–77)
Platelets: 344 10*3/uL (ref 150–400)
RDW: 14.2 % (ref 11.5–15.5)
WBC: 6.9 10*3/uL (ref 4.0–10.5)

## 2012-09-19 LAB — BASIC METABOLIC PANEL
CO2: 24 mEq/L (ref 19–32)
Chloride: 105 mEq/L (ref 96–112)
Creatinine, Ser: 1.02 mg/dL (ref 0.50–1.10)
GFR calc Af Amer: 72 mL/min — ABNORMAL LOW (ref >90)
Potassium: 3.7 mEq/L (ref 3.5–5.1)
Sodium: 139 mEq/L (ref 135–145)

## 2012-09-19 MED ORDER — METHYLPREDNISOLONE SODIUM SUCC 40 MG IJ SOLR
40.0000 mg | Freq: Four times a day (QID) | INTRAMUSCULAR | Status: DC
  Administered 2012-09-19: 40 mg via INTRAVENOUS
  Filled 2012-09-19 (×6): qty 1

## 2012-09-19 MED ORDER — ALBUTEROL (5 MG/ML) CONTINUOUS INHALATION SOLN
10.0000 mg/h | INHALATION_SOLUTION | Freq: Once | RESPIRATORY_TRACT | Status: AC
  Administered 2012-09-19: 10 mg/h via RESPIRATORY_TRACT
  Filled 2012-09-19: qty 20

## 2012-09-19 MED ORDER — PANTOPRAZOLE SODIUM 40 MG PO TBEC
40.0000 mg | DELAYED_RELEASE_TABLET | Freq: Every day | ORAL | Status: DC
  Administered 2012-09-20: 40 mg via ORAL
  Filled 2012-09-19: qty 1

## 2012-09-19 MED ORDER — SODIUM CHLORIDE 0.9 % IJ SOLN
3.0000 mL | Freq: Two times a day (BID) | INTRAMUSCULAR | Status: DC
  Administered 2012-09-20: 3 mL via INTRAVENOUS

## 2012-09-19 MED ORDER — MAGNESIUM SULFATE 40 MG/ML IJ SOLN
2.0000 g | Freq: Once | INTRAMUSCULAR | Status: AC
  Administered 2012-09-19: 2 g via INTRAVENOUS
  Filled 2012-09-19: qty 50

## 2012-09-19 MED ORDER — MONTELUKAST SODIUM 10 MG PO TABS
10.0000 mg | ORAL_TABLET | Freq: Every day | ORAL | Status: DC
  Administered 2012-09-19: 10 mg via ORAL
  Filled 2012-09-19 (×2): qty 1

## 2012-09-19 MED ORDER — PREDNISONE 50 MG PO TABS
60.0000 mg | ORAL_TABLET | Freq: Every day | ORAL | Status: DC
  Filled 2012-09-19: qty 1

## 2012-09-19 MED ORDER — SODIUM CHLORIDE 0.9 % IV SOLN: 250.0000 mL | INTRAVENOUS | Status: DC | PRN

## 2012-09-19 MED ORDER — SODIUM CHLORIDE 0.9 % IJ SOLN: 3.0000 mL | INTRAMUSCULAR | Status: DC | PRN

## 2012-09-19 MED ORDER — IPRATROPIUM BROMIDE 0.02 % IN SOLN
0.5000 mg | Freq: Once | RESPIRATORY_TRACT | Status: AC
  Administered 2012-09-19: 0.5 mg via RESPIRATORY_TRACT
  Filled 2012-09-19: qty 2.5

## 2012-09-19 MED ORDER — ALBUTEROL SULFATE (5 MG/ML) 0.5% IN NEBU
5.0000 mg | INHALATION_SOLUTION | Freq: Once | RESPIRATORY_TRACT | Status: AC
  Administered 2012-09-19: 5 mg via RESPIRATORY_TRACT
  Filled 2012-09-19: qty 1

## 2012-09-19 MED ORDER — ACETAMINOPHEN 325 MG PO TABS: 650.0000 mg | ORAL_TABLET | Freq: Four times a day (QID) | ORAL | Status: DC | PRN

## 2012-09-19 MED ORDER — MOMETASONE FURO-FORMOTEROL FUM 200-5 MCG/ACT IN AERO
2.0000 | INHALATION_SPRAY | Freq: Two times a day (BID) | RESPIRATORY_TRACT | Status: DC
  Filled 2012-09-19: qty 8.8

## 2012-09-19 MED ORDER — ALBUTEROL SULFATE (5 MG/ML) 0.5% IN NEBU
5.0000 mg | INHALATION_SOLUTION | RESPIRATORY_TRACT | Status: DC
  Administered 2012-09-19 – 2012-09-20 (×4): 5 mg via RESPIRATORY_TRACT
  Filled 2012-09-19 (×2): qty 0.5
  Filled 2012-09-19: qty 1
  Filled 2012-09-19: qty 0.5

## 2012-09-19 MED ORDER — KETOROLAC TROMETHAMINE 10 MG PO TABS
20.0000 mg | ORAL_TABLET | Freq: Once | ORAL | Status: DC
  Filled 2012-09-19: qty 2

## 2012-09-19 MED ORDER — LORATADINE 10 MG PO TABS
10.0000 mg | ORAL_TABLET | Freq: Every day | ORAL | Status: DC
  Administered 2012-09-19 – 2012-09-20 (×2): 10 mg via ORAL
  Filled 2012-09-19 (×2): qty 1

## 2012-09-19 MED ORDER — ENOXAPARIN SODIUM 40 MG/0.4ML ~~LOC~~ SOLN
40.0000 mg | SUBCUTANEOUS | Status: DC
  Filled 2012-09-19: qty 0.4

## 2012-09-19 MED ORDER — ALBUTEROL SULFATE (5 MG/ML) 0.5% IN NEBU: 5.0000 mg | INHALATION_SOLUTION | RESPIRATORY_TRACT | Status: DC | PRN

## 2012-09-19 MED ORDER — PREDNISONE 20 MG PO TABS
60.0000 mg | ORAL_TABLET | Freq: Once | ORAL | Status: AC
  Administered 2012-09-19: 60 mg via ORAL
  Filled 2012-09-19: qty 3

## 2012-09-19 MED ORDER — TRAMADOL HCL 50 MG PO TABS
50.0000 mg | ORAL_TABLET | ORAL | Status: DC | PRN
  Administered 2012-09-19 – 2012-09-20 (×2): 50 mg via ORAL
  Filled 2012-09-19 (×2): qty 1

## 2012-09-19 MED ORDER — ACETAMINOPHEN 650 MG RE SUPP: 650.0000 mg | Freq: Four times a day (QID) | RECTAL | Status: DC | PRN

## 2012-09-19 NOTE — H&P (Signed)
Family Medicine [facility] Admission History and Physical Service Pager: [phone]  Patient name: Lydia Griffin Medical record number: [id number] Date of birth: 1961/02/29 Age: 51 y.o. Gender: female  Primary Care Provider: Delbert Harness, MD  Chief Complaint: wheezing and shortness-of-breath  Assessment and Plan: Lydia Griffin is a 51 y.o. year old female with a history of persistent asthma who presents with asthma exacerbation.   We will admit for observation.  PULM # Asthma exacerbation, history of persistent asthma, history of intubation for asthma exacerbation -Continue home Advair, Singulair (she just re-started this yesterday) -Albuterol q 2 hours/q 1 hour prn -Monitor of continuous pulse ox. Maintain saturations > 88% -Continue prednisone. She received 60 mg in the ED  FEN/GI -IVF @ [facility] -Diet: regular  PPx -DVT PPx: heparin SQ  DISPO: pending clinical improvement, if she is able to maintain saturations > 88% while ambulating, her WOB is decreased.  She would like to go home tomorrow if possible.      History of Present Illness: Lydia Griffin is a 51 y.o. year old female presenting with wheezing and SOB. She saw her PCP Dr. Earnest Bailey on 11/25 and was experiencing an asthma exacerbation at that time. Dr. Earnest Bailey sent Rx for prednisone, which patient started yesterday at 3 pm. She has continued to use albuterol about every 2 hours while awake. She continued to have breathing problems today and she came to the ED when her albuterol was not helping as much as it had been. She denies fevers, chills, sore throat, or sick contacts. She denies history of smoking but had lived with smokers up until 2 months ago. She now lives alone.   Patient Active Problem List  Diagnosis  . Overweight  . DEPRESSION, MAJOR, RECURRENT  . ASTHMA, PERSISTENT  . FATIGUE  . Palpitation  . Asthma exacerbation  . Rib pain on left side   Past Medical History: Past Medical History    Diagnosis Date  . Asthma     Started as a child; Has been intubated for asthma exacerbation in approx 1990   Past Surgical History: Past Surgical History  Procedure Date  . Tubal ligation   . Hand     flexor tendon repair   Social History: History  Substance Use Topics  . Smoking status: Never Smoker   . Smokeless tobacco: Never Used  . Alcohol Use: No  Per HPI  For any additional social history documentation, please refer to relevant sections of EMR.  Family History: Family History  Problem Relation Age of Onset  . Asthma Mother   . Heart disease Father 85    MI  . Alcohol abuse Father   . Asthma Sister 9    death from asthma   Allergies: No Known Allergies No current facility-administered medications on file prior to encounter.   Current Outpatient Prescriptions on File Prior to Encounter  Medication Sig Dispense Refill  . albuterol (PROVENTIL) (5 MG/ML) 0.5% nebulizer solution Take 5 mg by nebulization every 6 (six) hours as needed. For shortness of breath/wheezing      . Cyanocobalamin (VITAMIN B 12 PO) Place 1 tablet under the tongue daily.      . Fluticasone-Salmeterol (ADVAIR) 500-50 MCG/DOSE AEPB Inhale 1 puff into the lungs 2 (two) times daily.  60 each    . loratadine (CLARITIN) 10 MG tablet Take 10 mg by mouth daily.       . montelukast (SINGULAIR) 10 MG tablet Take 1 tablet (10 mg total) by  mouth at bedtime.  30 tablet  11  . omeprazole (PRILOSEC) 20 MG capsule Take 1 capsule (20 mg total) by mouth daily.  30 capsule  0  . [DISCONTINUED] albuterol (PROVENTIL HFA;VENTOLIN HFA) 108 (90 BASE) MCG/ACT inhaler Inhale 2 puffs into the lungs every 6 (six) hours as needed for wheezing.  1 Inhaler  0  . Multiple Vitamin (MULITIVITAMIN WITH MINERALS) TABS Take 1 tablet by mouth daily.       Review Of Systems: Per HPI with the following additions:  Denies nausea/vomiting/constipation/abdominal pain  Otherwise 12 point review of systems was performed and was  unremarkable.  Physical Exam: BP 131/77  Pulse 113  Temp 98.4 F (36.9 C) (Oral)  Resp 26  SpO2 100% Exam: Gen: NAD, alert, cooperative with exam HEENT: NCAT, EOMI, PERRL CV: RRR, chest tender to palpation, heart sounds difficult to hear because of breath sounds.  Resp: supraclavicular retraction, decreased air movement throughout, loud wheezes audible without use of stethoscope, no obvious crackles Abd: SNTND,  no guarding  Ext: no edema Neuro: alert and oriented, appropriate to questions   Labs and Imaging: CBC BMET   Lab 09/19/12 1703  WBC 6.9  HGB 13.6  HCT 40.5  PLT 344    Lab 09/19/12 1703  NA 139  K 3.7  CL 105  CO2 24  BUN 11  CREATININE 1.02  GLUCOSE 110*  CALCIUM 9.1      Kevin Fenton, MD 09/19/2012, 7:21 PM  Priscella Mann, MD 09/19/2012, 7:38 PM
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
## 2012-09-19 NOTE — ED Notes (Signed)
Respiratory paged for neb

## 2012-09-19 NOTE — ED Notes (Signed)
Patient present to ED with asthma exacerbation.  Patient had albuterol treatment and inhaler pta.  Audible wheezing.

## 2012-09-19 NOTE — ED Notes (Signed)
Pt does not appear to have improved after neb. MD notified

## 2012-09-20 LAB — MRSA PCR SCREENING: MRSA by PCR: NEGATIVE

## 2012-09-20 LAB — BASIC METABOLIC PANEL
BUN: 12 mg/dL (ref 6–23)
CO2: 22 mEq/L (ref 19–32)
Chloride: 101 mEq/L (ref 96–112)
Creatinine, Ser: 0.68 mg/dL (ref 0.50–1.10)
GFR calc Af Amer: >90 mL/min (ref >90)
Potassium: 4.3 mEq/L (ref 3.5–5.1)

## 2012-09-20 LAB — CBC
HCT: 38.8 % (ref 36.0–46.0)
MCV: 84.2 fL (ref 78.0–100.0)
RBC: 4.61 MIL/uL (ref 3.87–5.11)
RDW: 14.3 % (ref 11.5–15.5)
WBC: 8 10*3/uL (ref 4.0–10.5)

## 2012-09-20 MED ORDER — ALBUTEROL SULFATE (5 MG/ML) 0.5% IN NEBU
5.0000 mg | INHALATION_SOLUTION | RESPIRATORY_TRACT | Status: DC
  Administered 2012-09-20: 5 mg via RESPIRATORY_TRACT
  Filled 2012-09-20: qty 0.5

## 2012-09-20 MED ORDER — PREDNISONE 10 MG PO TABS
60.0000 mg | ORAL_TABLET | Freq: Once | ORAL | Status: DC
  Filled 2012-09-20: qty 1

## 2012-09-20 MED ORDER — MOMETASONE FURO-FORMOTEROL FUM 200-5 MCG/ACT IN AERO
2.0000 | INHALATION_SPRAY | Freq: Two times a day (BID) | RESPIRATORY_TRACT | Status: DC
  Administered 2012-09-20: 2 via RESPIRATORY_TRACT
  Filled 2012-09-20: qty 8.8

## 2012-09-20 MED ORDER — ALBUTEROL SULFATE HFA 108 (90 BASE) MCG/ACT IN AERS
2.0000 | INHALATION_SPRAY | RESPIRATORY_TRACT | Status: DC | PRN
  Filled 2012-09-20: qty 6.7

## 2012-09-20 MED ORDER — PREDNISONE 20 MG PO TABS: 60.0000 mg | ORAL_TABLET | Freq: Once | ORAL | Status: DC

## 2012-09-20 NOTE — Progress Notes (Signed)
Patient discharged home via w/c with belongings,  discharge instructions, f/u appt. And prescriptions discussed and explained to pt. No voice complaints at this time. Beryle Quant

## 2012-09-20 NOTE — H&P (Signed)
Seen and examined.  Chart reviewed.  Discussed with Dr. Madolyn Frieze.  Agree with her documentation and management.  Briefly,  51 yo female with known severe persistent asthma presents with an exacerbation.  Trigger is unknown.  She is very knowledgeable about her illness and compliant with her meds.  Today, she feels much better - "I am about 80-85% back to my baseline."   Lungs: good air movement.  Scattered end insp and end exp wheeze.  Mildly prolonged exp phase. Imp: Asthma exacerbation caught early in a patient with severe asthma.  She and I believe it is safe for her to go home later today given her improvement and current status.
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
## 2012-09-20 NOTE — Progress Notes (Signed)
Utilization review completed.  

## 2012-09-20 NOTE — Discharge Summary (Signed)
Physician Discharge Summary  Patient ID: Lydia Griffin MRN: [id number] DOB/AGE: 11/24/1960 51 y.o.  Admit date: 09/19/2012 Discharge date: 09/20/2012  Admission Diagnoses: asthma exacerbation  Discharge Diagnoses: asthma exacerbation  Discharged Condition: stable  Hospital Course: [name] is a 51 year old African-American with a history of severe asthma on Advair and Singulair who presented to the ED in the afternoon on 09/19/2012 with an asthma exacerbation. She had seen her PCP earlier that week with an asthma exacerbation but only started the prescribed prednisone course the day prior to coming to the ED due to not having money until then. She received several doses of albuterol and ipratropium in the ED, a dose of prednisone, and a dose of magnesium, however, she continued to have increased work-of-breathing and significant wheezing and so was admitted for observation. The patient did fair overnight. She was receiving albuterol every 2 hours scheduled. She did not need her prn albuterol. She was spaced to every 4 hours scheduled and 2 hours as needed, and she did well on this regimen on hospital day 2. Prior to discharge, she was able to maintain oxygen saturations > 90 % while ambulating, and she was discharged home and advised to complete her prednisone course, continue Singulair and Advair, and advised albuterol inhaler every 4 hours scheduled and 2 hours as needed for the next couple of days.   Significant Diagnostic Studies:  CXR portable on admission: 1. No evidence of acute cardiopulmonary disease. 2. Mild chronic peribronchial thickening which may be a reflection of this patient's known asthma.  Disposition: 01-Home or Self Care      Discharge Orders    Future Orders Please Complete By Expires   Diet - low sodium heart healthy      Increase activity slowly          Medication List     As of 09/20/2012  1:27 PM    TAKE these medications         albuterol (5 MG/ML) 0.5%  nebulizer solution   Commonly known as: PROVENTIL   Take 5 mg by nebulization every 6 (six) hours as needed. For shortness of breath/wheezing      albuterol 108 (90 BASE) MCG/ACT inhaler   Commonly known as: PROVENTIL HFA;VENTOLIN HFA   Inhale 2 puffs into the lungs every 4 (four) hours as needed. For shortness of breath/wheezing      Fluticasone-Salmeterol 500-50 MCG/DOSE Aepb   Commonly known as: ADVAIR   Inhale 1 puff into the lungs 2 (two) times daily.      loratadine 10 MG tablet   Commonly known as: CLARITIN   Take 10 mg by mouth daily.      montelukast 10 MG tablet   Commonly known as: SINGULAIR   Take 1 tablet (10 mg total) by mouth at bedtime.      multivitamin with minerals Tabs   Take 1 tablet by mouth daily.      omeprazole 20 MG capsule   Commonly known as: PRILOSEC   Take 1 capsule (20 mg total) by mouth daily.      predniSONE 20 MG tablet   Commonly known as: DELTASONE   Take 3 tablets (60 mg total) by mouth once.      VITAMIN B 12 PO   Place 1 tablet under the tongue daily.        Follow-up Information    Call Delbert Harness, MD. (Call on Monday 12/02 and make an appointment to be seen on Monday or Tuesday)  Contact information:   [address] [place] [address] [phone]         Signed: Majel Homer 09/20/2012, 1:27 PM

## 2012-09-20 NOTE — Discharge Summary (Signed)
Seen and examined.  Agree with DC as outlined by Dr. Louanne Belton.

## 2012-09-20 NOTE — Progress Notes (Signed)
Seen and examined.  See my co sign of the H&PE for my note of today. 

## 2012-09-23 ENCOUNTER — Encounter: Payer: Self-pay | Admitting: Family Medicine

## 2012-09-23 ENCOUNTER — Ambulatory Visit: Payer: BC Managed Care – PPO | Admitting: Family Medicine

## 2012-09-23 ENCOUNTER — Telehealth: Payer: Self-pay | Admitting: Family Medicine

## 2012-09-23 NOTE — Telephone Encounter (Signed)
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  Patient is calling because she is supposed to return to work tonight after an exacerbation with asthma.  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  She has scheduled an appt with Dr. Deirdre Priest because Dr. Earnest Bailey doesn't have an opening, is not in clinic, this afternoon which is when her ride can bring her.  She would like a note extending her return to work date.

## 2012-09-23 NOTE — Telephone Encounter (Signed)
She can get that note at her visit  today

## 2012-09-23 NOTE — Telephone Encounter (Signed)
Error

## 2012-09-24 ENCOUNTER — Encounter: Payer: Self-pay | Admitting: Family Medicine

## 2012-09-24 ENCOUNTER — Ambulatory Visit (INDEPENDENT_AMBULATORY_CARE_PROVIDER_SITE_OTHER): Payer: BC Managed Care – PPO | Admitting: Family Medicine

## 2012-09-24 VITALS — BP 134/75 | HR 99 | Temp 98.5°F | Ht 66.0 in | Wt 173.0 lb

## 2012-09-24 DIAGNOSIS — J45909 Unspecified asthma, uncomplicated: Secondary | ICD-10-CM

## 2012-09-24 MED ORDER — PREDNISONE 50 MG PO TABS: 50.0000 mg | ORAL_TABLET | Freq: Every day | ORAL | Status: DC

## 2012-09-24 NOTE — Progress Notes (Signed)
  Subjective:    Patient ID: Lydia Griffin, female    DOB: 08/11/1961, 51 y.o.   MRN: [id number]  HPI  Hospital follow-up for asthma exacerbation  Was seen in office 11/25, rxed prednisone.  Was not able to start until 11/27.  11/28 was admitted overnight and discharged the next day on home regimen.  Patient reports mild dyspnea.  Improving.  Review of Systemssee HPI     Objective:   Physical Exam  GEN: Alert & Oriented, No acute distress CV:  Regular Rate & Rhythm, no murmur Respiratory:  Normal work of breathing, wheezes throughout        Assessment & Plan:

## 2012-09-24 NOTE — Patient Instructions (Addendum)
Return to work on Monday  Keep allergist appointment tomorrow  Follow-up if not improving

## 2012-09-24 NOTE — Assessment & Plan Note (Signed)
Resolving asthma exacerbation.  Will write out of work until Monday.  Will extend prednisone course 5 more days given wheezes on exam today.  Has allergy follow-up tomorrow.  Patient to return if not able to return to work.

## 2012-09-24 NOTE — Telephone Encounter (Signed)
This encounter was created in error - please disregard.

## 2012-11-12 ENCOUNTER — Ambulatory Visit: Payer: BC Managed Care – PPO | Admitting: Family Medicine

## 2012-11-13 ENCOUNTER — Encounter: Payer: Self-pay | Admitting: Family Medicine

## 2012-11-13 ENCOUNTER — Ambulatory Visit (INDEPENDENT_AMBULATORY_CARE_PROVIDER_SITE_OTHER): Payer: BC Managed Care – PPO | Admitting: Family Medicine

## 2012-11-13 VITALS — BP 128/73 | HR 90 | Temp 99.4°F | Wt 167.0 lb

## 2012-11-13 DIAGNOSIS — J45909 Unspecified asthma, uncomplicated: Secondary | ICD-10-CM

## 2012-11-13 DIAGNOSIS — J029 Acute pharyngitis, unspecified: Secondary | ICD-10-CM

## 2012-11-13 DIAGNOSIS — J45901 Unspecified asthma with (acute) exacerbation: Secondary | ICD-10-CM

## 2012-11-13 LAB — POCT MONO (EPSTEIN BARR VIRUS): Mono, POC: NEGATIVE

## 2012-11-13 LAB — POCT RAPID STREP A (OFFICE): Rapid Strep A Screen: NEGATIVE

## 2012-11-13 MED ORDER — ALBUTEROL SULFATE (2.5 MG/3ML) 0.083% IN NEBU
2.5000 mg | INHALATION_SOLUTION | Freq: Once | RESPIRATORY_TRACT | Status: AC
  Administered 2012-11-13: 2.5 mg via RESPIRATORY_TRACT

## 2012-11-13 MED ORDER — FLUTICASONE-SALMETEROL 500-50 MCG/DOSE IN AEPB: 1.0000 | INHALATION_SPRAY | Freq: Two times a day (BID) | RESPIRATORY_TRACT | Status: DC

## 2012-11-13 MED ORDER — AZITHROMYCIN 250 MG PO TABS: ORAL_TABLET | ORAL | Status: DC

## 2012-11-13 MED ORDER — IPRATROPIUM BROMIDE 0.02 % IN SOLN
0.5000 mg | Freq: Once | RESPIRATORY_TRACT | Status: AC
  Administered 2012-11-13: 0.5 mg via RESPIRATORY_TRACT

## 2012-11-13 MED ORDER — PREDNISONE 50 MG PO TABS: 50.0000 mg | ORAL_TABLET | Freq: Every day | ORAL | Status: DC

## 2012-11-13 NOTE — Assessment & Plan Note (Signed)
Exudative with negative rapid strep and mono. Most likely viral induced, but in rare case this was a false negative then the azithromycin would also cover strep.

## 2012-11-13 NOTE — Progress Notes (Signed)
  Subjective:    Patient ID: Lydia Griffin, female    DOB: [birth year], 52 y.o.   MRN: [id number]  HPI  1. Sore throat, wheezing. 52 yo with history of persistent asthma and multiple admissions presents with 3 days of sore throat and pain with swallowing. Noticed white plaques on tonsils. The throat is slightly improved today, but is having low grade fever at home and wheezing. Used albuterol without much change. She has only been using albuterol bc ran out of dulera that was given during last hospital stay, and didn't have any refills on her advair left. Last asthma attack was last month.  Review of Systems  Denies nasal congestion, rhinorrhea, headache, myalgias, productive cough, chest pain, chest pain.     Objective:   Physical Exam  Vitals reviewed. Constitutional: She is oriented to person, place, and time. She appears well-developed and well-nourished. No distress.  HENT:  Head: Normocephalic and atraumatic.  Right Ear: External ear normal.  Left Ear: External ear normal.  Nose: Nose normal.  Mouth/Throat: Oropharyngeal exudate present.       Small white exudates bilateral tonsils slight erythema.  Eyes: Conjunctivae normal and EOM are normal. Pupils are equal, round, and reactive to light.  Neck: Neck supple. No thyromegaly present.  Cardiovascular: Normal rate, regular rhythm and normal heart sounds.   No murmur heard. Pulmonary/Chest: Effort normal. No respiratory distress. She has wheezes. She has no rales.       Diffuse bilateral expiratory wheezing with slight diminished air movement. The air movement improves after duoneb treatment.    Lymphadenopathy:    She has no cervical adenopathy.  Neurological: She is alert and oriented to person, place, and time.  Skin: No rash noted. She is not diaphoretic.  Psychiatric: She has a normal mood and affect.       Assessment & Plan:

## 2012-11-13 NOTE — Patient Instructions (Addendum)
Nice to meet you. Your strep was negative. You have some wheezing, maybe bronchitis. Take azithromycin and prednisone. Restart advair sent to pharmacy. Use albuterol q 4h as needed. If you have more wheezing, productive cough, fevers or worsened symptom then return to doctor.

## 2012-11-13 NOTE — Assessment & Plan Note (Addendum)
Bronchitis vs viral induced bronchospasm. persisant wheezing after duoneb in office, but without any respiratory distress. she is high risk for decompensation with her history of repeated admissions, so will treat with 5 days azithromycin, prednisone x 5 days burst and albuterol scheduled. Refilled advair (ran out of dulera supplied by hospital about one week ago). Advised to f/u if not improving or symptoms worsen.

## 2012-11-22 ENCOUNTER — Telehealth: Payer: Self-pay | Admitting: *Deleted

## 2012-11-22 NOTE — Telephone Encounter (Signed)
Spoke with patient to find out if she went to the appointment ton 12/5 @ 9:45am at [facility] for her allergies. patient stated that she was unable to go to the appointment due to her not having the co-pay requested/required. She will make another appointment when able to and will contact us to let us know.

## 2012-12-16 ENCOUNTER — Inpatient Hospital Stay (HOSPITAL_COMMUNITY)
Admission: EM | Admit: 2012-12-16 | Discharge: 2012-12-18 | DRG: 096 | Disposition: A | Discharge status: Home / Self Care | Payer: BC Managed Care – PPO | Attending: Family Medicine | Admitting: Family Medicine

## 2012-12-16 ENCOUNTER — Encounter (HOSPITAL_COMMUNITY): Payer: Self-pay | Admitting: Emergency Medicine

## 2012-12-16 ENCOUNTER — Telehealth: Payer: Self-pay | Admitting: Family Medicine

## 2012-12-16 ENCOUNTER — Emergency Department (HOSPITAL_COMMUNITY): Payer: BC Managed Care – PPO

## 2012-12-16 DIAGNOSIS — J45909 Unspecified asthma, uncomplicated: Secondary | ICD-10-CM | POA: Diagnosis present

## 2012-12-16 DIAGNOSIS — J9600 Acute respiratory failure, unspecified whether with hypoxia or hypercapnia: Secondary | ICD-10-CM

## 2012-12-16 DIAGNOSIS — J4550 Severe persistent asthma, uncomplicated: Secondary | ICD-10-CM | POA: Diagnosis present

## 2012-12-16 DIAGNOSIS — J9601 Acute respiratory failure with hypoxia: Secondary | ICD-10-CM

## 2012-12-16 DIAGNOSIS — R5381 Other malaise: Secondary | ICD-10-CM | POA: Diagnosis present

## 2012-12-16 DIAGNOSIS — R002 Palpitations: Secondary | ICD-10-CM

## 2012-12-16 DIAGNOSIS — J45901 Unspecified asthma with (acute) exacerbation: Principal | ICD-10-CM | POA: Diagnosis present

## 2012-12-16 DIAGNOSIS — Z91148 Patient's other noncompliance with medication regimen for other reason: Secondary | ICD-10-CM

## 2012-12-16 DIAGNOSIS — E663 Overweight: Secondary | ICD-10-CM | POA: Diagnosis present

## 2012-12-16 DIAGNOSIS — Z79899 Other long term (current) drug therapy: Secondary | ICD-10-CM

## 2012-12-16 DIAGNOSIS — Z9119 Patient's noncompliance with other medical treatment and regimen: Secondary | ICD-10-CM

## 2012-12-16 DIAGNOSIS — Z6825 Body mass index (BMI) 25.0-25.9, adult: Secondary | ICD-10-CM

## 2012-12-16 DIAGNOSIS — F339 Major depressive disorder, recurrent, unspecified: Secondary | ICD-10-CM | POA: Diagnosis present

## 2012-12-16 DIAGNOSIS — Z9114 Patient's other noncompliance with medication regimen: Secondary | ICD-10-CM

## 2012-12-16 DIAGNOSIS — E876 Hypokalemia: Secondary | ICD-10-CM | POA: Diagnosis present

## 2012-12-16 DIAGNOSIS — Z8249 Family history of ischemic heart disease and other diseases of the circulatory system: Secondary | ICD-10-CM

## 2012-12-16 DIAGNOSIS — Z91199 Patient's noncompliance with other medical treatment and regimen due to unspecified reason: Secondary | ICD-10-CM

## 2012-12-16 LAB — CBC WITH DIFFERENTIAL/PLATELET
Basophils Absolute: 0 10*3/uL (ref 0.0–0.1)
Basophils Relative: 0 % (ref 0–1)
HCT: 41 % (ref 36.0–46.0)
Lymphocytes Relative: 44 % (ref 12–46)
MCHC: 33.7 g/dL (ref 30.0–36.0)
Monocytes Absolute: 0.6 10*3/uL (ref 0.1–1.0)
Neutro Abs: 2.9 10*3/uL (ref 1.7–7.7)
Platelets: 300 10*3/uL (ref 150–400)
RDW: 13.7 % (ref 11.5–15.5)
WBC: 6.5 10*3/uL (ref 4.0–10.5)

## 2012-12-16 LAB — BASIC METABOLIC PANEL
CO2: 26 mEq/L (ref 19–32)
Calcium: 9.4 mg/dL (ref 8.4–10.5)
Chloride: 101 mEq/L (ref 96–112)
Creatinine, Ser: 0.9 mg/dL (ref 0.50–1.10)
GFR calc Af Amer: 84 mL/min — ABNORMAL LOW (ref >90)
Sodium: 136 mEq/L (ref 135–145)

## 2012-12-16 MED ORDER — PREDNISONE 20 MG PO TABS
60.0000 mg | ORAL_TABLET | Freq: Once | ORAL | Status: AC
  Administered 2012-12-16: 60 mg via ORAL
  Filled 2012-12-16: qty 3

## 2012-12-16 MED ORDER — ALBUTEROL SULFATE (5 MG/ML) 0.5% IN NEBU
5.0000 mg | INHALATION_SOLUTION | Freq: Once | RESPIRATORY_TRACT | Status: AC
  Administered 2012-12-16: 5 mg via RESPIRATORY_TRACT
  Filled 2012-12-16: qty 1

## 2012-12-16 MED ORDER — ALBUTEROL SULFATE (5 MG/ML) 0.5% IN NEBU: 2.5000 mg | INHALATION_SOLUTION | RESPIRATORY_TRACT | Status: DC | PRN

## 2012-12-16 MED ORDER — LORATADINE 10 MG PO TABS
10.0000 mg | ORAL_TABLET | Freq: Every day | ORAL | Status: DC
  Administered 2012-12-17 – 2012-12-18 (×2): 10 mg via ORAL
  Filled 2012-12-16 (×2): qty 1

## 2012-12-16 MED ORDER — IPRATROPIUM BROMIDE 0.02 % IN SOLN
0.5000 mg | Freq: Once | RESPIRATORY_TRACT | Status: AC
  Administered 2012-12-16: 0.5 mg via RESPIRATORY_TRACT
  Filled 2012-12-16: qty 2.5

## 2012-12-16 MED ORDER — ACETAMINOPHEN 650 MG RE SUPP: 650.0000 mg | Freq: Four times a day (QID) | RECTAL | Status: DC | PRN

## 2012-12-16 MED ORDER — ACETAMINOPHEN 325 MG PO TABS
650.0000 mg | ORAL_TABLET | Freq: Four times a day (QID) | ORAL | Status: DC | PRN
  Administered 2012-12-16: 650 mg via ORAL
  Filled 2012-12-16: qty 2

## 2012-12-16 MED ORDER — ALBUTEROL (5 MG/ML) CONTINUOUS INHALATION SOLN
10.0000 mg/h | INHALATION_SOLUTION | Freq: Once | RESPIRATORY_TRACT | Status: AC
  Administered 2012-12-16: 10 mg/h via RESPIRATORY_TRACT
  Filled 2012-12-16: qty 20

## 2012-12-16 MED ORDER — SODIUM CHLORIDE 0.9 % IV SOLN
Freq: Once | INTRAVENOUS | Status: AC
  Administered 2012-12-16: 17:00:00 via INTRAVENOUS

## 2012-12-16 MED ORDER — ENOXAPARIN SODIUM 40 MG/0.4ML ~~LOC~~ SOLN
40.0000 mg | SUBCUTANEOUS | Status: DC
  Administered 2012-12-16 – 2012-12-17 (×2): 40 mg via SUBCUTANEOUS
  Filled 2012-12-16 (×3): qty 0.4

## 2012-12-16 MED ORDER — POTASSIUM CHLORIDE CRYS ER 20 MEQ PO TBCR
40.0000 meq | EXTENDED_RELEASE_TABLET | Freq: Three times a day (TID) | ORAL | Status: AC
  Administered 2012-12-16 – 2012-12-17 (×2): 40 meq via ORAL
  Filled 2012-12-16 (×2): qty 2

## 2012-12-16 MED ORDER — PREDNISONE 50 MG PO TABS
50.0000 mg | ORAL_TABLET | Freq: Every day | ORAL | Status: DC
  Administered 2012-12-17 – 2012-12-18 (×2): 50 mg via ORAL
  Filled 2012-12-16 (×3): qty 1

## 2012-12-16 MED ORDER — HYDROXYZINE HCL 50 MG PO TABS
50.0000 mg | ORAL_TABLET | Freq: Three times a day (TID) | ORAL | Status: DC | PRN
  Administered 2012-12-16 – 2012-12-17 (×2): 50 mg via ORAL
  Filled 2012-12-16 (×2): qty 1

## 2012-12-16 MED ORDER — MONTELUKAST SODIUM 10 MG PO TABS
10.0000 mg | ORAL_TABLET | Freq: Every day | ORAL | Status: DC
  Administered 2012-12-16 – 2012-12-17 (×2): 10 mg via ORAL
  Filled 2012-12-16 (×3): qty 1

## 2012-12-16 MED ORDER — ALBUTEROL SULFATE (5 MG/ML) 0.5% IN NEBU
2.5000 mg | INHALATION_SOLUTION | RESPIRATORY_TRACT | Status: DC
  Administered 2012-12-16: 2.5 mg via RESPIRATORY_TRACT
  Filled 2012-12-16: qty 0.5

## 2012-12-16 MED ORDER — ALBUTEROL SULFATE (5 MG/ML) 0.5% IN NEBU
2.5000 mg | INHALATION_SOLUTION | RESPIRATORY_TRACT | Status: DC
  Administered 2012-12-16 – 2012-12-17 (×6): 2.5 mg via RESPIRATORY_TRACT
  Filled 2012-12-16 (×5): qty 0.5

## 2012-12-16 MED ORDER — ALBUTEROL SULFATE (5 MG/ML) 0.5% IN NEBU
2.5000 mg | INHALATION_SOLUTION | Freq: Once | RESPIRATORY_TRACT | Status: AC
  Administered 2012-12-16: 2.5 mg via RESPIRATORY_TRACT
  Filled 2012-12-16: qty 0.5

## 2012-12-16 NOTE — ED Notes (Signed)
Hannah, PA back at the bedside.  

## 2012-12-16 NOTE — ED Provider Notes (Signed)
Patient has history of reactive air disease. She relates she ran out of her Advair inhaler about 2 weeks ago. She reports increasing shortness of breath and wheezing.  I examined the patient after her third nebulizer treatment she still has scattered wheezing and rhonchi. She also appears to have some shortness of breath at rest  Medical screening examination/treatment/procedure(s) were conducted as a shared visit with non-physician practitioner(s) and myself.  I personally evaluated the patient during the encounter  Devoria Albe, MD, Franz Dell, MD 12/16/12 [id number]

## 2012-12-16 NOTE — Progress Notes (Signed)
Pt admitted to the unit. Pt is alert and oriented. Pt oriented to room, staff, and call bell. Bed in lowest position. Full assessment to Epic. Call bell with in reach. Told to call for assists. Will continue to monitor.  Lydia Griffin  

## 2012-12-16 NOTE — Progress Notes (Signed)
  Subjective:    Patient ID: Lydia Griffin, female    DOB: [birth year], 52 y.o.   MRN: [id number]  Asthma She complains of chest tightness and difficulty breathing. Her past medical history is significant for asthma.      Review of Systems     Objective:   Physical Exam  Constitutional: She appears distressed (mild).  Neck:  +Accessory Muscle Use B/L  Cardiovascular: Normal rate and regular rhythm.   Pulmonary/Chest: She has wheezes.  +Rhonchorous sound Bibasilar.  Negative for intercostal retractions           Assessment & Plan:  1) Asthma Exacerbation   -Will transfer to SDU for q2/q1 albuterol  -Will consider Magnesium as well if this does not help  -Will talk to pharmacy in AM for controller medication and cost

## 2012-12-16 NOTE — Progress Notes (Signed)
Pt admitted to the unit. Pt is alert and oriented. Pt oriented to room, staff, and call bell. Bed in lowest position. Full assessment to Epic. Call bell with in reach. Told to call for assists. Will continue to monitor.  Elverta Dimiceli E  

## 2012-12-16 NOTE — Progress Notes (Signed)
Report given to Nedra Hai, Charity fundraiser. Will transfer patient to [id number].

## 2012-12-16 NOTE — ED Notes (Signed)
Admitting physician at the bedside.

## 2012-12-16 NOTE — ED Notes (Signed)
RT called for continuous neb 

## 2012-12-16 NOTE — Progress Notes (Signed)
Report called to [id number]. Nurse unable to take report at this time.

## 2012-12-16 NOTE — Progress Notes (Signed)
Admission assessment pending completion of work up.  Labs, X-ray not available at time of admission request.  Will evaluate once objective data available.  Andrena Mews, DO [facility] Resident - PGY-2 12/16/2012 4:56 PM

## 2012-12-16 NOTE — ED Notes (Signed)
Hannah, PA at the bedside.  

## 2012-12-16 NOTE — H&P (Signed)
Family Medicine Teaching [facility] Admission History and Physical Service Pager: [phone]  Patient name: Lydia Griffin Medical record number: [id number] Date of birth: 01/05/1961 Age: 52 y.o. Gender: female  Primary Care Provider: Delbert Harness, MD  Chief Complaint: difficulty breathing and wheezing  Assessment and Plan: Lydia Griffin is a 52 y.o. year old female with a history of asthma and medication non-compliance due to inability to afford medications presenting with wheezing and shortness of breath x3 days duration.  1. Asthma exacerbation: patient recently not able to take asthma controller medication due to financial restrictions. With increasing difficulty breathing over the past several days and no signs of infectious process on CXR. -will admit to floor status -start on prednisone for asthma exacerbation -albuterol nebs q4/q2 prn-will space as tolerated -will need to investigate helping this patient get her asthma controller medication advair -continue home singulair -will monitor respiratory status-consider O2 as needed  2. Hypokalemia: potentially related to amount of albuterol patient has gotten -will replete with K-dur 40 mEq x2 doses  FEN/GI: regular diet Prophylaxis: lovenox Disposition: admit to floor status, discharge pending improvement in respiratory function and spacing of albuterol treatments Code Status: full  History of Present Illness: Lydia Griffin is a 52 y.o. year old female with a history of asthma and medication non-compliance due to inability to afford medications presenting with wheezing and shortness of breath x3 days duration.   States this started 2-3 days ago with wheezing and shortness of breath. She was still funcitonal at that time. On Saturday she got worse and was using her albuterol inhaler too much. States she used it 2x. Sunday she continued to not feel well and was using albuterol nebulizers every 2-3 hours. Sunday night went to bed  around 11 pm and woke up at 4 am with cough and not able to fall back asleep. Additionally states on Friday and Saturday she was around a smokey environment with her friends.  Denies fever or sick contacts. Denies itchy eyes, sore throat, and rhinorrhea. Has been out of her advair for the past 2 weeks and just using her albuterol.  Additionally patient endorses body aches that she describes occur in the left side of the back of her neck and shoot down to the back of her shoulder blade. These prevent her from leaning her head to the left.  In the ED the patient received 3 albuterol nebulizer treatments and 1 atrovent treatment. She was also given a dose of prednisone.  BMET was notable for potassium of 3.4. CBC unremarkable. CXR showed no focal airspace disease.  Patient Active Problem List  Diagnosis  . Overweight  . DEPRESSION, MAJOR, RECURRENT  . ASTHMA, PERSISTENT  . FATIGUE  . Palpitation  . Asthma exacerbation  . Noncompliance with medication treatment due to underuse of medication   Past Medical History: Past Medical History  Diagnosis Date  . Asthma     Started as a child; Has been intubated for asthma exacerbation in approx 1990  . Shortness of breath     "related to asthma attacks" (09/19/2012)   Past Surgical History: Past Surgical History  Procedure Laterality Date  . Hand      flexor tendon repair  . Tubal ligation  ~ 1985   Social History: History  Substance Use Topics  . Smoking status: Never Smoker   . Smokeless tobacco: Never Used  . Alcohol Use: No   For any additional social history documentation, please refer to relevant sections of EMR.  Family History: Family History  Problem Relation Age of Onset  . Asthma Mother   . Heart disease Father 90    MI  . Alcohol abuse Father   . Asthma Sister 9    death from asthma   Allergies: No Known Allergies No current facility-administered medications on file prior to encounter.   Current Outpatient  Prescriptions on File Prior to Encounter  Medication Sig Dispense Refill  . albuterol (PROVENTIL HFA;VENTOLIN HFA) 108 (90 BASE) MCG/ACT inhaler Inhale 2 puffs into the lungs every 4 (four) hours as needed. For shortness of breath/wheezing      . albuterol (PROVENTIL) (5 MG/ML) 0.5% nebulizer solution Take 5 mg by nebulization every 6 (six) hours as needed. For shortness of breath/wheezing      . Cyanocobalamin (VITAMIN B 12 PO) Place 1 tablet under the tongue daily.      . Fluticasone-Salmeterol (ADVAIR) 500-50 MCG/DOSE AEPB Inhale 1 puff into the lungs 2 (two) times daily.  1 each  2  . loratadine (CLARITIN) 10 MG tablet Take 10 mg by mouth daily.       . montelukast (SINGULAIR) 10 MG tablet Take 1 tablet (10 mg total) by mouth at bedtime.  30 tablet  11  . Multiple Vitamin (MULITIVITAMIN WITH MINERALS) TABS Take 1 tablet by mouth daily.       Review Of Systems: Per HPI with the following additions: none Otherwise 12 point review of systems was performed and was unremarkable.  Physical Exam: BP 137/66  Pulse 102  Temp(Src) 97.7 F (36.5 C) (Oral)  Resp 22  Ht 5\' 6"  (1.676 m)  Wt 157 lb 13.6 oz (71.6 kg)  BMI 25.49 kg/m2  SpO2 96% Exam: General: no acute distress, sitting up in bed receiving a breathing treatment HEENT: NCAT, PERRL, MMM, TMs clear bilaterally Cardiovascular: rrr, no mrg, 2+ distal pulses Respiratory: inspiratory and expiratory wheezes heard bilaterally, no crackles Abdomen: S, NT, ND, +BS Extremities: no edema, no cyanosis Skin: no lesions or rashes noted Neuro: alert, moving all extremities equally, no focal deficits  Labs and Imaging: CBC BMET   Recent Labs Lab 12/16/12 1618  WBC 6.5  HGB 13.8  HCT 41.0  PLT 300    Recent Labs Lab 12/16/12 1618  NA 136  K 3.4*  CL 101  CO2 26  BUN 11  CREATININE 0.90  GLUCOSE 94  CALCIUM 9.4    Dg Chest 2 View  12/16/2012 IMPRESSION:  1.  Diffuse bronchial wall thickening. 2.  No pneumonia.   Original  Report Authenticated By: Signa Kell, M.D.      [name], MD 12/16/2012, 8:29 PM  I examined the patient with Dr. Birdie Sons. I have reviewed the note, made necessary revisions and agree with above.  Ashantia Amaral 12/16/2012, 9:29 PM

## 2012-12-16 NOTE — ED Notes (Signed)
Pt reports increasing difficulty breathing last night. States chest tightness and wheezing. States tried home albuterol inhaler every 2 hours without relief. Pt with increased WOB, difficulty speaking in full sentences.

## 2012-12-16 NOTE — Telephone Encounter (Signed)
Spoke with patient  states she has not had the money to pay for Advair and has been without it for 2 weeks. Now having much wheezing and asthma attack. Advised she needs to go to Urgent Care now. States she does not have the money to pay for Urgent Care , will cost her $20.00 and she is on her way to ED.

## 2012-12-16 NOTE — ED Provider Notes (Signed)
History     CSN: [id number]  Arrival date & time 12/16/12  1340   First MD Initiated Contact with Patient 12/16/12 1433      Chief Complaint  Patient presents with  . Asthma    (Consider location/radiation/quality/duration/timing/severity/associated sxs/prior treatment) Patient is a 52 y.o. female presenting with asthma. The history is provided by the patient.  Asthma This is a recurrent problem. The current episode started yesterday (930pm yesterday). The problem occurs constantly. The problem has been gradually worsening. Associated symptoms include neck pain (separate issue in arm) and numbness (separate issue in arm). Pertinent negatives include no abdominal pain, chest pain or fever. Exacerbated by: she states her asthma seems to be triggered by the weather. Treatments tried: rescue inhalers. The treatment provided mild relief.   Patient is concerned about a pain in her upper back, lateral to her spinous process on the left side. The pain shoots up into her neck and down her left arm. She states the pain is numbness when it radiates to her left arm. The pain is there all the time, worse with pressure and moving her neck. When she is not moving her neck she describes the pain as a pressure. It has been present for the past 48 hours.    Past Medical History  Diagnosis Date  . Asthma     Started as a child; Has been intubated for asthma exacerbation in approx 1990  . Shortness of breath     "related to asthma attacks" (09/19/2012)    Past Surgical History  Procedure Laterality Date  . Hand      flexor tendon repair  . Tubal ligation  ~ 1985    Family History  Problem Relation Age of Onset  . Asthma Mother   . Heart disease Father 57    MI  . Alcohol abuse Father   . Asthma Sister 9    death from asthma    History  Substance Use Topics  . Smoking status: Never Smoker   . Smokeless tobacco: Never Used  . Alcohol Use: No    OB History   Grav Para Term Preterm  Abortions TAB SAB Ect Mult Living                  Review of Systems  Constitutional: Negative for fever.  HENT: Positive for neck pain (separate issue in arm).   Cardiovascular: Negative for chest pain.  Gastrointestinal: Negative for abdominal pain.  Neurological: Positive for numbness (separate issue in arm).  All other systems reviewed and are negative.    Allergies  Review of patient's allergies indicates no known allergies.  Home Medications   Current Outpatient Rx  Name  Route  Sig  Dispense  Refill  . albuterol (PROVENTIL HFA;VENTOLIN HFA) 108 (90 BASE) MCG/ACT inhaler   Inhalation   Inhale 2 puffs into the lungs every 4 (four) hours as needed. For shortness of breath/wheezing         . albuterol (PROVENTIL) (5 MG/ML) 0.5% nebulizer solution   Nebulization   Take 5 mg by nebulization every 6 (six) hours as needed. For shortness of breath/wheezing         . Cyanocobalamin (VITAMIN B 12 PO)   Sublingual   Place 1 tablet under the tongue daily.         . Fluticasone-Salmeterol (ADVAIR) 500-50 MCG/DOSE AEPB   Inhalation   Inhale 1 puff into the lungs 2 (two) times daily.   1 each   2   .  loratadine (CLARITIN) 10 MG tablet   Oral   Take 10 mg by mouth daily.          . montelukast (SINGULAIR) 10 MG tablet   Oral   Take 1 tablet (10 mg total) by mouth at bedtime.   30 tablet   11   . Multiple Vitamin (MULITIVITAMIN WITH MINERALS) TABS   Oral   Take 1 tablet by mouth daily.           BP 129/77  Pulse 79  Temp(Src) 98.7 F (37.1 C) (Oral)  Resp 18  SpO2 100%  Physical Exam  Constitutional: She appears well-developed. No distress.  HENT:  Head: Normocephalic and atraumatic.  Left Ear: External ear normal.  Eyes: Conjunctivae are normal. Pupils are equal, round, and reactive to light.  Neck: No tracheal deviation present.  Cardiovascular: Normal rate, regular rhythm and normal heart sounds.  Exam reveals no gallop and no friction rub.    No murmur heard. Pulmonary/Chest: Effort normal. No stridor. She has wheezes (moderate diffuse inspiratory and expiratory).  Scattered rhonci   Abdominal: Soft.  Musculoskeletal: Normal range of motion.  Reproducible pain lateral to spinous process on the left side. No mass, erythema, swelling, redness.   Neurological: She is alert.  Skin: Skin is warm and dry.  Psychiatric: She has a normal mood and affect. Her behavior is normal.    ED Course  Procedures (including critical care time)  Labs Reviewed  CBC WITH DIFFERENTIAL  BASIC METABOLIC PANEL   No results found.  Reassessed at 1530 s/p neb treatment. Diffused inspiratory and expiratory wheezes and rhonci - mild improvement with breathing tx.   No diagnosis found. Asthma exacerbation     MDM  Patient reports minimal improvements after 3 nebulizer treatments in ED. Due to chronic history of asthma exacerbation and disease process now with minimal improvement with multiple treatments will admit for observation to family practice.        Mora Bellman, PA-C 12/16/12 1627

## 2012-12-16 NOTE — Progress Notes (Signed)
PGY 1 Update: Pt feeling tight and upset she isn't feeling better at this point.  Exam: Decreased Air Movement.  + Inspiratory/Expiratory wheezing diffuse.  + Accessory Muscle Use  A/P Will continue prednisone Will switch to q2/q1  Pt anxious, will give atarax to help with sleep and anxiety.   [name] Paulina Fusi, DO of [facility] 12/16/2012, 9:40 PM

## 2012-12-16 NOTE — Telephone Encounter (Signed)
Having trouble breathing - wants to go to ER

## 2012-12-17 DIAGNOSIS — F339 Major depressive disorder, recurrent, unspecified: Secondary | ICD-10-CM

## 2012-12-17 DIAGNOSIS — J45909 Unspecified asthma, uncomplicated: Secondary | ICD-10-CM

## 2012-12-17 MED ORDER — MOMETASONE FURO-FORMOTEROL FUM 100-5 MCG/ACT IN AERO
2.0000 | INHALATION_SPRAY | Freq: Two times a day (BID) | RESPIRATORY_TRACT | Status: DC
  Administered 2012-12-17 – 2012-12-18 (×2): 2 via RESPIRATORY_TRACT
  Filled 2012-12-17: qty 8.8

## 2012-12-17 MED ORDER — ALBUTEROL SULFATE (5 MG/ML) 0.5% IN NEBU
2.5000 mg | INHALATION_SOLUTION | RESPIRATORY_TRACT | Status: DC
Start: 2012-12-17 — End: 2012-12-18
  Administered 2012-12-17 – 2012-12-18 (×4): 2.5 mg via RESPIRATORY_TRACT
  Filled 2012-12-17 (×5): qty 0.5

## 2012-12-17 MED ORDER — ALBUTEROL SULFATE (5 MG/ML) 0.5% IN NEBU
2.5000 mg | INHALATION_SOLUTION | RESPIRATORY_TRACT | Status: DC | PRN
  Administered 2012-12-17: 2.5 mg via RESPIRATORY_TRACT

## 2012-12-17 NOTE — Care Management Note (Signed)
    Page 1 of 2   12/18/2012     2:24:50 PM   CARE MANAGEMENT NOTE 12/18/2012  Patient:  Lydia Griffin, Lydia Griffin   Account Number:  [id number]  Date Initiated:  12/17/2012  Documentation initiated by:  Donn Pierini  Subjective/Objective Assessment:   Pt admitted with Asthma     Action/Plan:   PTA pt lived at home alone- independent   Anticipated DC Date:  12/18/2012   Anticipated DC Plan:  HOME/SELF CARE      DC Planning Services  CM consult  MATCH Program      Choice offered to / List presented to:             Status of service:  Completed, signed off Medicare Important Message given?   (If response is "NO", the following Medicare IM given date fields will be blank) Date Medicare IM given:   Date Additional Medicare IM given:    Discharge Disposition:  HOME/SELF CARE  Per UR Regulation:  Reviewed for med. necessity/level of care/duration of stay  If discussed at Long Length of Stay Meetings, dates discussed:    Comments:  12/18/12 10:33 Letha Cape RN, BSN [phone] patient is for dc today, she has patient ast form for inhalers and  MD will make sure patient has inhalers at discharge.  Patient has no other needs.  NCM will continue to follow for dc needs.   12/17/12- 1145- Donn Pierini RN, BSN [phone] Referral received for medication assistance Dulera vs Advair- call made to [facility] for cost comparison Dulera out of pocket ranges from $185 up depending on strength and Advair out of pocket cost ranges from $260 and up depending on strength for the diskus. Spoke with Dr. Paulina Fusi regarding cost- plan is to most likely send pt home with [facility]. Spoke with pt at bedside regarding medications- and per pt she has insurance and had been trying to get her inhaler which was Advair from [facility]- she was being told that her cost would be over $200 out of pocket- pt states that she could not afford that- when she checked at [facility] she was told her cost would be $80- by this time  she said she was getting sick and just did not get the inhaler. Explained to her that her co-pay through her insurance should not be as high as $200 and she needed to have her pharmacy run it through her insurance to see what her co-pay would be. Explained to her that if MD sent her out on Dulera that there was assistance through the drug company and info given to her regarding this along with number to call. If pt decides to stay on Advair can also provide info for assistance through the drug company and pt is aware to ask bedside RN to contact this CM this is needed.

## 2012-12-17 NOTE — Clinical Social Work Psychosocial (Signed)
Clinical Social Work Department BRIEF PSYCHOSOCIAL ASSESSMENT 12/17/2012  Patient:  Lydia Griffin, Lydia Griffin     Account Number:  [id number]     Admit date:  12/16/2012  Clinical Social Worker:  Lourdes Sledge  Date/Time:  12/17/2012 12:31 PM  Referred by:  Physician  Date Referred:  12/17/2012 Referred for  Other - See comment   Other Referral:   Questions regarding FMLA.   Interview type:  Patient Other interview type:    PSYCHOSOCIAL DATA Living Status:  ALONE Admitted from facility:   Level of care:   Primary support name:  Pola Corn [phone] Primary support relationship to patient:  SIBLING Degree of support available:   Pt states she does not have very many support systems in her life. Pt did state her niece who lives in [place] is a big support to her however did not disclose her name or contact number.    CURRENT CONCERNS Current Concerns  Other - See comment   Other Concerns:    SOCIAL WORK ASSESSMENT / PLAN CSW received a referral for FMLA paperwork.    CSW contacted FM resident to inform that CSW's do not complete FMLA paperwork however paperwork will need to be completed by MD or PCP.    CSW visited pt room, introduced herself and role. CSW informed pt of where to obtain FMLA paperwork (from her employment HR office,) and to have it signed by her PCP. Pt appreciative however states she needs to work however will conside FMLA if appropriate.    Pt soon after became tearful expressing concerns regarding her hospital bills, affording food, paying her rent/electricity bills. CSW provided active listening and emotional support. CSW provided pt with resources such as applying for food stamps, emergency assistance through [employer] and having CSW/MD write a letter to her landlord. Pt was very receptive and stated she would look into these options and that thankfully her landlord is incredibly understanding of pt medical needs. CSW also  encouraged pt to contact CSW once she discharges so that CSW can provided assistance to pt at the outpatient clinic, pt very appreciative.    CSW explored amount of support pt has. Pt stated her family is not financially reliable or stable to provide pt with any support. Pt states she is the most stable person in her family and often times carries the weight of the family. CSW observed pt became more and more tearful. CSW validated feelings and encouraged pt to take a deep breath and identify 1 thing that is going right in her life. Pt stated thankfully she is only dealing with asthma and nothing else. Pt proceeded to smile and said she was also thankful for her dog.    CSW assessed pt depression. Pt declined SI however reported having passive thoughts of "if I had an asthma attach would I just let it happen or ask for help." Pt reports having a strong faith and receiving hope from God. Pt plans to follow up with CSW at dc where CSW will continue to provide support/resources.   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 CSW provided pt with information regarding food stamps/food pantries. CSW has encouraged pt to contact her at dc to schedule an appt to see  CSW at the clinic.    PATIENTS/FAMILYS RESPONSE TO PLAN OF CARE: Pt laying in bed alert and oriented. Pt presented tearful expressing concern of affording meds/food/rent. Pt receptive resources and agreeable to see CSW in the clinic. Pt reported feeling a "6" on a scale from 1-10,10 feeling great. Pt denies SI, and reported feeling better at the end of assessment.    CSW has signed off.

## 2012-12-17 NOTE — Progress Notes (Signed)
Family Medicine Teaching Service Attending Note  I discussed patient Lydia Griffin  with Dr. Paulina Fusi and reviewed their note for today.  I agree with their assessment and plan.

## 2012-12-17 NOTE — ED Provider Notes (Signed)
Medical screening examination/treatment/procedure(s) were performed by non-physician practitioner and as supervising physician I was immediately available for consultation/collaboration. Devoria Albe, MD, FACEP   Ward Givens, MD 12/17/12 1026

## 2012-12-17 NOTE — Progress Notes (Signed)
PGY-1 Daily Progress Note Family Medicine Teaching Service [place] R. Bobetta Korf, DO Service Pager: [phone]   Subjective: Pt feeling much better this AM.  Still having some SOB but denies audible wheezing or tiring to breath.   Objective:  VITALS Temp:  [97.6 F (36.4 C)-98.7 F (37.1 C)] 97.7 F (36.5 C) (02/25 0430) Pulse Rate:  [79-102] 79 (02/25 0430) Resp:  [18-31] 31 (02/25 0430) BP: (102-137)/(54-80) 104/54 mmHg (02/25 0430) SpO2:  [86 %-100 %] 100 % (02/25 0748) Weight:  [157 lb 13.6 oz (71.6 kg)] 157 lb 13.6 oz (71.6 kg) (02/24 2013)  In/Out No intake or output data in the 24 hours ending 12/17/12 0801  Physical Exam: General: no acute distress, sitting up in bed receiving a breathing treatment  HEENT: NCAT, PERRL, MMM, TMs clear bilaterally  Cardiovascular: rrr, no mrg, 2+ distal pulses  Respiratory: inspiratory and expiratory wheezes heard bilaterally, no crackles, no accessory muscle use currently  Abdomen: S, NT, ND, +BS  Extremities: no edema, no cyanosis  Skin: no lesions or rashes noted  Neuro: alert, moving all extremities equally, no focal deficits   MEDS Scheduled Meds: . albuterol  2.5 mg Nebulization Q2H  . enoxaparin (LOVENOX) injection  40 mg Subcutaneous Q24H  . loratadine  10 mg Oral Daily  . montelukast  10 mg Oral QHS  . potassium chloride  40 mEq Oral TID WC  . predniSONE  50 mg Oral Q breakfast   Continuous Infusions:  PRN Meds:.acetaminophen, acetaminophen, albuterol, hydrOXYzine  Labs and imaging:   CBC  Recent Labs Lab 12/16/12 1618  WBC 6.5  HGB 13.8  HCT 41.0  PLT 300   BMET/CMET  Recent Labs Lab 12/16/12 1618  NA 136  K 3.4*  CL 101  CO2 26  BUN 11  CREATININE 0.90  CALCIUM 9.4  GLUCOSE 94   Results for orders placed during the hospital encounter of 12/16/12 (from the past 24 hour(s))  CBC WITH DIFFERENTIAL     Status: None   Collection Time    12/16/12  4:18 PM      Result Value Range   WBC 6.5  4.0 - 10.5 K/uL    RBC 4.96  3.87 - 5.11 MIL/uL   Hemoglobin 13.8  12.0 - 15.0 g/dL   HCT 45.4  09.8 - 11.9 %   MCV 82.7  78.0 - 100.0 fL   MCH 27.8  26.0 - 34.0 pg   MCHC 33.7  30.0 - 36.0 g/dL   RDW 14.7  82.9 - 56.2 %   Platelets 300  150 - 400 K/uL   Neutrophils Relative 44  43 - 77 %   Neutro Abs 2.9  1.7 - 7.7 K/uL   Lymphocytes Relative 44  12 - 46 %   Lymphs Abs 2.9  0.7 - 4.0 K/uL   Monocytes Relative 9  3 - 12 %   Monocytes Absolute 0.6  0.1 - 1.0 K/uL   Eosinophils Relative 3  0 - 5 %   Eosinophils Absolute 0.2  0.0 - 0.7 K/uL   Basophils Relative 0  0 - 1 %   Basophils Absolute 0.0  0.0 - 0.1 K/uL  BASIC METABOLIC PANEL     Status: Abnormal   Collection Time    12/16/12  4:18 PM      Result Value Range   Sodium 136  135 - 145 mEq/L   Potassium 3.4 (*) 3.5 - 5.1 mEq/L   Chloride 101  96 - 112 mEq/L  CO2 26  19 - 32 mEq/L   Glucose, Bld 94  70 - 99 mg/dL   BUN 11  6 - 23 mg/dL   Creatinine, Ser 1.61  0.50 - 1.10 mg/dL   Calcium 9.4  8.4 - 09.6 mg/dL   GFR calc non Af Amer 72 (*) >90 mL/min   GFR calc Af Amer 84 (*) >90 mL/min  MRSA PCR SCREENING     Status: None   Collection Time    12/17/12  3:52 AM      Result Value Range   MRSA by PCR NEGATIVE  NEGATIVE   Dg Chest 2 View  12/16/2012  *RADIOLOGY REPORT*  Clinical Data: Asthma.  Shortness of breath.  CHEST - 2 VIEW  Comparison: 09/19/2012  Findings: Heart size is normal.  No pleural effusion or edema. Lung volumes are normal.  There is diffuse bilateral bronchial wall thickening.  No airspace consolidation or atelectasis.  IMPRESSION:  1.  Diffuse bronchial wall thickening. 2.  No pneumonia.   Original Report Authenticated By: Signa Kell, M.D.     Assessment/Plan Lydia Griffin is a 52 y.o. year old female with a history of asthma and medication non-compliance due to inability to afford medications presenting with wheezing and shortness of breath x3 days duration.   1. Asthma exacerbation: patient recently not able to take  asthma controller medication due to financial restrictions. With increasing difficulty breathing over the past several days and no signs of infectious process on CXR.   1) Continue q2/q1 nebs for now.  If tolerating around 12 PM, will transition to q4/q2 and transfer out of SDU.   2) Continue Prednisone 50 mg x 5 days (2/5)  3) Will talk to her pharmacy ([facility]) about her advair for controller medication to present her from coming back again.   4) Continue home Singulair  5) O2 PRN  2. Hypokalemia: potentially related to amount of albuterol patient has gotten   1) Received KDur 40 meq x 2.  Consider repeating BMP prior to d/c.  FEN/GI: regular diet  Prophylaxis: lovenox  Disposition: SDU, transfer out today and if doing well can d/c home tomorrow with close follow up.  Code Status: full  Jasani Lengel R. Alic Hilburn, DO of [facility] 12/17/2012, 9:00 AM

## 2012-12-17 NOTE — Progress Notes (Signed)
Utilization review completed.  

## 2012-12-17 NOTE — H&P (Signed)
Seen and examined.  Discussed with Dr. Armen Pickup.  Agree with her admit, management and documentation.  Briefly 52 yo with known asthma presents with a flair typical for her.  Cause - she has been out of her advair for 2 weeks.  Issues: 1. Acute asthma exacerbation.  She has moderate persistent asthma that is generally well controled provided she is on her regular controler meds.  She has MANY previous hospitalizations.   2. Social/financial stressors.  Could not afford advair.  Behind on rent.   3. Depression, situational.  Does not desire meds at this time.  Revolves around lack of family support and difficult job situation.  We discussed FMLA as a possible help.    She is improving rapidly.  She would very much like a short hospitalization.  Based on her history, she is a pretty good judge of when she is ready for discharge.

## 2012-12-17 NOTE — Progress Notes (Signed)
Pt transferred to 3306.

## 2012-12-17 NOTE — Progress Notes (Signed)
Admitted Pt from unit 5500 VSS pt resting call bell within reach, descibed 3300 unit and rountine. Pt resting with no complaints at this time.

## 2012-12-18 DIAGNOSIS — R002 Palpitations: Secondary | ICD-10-CM

## 2012-12-18 DIAGNOSIS — R5383 Other fatigue: Secondary | ICD-10-CM

## 2012-12-18 MED ORDER — PNEUMOCOCCAL VAC POLYVALENT 25 MCG/0.5ML IJ INJ
0.5000 mL | INJECTION | INTRAMUSCULAR | Status: AC
  Administered 2012-12-18: 0.5 mL via INTRAMUSCULAR
  Filled 2012-12-18: qty 0.5

## 2012-12-18 MED ORDER — MOMETASONE FURO-FORMOTEROL FUM 100-5 MCG/ACT IN AERO: 2.0000 | INHALATION_SPRAY | Freq: Two times a day (BID) | RESPIRATORY_TRACT | Status: DC

## 2012-12-18 MED ORDER — PREDNISONE 50 MG PO TABS: 50.0000 mg | ORAL_TABLET | Freq: Every day | ORAL | Status: DC

## 2012-12-18 NOTE — Discharge Summary (Signed)
Physician Discharge Summary  Patient ID: Lydia Griffin MRN: [id number] DOB: Nov 09, 1960 Age: 52 y.o.  Admit date: 12/16/2012 Discharge date: 12/18/2012 Admitting Physician: Sanjuana Letters, MD  PCP: Delbert Harness, MD  Consultants:None     Discharge Diagnosis:  Principal Problem:   Asthma exacerbation Active Problems:   ASTHMA, PERSISTENT   Noncompliance with medication treatment due to underuse of medication    Hospital Course Lydia Griffin is a 52 y.o. year old female with a history of asthma and medication non-compliance due to inability to afford medications who presented to the ED with wheezing and shortness of breath x3 days duration.  1) Asthma Exacerbation - Pt presented to the ED with significant wheezing and shortness of breath x 3 days secondary to not being able to afford her Advair for the previous two weeks.  In the ED, pt had a CXR which did not show infectious process, a CBC was WNL, and she received 2 albuterol treatments and one duoneb treatment along with receiving one dose of prednisone.  Pt was admitted to the hospital and required 2 L O2 intiitally to keep her saturations > 92%.  Upon arrival to the floor, she received albuterol q4/q2 but she continued to have wheezing and accessory muscle use.  She was transferred to the SDU and placed on q2/q1 albuterol.  Pt improved over the following day and was transitioned back to q4/q2 and transferred out of the SDU.  Pt continued to do well and was treated with prednisone x 5 days.  Lastly, Care Management was consulted to help with medication cost and it was determined that dulera 100-5 2 puffs BID would be a better financial option for her.  She was sent home on dulera and given one voucher and told to continue her albuterol q4/q2 for the following 24 hrs.  Pt had some scattered inspiratory and expiratory wheezing prior to d/c but was moving air well and did not require O2 for > 24 hrs.   2) Hypokalemia - pt found to have  K+ of 3.4 on admission.  Most likely secondary to albuterol use and given KDur 40 meq x 2.           Discharge PE   Filed Vitals:   12/18/12 0613  BP: 122/75  Pulse: 67  Temp: 98.1 F (36.7 C)  Resp: 18   General: no acute distress HEENT: NCAT, PERRL, MMM Cardiovascular: rrr, no mrg, 2+ distal pulses  Respiratory: Scattered inspiratory and expiratory wheezes heard bilaterally, no crackles, no accessory muscle use currently  Abdomen: S, NT, ND, +BS  Extremities: no edema, no cyanosis  Skin: no lesions or rashes noted  Neuro: alert, moving all extremities equally, no focal deficits      Procedures/Imaging:  Dg Chest 2 View  12/16/2012    IMPRESSION:  1.  Diffuse bronchial wall thickening. 2.  No pneumonia.   Original Report Authenticated By: Signa Kell, M.D.     Labs  CBC  Recent Labs Lab 12/16/12 1618  WBC 6.5  HGB 13.8  HCT 41.0  PLT 300   BMET  Recent Labs Lab 12/16/12 1618  NA 136  K 3.4*  CL 101  CO2 26  BUN 11  CREATININE 0.90  CALCIUM 9.4  GLUCOSE 94        Patient condition at time of discharge/disposition: stable  Disposition-home   Follow up issues: 1. Asthma medications - Should be getting dulera, please initiate conversation about cost of this medication from her  pharmacy to prevent further hospitalizations 2. FMLA paperwork for asthma exacerbations   Discharge follow up:  Follow-up Information   Follow up with Delbert Harness, MD. (Thursday 2/27 @ 145 PM)    Contact information:   [address] [place] [address] [phone]      Discharge Orders   Future Appointments Provider Department Dept Phone   12/19/2012 1:45 PM Macy Mis, MD [facility] [phone]   Future Orders Complete By Expires     Call MD for:  difficulty breathing, headache or visual disturbances  As directed     Call MD for:  temperature >100.4  As directed          Discharge Instructions: Please refer to Patient  Instructions section of EMR for full details.  Patient was counseled important signs and symptoms that should prompt return to medical care, changes in medications, dietary instructions, activity restrictions, and follow up appointments.  Significant instructions noted below:  Discharge Medications   Medication List    STOP taking these medications       Fluticasone-Salmeterol 500-50 MCG/DOSE Aepb  Commonly known as:  ADVAIR      TAKE these medications       albuterol (5 MG/ML) 0.5% nebulizer solution  Commonly known as:  PROVENTIL  Take 5 mg by nebulization every 6 (six) hours as needed. For shortness of breath/wheezing     albuterol 108 (90 BASE) MCG/ACT inhaler  Commonly known as:  PROVENTIL HFA;VENTOLIN HFA  Inhale 2 puffs into the lungs every 4 (four) hours as needed. For shortness of breath/wheezing     loratadine 10 MG tablet  Commonly known as:  CLARITIN  Take 10 mg by mouth daily.     mometasone-formoterol 100-5 MCG/ACT Aero  Commonly known as:  DULERA  Inhale 2 puffs into the lungs 2 (two) times daily.     montelukast 10 MG tablet  Commonly known as:  SINGULAIR  Take 1 tablet (10 mg total) by mouth at bedtime.     multivitamin with minerals Tabs  Take 1 tablet by mouth daily.     predniSONE 50 MG tablet  Commonly known as:  DELTASONE  Take 1 tablet (50 mg total) by mouth daily.     VITAMIN B 12 PO  Place 1 tablet under the tongue daily.         Lydia Cranker, DO of [facility] 12/18/2012 9:08 AM

## 2012-12-18 NOTE — Progress Notes (Signed)
Lydia Griffin discharged Home per MD order.  Discharge instructions reviewed and discussed with the patient, all questions and concerns answered. Copy of instructions and scripts given to patient.  Explained how to sign up for mychart.    Medication List    STOP taking these medications       Fluticasone-Salmeterol 500-50 MCG/DOSE Aepb  Commonly known as:  ADVAIR      TAKE these medications       albuterol (5 MG/ML) 0.5% nebulizer solution  Commonly known as:  PROVENTIL  Take 5 mg by nebulization every 6 (six) hours as needed. For shortness of breath/wheezing     albuterol 108 (90 BASE) MCG/ACT inhaler  Commonly known as:  PROVENTIL HFA;VENTOLIN HFA  Inhale 2 puffs into the lungs every 4 (four) hours as needed. For shortness of breath/wheezing     loratadine 10 MG tablet  Commonly known as:  CLARITIN  Take 10 mg by mouth daily.     mometasone-formoterol 100-5 MCG/ACT Aero  Commonly known as:  DULERA  Inhale 2 puffs into the lungs 2 (two) times daily.     montelukast 10 MG tablet  Commonly known as:  SINGULAIR  Take 1 tablet (10 mg total) by mouth at bedtime.     multivitamin with minerals Tabs  Take 1 tablet by mouth daily.     predniSONE 50 MG tablet  Commonly known as:  DELTASONE  Take 1 tablet (50 mg total) by mouth daily.     VITAMIN B 12 PO  Place 1 tablet under the tongue daily.        Patients skin is clean, dry and intact, no evidence of skin break down. IV site discontinued and catheter remains intact. Site without signs and symptoms of complications. Dressing and pressure applied.  Patient escorted to car by volunteer in a wheelchair,  no distress noted upon discharge.  Lydia Griffin 12/18/2012 1:19 PM

## 2012-12-18 NOTE — Plan of Care (Signed)
Problem: ICU Phase Progression Outcomes Goal: Initial discharge plan identified To return home  Problem: Phase I Progression Outcomes Goal: Discharge plan established Outcome: Completed/Met Date Met:  12/18/12  To return home

## 2012-12-18 NOTE — Discharge Summary (Signed)
.  atendd

## 2012-12-19 ENCOUNTER — Inpatient Hospital Stay: Payer: BC Managed Care – PPO | Admitting: Family Medicine

## 2012-12-26 ENCOUNTER — Encounter: Payer: Self-pay | Admitting: Family Medicine

## 2012-12-26 ENCOUNTER — Ambulatory Visit (INDEPENDENT_AMBULATORY_CARE_PROVIDER_SITE_OTHER): Payer: BC Managed Care – PPO | Admitting: Family Medicine

## 2012-12-26 VITALS — BP 139/85 | HR 92 | Temp 98.2°F | Ht 66.0 in | Wt 172.9 lb

## 2012-12-26 DIAGNOSIS — S139XXA Sprain of joints and ligaments of unspecified parts of neck, initial encounter: Secondary | ICD-10-CM

## 2012-12-26 DIAGNOSIS — F339 Major depressive disorder, recurrent, unspecified: Secondary | ICD-10-CM

## 2012-12-26 DIAGNOSIS — G4700 Insomnia, unspecified: Secondary | ICD-10-CM

## 2012-12-26 DIAGNOSIS — S161XXA Strain of muscle, fascia and tendon at neck level, initial encounter: Secondary | ICD-10-CM

## 2012-12-26 DIAGNOSIS — J45901 Unspecified asthma with (acute) exacerbation: Secondary | ICD-10-CM

## 2012-12-26 MED ORDER — ZOLPIDEM TARTRATE 5 MG PO TABS: 5.0000 mg | ORAL_TABLET | Freq: Every evening | ORAL | Status: DC | PRN

## 2012-12-26 NOTE — Patient Instructions (Addendum)
Please contact Dr. Pascal Lux, clinical psychologist, to set up an appointment.  She can be reached at [phone].   See info on neck strain

## 2012-12-30 DIAGNOSIS — G4700 Insomnia, unspecified: Secondary | ICD-10-CM | POA: Insufficient documentation

## 2012-12-30 DIAGNOSIS — S161XXA Strain of muscle, fascia and tendon at neck level, initial encounter: Secondary | ICD-10-CM | POA: Insufficient documentation

## 2012-12-30 HISTORY — DX: Insomnia, unspecified: G47.00

## 2012-12-30 NOTE — Progress Notes (Signed)
  Subjective:    Patient ID: Lydia Griffin, female    DOB: 10-10-1961, 52 y.o.   MRN: [id number]  HPI Here for hospital follow-up  Admitted 2/24-26 for asthma exacerbation due to running out of asthma inhalers.  Was seen by social work, changed to [place] which patient states is more affordable.  Feels copay will be manageable at this time  Dyspnea improving.  Using albuterol infrequently.    Neck strain: left sided. Started a few weeks ago.  Neck feels tight when she stretches it.  Some tingling in hand.  No radicular pain.  No weakness. No Loss of sensation.  Used a sleep medication while in hospital.  Would like to continue to short term to help with establishing normal sleep cycle.  Has been on trazodone in the past with minimal improvement.   Review of Systems See HPI    Objective:   Physical Exam GEN: Alert & Oriented, No acute distress CV:  Regular Rate & Rhythm, no murmur Respiratory:  Normal work of breathing, CTAB Abd:  + BS, soft, no tenderness to palpation Ext: no pre-tibial edema MSK;  tight on left strap and trapezius muscles which minimally limit range of motion.    Neg spurlings       Assessment & Plan:

## 2012-12-30 NOTE — Assessment & Plan Note (Signed)
Advised stretching exercises- given to her today.  If continues would consider formal physical therapy

## 2012-12-30 NOTE — Assessment & Plan Note (Signed)
Improving Continue current regimen  

## 2012-12-30 NOTE — Assessment & Plan Note (Signed)
Will rx limited course of ambien.  Discussed sleep hygiene.

## 2012-12-30 NOTE — Assessment & Plan Note (Signed)
Patient expressed interest in seeing a therapist to discuss stress of living with chronic disease.  Had been given number to Dr. Pascal Lux in past, I provided her with this again.

## 2013-02-21 ENCOUNTER — Ambulatory Visit: Payer: BC Managed Care – PPO | Admitting: Family Medicine

## 2013-02-25 ENCOUNTER — Ambulatory Visit: Payer: BC Managed Care – PPO | Admitting: Family Medicine

## 2013-04-01 ENCOUNTER — Telehealth: Payer: Self-pay | Admitting: Family Medicine

## 2013-04-01 NOTE — Telephone Encounter (Signed)
The patient has asthmas and has been hospitalized because of it before.  At times she has difficulty affording her meds and the last time she was here Dr. Deirdre Priest said to let him know when she is struggling.  Right now, she is wheezy and she can tell that if she doesn't get [facility], she is going to get into trouble soon and she was hoping that we have some samples.

## 2013-04-01 NOTE — Telephone Encounter (Signed)
LMOVM (identifying) for patient to call [phone] because she needs an appt.  Orlene Salmons, Darlyne Russian, CMA

## 2013-04-02 ENCOUNTER — Encounter: Payer: Self-pay | Admitting: Family Medicine

## 2013-04-02 ENCOUNTER — Ambulatory Visit (INDEPENDENT_AMBULATORY_CARE_PROVIDER_SITE_OTHER): Payer: BC Managed Care – PPO | Admitting: Family Medicine

## 2013-04-02 VITALS — BP 122/77 | HR 74 | Temp 98.6°F | Ht 66.0 in | Wt 176.0 lb

## 2013-04-02 DIAGNOSIS — J4541 Moderate persistent asthma with (acute) exacerbation: Secondary | ICD-10-CM

## 2013-04-02 DIAGNOSIS — J45901 Unspecified asthma with (acute) exacerbation: Secondary | ICD-10-CM

## 2013-04-02 MED ORDER — PREDNISONE 20 MG PO TABS: 20.0000 mg | ORAL_TABLET | Freq: Every day | ORAL | Status: DC

## 2013-04-02 NOTE — Assessment & Plan Note (Signed)
Started a few days ago but worsened past 2 days when she ran out of [place].  She is on Singulair, OTC allergy medications, and albuterol prn which she has been using every 1-2 hours  She is unable to afford the [facility] but does not want to switch to another medication (e.g., QVAR) since she is tired of switching medications. She was informed that we would be able to help her get her medications.  She has been through the process for MAP but has been denied; her financial situation has not changed since then.  -Prednisone burst -Strongly recommended she be on a controller and switch to QVAR if [name] is not affordable -Continue albuterol prn -Continue allergy medication, Singulair -Follow-up in 5 days with PCP and to discuss controller medication; follow-up sooner if breathing not improved

## 2013-04-02 NOTE — Progress Notes (Signed)
  Subjective:    Patient ID: Ginger Organ, female    DOB: December 13, 1960, 52 y.o.   MRN: [id number]  HPI # SDA: wheezing for the past few days Out of [facility] for the past 2 days She felt dyspnea though start prior to running out of [facility]; she has not tried any other controller medications ($80 a month) She is using albuterol every 1-2 hours  Review of Systems Denies fevers, chills, nausea/vomiting, sore throat, decreased hearing  Endorses right ear pain   Allergies, medication, past medical history reviewed.  Smoking status noted.  Social history: Works at [employer]; PCA     Objective:   Physical Exam GEN: appears uncomfortable; audibly wheezing HEENT:   Head: [place]/AT   Eyes: normal conjunctiva without injection or tearing   Ears: TM clear bilaterally with good light reflex and without erythema or air-fluid level; TMJ mildly tender left without rash, swelling, erythema   Nose: no rhinorrhea, normal turbinates   Mouth: MMM; no tonsillar adenopathy; no oropharyngeal erythema NECK: no LAD CV: RRR PULM: supraclavicular retractions; diffuse significant wheezing throughout; fair air movement (last used albuterol 1 hour ago) SKIN: dry     Assessment & Plan:

## 2013-04-02 NOTE — Patient Instructions (Addendum)
Start Prednisone course (for 5 days)  Breathing treatment now  Make an appointment with Dr. Deirdre Priest for 06/16 (Monday)  Come back sooner if your breathing or your ear pain worsens

## 2013-04-03 ENCOUNTER — Telehealth: Payer: Self-pay | Admitting: Family Medicine

## 2013-04-03 NOTE — Telephone Encounter (Signed)
Patient is calling because she is feeling less wheezy today, but still a bit tight and feels that she needs to rest up and would like a note to release her from work [employer].  She will call back with the fax number to send it to.

## 2013-04-03 NOTE — Telephone Encounter (Signed)
The fax # to send the note to is [phone].

## 2013-04-03 NOTE — Telephone Encounter (Signed)
Spoke with patient.  She went to work last night(11p-7a)and has been real tired all day.  She realized she should have stayed home because her job is very physical.  She called in tonight, does not feel she needs to be seen again, just get some rest.  Will fwd to Md to advice whether note or not.  Sarkis Rhines, Darlyne Russian, CMA

## 2013-04-04 ENCOUNTER — Encounter: Payer: Self-pay | Admitting: Family Medicine

## 2013-04-04 NOTE — Telephone Encounter (Signed)
Please notify patient letter work excuse for 06/12 ready for pick up front desk. If she needs more time off, then please advise she follow-up.

## 2013-05-06 ENCOUNTER — Telehealth: Payer: Self-pay | Admitting: Sports Medicine

## 2013-05-06 ENCOUNTER — Ambulatory Visit: Payer: BC Managed Care – PPO | Admitting: Family Medicine

## 2013-05-06 MED ORDER — FLUTICASONE PROPIONATE HFA 110 MCG/ACT IN AERO: 1.0000 | INHALATION_SPRAY | Freq: Two times a day (BID) | RESPIRATORY_TRACT | Status: DC

## 2013-05-06 MED ORDER — SALMETEROL XINAFOATE 50 MCG/DOSE IN AEPB: 1.0000 | INHALATION_SPRAY | Freq: Two times a day (BID) | RESPIRATORY_TRACT | Status: DC

## 2013-05-06 NOTE — Telephone Encounter (Signed)
[facility] 24 Hour After-hours Emergency Line  Patient name: Lydia Griffin  MRN: [id number]  AGE: 52 y.o.  Gender: female DOB: 03-01-1961     Primary Care Provider:   Dessa Phi, MD     Patient called reporting she's been having increased use of her albuterol of the past 2 weeks.  She is finished a prednisone first from a month ago and reports that time has been worsening.  She has been unable to fill her to where the costs.  She reports that she is having a increasing tightness around her chest on a regular basis.  She has used inhaler while at work during third shift just to be able to finish the night.  She is not met with her new PCP Dr. Janice Norrie as and was supposed to followup 5 days after her last visit but was unable to do so.  She does report living patient to check and is unable to afford expensive medications and the [facility] costs over $80 per month.    No conversational dyspnea on the telephone.  Reports she is having dyspnea on exertion.  No chest pain, no diaphoresis currently.  --- DISPOSITION: Pt to follow up with PCP either today or later this week to discuss her ongoing Asthma control.  May benefit from discussing options with Britta Mccreedy for financial counseling as she is previously been given the orange card through [employer].  ?medication assistance program.  Unclear if formulary alternative for [employer]   Andrena Mews, DO [facility] Resident - PGY-2 05/06/2013 6:45 AM

## 2013-05-06 NOTE — Telephone Encounter (Signed)
Called patient. Left vm.  Will send in servent and fluticasone, ? Cheaper than dulera. Unclear from [facility] formulary.  Asked patient to call in  for f/u appt with me.  Also to call if servent and fluticasone are not cheaper.

## 2013-05-06 NOTE — Addendum Note (Signed)
Addended by: Dessa Phi on: 05/06/2013 05:58 PM   Modules accepted: Orders

## 2013-05-08 ENCOUNTER — Encounter: Payer: Self-pay | Admitting: Sports Medicine

## 2013-05-08 ENCOUNTER — Ambulatory Visit (INDEPENDENT_AMBULATORY_CARE_PROVIDER_SITE_OTHER): Payer: BC Managed Care – PPO | Admitting: Sports Medicine

## 2013-05-08 VITALS — BP 122/76 | HR 89 | Temp 98.0°F | Wt 177.6 lb

## 2013-05-08 DIAGNOSIS — Z9114 Patient's other noncompliance with medication regimen: Secondary | ICD-10-CM

## 2013-05-08 DIAGNOSIS — Z9119 Patient's noncompliance with other medical treatment and regimen: Secondary | ICD-10-CM

## 2013-05-08 DIAGNOSIS — J45909 Unspecified asthma, uncomplicated: Secondary | ICD-10-CM

## 2013-05-08 MED ORDER — PREDNISONE 50 MG PO TABS: 50.0000 mg | ORAL_TABLET | Freq: Every day | ORAL | Status: DC

## 2013-05-08 MED ORDER — DEXAMETHASONE SODIUM PHOSPHATE 10 MG/ML IJ SOLN
10.0000 mg | Freq: Once | INTRAMUSCULAR | Status: AC
  Administered 2013-05-08: 10 mg via INTRAMUSCULAR

## 2013-05-08 MED ORDER — ALBUTEROL SULFATE (2.5 MG/3ML) 0.083% IN NEBU
2.5000 mg | INHALATION_SOLUTION | Freq: Once | RESPIRATORY_TRACT | Status: AC
  Administered 2013-05-08: 2.5 mg via RESPIRATORY_TRACT

## 2013-05-08 MED ORDER — AZITHROMYCIN 250 MG PO TABS: ORAL_TABLET | ORAL | Status: DC

## 2013-05-08 MED ORDER — IPRATROPIUM-ALBUTEROL 0.5-2.5 (3) MG/3ML IN SOLN: 3.0000 mL | RESPIRATORY_TRACT | Status: DC | PRN

## 2013-05-08 MED ORDER — IPRATROPIUM BROMIDE 0.02 % IN SOLN
0.5000 mg | Freq: Once | RESPIRATORY_TRACT | Status: AC
  Administered 2013-05-08: 0.5 mg via RESPIRATORY_TRACT

## 2013-05-08 NOTE — Progress Notes (Signed)
  [facility]  Patient name: Lydia Griffin MRN [id number]  Date of birth: 03-24-1961  CC & HPI:  Lydia Griffin is a 52 y.o. female presenting today for:  # Acute RESPIRATORY Symptoms: Major Sxs:  Wheezing, chest tightness  Character  chest tightness, difficulty breathing, mildly productivecough, Long-standing history of asthma, history intubation the past, history of sibling death at early age due to asthma  Onset  greater than one month ago, progressively worsening  Fevers/Chills  no  Rigors  no  N/V  no  Diarrhea  no  Weight Loss  no  Sick Contacts  no  Other  difficulty obtaining medications due to co-pay costs.  Is not really on a control her medications. Has been taking Singulair and albuterol only.  Therapy Tried  sealer, albuterol, not taking antihistamine or other controller medicines     ROS:  PER HPI  Pertinent History Reviewed:  Medical & Surgical Hx:  Reviewed: Significant for ASTHMA poorly controlled Medications: Reviewed & Updated - see associated section Social History: Reviewed -  reports that she has never smoked. She has never used smokeless tobacco.  Objective Findings:  Vitals: BP 122/76  Pulse 89  Temp(Src) 98 F (36.7 C) (Oral)  Wt 177 lb 9.6 oz (80.559 kg)  BMI 28.68 kg/m2 Spo2 = 91% at rest - 94% after duoneb  PE: GENERAL: Adult AA  female. In no discomfort; audible wheezing during conversation, minimal conversational dyspnea  PSYCH:  alert and appropriate, good insight   HNEENT:  H&N: AT/[place], trachea midline  Eyes: no scleral icterus, no conjunctival exudate  Ears: B serous effusions  Nose: Dry mucous membranes, pallor, minimal exudate, no polyps  Oropharynx: MMM  Dentention:     CARDIO:  RRR, S1/S2 heard, no murmur  LUNGS:  Significant expiratory wheezing.  In no respiratory distress, Nasonex is red muscles.  No focal consolidations.  ABDOMEN:    EXTREM:   GU:   SKIN:   NEUROMSK:      Assessment & Plan:

## 2013-05-08 NOTE — Patient Instructions (Addendum)
It was nice to see you today.   Today we discussed: 1. ASTHMA, PERSISTENT I have prescribed: - predniSONE (DELTASONE) 50 MG tablet; Take 1 tablet (50 mg total) by mouth daily.  Dispense: 5 tablet; Refill: 0 - dexamethasone (DECADRON) injection 10 mg; Inject 1 mL (10 mg total) into the muscle once. - GIVEN HERE - azithromycin (ZITHROMAX) 250 MG tablet; Take 2 tablets today, then daily for 4 additional days  Dispense: 6 tablet; Refill: 0 - ipratropium-albuterol (DUONEB) 0.5-2.5 (3) MG/3ML SOLN; Take 3 mLs by nebulization every 4 (four) hours as needed.  Dispense: 360 mL; Refill: 2  2. Medication affordability  - Please See Lydia Griffin in the front office to discuss/apply for orange card    Please plan to return to see Dr. Armen Griffin ASAP.   If you need anything prior to seeing me please call the clinic.  Please Bring all medications with you to each appointment.

## 2013-05-08 NOTE — Assessment & Plan Note (Signed)
Dulera > 80$ Flovent > 70 Salmeterol > 70  I recommended that she follow up with Britta Mccreedy to the apply for the orange card and to inquire as to whether or not she will qualify for the [place] Program.  She has recently income to out of Medicaid however is living pay check to paycheck and cannot afford at this time

## 2013-05-08 NOTE — Assessment & Plan Note (Addendum)
Given significant findings today will treat with: course of azithromycin for questionable bronchitic involvement Decadron here followed by 5 daysteroid burst Provide do another as tear 1 on her plan. Restart Claritin Current regimen will include: Claritin, Singulair, albuterol when necessary, duo-nebs when necessary, 5 day steroid burst. DuoNeb in office and recheck SpO2 Defer X-ray as known asthmatic, no fevers, minimally productive cough, txing with Azithro >  Patient needs controller medications however she is unable to afford these.

## 2013-06-04 ENCOUNTER — Ambulatory Visit: Payer: BC Managed Care – PPO | Admitting: Family Medicine

## 2013-06-04 ENCOUNTER — Encounter: Payer: Self-pay | Admitting: Family Medicine

## 2013-06-04 ENCOUNTER — Ambulatory Visit (INDEPENDENT_AMBULATORY_CARE_PROVIDER_SITE_OTHER): Payer: Self-pay | Admitting: Family Medicine

## 2013-06-04 VITALS — BP 111/77 | HR 81 | Temp 98.1°F | Ht 66.0 in | Wt 177.0 lb

## 2013-06-04 DIAGNOSIS — J45909 Unspecified asthma, uncomplicated: Secondary | ICD-10-CM

## 2013-06-04 DIAGNOSIS — J45901 Unspecified asthma with (acute) exacerbation: Secondary | ICD-10-CM

## 2013-06-04 MED ORDER — PREDNISONE 50 MG PO TABS: ORAL_TABLET | ORAL | Status: DC

## 2013-06-04 MED ORDER — ALBUTEROL SULFATE (2.5 MG/3ML) 0.083% IN NEBU
2.5000 mg | INHALATION_SOLUTION | Freq: Once | RESPIRATORY_TRACT | Status: AC
  Administered 2013-06-04: 2.5 mg via RESPIRATORY_TRACT

## 2013-06-04 MED ORDER — IPRATROPIUM BROMIDE 0.02 % IN SOLN
0.5000 mg | Freq: Once | RESPIRATORY_TRACT | Status: AC
  Administered 2013-06-04: 0.5 mg via RESPIRATORY_TRACT

## 2013-06-04 MED ORDER — DEXAMETHASONE SODIUM PHOSPHATE 10 MG/ML IJ SOLN
10.0000 mg | Freq: Once | INTRAMUSCULAR | Status: AC
  Administered 2013-06-04: 10 mg via INTRAMUSCULAR

## 2013-06-04 NOTE — Assessment & Plan Note (Signed)
Longer taper oral steroids. See patient's instructions

## 2013-06-04 NOTE — Progress Notes (Signed)
  Subjective:    Patient ID: Lydia Griffin, female    DOB: 18-Sep-1961, 52 y.o.   MRN: [id number]  HPI  Increased wheezing and shortness of breath. Was treated about 2 weeks ago for asthma exacerbation with 5 days of steroid. Never completely cleared. Has had intermittent increased wheezing since then. This morning she was getting off work, she was uncomfortably short of breath. She works third shift. No fever. No cough.  Review of Systems No unusual weight change. See history of present illness.    Objective:   Physical Exam Vital signs are reviewed GENERAL: Well-developed female no acute distress LUNGS: Very rhonchorous expiratory wheezes bilaterally. After nebulizer treatment every been increased but she continued to have significant amount of wheezing.       Assessment & Plan:  Asthma exacerbation Gave her IM steroid in clinic. We'll start by mouth steroids. See patient instructions.

## 2013-06-04 NOTE — Patient Instructions (Addendum)
I am giving you some steroids by injection. I am also restarting your oral steroids. You'll take one pill a day for 8 days then one half pill a day for 4 more days. I'm not going to repeat your antibiotics at this  Time.   If you are not having significant improvement in the next 3-4 days, you need to call me because then I would want a chest x-ray and I probably would want to restart antibiotics. If you get worse, either go immediately to the ED or give [place] a call. If this does not totally resolve within 2 weeks, we need to see you back.

## 2013-06-06 ENCOUNTER — Telehealth: Payer: Self-pay | Admitting: Family Medicine

## 2013-06-06 NOTE — Telephone Encounter (Signed)
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 She went to work last night and Wednesday night after office visit and is only mildly better from office visit.   She is taking oral prednisone, using albuterol inhaler. Still SOB.   Phon assessment: Able to give me history without SOB or interruption. occassionally coughed.

## 2013-06-06 NOTE — Telephone Encounter (Signed)
It is related to asthma and exacerbation 2 days ago - I will forward to Dr. Jennette Kettle . Thanks! Wyatt Haste, RN-BSN

## 2013-06-06 NOTE — Telephone Encounter (Signed)
Paged and spoke to Dr. Jennette Kettle who agrees with writing patient out of work. Letter written. Given to [employer] to fax.  Patient called.

## 2013-06-06 NOTE — Telephone Encounter (Signed)
Pt states she does need a note to be out of work from Aug 15 thru Aug 17. Please fax note to Francena Hanly at [phone]

## 2013-06-06 NOTE — Telephone Encounter (Signed)
Why does she need to be out of work? Asthma?? Need more info.  Patient evaluated by Dr. Jennette Kettle 2 days ago for asthma exacerbation.   Please call patient. I cannot write the note w/o seeing her. Perhaps Dr. Jennette Kettle will write it if she agrees that out of work is medically necessary.

## 2013-06-06 NOTE — Telephone Encounter (Signed)
Called pt employer for verification - they did receive Wyatt Haste, RN-BSN

## 2013-06-06 NOTE — Telephone Encounter (Signed)
Pt called. Her employer is requiring her to be at work at 4 today unless there is a note received. Please advise

## 2013-06-06 NOTE — Telephone Encounter (Signed)
Called patient to verify fax number because the initial fax did not go through.   Patient said fax # was correct, [phone].

## 2013-06-06 NOTE — Telephone Encounter (Signed)
Please advise and I will be happy to write. Wyatt Haste, RN-BSN

## 2013-06-26 ENCOUNTER — Emergency Department (HOSPITAL_COMMUNITY): Payer: 59

## 2013-06-26 ENCOUNTER — Telehealth: Payer: Self-pay | Admitting: *Deleted

## 2013-06-26 ENCOUNTER — Inpatient Hospital Stay (HOSPITAL_COMMUNITY)
Admission: EM | Admit: 2013-06-26 | Discharge: 2013-06-29 | DRG: 203 | Disposition: A | Discharge status: Home / Self Care | Payer: 59 | Attending: Family Medicine | Admitting: Family Medicine

## 2013-06-26 ENCOUNTER — Encounter (HOSPITAL_COMMUNITY): Payer: Self-pay | Admitting: Emergency Medicine

## 2013-06-26 DIAGNOSIS — Z9119 Patient's noncompliance with other medical treatment and regimen: Secondary | ICD-10-CM

## 2013-06-26 DIAGNOSIS — G4700 Insomnia, unspecified: Secondary | ICD-10-CM | POA: Diagnosis present

## 2013-06-26 DIAGNOSIS — Z9114 Patient's other noncompliance with medication regimen: Secondary | ICD-10-CM

## 2013-06-26 DIAGNOSIS — J45901 Unspecified asthma with (acute) exacerbation: Principal | ICD-10-CM | POA: Diagnosis present

## 2013-06-26 DIAGNOSIS — Z91148 Patient's other noncompliance with medication regimen for other reason: Secondary | ICD-10-CM

## 2013-06-26 DIAGNOSIS — M545 Low back pain, unspecified: Secondary | ICD-10-CM | POA: Diagnosis present

## 2013-06-26 DIAGNOSIS — N760 Acute vaginitis: Secondary | ICD-10-CM

## 2013-06-26 DIAGNOSIS — G8929 Other chronic pain: Secondary | ICD-10-CM

## 2013-06-26 DIAGNOSIS — J45902 Unspecified asthma with status asthmaticus: Secondary | ICD-10-CM

## 2013-06-26 DIAGNOSIS — M549 Dorsalgia, unspecified: Secondary | ICD-10-CM

## 2013-06-26 DIAGNOSIS — Z91199 Patient's noncompliance with other medical treatment and regimen due to unspecified reason: Secondary | ICD-10-CM

## 2013-06-26 LAB — BASIC METABOLIC PANEL
CO2: 24 mEq/L (ref 19–32)
Chloride: 103 mEq/L (ref 96–112)
Glucose, Bld: 101 mg/dL — ABNORMAL HIGH (ref 70–99)
Potassium: 4.1 mEq/L (ref 3.5–5.1)
Sodium: 138 mEq/L (ref 135–145)

## 2013-06-26 LAB — CBC WITH DIFFERENTIAL/PLATELET
Eosinophils Absolute: 0.3 10*3/uL (ref 0.0–0.7)
Eosinophils Relative: 4 % (ref 0–5)
Hemoglobin: 14.7 g/dL (ref 12.0–15.0)
Lymphocytes Relative: 41 % (ref 12–46)
Lymphs Abs: 3.3 10*3/uL (ref 0.7–4.0)
MCH: 28.7 pg (ref 26.0–34.0)
MCV: 82.6 fL (ref 78.0–100.0)
Monocytes Relative: 11 % (ref 3–12)
Neutrophils Relative %: 44 % (ref 43–77)
RBC: 5.12 MIL/uL — ABNORMAL HIGH (ref 3.87–5.11)

## 2013-06-26 LAB — CBC
Hemoglobin: 14.6 g/dL (ref 12.0–15.0)
MCHC: 35.3 g/dL (ref 30.0–36.0)
Platelets: 274 10*3/uL (ref 150–400)
RDW: 14.8 % (ref 11.5–15.5)

## 2013-06-26 LAB — CREATININE, SERUM
Creatinine, Ser: 0.81 mg/dL (ref 0.50–1.10)
GFR calc non Af Amer: 81 mL/min — ABNORMAL LOW (ref >90)

## 2013-06-26 MED ORDER — ACETAMINOPHEN 325 MG PO TABS
650.0000 mg | ORAL_TABLET | Freq: Four times a day (QID) | ORAL | Status: DC | PRN
Start: 2013-06-26 — End: 2013-06-29
  Administered 2013-06-26 – 2013-06-27 (×2): 650 mg via ORAL
  Filled 2013-06-26 (×2): qty 2

## 2013-06-26 MED ORDER — ALBUTEROL (5 MG/ML) CONTINUOUS INHALATION SOLN: 15.0000 mg/h | INHALATION_SOLUTION | Freq: Once | RESPIRATORY_TRACT | Status: DC

## 2013-06-26 MED ORDER — POLYETHYLENE GLYCOL 3350 17 G PO PACK
17.0000 g | PACK | Freq: Every day | ORAL | Status: DC | PRN
  Filled 2013-06-26: qty 1

## 2013-06-26 MED ORDER — ONDANSETRON HCL 4 MG/2ML IJ SOLN: 4.0000 mg | Freq: Four times a day (QID) | INTRAMUSCULAR | Status: DC | PRN

## 2013-06-26 MED ORDER — ALBUTEROL SULFATE (5 MG/ML) 0.5% IN NEBU
2.5000 mg | INHALATION_SOLUTION | Freq: Four times a day (QID) | RESPIRATORY_TRACT | Status: DC
  Administered 2013-06-27 – 2013-06-28 (×8): 2.5 mg via RESPIRATORY_TRACT
  Filled 2013-06-26 (×8): qty 0.5

## 2013-06-26 MED ORDER — ALBUTEROL SULFATE (5 MG/ML) 0.5% IN NEBU: 2.5000 mg | INHALATION_SOLUTION | RESPIRATORY_TRACT | Status: DC

## 2013-06-26 MED ORDER — CYCLOBENZAPRINE HCL 10 MG PO TABS: 10.0000 mg | ORAL_TABLET | Freq: Two times a day (BID) | ORAL | Status: DC | PRN

## 2013-06-26 MED ORDER — METHYLPREDNISOLONE SODIUM SUCC 125 MG IJ SOLR
125.0000 mg | Freq: Four times a day (QID) | INTRAMUSCULAR | Status: DC
  Administered 2013-06-26: 125 mg via INTRAVENOUS
  Filled 2013-06-26: qty 2

## 2013-06-26 MED ORDER — CYCLOBENZAPRINE HCL 5 MG PO TABS
5.0000 mg | ORAL_TABLET | Freq: Once | ORAL | Status: AC
  Administered 2013-06-26: 5 mg via ORAL
  Filled 2013-06-26: qty 1

## 2013-06-26 MED ORDER — ONDANSETRON HCL 4 MG PO TABS: 4.0000 mg | ORAL_TABLET | Freq: Four times a day (QID) | ORAL | Status: DC | PRN

## 2013-06-26 MED ORDER — IPRATROPIUM BROMIDE 0.02 % IN SOLN
0.5000 mg | Freq: Four times a day (QID) | RESPIRATORY_TRACT | Status: DC
  Administered 2013-06-27 – 2013-06-28 (×8): 0.5 mg via RESPIRATORY_TRACT
  Filled 2013-06-26 (×8): qty 2.5

## 2013-06-26 MED ORDER — ZOLPIDEM TARTRATE 5 MG PO TABS: 5.0000 mg | ORAL_TABLET | Freq: Every evening | ORAL | Status: DC | PRN

## 2013-06-26 MED ORDER — ACETAMINOPHEN 650 MG RE SUPP: 650.0000 mg | Freq: Four times a day (QID) | RECTAL | Status: DC | PRN

## 2013-06-26 MED ORDER — ALBUTEROL SULFATE (5 MG/ML) 0.5% IN NEBU
INHALATION_SOLUTION | RESPIRATORY_TRACT | Status: AC
  Administered 2013-06-26: 5 mg
  Filled 2013-06-26: qty 1

## 2013-06-26 MED ORDER — IPRATROPIUM BROMIDE 0.02 % IN SOLN
0.5000 mg | RESPIRATORY_TRACT | Status: DC
  Administered 2013-06-26 (×2): 0.5 mg via RESPIRATORY_TRACT
  Filled 2013-06-26 (×2): qty 2.5

## 2013-06-26 MED ORDER — TRAMADOL HCL 50 MG PO TABS
50.0000 mg | ORAL_TABLET | Freq: Four times a day (QID) | ORAL | Status: DC
  Administered 2013-06-26 – 2013-06-29 (×11): 50 mg via ORAL
  Filled 2013-06-26 (×11): qty 1

## 2013-06-26 MED ORDER — PREDNISONE 50 MG PO TABS
50.0000 mg | ORAL_TABLET | Freq: Every day | ORAL | Status: DC
  Administered 2013-06-27 – 2013-06-29 (×3): 50 mg via ORAL
  Filled 2013-06-26 (×4): qty 1

## 2013-06-26 MED ORDER — CYCLOBENZAPRINE HCL 10 MG PO TABS
5.0000 mg | ORAL_TABLET | Freq: Two times a day (BID) | ORAL | Status: DC | PRN
  Administered 2013-06-26: 5 mg via ORAL
  Filled 2013-06-26: qty 1

## 2013-06-26 MED ORDER — ALBUTEROL (5 MG/ML) CONTINUOUS INHALATION SOLN
INHALATION_SOLUTION | RESPIRATORY_TRACT | Status: AC
  Filled 2013-06-26: qty 20

## 2013-06-26 MED ORDER — IPRATROPIUM BROMIDE 0.02 % IN SOLN: 0.5000 mg | RESPIRATORY_TRACT | Status: DC

## 2013-06-26 MED ORDER — ALBUTEROL SULFATE (5 MG/ML) 0.5% IN NEBU: 2.5000 mg | INHALATION_SOLUTION | RESPIRATORY_TRACT | Status: DC | PRN

## 2013-06-26 MED ORDER — PREDNISONE 20 MG PO TABS: 50.0000 mg | ORAL_TABLET | Freq: Once | ORAL | Status: DC

## 2013-06-26 MED ORDER — ALBUTEROL SULFATE (5 MG/ML) 0.5% IN NEBU
2.5000 mg | INHALATION_SOLUTION | RESPIRATORY_TRACT | Status: DC
  Administered 2013-06-26 (×2): 2.5 mg via RESPIRATORY_TRACT
  Filled 2013-06-26 (×2): qty 0.5

## 2013-06-26 MED ORDER — SODIUM CHLORIDE 0.9 % IV SOLN
INTRAVENOUS | Status: DC
  Administered 2013-06-26: 22:00:00 via INTRAVENOUS

## 2013-06-26 MED ORDER — ALBUTEROL (5 MG/ML) CONTINUOUS INHALATION SOLN
15.0000 mg/h | INHALATION_SOLUTION | RESPIRATORY_TRACT | Status: DC
  Administered 2013-06-26: 15 mg/h via RESPIRATORY_TRACT

## 2013-06-26 MED ORDER — HEPARIN SODIUM (PORCINE) 5000 UNIT/ML IJ SOLN
5000.0000 [IU] | Freq: Three times a day (TID) | INTRAMUSCULAR | Status: DC
  Administered 2013-06-26 – 2013-06-29 (×10): 5000 [IU] via SUBCUTANEOUS
  Filled 2013-06-26 (×15): qty 1

## 2013-06-26 NOTE — ED Provider Notes (Signed)
CSN: [id number]     Arrival date & time 06/26/13  1336 History     Chief Complaint  Patient presents with  . Asthma   HPI Comments: Pt is a 52 y/o female with PMHx of asthma who has had multiple admissions for asthma exacerbations in the past who presents today with acute shortness of breath.  Pt states she has been out of her controller medication [facility]) for over a month now and has required two trips to her PCP office for short bursts of steroids.  Pt states that she has been using her albuterol around the clock to help control her Sx, and has used 42 puffs over the last 24 hrs.  She went to work today and had progressive shortness of breath to the point she was transported to the ED for further evaluation.  She received 5 neb tx in the waiting room with minimal improvement.  She has never been placed in the ICU due to her asthma and has been intubated once as a child.  She denies fever, chills, productive cough, chest pain, dysuria.   Patient is a 52 y.o. female presenting with asthma.  Asthma Pertinent negatives include no chest pain, chills, congestion, coughing, diaphoresis, fever, headaches, numbness or sore throat.    Past Medical History  Diagnosis Date  . Asthma     Started as a child; Has been intubated for asthma exacerbation in approx 1990  . Shortness of breath     "related to asthma attacks" (09/19/2012)   Past Surgical History  Procedure Laterality Date  . Hand      flexor tendon repair  . Tubal ligation  ~ 1985   Family History  Problem Relation Age of Onset  . Asthma Mother   . Heart disease Father 57    MI  . Alcohol abuse Father   . Asthma Sister 9    death from asthma   History  Substance Use Topics  . Smoking status: Never Smoker   . Smokeless tobacco: Never Used  . Alcohol Use: No   OB History   Grav Para Term Preterm Abortions TAB SAB Ect Mult Living                 Review of Systems  Constitutional: Positive for activity change. Negative for  fever, chills and diaphoresis.  HENT: Negative for congestion, sore throat, rhinorrhea, sneezing and postnasal drip.   Eyes: Negative.   Respiratory: Positive for chest tightness, shortness of breath and wheezing. Negative for cough.   Cardiovascular: Negative for chest pain, palpitations and leg swelling.  Gastrointestinal: Negative.   Endocrine: Negative.   Musculoskeletal: Negative.   Skin: Negative.   Allergic/Immunologic: Negative.   Neurological: Negative.  Negative for dizziness, syncope, light-headedness, numbness and headaches.  Hematological: Negative.   Psychiatric/Behavioral: Negative.     Allergies  Review of patient's allergies indicates no known allergies.  Home Medications   Current Outpatient Rx  Name  Route  Sig  Dispense  Refill  . albuterol (PROVENTIL HFA;VENTOLIN HFA) 108 (90 BASE) MCG/ACT inhaler   Inhalation   Inhale 2 puffs into the lungs every 4 (four) hours as needed. For shortness of breath/wheezing         . albuterol (PROVENTIL) (5 MG/ML) 0.5% nebulizer solution   Nebulization   Take 5 mg by nebulization every 6 (six) hours as needed. For shortness of breath/wheezing         . azithromycin (ZITHROMAX) 250 MG tablet  Take 2 tablets today, then daily for 4 additional days   6 tablet   0   . Cyanocobalamin (VITAMIN B 12 PO)   Sublingual   Place 1 tablet under the tongue daily.         [name] Kitchen ipratropium-albuterol (DUONEB) 0.5-2.5 (3) MG/3ML SOLN   Nebulization   Take 3 mLs by nebulization every 4 (four) hours as needed.   360 mL   2   . loratadine (CLARITIN) 10 MG tablet   Oral   Take 10 mg by mouth daily.          . montelukast (SINGULAIR) 10 MG tablet   Oral   Take 1 tablet (10 mg total) by mouth at bedtime.   30 tablet   11   . Multiple Vitamin (MULITIVITAMIN WITH MINERALS) TABS   Oral   Take 1 tablet by mouth daily.         . predniSONE (DELTASONE) 50 MG tablet      Take one by mouth daily for eight days and then 1/2  tab by [facility] for 4 days   10 tablet   1    BP 132/82  Pulse 102  Temp(Src) 98.9 F (37.2 C) (Oral)  Resp 28  Ht 5\' 6"  (1.676 m)  Wt 176 lb (79.833 kg)  BMI 28.42 kg/m2  SpO2 89% Physical Exam  Constitutional: She is oriented to person, place, and time. She appears well-developed and well-nourished. She appears distressed.  HENT:  Head: Normocephalic and atraumatic.  Mouth/Throat: No oropharyngeal exudate.  Eyes: Conjunctivae and EOM are normal.  Neck: Trachea normal and normal range of motion. Neck supple. No JVD present. No tracheal deviation present.  Cardiovascular: Regular rhythm, normal heart sounds and intact distal pulses.  Tachycardia present.   No murmur heard. Pulmonary/Chest: Accessory muscle usage present. No stridor. Tachypnea noted. She is in respiratory distress. She has decreased breath sounds. She has wheezes (Upper Lung Fields > middle/lower). She has no rales. She exhibits retraction. She exhibits no tenderness.  Abdominal: Normal appearance and bowel sounds are normal. There is no tenderness.  Musculoskeletal: Normal range of motion.  Lymphadenopathy:    She has no cervical adenopathy.  Neurological: She is alert and oriented to person, place, and time. No cranial nerve deficit.  Skin: Skin is warm, dry and intact. She is not diaphoretic.  Psychiatric: She has a normal mood and affect. Her behavior is normal.    ED Course  Procedures (including critical care time) Labs Review Labs Reviewed  CBC WITH DIFFERENTIAL - Abnormal; Notable for the following:    RBC 5.12 (*)    All other components within normal limits  BASIC METABOLIC PANEL   Imaging Review Dg Chest Port 1 View  06/26/2013   *RADIOLOGY REPORT*  Clinical Data: Asthma exacerbation  PORTABLE CHEST - 1 VIEW  Comparison: December 16, 2012  Findings: Lungs are mildly hyperexpanded. There is no edema or consolidation.  There is mild central peribronchial thickening. Heart size and pulmonary vascularity  are normal.  No pneumothorax. No adenopathy.  No bone lesions.  IMPRESSION: Lungs are mildly hyperexpanded with central peribronchial thickening.  These are stable findings consistent with asthma.  No edema or consolidation.   Original Report Authenticated By: Bretta Bang, M.D.    MDM   1. Status asthmaticus   Pt well known to FM and has been hospitalized for asthma exacerbations three times within the past year due to non adherence to her controller inhaled steroid secondary to cost.  She has received 5 albuterol breathing treatments prior to being seen with minimal improvement and at this time we will start on CAT @ 15mg /hr.  If does not have much improvement within 30-45 minutes, will give 2 mg Mag IV.  As well will consider Duoneb x 1 if the CAT significantly helps her.  CXR, BMET, CBC to evaluate for other processes and will place on telemetry with O2 sats to keep O2 > 92%.   1505: Pt responded well to 1 hr CAT, will give another hr and reassess.  Discussed case with Dr. Waynetta Sandy, [facility] resident, who will admit pt.   [name] Paulina Fusi, DO of [facility] 06/26/2013, 3:28 PM      Briscoe Deutscher, DO 06/26/13 1528

## 2013-06-26 NOTE — ED Provider Notes (Signed)
I saw and evaluated the patient, reviewed the resident's note and I agree with the findings and plan.   Dagmar Hait, MD 06/26/13 [phone]

## 2013-06-26 NOTE — H&P (Signed)
Family Medicine Teaching [facility] Admission History and Physical Service Pager: [phone]  Patient name: Lydia Griffin Medical record number: [id number] Date of birth: [birth year] Age: 52 y.o. Gender: female  Primary Care Provider: Lora Paula, MD Consultants: none Code Status: full code  Chief Complaint: asthma exacerbation  Assessment and Plan: Lydia Griffin is a 52 y.o. year old female presenting with an asthma exacerbation . PMH is significant asthma requiring intubation x2 in her past and medication noncompliance. Patient was admitted to stepdown under FPTS for moderate to severe asthma excerbation  #asthma - severe wheezes on exam, increased O2 requirement, has been on continuous albuterol x2 and 10L O2 non-re-beather mask.  -received 1 dose of Solu-medrol in ED; continued steroid treatment with prednisone 50 mg QD starting tomorrow -continuous O2 monitoring; O2 via NRB to keep O2 above 92% -Q2 (dueoneb) q1h PRN albuterol nebs  #back pain - reports severe low back pain, consistent with her chronic pain which gets worse when she has been breathing heavily. Likely musculoskeletal fatigue of accessory muscle use.  -Tramadol 50mg  q6h PRN -Flexeril 5mg  BID PRN muscle spasms  #history of medication compliance: Patient has a history of not being able to afford her medications, mostly her asthma controller medication (Dulera). -case management consult for medication assisstance  FEN/GI: regular diet, miralax PRN Prophylaxis: heparin subQ  Disposition: admit to stepdown (due to O2 requirements and need for frequent albuterol nebs); Dispo pending respiratory improvement  History of Present Illness: Lydia Griffin is a 52 y.o. year old female presenting with an asthma exacerbation. She reports that starting about 2-3 days ago she started feeling like she was having trouble get enough air.   The symptoms have gradually been worsening over the past days, today she called her  PCP and was advised to come to the ED.   She has been using her prescribed albuterol inhalers and nebulized albuterol with minimal temporary relief (x4).  She has not been taking her prescribed Dulera due to cost concerns.   Additionally she reports 7/10 low back pain, typical of her chronic back pain which worsens with her asthma as well as ribcage pain below her breasts which she believe is due to heavy breathing.   She works 3rd shift and deals with chronic insomnia for about 4 months and does not take her prescribed sleep medication out of fear of addiction.  She reports being awake for about the last 48 hours.   She also believes that some component of her shortness of breath is related to her "nerves" and reports dealing with a lot of stress from her job and her grandchildren.   Denies any recent illness or issues with allergies.  No sick contacts.  She has suffered with severe asthma for most of her life and trouble filling her prescriptions due to cost.  After her most recent admission an attempt was made to ensure she would be able to fill her [facility] prescription but she was not able to.   Additionally, she reports 2 admission in 20's when she required intubation, but no intubations since then.  Review Of Systems: Per HPI.  Otherwise 12 point review of systems was performed and was unremarkable.  Patient Active Problem List   Diagnosis Date Noted  . Insomnia 12/30/2012  . Neck strain 12/30/2012  . Noncompliance with medication treatment due to underuse of medication 12/16/2012  . Asthma exacerbation 08/15/2011  . Palpitation 12/25/2010  . FATIGUE 08/09/2010  . ASTHMA, PERSISTENT 11/02/2009  .  Overweight 12/20/2006  . 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 Asthma     Started as a child; Has been intubated for asthma exacerbation in approx 1990  . Shortness of breath     "related to asthma attacks" (09/19/2012)   Past  Surgical History: Past Surgical History  Procedure Laterality Date  . Hand      flexor tendon repair  . Tubal ligation  ~ 1985   Social History: History  Substance Use Topics  . Smoking status: Never Smoker   . Smokeless tobacco: Never Used  . Alcohol Use: No   Additional social history: works third shift; unable to afford medications Please also refer to relevant sections of EMR.  Family History: Family History  Problem Relation Age of Onset  . Asthma Mother   . Heart disease Father 46    MI  . Alcohol abuse Father   . Asthma Sister 9    death from asthma   Allergies and Medications: No Known Allergies No current facility-administered medications on file prior to encounter.   Current Outpatient Prescriptions on File Prior to Encounter  Medication Sig Dispense Refill  . albuterol (PROVENTIL HFA;VENTOLIN HFA) 108 (90 BASE) MCG/ACT inhaler Inhale 2 puffs into the lungs every 4 (four) hours as needed. For shortness of breath/wheezing      . albuterol (PROVENTIL) (5 MG/ML) 0.5% nebulizer solution Take 5 mg by nebulization every 6 (six) hours as needed. For shortness of breath/wheezing      . Cyanocobalamin (VITAMIN B 12 PO) Place 1 tablet under the tongue daily.      [name] Kitchen ipratropium-albuterol (DUONEB) 0.5-2.5 (3) MG/3ML SOLN Take 3 mLs by nebulization every 4 (four) hours as needed.  360 mL  2  . loratadine (CLARITIN) 10 MG tablet Take 10 mg by mouth daily.       . montelukast (SINGULAIR) 10 MG tablet Take 1 tablet (10 mg total) by mouth at bedtime.  30 tablet  11  . Multiple Vitamin (MULITIVITAMIN WITH MINERALS) TABS Take 1 tablet by mouth daily.        Objective: BP 132/82  Pulse 104  Temp(Src) 98.9 F (37.2 C) (Oral)  Resp 18  Ht 5\' 6"  (1.676 m)  Wt 176 lb (79.833 kg)  BMI 28.42 kg/m2  SpO2 100%  Exam: General appearance - in moderate distress, appears tired and short of breath, able to talk in sentences (with difficulty) Mental status - alert, oriented to person,  place, and time Eyes - pupils equal and reactive, extraocular eye movements intact Neck - supple, no significant adenopathy Chest - on continuous albuterol, with bilateral severe wheezes, increased work of breathing and rhonchi throughout lung fields Heart - Tachycardic (albuterol induced), regular rhythm, normal S1, S2, no murmurs, rubs, clicks or gallops Abdomen - soft, nontender, nondistended, no masses or organomegaly Back exam - pain with motion noted during exam, tenderness over the low back Extremities - peripheral pulses normal, no erythema, no pedal edema, no clubbing or cyanosis Skin - normal coloration and turgor, no rashes, no suspicious skin lesions noted  Labs and Imaging: CBC BMET   Recent Labs Lab 06/26/13 1401  WBC 8.1  HGB 14.7  HCT 42.3  PLT 279    Recent Labs Lab 06/26/13 1401  NA 138  K 4.1  CL 103  CO2 24  BUN 11  CREATININE 0.88  GLUCOSE 101*  CALCIUM 9.4     Shon Millet, Med Student  06/26/2013, 3:58 PM [facility] FPTS Intern pager: [phone], text pages welcome  I have seen, examined and discussed the care of Mrs. Gannett with medical student and the FPTS team. I have made alterations to the assessment and plan in blue.  S: In short, Mrs. Floren has not been compliant with medications due to not being able to afford them. She attempted to give herself treatments today (x4). She was still with increased work of breath and was encouraged to come to the ED. She needed CAT x2 in the ED and 10L O2 non-re-breather mask. She was admitted to the [facility] unit for continuous O2 monitoring and Q2Q1PRN breathing treatments. She was started on methyprednisone x1 dose and then transitioned to prednisone for tomorrow.  O: BP 111/59  Pulse 105  Temp(Src) 98.9 F (37.2 C) (Oral)  Resp 21  Ht 5\' 6"  (1.676 m)  Wt 176 lb (79.833 kg)  BMI 28.42 kg/m2  SpO2 100% General appearance - in moderate distress, appears tired and short of breath, able to talk  in sentences (with difficulty) Mental status - alert, oriented to person, place, and time Eyes - pupils equal and reactive, extraocular eye movements intact Neck - supple, no significant adenopathy Chest - on continuous albuterol, with bilateral severe wheezes, increased work of breathing and rhonchi throughout lung fields Heart - Tachycardic (albuterol induced), regular rhythm, normal S1, S2, no murmurs, rubs, clicks or gallops Abdomen - soft, nontender, nondistended, no masses or organomegaly Back exam - pain with motion noted during exam, tenderness over the low back Extremities - peripheral pulses normal, no erythema, no pedal edema, no clubbing or cyanosis Skin - normal coloration and turgor, no rashes, no suspicious skin lesions noted  Felix Pacini DO FPTS PGY-2

## 2013-06-26 NOTE — H&P (Signed)
FMTS Attending Admit Note Patient seen and examined by me, discussed with resident team and I agree with assessment and plan as documented.  At the time of my exam, patient is speaking fluidly and with pulse oximetry 94% on 2L/min supplemental oxygen by nasal canula.  Her lung exam is notable for diffuse wheezes throughout both lung fields. Given this apparent improvement from her initial presentation in ED, I believe we may discontinue the order for ABG unless patient decompensates.  Continue albuterol nebs and transition to oral prednisone in AM.  Supplemental oxygen as needed.  To watch in step-down unit overnight, with plan to transfer to floor in morning if continued improvement as anticipated. Paula Compton, MD

## 2013-06-26 NOTE — Telephone Encounter (Signed)
Patient left a message on the physician line saying she is having an asthma attack. I returned her call and she was having a lot of trouble breathing, and having muscle spasms. I told her to go ahead and go directly to UC or the ER. Patient agreed.Lydia Griffin, Rodena Medin

## 2013-06-26 NOTE — Progress Notes (Signed)
PGY-2 Interim Note  Pt seen at bedside. Currently on 2L [place] feeling like her breathing is improved, but complaining of diffuse muscle cramping in her back and chest especially, muscles sore from coughing and working hard to breathe. Otherwise states she is feeling "50% better." On exam, lung fields have diffuse inspiratory and expiratory wheezes and breath sounds are tight/diminished bilaterally, but equal and sound improved compared to documentation of previous exams.  Pt received Flexeril 5 mg earlier with some relief. Potassium WNL, and soreness is likely due to cough/etc, as above. Will start gentle fluids to supplement PO intake as pt has had poor PO for the last few days. Will continue breathing treatments and supplemental O2 as needed, per previous notes. Will monitor overnight in SDU; hopeful for transfer to regular floor tomorrow, pending improvement.  Bobbye Morton, MD PGY-2, [facility] 06/26/2013, 9:51 PM FPTS Service pager: [phone] (text pages welcome through [facility])

## 2013-06-26 NOTE — ED Notes (Addendum)
Hx asthma; no allergies; breathing tx at home today x 4; audibly wheezing ; tripoding at times.

## 2013-06-27 DIAGNOSIS — J45902 Unspecified asthma with status asthmaticus: Secondary | ICD-10-CM

## 2013-06-27 MED ORDER — MOMETASONE FURO-FORMOTEROL FUM 200-5 MCG/ACT IN AERO
2.0000 | INHALATION_SPRAY | Freq: Two times a day (BID) | RESPIRATORY_TRACT | Status: DC
  Administered 2013-06-27 – 2013-06-29 (×4): 2 via RESPIRATORY_TRACT
  Filled 2013-06-27 (×2): qty 8.8

## 2013-06-27 MED ORDER — TRAZODONE HCL 50 MG PO TABS
50.0000 mg | ORAL_TABLET | Freq: Every evening | ORAL | Status: DC | PRN
  Administered 2013-06-27 – 2013-06-28 (×2): 50 mg via ORAL
  Filled 2013-06-27 (×2): qty 1

## 2013-06-27 NOTE — Progress Notes (Signed)
Utilization Review Completed.  

## 2013-06-27 NOTE — Care Management Note (Addendum)
    Page 1 of 1   06/29/2013     6:07:17 PM   CARE MANAGEMENT NOTE 06/29/2013  Patient:  Lydia Griffin, Lydia Griffin   Account Number:  [id number]  Date Initiated:  06/27/2013  Documentation initiated by:  MAYO,HENRIETTA  Subjective/Objective Assessment:   adm with dx of status asthmaticus; lives alone, independent PTA     Action/Plan:   Anticipated DC Date:  06/30/2013   Anticipated DC Plan:  HOME W HOME HEALTH SERVICES      DC Planning Services  CM consult  Medication Assistance      Choice offered to / List presented to:             Status of service:  Completed, signed off Medicare Important Message given?   (If response is "NO", the following Medicare IM given date fields will be blank) Date Medicare IM given:   Date Additional Medicare IM given:    Discharge Disposition:  HOME/SELF CARE  Per UR Regulation:  Reviewed for med. necessity/level of care/duration of stay  If discussed at Long Length of Stay Meetings, dates discussed:    Comments:  06/29/13 16:30 CM Met with pt in room and gave her [facility] handout in anticipation to her pending loss of job and insurance. Jeannette Corpus was discussed with pt but she explained the cost was prohibitive.  Pt states she was on Medicaid and will be applying for Medicaid agin this coming week.  Freddy Jaksch, BSN, [phone]  06/27/13 1408 Henrietta Mayo RN MSN BSN CCM Received referral to assist with medications as pt has been noncompliant due to cost.  Per pt, Dulera copay is $80/month.  Reviewed pharmaceutical company patient assistance programs for [employer] as well as Symbicort and Advair.  Pt is currently over income limit for all.  Pt's employer will be closing at the end of September and she will be collecting unemployment benefits.  Advised pt to apply for Medicaid @ DSS.  She states she plans to apply next week, has voucher for free [facility] inhaler and will use that to fill Rx when discharged.

## 2013-06-27 NOTE — Progress Notes (Signed)
Family Medicine Teaching Service Daily Progress Note Intern Pager: [phone]  Patient name: Lydia Griffin Medical record number: [id number] Date of birth: 01/17/1961 Age: 52 y.o. Gender: female  Primary Care Provider: Lora Paula, MD Consultants: none Code Status: full code  Pt Overview and Major Events to Date:   Assessment and Plan:  Lydia Griffin is a 52 y.o. year old female presenting with an asthma exacerbation . PMH is significant asthma requiring intubation x2 in distant past and medication noncompliance. Patient was admitted to stepdown under FPTS for moderate to severe asthma exacerbation, significantly improved today, moving to floor bed.  #asthma - wheezes significantly decreased this morning 3 hours after last breathing treatment, no longer requiring supplemental O2, -continue systemic steroid treatment with prednisone 50 mg daily -continuous O2 monitoring; O2 PRN to keep sats above 92%  -Q6 DuonebQ2 (duoneb) with q2 PRN albuterol nebs   #back pain - reported severe low back pain at presentation, consistent with her chronic pain which gets worse when she has been breathing heavily. Likely musculoskeletal fatigue of accessory muscle use.  Significantly improved this morning with treatments started overnight -Tramadol 50mg  q6h PRN  -Flexeril 10mg  BID PRN muscle spasms   #history of medication compliance: Patient has a history of not being able to afford her medications, mostly her asthma controller medication (Dulera).   We discussed yesterday possibly switching from [facility] to fluticasone if she is unable to pay for [facility].  She took Advair for many years in the past but is unable to afford that either. -case management consult for medication assisstance   FEN/GI: regular diet, miralax PRN, on 50cc/h 1/2NS due to not taking full diet  Prophylaxis: heparin subQ   Disposition: to floor bed today; Dispo pending respiratory improvement   Subjective: Reports feeling "80%"  better this morning with respect to both back pain and breathing.   Tearful and concerned about future ability to obtain and pay for her controller medications.   Reports getting resources from care mgmt at last hospitalization but has not been able to pay for [facility] for over 2 months and she know this is what caused readmission.  She has been using albuterol nebs and MDI at home frequently and she is aware that this is not the best way to manage her asthma.   Objective: Temp:  [98.3 F (36.8 C)-98.9 F (37.2 C)] 98.6 F (37 C) (09/05 0417) Pulse Rate:  [78-117] 82 (09/05 0740) Resp:  [14-28] 14 (09/05 0740) BP: (95-132)/(53-82) 112/66 mmHg (09/05 0600) SpO2:  [89 %-100 %] 100 % (09/05 0740) Weight:  [176 lb (79.833 kg)-178 lb 12.7 oz (81.1 kg)] 178 lb 12.7 oz (81.1 kg) (09/05 0417)  Physical Exam: General appearance - in no acute respiratory distress, looks alert and well, tearful due to financial and job concerns Mental status - alert, oriented to person, place, and time  Chest - examined about 3 hours after last Duoneb, with bilateral mild inspiratory wheezes, and moderate expiratory wheezes, near normal WOB  Heart - regular rate, regular rhythm, normal S1, S2, no murmurs, rubs, clicks or gallops  Abdomen - soft, nontender, nondistended, no masses or organomegaly  Back exam - decreased pain with sitting forward today, no tenderness over the low back  Extremities - peripheral pulses normal, no erythema, no pedal edema, no clubbing or cyanosis  Skin - normal coloration and turgor, no rashes, no suspicious skin lesions noted  Laboratory:  Recent Labs Lab 06/26/13 1401 06/26/13 1623  WBC 8.1 12.2*  HGB 14.7 14.6  HCT 42.3 41.4  PLT 279 274    Recent Labs Lab 06/26/13 1401 06/26/13 1623  NA 138  --   K 4.1  --   CL 103  --   CO2 24  --   BUN 11  --   CREATININE 0.88 0.81  CALCIUM 9.4  --   GLUCOSE 101*  --     Imaging/Diagnostic Tests: Dg Chest Port 1 View   06/26/2013     IMPRESSION: Lungs are mildly hyperexpanded with central peribronchial thickening.  These are stable findings consistent with asthma.  No edema or consolidation.   Original Report Authenticated By: Bretta Bang, M.D.   Shon Millet, Med Student 06/27/2013, 7:57 AM [facility] FPTS Intern pager: [phone], text pages welcome  PGY-3 Addendum I have seen and examined the patient and dicussed with the medical student. Briefly:  S: Reports feeling much better this afternoon.  Still a little short of breath with activity, but reports being able to take a deep breath.  She reports being out of [facility] for the last 1-2 months.  She has found a website with a coupon for 1 month free and 6 months for $20 off.  Cost is $80 with her insurance.  O: BP 112/63  Pulse 96  Temp(Src) 97.9 F (36.6 C) (Oral)  Resp 21  Ht 5\' 6"  (1.676 m)  Wt 178 lb 12.7 oz (81.1 kg)  BMI 28.87 kg/m2  SpO2 97% Gen: alert, cooperative, NAD Pulm: normal work of breathing, fair to good air movement, scattered expiratory wheezes, rare inspiratory wheezes  A/P: 52 yo woman with asthma in an exacerbation secondary to being without meds due to financial reasons. # Asthma exacerbation: improving - transfer to floor - O2 as needed - space duonebs to q6hr - albuterol q2 prn - start dulera (to be discharged with inhaler) - CM on board to help with medication finances  # Back pain: likely spasms due to respiratory distress - continue IVF for now - flexeril 10mg  BID prn  FEN/GI: regular diet; NS 50cc/hr PPx: SQ heparin Dispo: pending improvement  Hernan Turnage 06/27/2013, 9:50 AM

## 2013-06-27 NOTE — Progress Notes (Signed)
FMTS Attending Note Patient seen and examined by me today, discussed with resident team and I agree with Dr Harrel Lemon assess/plan for today.  Lydia Griffin is breathing much more comfortably today, on room air.  Her lung exam exhibits persistent expiratory wheezes.  She is taking Prednisone 50mg  daily which began today.  In trying to isolate triggers, she seems to do well when on her [name], which ran out about 2 months ago and she has been unable to refill due to cost.  She had been well controlled in the past on Advair as well.  I have spoken with Care Management about alternatives to [facility] (ie, Symbicort or Advair) that might be more economically feasible for her.  She is concerned about the imminent loss of her job in 2 months when the facility where she works will close.  We anticipate likely discharge tomorrow on continued systemic prednisone burst for 7-10 days, and she will take the [facility] given in the hospital for continued use until her outpatient follow up appointment.  Paula Compton, MD

## 2013-06-27 NOTE — Discharge Summary (Signed)
Family Medicine Teaching [facility] Discharge Summary  Patient name: Lydia Griffin Medical record number: [id number] Date of birth: 16-Mar-1961 Age: 52 y.o. Gender: female Date of Admission: 06/26/2013  Date of Discharge: 06/29/2013 Admitting Physician: Barbaraann Barthel, MD  Primary Care Provider: Lora Paula, MD Consultants: home  Indication for Hospitalization: Asthma exacerbation  Discharge Diagnoses/Problem List:   Asthma exacerbation  Back pain  Vaginitis  Disposition: home  Discharge Condition: good  Brief Hospital Course:  Presented to ED with several days of worsening asthma symptoms despite maximizing her home treatment options.  On presentation she had severe wheezing, SOB, and room air O2 sats in the high 80's.    #asthma exacerbation Likely triggered by being without medication x2 months due to cost.  Received continuous nebs in the ED and IV steroids.  Was admitted for scheduled Duoneb treatments and started on PO prenisone.  Symptoms rapidly improved; all signs and symptoms were returned to baseline on day of discharge.  She was discharged with [facility] inhaler (steroid + LABA) and a prednisone taper.   She will still have her home nebulizer and rescue inhaler available also.  #back pain  She reported severe low back pain at presentation, consistent with her chronic pain which gets worse when she has been breathing heavily. Likely musculoskeletal fatigue of accessory muscle use.  Treated with PRN Flexeril and Tramadol during admission, resolved at discharge.  #recurrent vaginitis On day of discharge she complained of thick white vaginal discharge consistent with previous episodes of vaginitis and was given 7 days of oral metronidazole.   # history of medication compliance:  Patient has a history of medication noncompliance due to financial concerns.   She had not been using her prescribed asthma controller medication (Dulera) due to the cost.  On her previous  admission, efforts were made to ensure that she would be able to obtain her medication but she was still unable to.  On this admission, CM helped patient find discount coupons for the medication, and she has a good plan for obtaining medication for the coming months.   Issues for Follow Up:  1. See if she has been able to get her Dulera inhaler  Significant Procedures: none  Significant Labs and Imaging:  Dg Chest Port 1 View  06/26/2013   IMPRESSION: Lungs are mildly hyperexpanded with central peribronchial thickening.  These are stable findings consistent with asthma.  No edema or consolidation.    Recent Labs Lab 06/26/13 1401 06/26/13 1623  WBC 8.1 12.2*  HGB 14.7 14.6  HCT 42.3 41.4  PLT 279 274    Recent Labs Lab 06/26/13 1401 06/26/13 1623  NA 138  --   K 4.1  --   CL 103  --   CO2 24  --   GLUCOSE 101*  --   BUN 11  --   CREATININE 0.88 0.81  CALCIUM 9.4  --     Results/Tests Pending at Time of Discharge: none  Discharge Medications:    Medication List         albuterol (5 MG/ML) 0.5% nebulizer solution  Commonly known as:  PROVENTIL  Take 5 mg by nebulization every 6 (six) hours as needed. For shortness of breath/wheezing     albuterol 108 (90 BASE) MCG/ACT inhaler  Commonly known as:  PROVENTIL HFA;VENTOLIN HFA  Inhale 2 puffs into the lungs every 4 (four) hours as needed. For shortness of breath/wheezing     Biotin 1000 MCG tablet  Take 1,000  mcg by mouth daily.     cyclobenzaprine 10 MG tablet  Commonly known as:  FLEXERIL  Take 1 tablet (10 mg total) by mouth 2 (two) times daily as needed for muscle spasms.     DIPHENHYDRAMINE HCL (TOPICAL) 2 % Gel  Apply 1 application topically daily as needed (for itching).     ipratropium-albuterol 0.5-2.5 (3) MG/3ML Soln  Commonly known as:  DUONEB  Take 3 mLs by nebulization every 4 (four) hours as needed.     loratadine 10 MG tablet  Commonly known as:  CLARITIN  Take 10 mg by mouth daily.      metroNIDAZOLE 500 MG tablet  Commonly known as:  FLAGYL  Take 1 tablet (500 mg total) by mouth every 12 (twelve) hours.     mometasone-formoterol 200-5 MCG/ACT Aero  Commonly known as:  DULERA  Inhale 2 puffs into the lungs 2 (two) times daily.     montelukast 10 MG tablet  Commonly known as:  SINGULAIR  Take 1 tablet (10 mg total) by mouth at bedtime.     multivitamin with minerals Tabs tablet  Take 1 tablet by mouth daily.     predniSONE 20 MG tablet  Commonly known as:  DELTASONE  Take 1-2 tablets (20-40 mg total) by mouth as directed. Take 40 mg once daily for 4 days (from Monday, 06/30/2013 to Thursday, 07/04/2013), then take  20 mg daily for another 4 days (Friday 07/05/2013 to Monday 07/07/2013) and stop.     PRO-BIOTIC BLEND PO  Take 1 tablet by mouth daily.     traZODone 50 MG tablet  Commonly known as:  DESYREL  Take 1 tablet (50 mg total) by mouth at bedtime.     VITAMIN B 12 PO  Place 1 tablet under the tongue daily.        Discharge Instructions: Please refer to Patient Instructions section of EMR for full details.  Patient was counseled important signs and symptoms that should prompt return to medical care, changes in medications, dietary instructions, activity restrictions, and follow up appointments.   Follow-Up Appointments: Follow-up Information   Follow up with Lora Paula, MD. (Please call family medicine Center for an appointment with your primary care physician.)    Specialty:  Family Medicine   Contact information:   [address] [place] [address] [phone]       Charm Rings, MD 06/30/2013, 4:59 PM PGY-3, [facility] Family Medicine

## 2013-06-28 DIAGNOSIS — N760 Acute vaginitis: Secondary | ICD-10-CM

## 2013-06-28 MED ORDER — MONTELUKAST SODIUM 10 MG PO TABS
10.0000 mg | ORAL_TABLET | Freq: Every day | ORAL | Status: DC
  Administered 2013-06-28: 10 mg via ORAL
  Filled 2013-06-28 (×2): qty 1

## 2013-06-28 NOTE — Progress Notes (Signed)
Peak flow average was 280 which is in the yellow for the patient.  Her best should be at least 410.

## 2013-06-28 NOTE — Progress Notes (Signed)
Family Medicine Teaching Service Daily Progress Note Intern Pager: [phone]  Patient name: Lydia Griffin Medical record number: [id number] Date of birth: [birth year] Age: 52 y.o. Gender: female  Primary Care Provider: Lora Paula, MD Consultants: none Code Status: full code  Pt Overview and Major Events to Date:   Assessment and Plan:  Lydia Griffin is a 52 y.o. year old female presenting with an asthma exacerbation . PMH is significant asthma requiring intubation x2 in distant past and medication noncompliance. Patient was admitted to stepdown under FPTS for moderate to severe asthma exacerbation, significantly improved today. She feels close to her baseline today.   # Asthma - Appears comfortable on room air. Peak Flow is 280 cc. She is usually in 500s.   Not on supplemental O2, chest still somehow tight on auscultation.  Plan  -continue systemic steroid treatment with prednisone 50 mg daily -continuous O2 monitoring; O2 PRN to keep sats above 92%  -Q6 Duoneb  - will transfer to med-surg floor   #Back pain - Improved. She reported severe low back pain at presentation, consistent with her chronic pain which gets worse when she has been breathing heavily. Likely musculoskeletal fatigue of accessory muscle use.   -Tramadol 50mg  q6h PRN  -Flexeril 10mg  BID PRN muscle spasms   #history of medication compliance: Patient has a history of not being able to afford her medications, mostly her asthma controller medication (Dulera).   Switcher to [employer]. She reports that she has a coupon with 6 refills for $20 each. I will give her written rx so that she can refill these.   She can not afford Advair.  Plan  - will provide rx for [facility] with 6 refill son discharge.   FEN/GI: regular diet, miralax PRN,. D/c IVF.  Prophylaxis: heparin subQ   Disposition: to floor bed today. Not completed yesterday. She will stay one more day pending respiratory improvement   Subjective: She feels  better today. She reports that she is 90% back to baseline in terms of her breathing. No new complaints. No overnight events. Had a shower this am.  Objective: Temp:  [97.6 F (36.4 C)-98.5 F (36.9 C)] 97.6 F (36.4 C) (09/06 0400) Pulse Rate:  [69-106] 69 (09/06 0400) Resp:  [16-25] 20 (09/06 0400) BP: (95-138)/(53-68) 112/68 mmHg (09/06 0400) SpO2:  [92 %-99 %] 96 % (09/06 0731)  Physical Exam: General appearance - in no acute respiratory distress,  Mental status - alert, oriented to person, place, and time  Chest - bilateral mild inspiratory wheezes, and moderate expiratory wheezes. Normal work of breathing. Peak flow still low at 280. Her best is 500. Heart - regular rate, regular rhythm, normal S1, S2, no murmurs, rubs, clicks or gallops  Abdomen - soft, nontender, nondistended, no masses or organomegaly  Extremities - peripheral pulses normal, no erythema, no pedal edema, no clubbing or cyanosis  Skin - normal coloration and turgor, no rashes, no suspicious skin lesions noted  Laboratory:  Recent Labs Lab 06/26/13 1401 06/26/13 1623  WBC 8.1 12.2*  HGB 14.7 14.6  HCT 42.3 41.4  PLT 279 274    Recent Labs Lab 06/26/13 1401 06/26/13 1623  NA 138  --   K 4.1  --   CL 103  --   CO2 24  --   BUN 11  --   CREATININE 0.88 0.81  CALCIUM 9.4  --   GLUCOSE 101*  --     Imaging/Diagnostic Tests: Dg Chest [facility] [address]  06/26/2013    IMPRESSION: Lungs are mildly hyperexpanded with central peribronchial thickening.  These are stable findings consistent with asthma.  No edema or consolidation.   Original Report Authenticated By: Bretta Bang, M.D.   Dow Adolph, MD 06/28/2013, 9:02 AM [place] [facility] FPTS Intern pager: [phone], text pages welcome

## 2013-06-28 NOTE — Progress Notes (Signed)
Patient states that she is having some moderate thick vaginal discharge that she has recently noticed. She states that this has been an issue for her in the past, and she was prescribed flagyl. Maxie Better

## 2013-06-28 NOTE — Progress Notes (Signed)
FMTS Attending Note  Patient seen and examined by me, discussed with resident team and I agree with assessment and plan. Lydia Griffin states she is 90% back to baseline but is concerned bc wet weather always makes her worse. She was receiving a neb and still had prolonged exp, wheezes, and rhonchi. Will cont current tx and re-eval in AM. She is OK with the plan.

## 2013-06-29 DIAGNOSIS — N760 Acute vaginitis: Secondary | ICD-10-CM

## 2013-06-29 MED ORDER — CYCLOBENZAPRINE HCL 10 MG PO TABS: 10.0000 mg | ORAL_TABLET | Freq: Two times a day (BID) | ORAL | Status: DC | PRN

## 2013-06-29 MED ORDER — ALBUTEROL SULFATE (5 MG/ML) 0.5% IN NEBU: 2.5000 mg | INHALATION_SOLUTION | Freq: Four times a day (QID) | RESPIRATORY_TRACT | Status: DC | PRN

## 2013-06-29 MED ORDER — MOMETASONE FURO-FORMOTEROL FUM 200-5 MCG/ACT IN AERO: 2.0000 | INHALATION_SPRAY | Freq: Two times a day (BID) | RESPIRATORY_TRACT | Status: DC

## 2013-06-29 MED ORDER — ALBUTEROL SULFATE (5 MG/ML) 0.5% IN NEBU: 2.5000 mg | INHALATION_SOLUTION | Freq: Three times a day (TID) | RESPIRATORY_TRACT | Status: DC

## 2013-06-29 MED ORDER — METRONIDAZOLE 500 MG PO TABS
500.0000 mg | ORAL_TABLET | Freq: Two times a day (BID) | ORAL | Status: DC
  Administered 2013-06-29: 500 mg via ORAL
  Filled 2013-06-29 (×2): qty 1

## 2013-06-29 MED ORDER — PREDNISONE 20 MG PO TABS: 20.0000 mg | ORAL_TABLET | ORAL | Status: DC

## 2013-06-29 MED ORDER — ALBUTEROL SULFATE HFA 108 (90 BASE) MCG/ACT IN AERS
2.0000 | INHALATION_SPRAY | Freq: Once | RESPIRATORY_TRACT | Status: AC
  Administered 2013-06-29: 2 via RESPIRATORY_TRACT
  Filled 2013-06-29: qty 6.7

## 2013-06-29 MED ORDER — TRAZODONE HCL 50 MG PO TABS: 50.0000 mg | ORAL_TABLET | Freq: Every day | ORAL | Status: DC

## 2013-06-29 MED ORDER — IPRATROPIUM BROMIDE 0.02 % IN SOLN: 0.5000 mg | Freq: Three times a day (TID) | RESPIRATORY_TRACT | Status: DC

## 2013-06-29 MED ORDER — ALBUTEROL SULFATE HFA 108 (90 BASE) MCG/ACT IN AERS: 2.0000 | INHALATION_SPRAY | RESPIRATORY_TRACT | Status: DC | PRN

## 2013-06-29 MED ORDER — METRONIDAZOLE 500 MG PO TABS: 500.0000 mg | ORAL_TABLET | Freq: Two times a day (BID) | ORAL | Status: DC

## 2013-06-29 NOTE — Progress Notes (Signed)
Patient alert and oriented x4.  Pt given discharge instructions and denies any questions or concerns.  IV access removed cannula intact no bleeding, bruising or drainage to site.  Pt given prescriptions and discharged home per wheelchair per private vehicle.

## 2013-06-29 NOTE — Progress Notes (Signed)
FMTS Attending Note  I personally saw and evaluated the patient. The plan of care was discussed with the resident team. I agree with the assessment and plan as documented by the resident. She now states she is at baseline and ready to go home. She still has a cough but states that that is not unusual after an exac. Her lungs have good air movement with much less wheezing than yesterday. She c/o thick white vag D/C and states she has gotten this before and is 2/2 pH imbalance and resolves with metronidazole. Will tx for 7 days 500 mg BID. Obtaining meds is the issue now and Dr Kirtland Bouchard is working with care mgmt to obtain them.

## 2013-06-29 NOTE — Progress Notes (Signed)
Family Medicine Teaching Service Daily Progress Note Intern Pager: [phone]  Patient name: Lydia Griffin Medical record number: [id number] Date of birth: Dec 14, 1960 Age: 52 y.o. Gender: female  Primary Care Provider: Lora Paula, MD Consultants: none Code Status: full code  Pt Overview and Major Events to Date:   Assessment and Plan:  Lydia Griffin is a 52 y.o. year old female presenting with an asthma exacerbation . PMH is significant asthma requiring intubation x2 in distant past and medication noncompliance. Patient was admitted to stepdown under FPTS for moderate to severe asthma exacerbation. Transferred to floor on 9/6. She has significantly improved today and ready to go home. She is complaining of vaginal irritation with some discharge. This is usual for her whenever her "PH" changes. She usually use Flagyl to help treat this and she also takes a probiotic.    # Asthma - Much improved. Chest exam with scanty wheezes. Not on supplemental O2. Plan  -continue systemic steroid treatment with prednisone 50 mg daily -continuous O2 monitoring; O2 PRN to keep sats above 92%  -Q6 Duoneb  -discharge home today.   # Vaginal irritation: Pt has what sounds like recurrent vaginitis. Uses Flagyl for this with good results. She has challenges affording her meds. Plan  -  Metronidazole 500 mg twice a day for 7 days. -  Case manager consult for Flagyl pills so that she can be discharged   #Back pain - Improved. She reported severe low back pain at presentation, consistent with her chronic pain which gets worse when she has been breathing heavily. Likely musculoskeletal fatigue of accessory muscle use.   -Tramadol 50mg  q6h PRN  -Flexeril 10mg  BID PRN muscle spasms   # history of medication compliance: Patient has a history of not being able to afford her medications, mostly her asthma controller medication (Dulera).   Switcher to [employer]. She reports that she has a coupon with 6 refills  for $20 each. I will give her written rx so that she can refill these.   She can not afford Advair.  Plan  - will provide rx for [facility] with 6 refills on discharge.   FEN/GI: regular diet, miralax PRN,. D/c IVF.  Prophylaxis: heparin subQ   Disposition: She is ready for discharge. She will followup with family medicine Center. She will call for an appointment. Case manager will be consulted to provide metronidazole pills before she can go home. I will provide her with a work excuse letter for Thursday and Friday.   Subjective: She feels much improved. She feels she is back to normal baseline. She is looking forward to going home today. Her only complaint is for vaginal irritation, which she attributes to change in her "pH ". She requests a prescription of metronidazole, which usually helps with her vagina irritation. She denies urinary frequency, dysuria, or fevers. No abdominal pain.  Objective: Temp:  [97.6 F (36.4 C)-98.4 F (36.9 C)] 98.4 F (36.9 C) (09/07 0607) Pulse Rate:  [67-94] 67 (09/07 0607) Resp:  [18-20] 18 (09/07 0607) BP: (115-127)/(62-74) 125/62 mmHg (09/07 0607) SpO2:  [91 %-98 %] 96 % (09/07 1478)  Physical Exam: General appearance - in no acute respiratory distress,  Mental status - alert, oriented to person, place, and time  Chest - bilateral mild inspiratory wheezes. There are much improved compared to yesterday. She looks very comfortable without labored breathing.  Heart - regular rate, regular rhythm, normal S1, S2, no murmurs, rubs, clicks or gallops  Abdomen - soft, nontender,  nondistended, no masses or organomegaly. Pelvic exam not performed.  Extremities - peripheral pulses normal, no erythema, no pedal edema, no clubbing or cyanosis  Skin - normal coloration and turgor, no rashes, no suspicious skin lesions noted  Laboratory:  Recent Labs Lab 06/26/13 1401 06/26/13 1623  WBC 8.1 12.2*  HGB 14.7 14.6  HCT 42.3 41.4  PLT 279 274    Recent  Labs Lab 06/26/13 1401 06/26/13 1623  NA 138  --   K 4.1  --   CL 103  --   CO2 24  --   BUN 11  --   CREATININE 0.88 0.81  CALCIUM 9.4  --   GLUCOSE 101*  --     Imaging/Diagnostic Tests: Dg Chest Port 1 View   06/26/2013    IMPRESSION: Lungs are mildly hyperexpanded with central peribronchial thickening.  These are stable findings consistent with asthma.  No edema or consolidation.   Original Report Authenticated By: Bretta Bang, M.D.   Dow Adolph, MD 06/29/2013, 8:49 AM [facility] FPTS Intern pager: [phone], text pages welcome

## 2013-11-07 ENCOUNTER — Emergency Department (HOSPITAL_COMMUNITY): Payer: 59

## 2013-11-07 ENCOUNTER — Emergency Department (HOSPITAL_COMMUNITY)
Admission: EM | Admit: 2013-11-07 | Discharge: 2013-11-07 | Disposition: A | Discharge status: Home / Self Care | Payer: 59 | Attending: Emergency Medicine | Admitting: Emergency Medicine

## 2013-11-07 ENCOUNTER — Encounter (HOSPITAL_COMMUNITY): Payer: Self-pay | Admitting: Emergency Medicine

## 2013-11-07 DIAGNOSIS — R110 Nausea: Secondary | ICD-10-CM | POA: Insufficient documentation

## 2013-11-07 DIAGNOSIS — J45901 Unspecified asthma with (acute) exacerbation: Secondary | ICD-10-CM | POA: Insufficient documentation

## 2013-11-07 DIAGNOSIS — Z79899 Other long term (current) drug therapy: Secondary | ICD-10-CM | POA: Insufficient documentation

## 2013-11-07 DIAGNOSIS — J45909 Unspecified asthma, uncomplicated: Secondary | ICD-10-CM

## 2013-11-07 MED ORDER — PREDNISONE 20 MG PO TABS: 40.0000 mg | ORAL_TABLET | Freq: Every day | ORAL | Status: DC

## 2013-11-07 MED ORDER — ALBUTEROL SULFATE HFA 108 (90 BASE) MCG/ACT IN AERS: 2.0000 | INHALATION_SPRAY | RESPIRATORY_TRACT | Status: DC | PRN

## 2013-11-07 MED ORDER — MOMETASONE FURO-FORMOTEROL FUM 200-5 MCG/ACT IN AERO: 2.0000 | INHALATION_SPRAY | Freq: Once | RESPIRATORY_TRACT | Status: DC

## 2013-11-07 MED ORDER — MONTELUKAST SODIUM 10 MG PO TABS: 10.0000 mg | ORAL_TABLET | Freq: Every day | ORAL | Status: DC

## 2013-11-07 MED ORDER — ALBUTEROL SULFATE (2.5 MG/3ML) 0.083% IN NEBU
5.0000 mg | INHALATION_SOLUTION | Freq: Once | RESPIRATORY_TRACT | Status: AC
  Administered 2013-11-07: 5 mg via RESPIRATORY_TRACT
  Filled 2013-11-07: qty 6

## 2013-11-07 NOTE — ED Notes (Signed)
Pt. Has a few day c/o asthma and URI. Pt. Has been given Albuterol/Atriovent 5/5, solumedrol 125mg .and Zofran 8mg  IV. Pt. Was 96% on RA with EMS.  PT. denies v/d and fevers.  Pt. Has sick contacts at home.

## 2013-11-07 NOTE — Progress Notes (Signed)
ED CM consulted .to meet with patient regarding medication assistance.  [facility] with SOB exacerbation of asthma. In room to speak with patient, She reports  she has ran out of her  Medications. and is out of work without [employer] insurance.  She states, she  does not have.any funds and will be homeless by weekend. . Discussed the MATCH program and the guidelines. Pt verbalies understanding and agrees with plan.  Pt is eligible, patient  Enrolled MATCH letter printed and given to patient with guidelines explained, and  instructions provided with  a list of  participating pharmacies. Explained the $3.00 copay per prescriptions.Pt states, that she does not have any money at this time and explains that she does not have anyone to borrow money from. She states, is technically homeless.Discussed the importance of f/u care.  [facility] and [employer] Card.. Pt provided permission to have someone [facility] the [facility] contact her to set up appointment with f/u care. Pt agrees with discharge plan.  Discussed discharge plan with Junious SilkHannah Merrell PA-C and Hughes BetterJacinta RN  They both agrees with plan. No further ED CM needs identified.

## 2013-11-07 NOTE — ED Notes (Signed)
Pt. Back from ultrasound

## 2013-11-07 NOTE — ED Notes (Signed)
Adjusted pt.'s bed

## 2013-11-07 NOTE — ED Notes (Signed)
Pt. Is calling a ride to pick her up.

## 2013-11-07 NOTE — ED Provider Notes (Signed)
Medical screening examination/treatment/procedure(s) were performed by non-physician practitioner and as supervising physician I was immediately available for consultation/collaboration.  EKG Interpretation   None         Itzayanna Kaster T Chrishonda Hesch, MD 11/07/13 2255 

## 2013-11-07 NOTE — ED Notes (Signed)
Social worker gave pt. Information for prescriptions.

## 2013-11-07 NOTE — Discharge Instructions (Signed)
Asthma, Adult °Asthma is a recurring condition in which the airways tighten and narrow. Asthma can make it difficult to breathe. It can cause coughing, wheezing, and shortness of breath. Asthma episodes (also called asthma attacks) range from minor to life-threatening. Asthma cannot be cured, but medicines and lifestyle changes can help control it. °CAUSES °Asthma is believed to be caused by inherited (genetic) and environmental factors, but its exact cause is unknown. Asthma may be triggered by allergens, lung infections, or irritants in the air. Asthma triggers are different for each person. Common triggers include:  °· Animal dander. °· Dust mites. °· Cockroaches. °· Pollen from trees or grass. °· Mold. °· Smoke. °· Air pollutants such as dust, household cleaners, hair sprays, aerosol sprays, paint fumes, strong chemicals, or strong odors. °· Cold air, weather changes, and winds (which increase molds and pollens in the air). °· Strong emotional expressions such as crying or laughing hard. °· Stress. °· Certain medicines (such as aspirin) or types of drugs (such as beta-blockers). °· Sulfites in foods and drinks. Foods and drinks that may contain sulfites include dried fruit, potato chips, and sparkling grape juice. °· Infections or inflammatory conditions such as the flu, a cold, or an inflammation of the nasal membranes (rhinitis). °· Gastroesophageal reflux disease (GERD). °· Exercise or strenuous activity. °SYMPTOMS °Symptoms may occur immediately after asthma is triggered or many hours later. Symptoms include: °· Wheezing. °· Excessive nighttime or early morning coughing. °· Frequent or severe coughing with a common cold. °· Chest tightness. °· Shortness of breath. °DIAGNOSIS  °The diagnosis of asthma is made by a review of your medical history and a physical exam. Tests may also be performed. These may include: °· Lung function studies. These tests show how much air you breath in and out. °· Allergy  tests. °· Imaging tests such as X-rays. °TREATMENT  °Asthma cannot be cured, but it can usually be controlled. Treatment involves identifying and avoiding your asthma triggers. It also involves medicines. There are 2 classes of medicine used for asthma treatment:  °· Controller medicines. These prevent asthma symptoms from occurring. They are usually taken every day. °· Reliever or rescue medicines. These quickly relieve asthma symptoms. They are used as needed and provide short-term relief. °Your health care provider will help you create an asthma action plan. An asthma action plan is a written plan for managing and treating your asthma attacks. It includes a list of your asthma triggers and how they may be avoided. It also includes information on when medicines should be taken and when their dosage should be changed. An action plan may also involve the use of a device called a peak flow meter. A peak flow meter measures how well the lungs are working. It helps you monitor your condition. °HOME CARE INSTRUCTIONS  °· Take medicine as directed by your health care provider. Speak with your health care provider if you have questions about how or when to take the medicines. °· Use a peak flow meter as directed by your health care provider. Record and keep track of readings. °· Understand and use the action plan to help minimize or stop an asthma attack without needing to seek medical care. °· Control your home environment in the following ways to help prevent asthma attacks: °· Do not smoke. Avoid being exposed to secondhand smoke. °· Change your heating and air conditioning filter regularly. °· Limit your use of fireplaces and wood stoves. °· Get rid of pests (such as roaches and   mice) and their droppings. °· Throw away plants if you see mold on them. °· Clean your floors and dust regularly. Use unscented cleaning products. °· Try to have someone else vacuum for you regularly. Stay out of rooms while they are being  vacuumed and for a short while afterward. If you vacuum, use a dust mask from a hardware store, a double-layered or microfilter vacuum cleaner bag, or a vacuum cleaner with a HEPA filter. °· Replace carpet with wood, tile, or vinyl flooring. Carpet can trap dander and dust. °· Use allergy-proof pillows, mattress covers, and box spring covers. °· Wash bed sheets and blankets every week in hot water and dry them in a dryer. °· Use blankets that are made of polyester or cotton. °· Clean bathrooms and kitchens with bleach. If possible, have someone repaint the walls in these rooms with mold-resistant paint. Keep out of the rooms that are being cleaned and painted. °· Wash hands frequently. °SEEK MEDICAL CARE IF:  °· You have wheezing, shortness of breath, or a cough even if taking medicine to prevent attacks. °· The colored mucus you cough up (sputum) is thicker than usual. °· Your sputum changes from clear or white to yellow, green, gray, or bloody. °· You have any problems that may be related to the medicines you are taking (such as a rash, itching, swelling, or trouble breathing). °· You are using a reliever medicine more than 2 3 times per week. °· Your peak flow is still at 50 79% of you personal best after following your action plan for 1 hour. °SEEK IMMEDIATE MEDICAL CARE IF:  °· You seem to be getting worse and are unresponsive to treatment during an asthma attack. °· You are short of breath even at rest. °· You get short of breath when doing very little physical activity. °· You have difficulty eating, drinking, or talking due to asthma symptoms. °· You develop chest pain. °· You develop a fast heartbeat. °· You have a bluish color to your lips or fingernails. °· You are lightheaded, dizzy, or faint. °· Your peak flow is less than 50% of your personal best. °· You have a fever or persistent symptoms for more than 2 3 days. °· You have a fever and symptoms suddenly get worse. °MAKE SURE YOU:  °· Understand these  instructions. °· Will watch your condition. °· Will get help right away if you are not doing well or get worse. °Document Released: 10/09/2005 Document Revised: 06/11/2013 Document Reviewed: 05/08/2013 °ExitCare® Patient Information ©2014 [employer]. ° ° °Emergency Department Resource Guide °1) Find a Doctor and Pay Out of Pocket °Although you won't have to find out who is covered by your insurance plan, it is a good idea to ask around and get recommendations. You will then need to call the office and see if the doctor you have chosen will accept you as a new patient and what types of options they offer for patients who are self-pay. Some doctors offer discounts or will set up payment plans for their patients who do not have insurance, but you will need to ask so you aren't surprised when you get to your appointment. ° °2) Contact Your Local Health Department °Not all health departments have doctors that can see patients for sick visits, but many do, so it is worth a call to see if yours does. If you don't know where your local health department is, you can check in your phone book. The CDC also has a   tool to help you locate your state's health department, and many state websites also have listings of all of their local health departments.  3) Find a [facility] If your illness is not likely to be very severe or complicated, you may want to try a walk in clinic. These are popping up all over the country in pharmacies, drugstores, and shopping centers. They're usually staffed by nurse practitioners or physician assistants that have been trained to treat common illnesses and complaints. They're usually fairly quick and inexpensive. However, if you have serious medical issues or chronic medical problems, these are probably not your best option.  No Primary Care Doctor: - Call Health Connect at  [phone] - they can help you locate a primary care doctor that  accepts your insurance, provides certain services,  etc. - Physician Referral Service- [phone]  Chronic Pain Problems: Organization         Address  Phone   Notes  [facility]  [phone] Patients need to be referred by their primary care doctor.   Medication Assistance: Organization         Address  Phone   Notes  [facility] Medication [facility] [place], [place] [address] [phone] --Must be a resident of [facility] -- Must have NO insurance coverage whatsoever (no Medicaid/ Medicare, etc.) -- The pt. MUST have a primary care doctor that directs their care regularly and follows them in the community   [facility]  [phone]   [employer]  [phone]    Agencies that provide inexpensive medical care: Organization         Address  Phone   Notes  [facility]  [phone]   [facility]    [phone]   [facility] [place], [place] [address] [phone]   [place] [address], [place] [phone]   Planned Parenthood    [phone]   [facility]    [phone]   [place] and [place], [place] Phone:  [phone], Fax:  [phone] Hours of Operation:  9 am - 6 pm, M-F.  Also accepts Medicaid/Medicare and self-pay.  [facility] for [place], [address], [place] Phone: [phone], Fax: [phone]. Hours of Operation:  8:30 am - 5:30 pm, M-F.  Also accepts Medicaid and self-pay.  [facility] [place] [address] [place] Phone: [phone]   [place], [place], [place] [phone], Ext. [phone] Mondays & Thursdays: 7-9 AM.  First 15 patients are seen on a first come, first serve basis.    [place] Providers:  Organization         Address  Phone   Notes  [facility] [address], [place] [phone] Also accepts self-pay patients.  [facility] [id number] [place], [place]  [phone]   [place], [address], [place] [phone]   [facility] [address] [place] [phone]   Lucianne Lei [address], [place]   [phone] Only accepts [place] Access [place] patients after they have their name applied to their card.   Self-Pay (no insurance) in [place]:  Organization         Address  Phone   Notes  Sickle Cell Patients, [facility] [place] [phone]   [facility] [place] [phone]   [facility] [place]  [place], [address], [place] [phone]   [facility]/Dr. Osei-Bonsu  [address], [place] or [address], [place] [phone] Phone number for both [place] and [place] locations is the same.  [facility] [address] [place] [phone]   [facility] [address], [place] or [address] [phone]   [facility] [address] [place] [phone], phone; [phone], fax Sees patients 1st and 3rd Saturday of every month.  Must not qualify for public or private insurance (i.e. Medicaid, Medicare, [place] Health Choice, Veterans' Benefits)  Household income should be no more than 200% of the poverty level The clinic cannot treat you if you are pregnant or think you are pregnant  Sexually transmitted diseases are not treated at the clinic.    Dental Care: Organization         Address  Phone  Notes  [facility] [place] [phone] Accepts children up to  age 68 who are enrolled in [place] or [place]; pregnant women with a Medicaid card; and children who have applied for Medicaid or [place] Health Choice, but were declined, whose parents can pay a reduced fee at time of service.  [facility] Department of [facility]  [address], [place] [phone] Accepts children up to age 41 who are enrolled in [place] or [place]; pregnant women with a Medicaid card; and children who have applied for Medicaid or [place] Health Choice, but were declined, whose parents can pay a reduced fee at time of service.  [place] Adult [facility] Access PROGRAM  [place] [phone] Patients are seen by appointment only. Walk-ins are not accepted. [place] will see patients 7 years of age and older. Monday - Tuesday (8am-5pm) Most Wednesdays (8:30-5pm) $30 per visit, cash only  [facility] Access PRO[facility]  [address], [place] [facility] [phone] Patients are seen by appointment only. Walk-ins are not accepted. [place] will see patients 87 years of age and older. One Wednesday Evening (Monthly: Volunteer Based).  $30 per visit, cash only  [place]  [phone] for adults; Children under age 74, call Graduate Pediatric Dentistry at [phone]. Children aged 51-14, please call [phone] to request a pediatric application.  Dental services are provided in all areas of dental care including fillings, crowns and bridges, complete and partial dentures, implants, gum treatment, root canals, and extractions. Preventive care is also provided. Treatment is provided to both adults and children. Patients are selected via a lottery and there is often a waiting list.   [facility] [address] [place]  [phone] [email or web address]   [facility] [address] [place], [place] [phone], Ext. [phone] Second and Fourth Thursday of  each month, opens at 6:30 AM; Clinic ends at 9 AM.  Patients are seen on a first-come first-served basis, and a limited number are seen during each clinic.  [facility]  [address] Hillard Danker [place], [place] [phone]   Eligibility Requirements You must have lived in [place], [place], or [place] counties for at least the last three months.   You cannot be eligible for state or federal sponsored [employer], including [employer], [place], or [employer].   You generally cannot be eligible for healthcare insurance through your employer.    How to apply: Eligibility screenings are held every Tuesday and Wednesday afternoon from 1:00 pm until 4:00 pm. You do not need an appointment for the interview!  [facility] [address], [place], [place]   [place]  [place] Department  [place]  [phone]    Behavioral Health Resources in the Community: Intensive Outpatient Programs Organization         Address  Phone  Notes  Colma Roseland. [address] [place], [place] [phone]   [facility]patient [address] [place], [place]   ADS: [facility] [address] [place], [employer]   [place] [address]  [employer], [place] or [phone]   Substance Abuse Resources Organization         Address  Phone  Notes  Al[facility] Drug Services  [phone]   [place]  [phone]   [facility] [place]   Chinita Pester  [phone]   Residential & Outpatient Substance Abuse Program  [phone]   Psychological Services Organization         Address  Phone  Notes  [facility] [place]  [place]  [phone]   [place] [address],  [place] or [phone]    Mobile Crisis Teams Organization         Address  Phone  Notes  [facility], Mobile Crisis Care Unit  [phone]   [facility]  [address] [place], [place]   [place] [address] [place] [phone]    Self-Help/Support Groups Organization         Address  Phone             Notes  [place]. of [place] - variety of support groups  [place] Call for more information  Narcotics Anonymous (NA), [facility] [address], [employer] [place]  2 meetings at this location   Special educational needs teacher         Address  Phone  Notes  [facility] [place],    [place]  [phone]   [facility]  [address], [place] [id number], [place], [place]   [place], [place] [phone] Admissions: 8am-3pm M-F  Incentives Substance [place] [address],    [place], [place] [phone]   [facility] [address] Jadene Pierini [place], [place]   [facility] [address],  [place], [place]   [facility] - Intensive Outpatient [address] Kristeen [address], [place], [place]   [facility] ([place].,  [place], [place] or [phone]   [facility] ([facility]) [address], [place], [place] Accepts Medicaid  [facility] [place] [address]  [place] [phone] Substance Abuse/Addiction Treatment  [place] Behavioral Health Resources °Organization         Address  Phone  Notes  °[facility]  [phone]   °Julie Brannon, PhD [address] [name], [place]   ([phone]) [phone] or (336) [phone]   °[facility]   [address] °[place], [place] [phone]     °[facility] [address], [place], [place] [phone] Insurance/Medicaid/sponsorship through [facility]  °[facility] [address]                                    [place], [place] [phone] Therapy/tele-psych/case  °[facility] [address]  ° [place], [place] [phone]    °Dr. Arfeen  ([phone]   °[facility] [place]  [employer] [place] [facility] 1) [address], [place] °2) [address], [place] °3)  [address], [place] [phone]) [phone] °([phone]) [phone] ° °([phone]) [phone]   °[place] Child Abuse Hotline (336) 342-1394 or (336) [phone] (After Hours)    ° ° ° °

## 2013-11-07 NOTE — ED Notes (Signed)
Pt ambulated with pulse ox. Pts O2 stats remained at 96%.

## 2013-11-07 NOTE — ED Provider Notes (Signed)
CSN: [id number]     Arrival date & time 11/07/13  1336 History   First MD Initiated Contact with Patient 11/07/13 1341     Chief Complaint  Patient presents with  . Shortness of Breath  . Asthma   (Consider location/radiation/quality/duration/timing/severity/associated sxs/prior Treatment) HPI Comments: Patient is a 53 year old female with history of asthma who presents today with 3 days of progressive worsening shortness of breath. She reports that initially it was a scratchy throat. Yesterday she began to have increased need for her albuterol inhaler. She has been wheezing. Today she began to feel nauseous. She has not vomited. She has generalized body aches. She has sick contacts with the flu. She has been hospitalized for her asthma in the past. She was intubated many years ago.  Patient is a 53 y.o. female presenting with shortness of breath and asthma. The history is provided by the patient. No language interpreter was used.  Shortness of Breath Associated symptoms: cough and wheezing   Associated symptoms: no abdominal pain, no fever and no vomiting   Asthma Associated symptoms include coughing, myalgias and nausea. Pertinent negatives include no abdominal pain, chills, fever or vomiting.    Past Medical History  Diagnosis Date  . Asthma     Started as a child; Has been intubated for asthma exacerbation twice in approx 1990  . Shortness of breath     "related to asthma attacks" (09/19/2012)   Past Surgical History  Procedure Laterality Date  . Hand      flexor tendon repair  . Tubal ligation  ~ 1985   Family History  Problem Relation Age of Onset  . Asthma Mother   . Heart disease Father 12    MI  . Alcohol abuse Father   . Asthma Sister 9    death from asthma   History  Substance Use Topics  . Smoking status: Never Smoker   . Smokeless tobacco: Never Used  . Alcohol Use: No   OB History   Grav Para Term Preterm Abortions TAB SAB Ect Mult Living                  Review of Systems  Constitutional: Negative for fever and chills.  Respiratory: Positive for cough, chest tightness, shortness of breath and wheezing.   Gastrointestinal: Positive for nausea. Negative for vomiting and abdominal pain.  Musculoskeletal: Positive for myalgias.  All other systems reviewed and are negative.    Allergies  Review of patient's allergies indicates no known allergies.  Home Medications   Current Outpatient Rx  Name  Route  Sig  Dispense  Refill  . albuterol (PROVENTIL HFA;VENTOLIN HFA) 108 (90 BASE) MCG/ACT inhaler   Inhalation   Inhale into the lungs every 6 (six) hours as needed for wheezing or shortness of breath.         [name] Kitchen albuterol (PROVENTIL) (5 MG/ML) 0.5% nebulizer solution   Nebulization   Take 2.5 mg by nebulization every 6 (six) hours as needed for wheezing or shortness of breath.         . Biotin 1000 MCG tablet   Oral   Take 1,000 mcg by mouth daily.         . Coenzyme Q10 (CO Q 10 PO)   Oral   Take 1 tablet by mouth daily.         . Cyanocobalamin (VITAMIN B 12 PO)   Sublingual   Place 1 tablet under the tongue daily.         [name] Kitchen  ipratropium-albuterol (DUONEB) 0.5-2.5 (3) MG/3ML SOLN   Nebulization   Take 3 mLs by nebulization every 4 (four) hours as needed (for shortness of breath).         . loratadine (CLARITIN) 10 MG tablet   Oral   Take 10 mg by mouth daily.         . mometasone-formoterol (DULERA) 200-5 MCG/ACT AERO   Inhalation   Inhale 2 puffs into the lungs 2 (two) times daily.   1 Inhaler   6   . montelukast (SINGULAIR) 10 MG tablet   Oral   Take 1 tablet (10 mg total) by mouth at bedtime.   30 tablet   11   . Multiple Vitamin (MULITIVITAMIN WITH MINERALS) TABS   Oral   Take 1 tablet by mouth daily.         . Probiotic Product (PRO-BIOTIC BLEND PO)   Oral   Take 1 tablet by mouth daily.          BP 124/77  Pulse 109  Temp(Src) 98.3 F (36.8 C) (Oral)  Resp 18  SpO2 96% Physical  Exam  Nursing note and vitals reviewed. Constitutional: She is oriented to person, place, and time. She appears well-developed and well-nourished. She does not appear ill. No distress.  Resting comfortably in bed  HENT:  Head: Normocephalic and atraumatic.  Right Ear: External ear normal.  Left Ear: External ear normal.  Nose: Nose normal.  Mouth/Throat: Oropharynx is clear and moist.  Eyes: Conjunctivae are normal.  Neck: Normal range of motion.  Cardiovascular: Normal rate, regular rhythm and normal heart sounds.   Pulmonary/Chest: Effort normal. No stridor. No respiratory distress. She has wheezes. She has no rales.  Speaking easily in full sentences  Abdominal: Soft. She exhibits no distension.  Musculoskeletal: Normal range of motion.  Neurological: She is alert and oriented to person, place, and time. She has normal strength.  Skin: Skin is warm and dry. She is not diaphoretic. No erythema.  Psychiatric: She has a normal mood and affect. Her behavior is normal.    ED Course  Procedures (including critical care time) Labs Review Labs Reviewed - No data to display Imaging Review Dg Chest 2 View  11/07/2013   CLINICAL DATA:  Shortness of breath, asthma  EXAM: CHEST  2 VIEW  COMPARISON:  06/26/2013  FINDINGS: Hyperinflation suggests COPD. The heart size and vascular pattern are normal. There are mildly prominent interstitial changes in the perihilar areas bilaterally, and there is mild perihilar peribronchial wall thickening bilaterally. There is no consolidation or pleural effusion.  IMPRESSION: No evidence of pneumonia. There is bilateral bronchitic change on a background of COPD.   Electronically Signed   By: Esperanza Heiraymond  Rubner M.D.   On: 11/07/2013 15:26    EKG Interpretation   None       MDM   1. Asthma    Patient ambulated in ED with O2 saturations maintained >90, no current signs of respiratory distress. Lung exam improved after nebulizer treatment. Prednisone given in  the ED and pt will bd dc with 5 day burst. Pt states they are breathing at baseline. Pt has been instructed to continue using prescribed medications and to speak with PCP about today's exacerbation.      Mora BellmanHannah S Aviona Martenson, PA-C 11/07/13 [phone]

## 2014-02-03 ENCOUNTER — Ambulatory Visit: Payer: 59

## 2014-02-04 ENCOUNTER — Ambulatory Visit: Payer: 59

## 2014-02-12 ENCOUNTER — Ambulatory Visit: Payer: 59

## 2014-02-12 ENCOUNTER — Encounter: Payer: Self-pay | Admitting: Family Medicine

## 2014-02-12 ENCOUNTER — Ambulatory Visit (INDEPENDENT_AMBULATORY_CARE_PROVIDER_SITE_OTHER): Payer: Self-pay | Admitting: Family Medicine

## 2014-02-12 VITALS — BP 129/84 | HR 81 | Temp 98.8°F | Ht 66.0 in | Wt 184.0 lb

## 2014-02-12 DIAGNOSIS — J45909 Unspecified asthma, uncomplicated: Secondary | ICD-10-CM

## 2014-02-12 DIAGNOSIS — R21 Rash and other nonspecific skin eruption: Secondary | ICD-10-CM

## 2014-02-12 LAB — POCT SKIN KOH: Skin KOH, POC: NEGATIVE

## 2014-02-12 MED ORDER — PREDNISONE 20 MG PO TABS: 40.0000 mg | ORAL_TABLET | Freq: Every day | ORAL | Status: DC

## 2014-02-12 NOTE — Patient Instructions (Addendum)
Please take prednisone 40mg  daily for 5 days. If at that point you symptoms have not improved start taper as follows: Day 6, 20 mg (1 tab) for 2 days, then 1/2 tab for 2 days, then every other day and then stop.  For your skin lesion we contact you with results and discuss plan at that point.

## 2014-02-12 NOTE — Progress Notes (Signed)
Family Medicine Office Visit Note   Subjective:   Patient ID: Lydia Griffin, female  DOB: 07-17-1961, 53 y.o.. MRN: [id number]   Pt that comes today for same day appointment complaining about asthma exacerbation and skin lesion on her neck. Asthma exacerbation: started 3 days ago with wheezing, she has been using Albuterol Q6h PRN with only mild resolution of symptoms. Denies, fever, chills, productive cough or systemic symptoms.  Skin: neck rash that pt notices it worsens when she has asthma attack. This is reported as itchy and red. Past KOH studies of this lesion have been negative.  No history of sick contact or other areas involved.  Review of Systems:  Per HPI  Objective:   Physical Exam: Gen:  NAD HEENT: Moist mucous membranes  CV: Regular rate and rhythm, no murmurs rubs or gallops PULM: Good air movement. Wheezes  present bilaterally, no rales. ABD: Soft, non tender, non distended, normal bowel sounds EXT: No edema SKIN: circular rash with well defines borders ~ 6cm diameter in anterior aspect of neck.  Neuro: Alert and oriented x3. No focalization  Assessment & Plan:

## 2014-02-13 DIAGNOSIS — R21 Rash and other nonspecific skin eruption: Secondary | ICD-10-CM | POA: Insufficient documentation

## 2014-02-13 NOTE — Assessment & Plan Note (Addendum)
Now on exacerbation. Good O2 sats and good air movement. No rales on exam.   Will start prednisone course and continue albuterol Q4-6h PRN. Discussed signs that should prompt emergent re-evaluation. She is agreeable with plan and f/u as needed.

## 2014-02-13 NOTE — Assessment & Plan Note (Signed)
Will re-obtain KOH and will treat pending on results.

## 2014-02-15 ENCOUNTER — Observation Stay (HOSPITAL_COMMUNITY): Payer: Self-pay

## 2014-02-15 ENCOUNTER — Encounter (HOSPITAL_COMMUNITY): Payer: Self-pay | Admitting: Emergency Medicine

## 2014-02-15 ENCOUNTER — Observation Stay (HOSPITAL_COMMUNITY)
Admission: EM | Admit: 2014-02-15 | Discharge: 2014-02-18 | Disposition: A | Discharge status: Home / Self Care | Payer: Self-pay | Attending: Family Medicine | Admitting: Family Medicine

## 2014-02-15 DIAGNOSIS — R5383 Other fatigue: Secondary | ICD-10-CM

## 2014-02-15 DIAGNOSIS — R5381 Other malaise: Secondary | ICD-10-CM

## 2014-02-15 DIAGNOSIS — K5900 Constipation, unspecified: Secondary | ICD-10-CM | POA: Insufficient documentation

## 2014-02-15 DIAGNOSIS — F339 Major depressive disorder, recurrent, unspecified: Secondary | ICD-10-CM | POA: Insufficient documentation

## 2014-02-15 DIAGNOSIS — E663 Overweight: Secondary | ICD-10-CM | POA: Insufficient documentation

## 2014-02-15 DIAGNOSIS — J4550 Severe persistent asthma, uncomplicated: Secondary | ICD-10-CM | POA: Diagnosis present

## 2014-02-15 DIAGNOSIS — A4902 Methicillin resistant Staphylococcus aureus infection, unspecified site: Secondary | ICD-10-CM | POA: Insufficient documentation

## 2014-02-15 DIAGNOSIS — Z9114 Patient's other noncompliance with medication regimen: Secondary | ICD-10-CM

## 2014-02-15 DIAGNOSIS — R21 Rash and other nonspecific skin eruption: Secondary | ICD-10-CM | POA: Diagnosis present

## 2014-02-15 DIAGNOSIS — J45901 Unspecified asthma with (acute) exacerbation: Principal | ICD-10-CM | POA: Diagnosis present

## 2014-02-15 DIAGNOSIS — Z91199 Patient's noncompliance with other medical treatment and regimen due to unspecified reason: Secondary | ICD-10-CM | POA: Insufficient documentation

## 2014-02-15 DIAGNOSIS — G8929 Other chronic pain: Secondary | ICD-10-CM | POA: Insufficient documentation

## 2014-02-15 DIAGNOSIS — J45909 Unspecified asthma, uncomplicated: Secondary | ICD-10-CM | POA: Diagnosis present

## 2014-02-15 DIAGNOSIS — IMO0002 Reserved for concepts with insufficient information to code with codable children: Secondary | ICD-10-CM | POA: Insufficient documentation

## 2014-02-15 DIAGNOSIS — Z9119 Patient's noncompliance with other medical treatment and regimen: Secondary | ICD-10-CM | POA: Insufficient documentation

## 2014-02-15 MED ORDER — ACETAMINOPHEN 325 MG PO TABS: 650.0000 mg | ORAL_TABLET | Freq: Four times a day (QID) | ORAL | Status: DC | PRN

## 2014-02-15 MED ORDER — SODIUM CHLORIDE 0.9 % IJ SOLN
3.0000 mL | Freq: Two times a day (BID) | INTRAMUSCULAR | Status: DC
  Administered 2014-02-16 – 2014-02-18 (×5): 3 mL via INTRAVENOUS

## 2014-02-15 MED ORDER — DOCUSATE SODIUM 100 MG PO CAPS
100.0000 mg | ORAL_CAPSULE | Freq: Two times a day (BID) | ORAL | Status: DC
  Administered 2014-02-16 – 2014-02-18 (×6): 100 mg via ORAL
  Filled 2014-02-15 (×6): qty 1

## 2014-02-15 MED ORDER — ACETAMINOPHEN 650 MG RE SUPP: 650.0000 mg | Freq: Four times a day (QID) | RECTAL | Status: DC | PRN

## 2014-02-15 MED ORDER — SODIUM CHLORIDE 0.9 % IV SOLN: 250.0000 mL | INTRAVENOUS | Status: DC | PRN

## 2014-02-15 MED ORDER — HEPARIN SODIUM (PORCINE) 5000 UNIT/ML IJ SOLN
5000.0000 [IU] | Freq: Three times a day (TID) | INTRAMUSCULAR | Status: DC
  Administered 2014-02-16 – 2014-02-18 (×8): 5000 [IU] via SUBCUTANEOUS
  Filled 2014-02-15 (×14): qty 1

## 2014-02-15 MED ORDER — ONDANSETRON HCL 4 MG PO TABS: 4.0000 mg | ORAL_TABLET | Freq: Four times a day (QID) | ORAL | Status: DC | PRN

## 2014-02-15 MED ORDER — MONTELUKAST SODIUM 10 MG PO TABS
10.0000 mg | ORAL_TABLET | Freq: Every day | ORAL | Status: DC
  Administered 2014-02-16 – 2014-02-17 (×3): 10 mg via ORAL
  Filled 2014-02-15 (×6): qty 1

## 2014-02-15 MED ORDER — ALBUTEROL SULFATE (2.5 MG/3ML) 0.083% IN NEBU
2.5000 mg | INHALATION_SOLUTION | RESPIRATORY_TRACT | Status: DC | PRN
  Administered 2014-02-16: 2.5 mg via RESPIRATORY_TRACT
  Filled 2014-02-15: qty 3

## 2014-02-15 MED ORDER — MOMETASONE FURO-FORMOTEROL FUM 200-5 MCG/ACT IN AERO
2.0000 | INHALATION_SPRAY | Freq: Two times a day (BID) | RESPIRATORY_TRACT | Status: DC
  Administered 2014-02-16 – 2014-02-17 (×3): 2 via RESPIRATORY_TRACT
  Filled 2014-02-15 (×3): qty 8.8

## 2014-02-15 MED ORDER — IPRATROPIUM BROMIDE 0.02 % IN SOLN
0.5000 mg | Freq: Once | RESPIRATORY_TRACT | Status: AC
  Administered 2014-02-15: 0.5 mg via RESPIRATORY_TRACT
  Filled 2014-02-15: qty 2.5

## 2014-02-15 MED ORDER — ONDANSETRON HCL 4 MG/2ML IJ SOLN: 4.0000 mg | Freq: Four times a day (QID) | INTRAMUSCULAR | Status: DC | PRN

## 2014-02-15 MED ORDER — PREDNISONE 20 MG PO TABS
40.0000 mg | ORAL_TABLET | Freq: Every day | ORAL | Status: DC
  Administered 2014-02-16 – 2014-02-17 (×2): 40 mg via ORAL
  Filled 2014-02-15 (×2): qty 2

## 2014-02-15 MED ORDER — ALBUTEROL (5 MG/ML) CONTINUOUS INHALATION SOLN
15.0000 mg/h | INHALATION_SOLUTION | RESPIRATORY_TRACT | Status: AC
  Administered 2014-02-15: 15 mg/h via RESPIRATORY_TRACT

## 2014-02-15 MED ORDER — ALBUTEROL (5 MG/ML) CONTINUOUS INHALATION SOLN
15.0000 mg/h | INHALATION_SOLUTION | RESPIRATORY_TRACT | Status: AC
  Administered 2014-02-15: 15 mg/h via RESPIRATORY_TRACT
  Filled 2014-02-15 (×2): qty 20

## 2014-02-15 MED ORDER — SODIUM CHLORIDE 0.9 % IJ SOLN: 3.0000 mL | INTRAMUSCULAR | Status: DC | PRN

## 2014-02-15 MED ORDER — DEXAMETHASONE SODIUM PHOSPHATE 10 MG/ML IJ SOLN
10.0000 mg | Freq: Once | INTRAMUSCULAR | Status: AC
  Administered 2014-02-15: 10 mg via INTRAVENOUS
  Filled 2014-02-15: qty 1

## 2014-02-15 MED ORDER — ALBUTEROL SULFATE (2.5 MG/3ML) 0.083% IN NEBU
2.5000 mg | INHALATION_SOLUTION | RESPIRATORY_TRACT | Status: DC
  Administered 2014-02-16: 2.5 mg via RESPIRATORY_TRACT
  Filled 2014-02-15: qty 3

## 2014-02-15 MED ORDER — MORPHINE SULFATE 2 MG/ML IJ SOLN: 1.0000 mg | INTRAMUSCULAR | Status: DC | PRN

## 2014-02-15 NOTE — ED Notes (Signed)
Flow called to inquire about bed request.  

## 2014-02-15 NOTE — ED Provider Notes (Signed)
CSN: [id number]     Arrival date & time 02/15/14  1704 History   None    This chart was scribed for non-physician practitioner, Ebbie Ridgehris Azam Gervasi PA-C working with Ward GivensIva L Knapp, MD by Arlan OrganAshley Leger, ED Scribe. This patient was seen in room TR10C/TR10C and the patient's care was started at 5:12 PM.   No chief complaint on file.  The history is provided by the patient. No language interpreter was used.    HPI Comments: Lydia Griffin is a 53 y.o. Female with a PMHx of Asthma who presents to the Emergency Department complaining of asthma exacerbation and SOB x 2 days. She states her current SOB is worsened with ambulation and somewhat alleviated at rest. Pt has tried albuterol nebulizer treatments at home with no improvement. States she was seen by her PCP for same and prescribed Prednisone 40 mg/5 days. She states she is currently on her 3rd dose with no noticeable improvement. At this time she denies any fever or chills. She has no other pertinent medical history. No other concerns this visit.  [facility]  Past Medical History  Diagnosis Date  . Asthma     Started as a child; Has been intubated for asthma exacerbation twice in approx 1990  . Shortness of breath     "related to asthma attacks" (09/19/2012)   Past Surgical History  Procedure Laterality Date  . Hand      flexor tendon repair  . Tubal ligation  ~ 1985   Family History  Problem Relation Age of Onset  . Asthma Mother   . Heart disease Father 5264    MI  . Alcohol abuse Father   . Asthma Sister 9    death from asthma   History  Substance Use Topics  . Smoking status: Never Smoker   . Smokeless tobacco: Never Used  . Alcohol Use: No   OB History   Grav Para Term Preterm Abortions TAB SAB Ect Mult Living                 Review of Systems  Constitutional: Negative for fever and chills.  HENT: Negative for congestion.   Eyes: Negative for redness.  Respiratory: Positive for shortness of breath.    Skin: Negative for rash.  Psychiatric/Behavioral: Negative for confusion.      Allergies  Review of patient's allergies indicates no known allergies.  Home Medications   Prior to Admission medications   Medication Sig Start Date End Date Taking? Authorizing Provider  albuterol (PROVENTIL HFA;VENTOLIN HFA) 108 (90 BASE) MCG/ACT inhaler Inhale into the lungs every 6 (six) hours as needed for wheezing or shortness of breath.    Historical Provider, MD  albuterol (PROVENTIL HFA;VENTOLIN HFA) 108 (90 BASE) MCG/ACT inhaler Inhale 2 puffs into the lungs every 2 (two) hours as needed for wheezing or shortness of breath (cough). 11/07/13   Mora BellmanHannah S Merrell, PA-C  albuterol (PROVENTIL) (5 MG/ML) 0.5% nebulizer solution Take 2.5 mg by nebulization every 6 (six) hours as needed for wheezing or shortness of breath.    Historical Provider, MD  Biotin 1000 MCG tablet Take 1,000 mcg by mouth daily.    Historical Provider, MD  Coenzyme Q10 (CO Q 10 PO) Take 1 tablet by mouth daily.    Historical Provider, MD  Cyanocobalamin (VITAMIN B 12 PO) Place 1 tablet under the tongue daily.    Historical Provider, MD  ipratropium-albuterol (DUONEB) 0.5-2.5 (3) MG/3ML SOLN Take 3 mLs by nebulization  every 4 (four) hours as needed (for shortness of breath).    Historical Provider, MD  loratadine (CLARITIN) 10 MG tablet Take 10 mg by mouth daily.    Historical Provider, MD  mometasone-formoterol (DULERA) 200-5 MCG/ACT AERO Inhale 2 puffs into the lungs 2 (two) times daily. 06/29/13   Dow Adolphichard Kazibwe, MD  mometasone-formoterol [facility](DULERA) 200-5 MCG/ACT AERO Inhale 2 puffs into the lungs once. 11/07/13   Mora BellmanHannah S Merrell, PA-C  montelukast (SINGULAIR) 10 MG tablet Take 1 tablet (10 mg total) by mouth at bedtime. 09/16/12   Macy MisKim K Briscoe, MD  montelukast (SINGULAIR) 10 MG tablet Take 1 tablet (10 mg total) by mouth at bedtime. 11/07/13   Mora BellmanHannah S Merrell, PA-C  Multiple Vitamin (MULITIVITAMIN WITH MINERALS) TABS Take 1 tablet by  mouth daily.    Historical Provider, MD  predniSONE (DELTASONE) 20 MG tablet Take 2 tablets (40 mg total) by mouth daily. 02/12/14   Dayarmys Piloto de Criselda PeachesLa Paz, MD  Probiotic Product (PRO-BIOTIC BLEND PO) Take 1 tablet by mouth daily.    Historical Provider, MD   Triage Vitals: BP 117/61  Pulse 91  Temp(Src) 97.8 F (36.6 C) (Oral)  Resp 20  SpO2 97%   Physical Exam  Nursing note and vitals reviewed. Constitutional: She is oriented to person, place, and time. She appears well-developed and well-nourished.  HENT:  Head: Normocephalic and atraumatic.  Eyes: EOM are normal.  Neck: Normal range of motion.  Cardiovascular: Normal rate.   Pulmonary/Chest: Effort normal.  Musculoskeletal: Normal range of motion.  Neurological: She is alert and oriented to person, place, and time.  Skin: Skin is warm and dry.  Psychiatric: She has a normal mood and affect. Her behavior is normal.    ED Course  Procedures (including critical care time)  DIAGNOSTIC STUDIES: Oxygen Saturation is 97% on RA, Normal by my interpretation.    COORDINATION OF CARE: 5:25 PM- Will give albuterol, Atrovent, and Decadron. Discussed treatment plan with pt at bedside and pt agreed to plan.     Patient will be admitted to the hospital for further evaluation.  Based on the fact the patient is still significantly wheezing and and has a drop in her pulse oximetry on ambulation  I personally performed the services described in this documentation, which was scribed in my presence. The recorded information has been reviewed and is accurate.    Carlyle Dollyhristopher W Carlita Whitcomb, PA-C 02/25/14 [phone]

## 2014-02-15 NOTE — ED Provider Notes (Signed)
Patient has a history of reactive airway disease. She reports she's been having a flareup the whole week. She was seen by her PCP and has had 3 days of prednisone without improvement. She states if she exerts herself her wheezing gets a lot worse. She denies any coughing up mucus or fever.  Patient was examined after her continuous nebulizer. She still has diffuse expiratory rhonchi and few scattered wheezing. She however is not having retractions.  Medical screening examination/treatment/procedure(s) were conducted as a shared visit with non-physician practitioner(s) and myself.  I personally evaluated the patient during the encounter.   EKG Interpretation None       Plan discharge   Ward GivensIva L Cyncere Sontag, MD 02/15/14 1949

## 2014-02-15 NOTE — ED Notes (Signed)
Pt presents to department for evaluation of asthma exacerbation. Pt states SOB x2 days. No relief with albuterol nebulizer treatments at home. Also states she has been taking prednisone with no relief. Respirations unlabored. Speaking complete sentences. Pt is alert and oriented x4. NAD.

## 2014-02-15 NOTE — ED Notes (Signed)
This RN ambulated pt, pt's 02 sats read 93% and HR was 113 during ambulation. Pt states she felt like she was starting to struggle at the end of ambulation however pt states she feels better walking now than she did earlier.

## 2014-02-15 NOTE — H&P (Signed)
[facility] Physical Service Pager: [phone]  Patient name: Lydia Griffin Medical record number: [id number] Date of birth: [birth year] Age: 53 y.o. Gender: female  Primary Care Provider: Lora PaulaFUNCHES, JOSALYN C, MD Consultants: none Code Status: Full  Chief Complaint: SOB  Assessment and Plan: Lydia Griffin is a 53 y.o. female presenting with shortness of breath x1 week, worse with the weather, despite use of asthma medications and prednisone; pt was seen in clinic about 5 days ago for similar symptoms. PMH most significant for persistent asthma, with hx of difficulty affording meds in the past. S/p Decadron and CAT x2 in the ED (second hour started during my interview).  #Asthma exacerbation - most likely secondary to weather; no red flags per history or exam to suggest significant infection - continue second hour of CAT to completion, then ordered for nebulizers q4 with q2 PRN - continue home Dulera,  - continue Prednisone 40 mg daily (4/26 is day 4 of previously planned 10 from outpt Rx) - may need higher dose of steroids and/or prolonged taper - monitor overnight and increase tomorrow if needed - consider azithromycin for atypical infection, but will hold off for now (no fevers, general non-toxic appearance) - will get CXR tonight and treat any findings, as appropriate  #Skin rash to neck - negative KOH in clinic, not red / raised, itching, bleeding or draining to suggest superinfection - appears to be atopic dermatitis flaring with asthma, but need to consider other etiology especially if persists or worsens - monitor while on prednisone PO; consider topical agent  # Other: No current complaints of N/V or pain but has has chronic back pain in the past - ordered for Zofran PRN, Tylenol for mild pain, morphine for severe pain - Colace for constipation  FEN/GI: regular diet, saline lock IV Prophylaxis: subQ heparin  Disposition:  management as above; place in observation in stepdown (not floor, for CAT), attending Dr. Armen PickupFunches  History of Present Illness: Lydia Griffin is a 53 y.o. female presenting with shortness of breath x1 week, worse with the weather, despite use of asthma medications and prednisone; pt was seen in clinic about 5 days ago for similar symptoms. PMH most significant for persistent asthma, with hx of difficulty affording meds in the past. Pt states she has been compliant with medications but has been having worse SOB for at least 7 days, worse for the last two with the rainy, wet weather. She states weather has been a trigger for her asthma since she was a little girl. She denies productive cough, fever, N/V, abdominal or chest pain. She does have some constipation, but she feels this is unrelated to her breathing and current symptoms. She does have a rash around her neck in the front, which was checked at the clinic last week and was not ringworm; her rash has gotten some better with prednisone. She states she has never connected the two before, but feels like she has gotten a similar rash that coincides with flares of asthma, and her rash tends to be worse when her asthma is worse.  Of note, her prednisone was ordered for 40 mg for 10 days starting 4/23 (4/26 is day 4).  Review Of Systems: Per HPI. Otherwise 12 point review of systems was performed and was unremarkable.  Patient Active Problem List   Diagnosis Date Noted  . Rash and nonspecific skin eruption 02/13/2014  . Recurrent vaginitis 06/29/2013  . Insomnia 12/30/2012  . Noncompliance  with medication treatment due to underuse of medication 12/16/2012  . Asthma exacerbation 08/15/2011  . FATIGUE 08/09/2010  . ASTHMA, PERSISTENT 11/02/2009  . Overweight 12/20/2006  . 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 Asthma     Started as a child; Has been intubated for asthma exacerbation  twice in approx 1990  . Shortness of breath     "related to asthma attacks" (09/19/2012)   Past Surgical History: Past Surgical History  Procedure Laterality Date  . Hand      flexor tendon repair  . Tubal ligation  ~ 1985   Social History: History  Substance Use Topics  . Smoking status: Never Smoker   . Smokeless tobacco: Never Used  . Alcohol Use: No   Please also refer to relevant sections of EMR.  Family History: Family History  Problem Relation Age of Onset  . Asthma Mother   . Heart disease Father 5364    MI  . Alcohol abuse Father   . Asthma Sister 9    death from asthma   Allergies and Medications: No Known Allergies No current facility-administered medications on file prior to encounter.   Current Outpatient Prescriptions on File Prior to Encounter  Medication Sig Dispense Refill  . albuterol (PROVENTIL HFA;VENTOLIN HFA) 108 (90 BASE) MCG/ACT inhaler Inhale 2 puffs into the lungs every 2 (two) hours as needed for wheezing or shortness of breath (cough).  1 Inhaler  0  . albuterol (PROVENTIL) (5 MG/ML) 0.5% nebulizer solution Take 2.5 mg by nebulization every 6 (six) hours as needed for wheezing or shortness of breath.      . Biotin 1000 MCG tablet Take 1,000 mcg by mouth daily.      . Coenzyme Q10 (CO Q 10 PO) Take 1 tablet by mouth daily.      . Cyanocobalamin (VITAMIN B 12 PO) Place 1 tablet under the tongue daily.      . mometasone-formoterol (DULERA) 200-5 MCG/ACT AERO Inhale 2 puffs into the lungs 2 (two) times daily.  1 Inhaler  6  . montelukast (SINGULAIR) 10 MG tablet Take 1 tablet (10 mg total) by mouth at bedtime.  30 tablet  11  . Multiple Vitamin (MULITIVITAMIN WITH MINERALS) TABS Take 1 tablet by mouth daily.      . predniSONE (DELTASONE) 20 MG tablet Take 2 tablets (40 mg total) by mouth daily.  20 tablet  0  . Probiotic Product (PRO-BIOTIC BLEND PO) Take 1 tablet by mouth daily.        Objective: BP 117/61  Pulse 94  Temp(Src) 97.8 F (36.6  C) (Oral)  Resp 18  SpO2 98% Exam: General: uncomfortable but non-toxic appearing adult female in NAD; speaks in full sentences but gets winded easily HEENT: [place]/AT, EOMI, PERRLA, TM's clear bilaterally Cardiovascular: RRR, no murmur appreciated Respiratory: loud, coarse, diffuse wheezes, inspiratory and expiratory, with mildly increased WOB Abdomen: soft, nontender, BS+ Extremities: warm, well-perfused, no LE edema Skin: dry, slightly scaly skin to anterior neck in a scarf-like pattern; otherwise warm, intact Neuro: alert / oriented, no gross focal deficits  Labs and Imaging: CBC BMET  No results found for this basename: WBC, HGB, HCT, PLT,  in the last 168 hours No results found for this basename: NA, K, CL, CO2, BUN, CREATININE, GLUCOSE, CALCIUM,  in the last 168 hours   No labs drawn in the ED. CBC and BMP pending.  Stephanie Couphristopher M Street, MD 02/15/2014, 8:49 PM  PGY-2, [place] Intern pager: [phone], text pages welcome

## 2014-02-15 NOTE — ED Notes (Signed)
Attempted to give report 

## 2014-02-16 LAB — CBC
HEMATOCRIT: 38.6 % (ref 36.0–46.0)
Hemoglobin: 13 g/dL (ref 12.0–15.0)
MCH: 28.6 pg (ref 26.0–34.0)
MCHC: 33.7 g/dL (ref 30.0–36.0)
MCV: 85 fL (ref 78.0–100.0)
Platelets: 305 10*3/uL (ref 150–400)
RBC: 4.54 MIL/uL (ref 3.87–5.11)
RDW: 14.4 % (ref 11.5–15.5)
WBC: 6.5 10*3/uL (ref 4.0–10.5)

## 2014-02-16 LAB — MRSA PCR SCREENING: MRSA by PCR: POSITIVE — AB

## 2014-02-16 LAB — BASIC METABOLIC PANEL
BUN: 12 mg/dL (ref 6–23)
CO2: 20 mEq/L (ref 19–32)
Calcium: 9.3 mg/dL (ref 8.4–10.5)
Chloride: 100 mEq/L (ref 96–112)
Creatinine, Ser: 0.7 mg/dL (ref 0.50–1.10)
GFR calc non Af Amer: >90 mL/min (ref >90)
Glucose, Bld: 143 mg/dL — ABNORMAL HIGH (ref 70–99)
Potassium: 3.8 mEq/L (ref 3.7–5.3)
Sodium: 136 mEq/L — ABNORMAL LOW (ref 137–147)

## 2014-02-16 MED ORDER — TRAZODONE HCL 100 MG PO TABS
100.0000 mg | ORAL_TABLET | Freq: Once | ORAL | Status: AC
  Administered 2014-02-16: 100 mg via ORAL
  Filled 2014-02-16: qty 1

## 2014-02-16 MED ORDER — PREDNISONE 20 MG PO TABS: 40.0000 mg | ORAL_TABLET | Freq: Every day | ORAL | Status: DC

## 2014-02-16 MED ORDER — TRAZODONE HCL 100 MG PO TABS
100.0000 mg | ORAL_TABLET | Freq: Once | ORAL | Status: AC
Start: 2014-02-16 — End: 2014-02-16
  Administered 2014-02-16: 100 mg via ORAL
  Filled 2014-02-16: qty 1

## 2014-02-16 MED ORDER — ALBUTEROL SULFATE (2.5 MG/3ML) 0.083% IN NEBU
2.5000 mg | INHALATION_SOLUTION | Freq: Three times a day (TID) | RESPIRATORY_TRACT | Status: DC
  Administered 2014-02-16 – 2014-02-17 (×4): 2.5 mg via RESPIRATORY_TRACT
  Filled 2014-02-16 (×4): qty 3

## 2014-02-16 MED ORDER — LORATADINE 10 MG PO TABS
10.0000 mg | ORAL_TABLET | Freq: Every day | ORAL | Status: DC
  Administered 2014-02-16 – 2014-02-18 (×3): 10 mg via ORAL
  Filled 2014-02-16 (×3): qty 1

## 2014-02-16 MED ORDER — MUPIROCIN 2 % EX OINT
1.0000 "application " | TOPICAL_OINTMENT | Freq: Two times a day (BID) | CUTANEOUS | Status: DC
  Administered 2014-02-16 – 2014-02-18 (×6): 1 via NASAL
  Filled 2014-02-16 (×2): qty 22

## 2014-02-16 MED ORDER — LORATADINE 10 MG PO TABS
10.0000 mg | ORAL_TABLET | Freq: Every day | ORAL | Status: DC
Start: 2014-02-16 — End: 2019-07-14

## 2014-02-16 MED ORDER — MUPIROCIN 2 % EX OINT: 1.0000 "application " | TOPICAL_OINTMENT | Freq: Two times a day (BID) | CUTANEOUS | Status: DC

## 2014-02-16 MED ORDER — ALBUTEROL SULFATE HFA 108 (90 BASE) MCG/ACT IN AERS: 2.0000 | INHALATION_SPRAY | RESPIRATORY_TRACT | Status: DC | PRN

## 2014-02-16 MED ORDER — CHLORHEXIDINE GLUCONATE CLOTH 2 % EX PADS
6.0000 | MEDICATED_PAD | Freq: Every day | CUTANEOUS | Status: DC
  Administered 2014-02-16 – 2014-02-18 (×3): 6 via TOPICAL

## 2014-02-16 NOTE — Progress Notes (Signed)
Family Medicine Teaching Service Daily Progress Note Intern Pager: [phone]  Patient name: Lydia Griffin Medical record number: [id number] Date of birth: [birth year] Age: 53 y.o. Gender: female  Primary Care Provider: Lora PaulaFUNCHES, JOSALYN C, MD Consultants: none Code Status: full  Pt Overview and Major Events to Date:  4/27: off CAT, off O2  Assessment and Plan: Lydia OrganValerie O Stonerock is a 53 y.o. female presenting with shortness of breath x1 week, worse with the weather, despite use of asthma medications and prednisone; pt was seen in clinic about 5 days ago for similar symptoms. PMH most significant for persistent asthma, with hx of difficulty affording meds in the past. S/p Decadron and CAT x2 in the ED   #Asthma exacerbation - most likely secondary to weather; no red flags per history or exam to suggest significant infection; no fevers, no WBC - nebulizers q8 with q2 PRN  - continue home Dulera - continue Prednisone 40 mg daily (4/26 is day 4 of previously planned 10 from outpt Rx)  - consider azithromycin for atypical infection, but will hold off for now (no fevers, general non-toxic appearance)   #Skin rash to neck - negative KOH in clinic, not red / raised, itching, bleeding or draining to suggest superinfection  - appears to be atopic dermatitis flaring with asthma, but need to consider other etiology especially if persists or worsens  - monitor while on prednisone PO; consider topical agent   #MSRA- nasal swab -mupirocin nasal 1/2 tube BID X5 days  # FENGI: No current complaints of N/V or pain but has has chronic back pain in the past  - ordered for Zofran PRN, Tylenol for mild pain, morphine for severe pain  - Colace for constipation - reg diet; KVO  PPx: subq heparin  Disposition: improved respiratory status; likely d/c home today  Subjective: breathing comfortably, no increased WOB  Objective: Temp:  [97.8 F (36.6 C)-98.2 F (36.8 C)] 98.1 F (36.7 C) (04/27 0401) Pulse  Rate:  [73-99] 73 (04/27 0401) Resp:  [18-27] 18 (04/27 0401) BP: (112-144)/(55-82) 112/55 mmHg (04/27 0401) SpO2:  [93 %-100 %] 97 % (04/27 0401) Weight:  [186 lb 11.7 oz (84.7 kg)] 186 lb 11.7 oz (84.7 kg) (04/26 2244) Physical Exam: General: NAD; able to speak in full sentences  HEENT: [place]/AT, EOMI, PERRLA, TM's clear bilaterally  Cardiovascular: RRR, no murmur appreciated  Respiratory: diffuse wheezes, end expiratory wheezes, no signs of increased WOB  Abdomen: soft, nontender, BS+  Extremities: warm, well-perfused, no LE edema  Skin: dry, slightly scaly skin to anterior neck in a scarf-like pattern; otherwise warm, intact  Neuro: alert / oriented, no gross focal deficits   Laboratory:  Recent Labs Lab 02/16/14 0005  WBC 6.5  HGB 13.0  HCT 38.6  PLT 305    Recent Labs Lab 02/16/14 0005  NA 136*  K 3.8  CL 100  CO2 20  BUN 12  CREATININE 0.70  CALCIUM 9.3  GLUCOSE 143*    Imaging/Diagnostic Tests: 02/15/14 IMPRESSION:  Mild chronically increased interstitial markings seen. Lungs  otherwise clear.   Anselm LisMelanie Kaylor Maiers, MD 02/16/2014, 7:27 AM PGY-1, [facility] Family Medicine FPTS Intern pager: [phone], text pages welcome

## 2014-02-16 NOTE — Progress Notes (Signed)
Family Practice Teaching Service Interval Progress Note  Lydia FlesherWent to discuss discharge home with the patient. She stated she was ok with this, though when I listened to her lungs there were diffuse inspiratory and expiratory wheezes. She additionally became short of breath with this exam. She did not desat with this. Upon this happening I discussed with the patient that it would be beneficial for her to stay overnight to continue monitoring and treatment of her asthma exacerbation. Will transfer to the floor with potential to discharge home tomorrow.  Marikay AlarEric Sedale [name], MD Family Medicine PGY-2 Service Pager [phone]

## 2014-02-16 NOTE — Care Management Note (Signed)
    Page 1 of 1   02/16/2014     11:13:12 AM CARE MANAGEMENT NOTE 02/16/2014  Patient:  Lydia Griffin,Jennelle O   Account Number:  [id number]  Date Initiated:  02/16/2014  Documentation initiated by:  Donn PieriniWEBSTER,Alyrica Thurow  Subjective/Objective Assessment:   Pt admitted with asthma     Action/Plan:   PTA pt lived at home   Anticipated DC Date:  02/16/2014   Anticipated DC Plan:  HOME/SELF CARE      DC Planning Services  CM consult      Choice offered to / List presented to:             Status of service:  Completed, signed off Medicare Important Message given?   (If response is "NO", the following Medicare IM given date fields will be blank) Date Medicare IM given:   Date Additional Medicare IM given:    Discharge Disposition:  HOME/SELF CARE  Per UR Regulation:  Reviewed for med. necessity/level of care/duration of stay  If discussed at Long Length of Stay Meetings, dates discussed:    Comments:  02/16/14- 1100- Donn PieriniKristi Manjot Hinks RN, BSN [phone] Spoke with pt at bedside regarding medication needs- per conversation pt has been without a job and unable to afford all her meds- pt states that she is in process of getting job and will have insurance soon. She never did apply for Medicaid- she [facility] -and had an appointment last week with Vinetta BergamoBarbara M. there regarding orange card- encouraged pt to call and get that appointment rescheduled ASAP in order to be able to be assisted with medications. Pt reports that she has Dulera at home, and some nebs (she also has nebulizer machine) she buys some meds over the counter and reports that she should be able to get her prednisone which does not cost a lot. Main med that she can not afford is the singular- at this time CM is unable to assist with the Match program as pt has been assisted within the last 12 mo. (last claim showing 11/10/2013). Pt verbalizes understanding of this. Pt is to f/u with clinic to see if perhaps they may have some  samples of singular to give until she has insurance with her new job- also gave pt the assistance forms for singular through drug company to fill out and have her PCP sign to see if she may qualify for assistance.

## 2014-02-16 NOTE — Progress Notes (Signed)
Report called to charge nurse on 6N. Pt transported by wheelchair and NT to 6N25. All belongings sent with patient. Medications sent with patient. VSS. eICU and CCMT notified of transfer. Tele DC.

## 2014-02-16 NOTE — Discharge Summary (Signed)
Family Medicine Teaching [facility] Discharge Summary  Patient name: Lydia Griffin Medical record number: [id number] Date of birth: 25-Nov-1960 Age: 53 y.o. Gender: female Date of Admission: 02/15/2014  Date of Discharge: 02/18/14 Admitting Physician: Lora Paula, MD  Primary Care Provider: Lora Paula, MD Consultants: none  Indication for Hospitalization: status asthmaticus  Discharge Diagnoses/Problem List:  -asthma exacerbation -skin rash -MRSA positive -chronic pain -constipation  Disposition: home  Discharge Condition: improved  Discharge Exam: BP 121/66  Pulse 57  Temp(Src) 97.5 F (36.4 C) (Oral)  Resp 18  Ht 5\' 6"  (1.676 m)  Wt 186 lb 11.7 oz (84.7 kg)  BMI 30.15 kg/m2  SpO2 98% General: NAD; able to speak in full sentences  HEENT: [place]/AT, EOMI, PERRLA, TM's clear bilaterally  Cardiovascular: RRR, no murmur appreciated  Respiratory: scattered inspiratory wheeze, no signs of increased WOB  Abdomen: soft, nontender, BS+  Extremities: warm, well-perfused, no LE edema  Skin: dry, slightly scaly skin to anterior neck in a scarf-like pattern; otherwise warm, intact  Neuro: alert / oriented, no gross focal deficits   Brief Hospital Course:  Lydia Griffin is a 53 y.o. female presenting with shortness of breath x1 week, worse with the weather, despite use of asthma medications and prednisone; pt was seen in clinic about 5 days ago for similar symptoms. In the ED pt initially given albuterol, atrovent and decadron, with O2 sat of 97% on RA. Pt then placed on CAT given diffuse wheezing, CBC without white count; CXR without infiltrate. On admission afebrile, well appearing, completed 2 hrs of CAT, was successfully transitioned over to neb albuterol per usual protocol. On day of discharge pt was sating well on RA, with some scattered inspiratory wheezes. Pt d/c'd home with dulera, singulair, and 6 additional days of PO steroids (with taper by 10 per day). Of note  was MRSA positive and will complete a 5 day course of mupirocin.   Issues for Follow Up:  1. Compliance with home medications; dulera, singular 2. Completion of steroid course/nasal ointment for MRSA  Significant Procedures: none  Significant Labs and Imaging:   Recent Labs Lab 02/16/14 0005  WBC 6.5  HGB 13.0  HCT 38.6  PLT 305    Recent Labs Lab 02/16/14 0005  NA 136*  K 3.8  CL 100  CO2 20  GLUCOSE 143*  BUN 12  CREATININE 0.70  CALCIUM 9.3   CXR 02/15/14  IMPRESSION:  Mild chronically increased interstitial markings seen. Lungs  otherwise clear.   Results/Tests Pending at Time of Discharge: None Discharge Medications:    Medication List         albuterol 108 (90 BASE) MCG/ACT inhaler  Commonly known as:  PROVENTIL HFA;VENTOLIN HFA  Inhale 2 puffs into the lungs every 4 (four) hours as needed for wheezing or shortness of breath (cough). every 4-6 hours for the next 24     albuterol (5 MG/ML) 0.5% nebulizer solution  Commonly known as:  PROVENTIL  Take 2.5 mg by nebulization every 6 (six) hours as needed for wheezing or shortness of breath.     Biotin 1000 MCG tablet  Take 1,000 mcg by mouth daily.     CO Q 10 PO  Take 1 tablet by mouth daily.     loratadine 10 MG tablet  Commonly known as:  CLARITIN  Take 1 tablet (10 mg total) by mouth daily.     mometasone-formoterol 200-5 MCG/ACT Aero  Commonly known as:  DULERA  Inhale 2  puffs into the lungs 2 (two) times daily.     montelukast 10 MG tablet  Commonly known as:  SINGULAIR  Take 1 tablet (10 mg total) by mouth at bedtime.     multivitamin with minerals Tabs tablet  Take 1 tablet by mouth daily.     mupirocin ointment 2 %  Commonly known as:  BACTROBAN  Place 1 application into the nose 2 (two) times daily.     predniSONE 20 MG tablet  Commonly known as:  DELTASONE  Take 3 tablets (60 mg total) by mouth daily. Then decrease by 10 every day for 5 days     PRO-BIOTIC BLEND PO  Take 1  tablet by mouth daily.     VITAMIN B 12 PO  Place 1 tablet under the tongue daily.        Discharge Instructions: Please refer to Patient Instructions section of EMR for full details.  Patient was counseled important signs and symptoms that should prompt return to medical care, changes in medications, dietary instructions, activity restrictions, and follow up appointments.   Follow-Up Appointments: Follow-up Information   Follow up with Lora Paula, MD On 02/27/2014. (@1 :30pm)    Specialty:  Family Medicine   Contact information:   [address] [place] [address] [phone]       Anselm Lis, MD 02/18/2014, 12:12 PM PGY-1, [facility] Family Medicine

## 2014-02-16 NOTE — H&P (Signed)
Attending Addendum  I examined the patient and discussed the assessment and plan with Dr. Casper Griffin. I have reviewed the note and agree.  Briefly, 53 yo F non smoker admitted for asthma exacerbation. Trigger most likely is damp weather.   Improved with CAT, decadron. Feeling great this AM.  Concerned about being MRSA swab positive. Has an aunt with lung cancer, chemo pending.   BP 135/63  Pulse 87  Temp(Src) 97.8 F (36.6 C) (Oral)  Resp 17  Ht 5\' 6"  (1.676 m)  Wt 186 lb 11.7 oz (84.7 kg)  BMI 30.15 kg/m2  SpO2 98% General appearance: alert, cooperative and no distress Neck: hyperpigmented atrophic rash R neck. (KOH negative on 02/12/14)  Lungs: normal WOB, speaking in full sentences, mild inspiratory wheezing b/l, no crackles.  Heart: regular rate and rhythm, S1, S2 normal, no murmur, click, rub or gallop  A/P:  Asthma exacerbation. Improved. Anticipate d/c home this PM as long as she looks as well as she does and tolerates ambulation. Treat MRSA with nasal mupirocin.  Add oral antihistamine to med regimen: she has dulera, Singulair and albuterol.  Neck rash consistent with atopic dermatitis improved s/p prednisone and decadron. 5 day steroid course, taper not needed, although patient should be instructed to call if she has return of respiratory symptoms or worsening neck rash.     Dessa PhiFUNCHES,Lydia Marcott, MD FAMILY MEDICINE TEACHING SERVICE

## 2014-02-16 NOTE — Progress Notes (Signed)
FMTS ATTENDING  NOTE Emmit Oriley,MD I have discussed this patient with the resident. I agree with the resident's findings, assessment and care plan.   

## 2014-02-16 NOTE — Progress Notes (Signed)
Physician notified: Family Medicine Resident At: 1604  Regarding: Planned DC? Awaiting return response.   Returned Response at: 1608  Order(s): Upper level will be by to see patient.

## 2014-02-16 NOTE — Progress Notes (Signed)
UR Completed.  Lydia BickerSarah Jane Quinci Griffin .tele 02/16/2014

## 2014-02-17 DIAGNOSIS — E663 Overweight: Secondary | ICD-10-CM

## 2014-02-17 DIAGNOSIS — Z9119 Patient's noncompliance with other medical treatment and regimen: Secondary | ICD-10-CM

## 2014-02-17 DIAGNOSIS — Z91199 Patient's noncompliance with other medical treatment and regimen due to unspecified reason: Secondary | ICD-10-CM

## 2014-02-17 MED ORDER — PREDNISONE 50 MG PO TABS
60.0000 mg | ORAL_TABLET | Freq: Every day | ORAL | Status: DC
  Administered 2014-02-18: 60 mg via ORAL
  Filled 2014-02-17: qty 1

## 2014-02-17 MED ORDER — ALBUTEROL SULFATE (2.5 MG/3ML) 0.083% IN NEBU
2.5000 mg | INHALATION_SOLUTION | RESPIRATORY_TRACT | Status: DC
  Administered 2014-02-17 – 2014-02-18 (×6): 2.5 mg via RESPIRATORY_TRACT
  Filled 2014-02-17 (×7): qty 3

## 2014-02-17 MED ORDER — PANTOPRAZOLE SODIUM 20 MG PO TBEC
20.0000 mg | DELAYED_RELEASE_TABLET | Freq: Every day | ORAL | Status: DC
  Administered 2014-02-17 – 2014-02-18 (×2): 20 mg via ORAL
  Filled 2014-02-17 (×2): qty 1

## 2014-02-17 MED ORDER — PREDNISONE 20 MG PO TABS
20.0000 mg | ORAL_TABLET | Freq: Once | ORAL | Status: AC
  Administered 2014-02-17: 20 mg via ORAL
  Filled 2014-02-17: qty 1

## 2014-02-17 MED ORDER — TRAZODONE HCL 100 MG PO TABS
100.0000 mg | ORAL_TABLET | Freq: Once | ORAL | Status: AC
  Administered 2014-02-17: 100 mg via ORAL
  Filled 2014-02-17: qty 1

## 2014-02-17 MED ORDER — MAGNESIUM HYDROXIDE 400 MG/5ML PO SUSP
30.0000 mL | Freq: Once | ORAL | Status: AC
  Administered 2014-02-17: 30 mL via ORAL
  Filled 2014-02-17: qty 30

## 2014-02-17 NOTE — Progress Notes (Signed)
Family Medicine Teaching Service Daily Progress Note Intern Pager: [phone]  Patient name: Lydia Griffin Medical record number: [id number] Date of birth: April 26, 1961 Age: 53 y.o. Gender: female  Primary Care Provider: Lora PaulaFUNCHES, JOSALYN C, MD Consultants: none Code Status: full  Pt Overview and Major Events to Date:  4/27: off CAT, off O2  Assessment and Plan: Lydia OrganValerie O Bynum is a 53 y.o. female presenting with shortness of breath x1 week, worse with the weather, despite use of asthma medications and prednisone; pt was seen in clinic about 5 days ago for similar symptoms. PMH most significant for persistent asthma, with hx of difficulty affording meds in the past. S/p Decadron and CAT x2 in the ED   #Asthma exacerbation - most likely secondary to weather; no red flags per history or exam to suggest significant infection; no fevers, no WBC, CXR reviewed again, largely unchanged from January, suspect patient transitioned/spaced too quickly - nebulizers q4 with q2 PRN; pt instructed to request q2 if still winded  - continue home Dulera - continue Prednisone, but will increase to 60 mg daily  - consider azithromycin for atypical infection, but will hold off for now (no fevers, general non-toxic appearance), if not improved by tomorrow may need to add   #Skin rash to neck - negative KOH in clinic, not red / raised, itching, bleeding or draining to suggest superinfection  - appears to be atopic dermatitis flaring with asthma, but need to consider other etiology especially if persists or worsens  - monitor while on prednisone PO; consider topical agent   #MSRA- nasal swab -mupirocin nasal 1/2 tube BID X5 days  # FENGI: No current complaints of N/V or pain but has has chronic back pain in the past  - ordered for Zofran PRN, Tylenol for mild pain, morphine for severe pain  - Colace for constipation - reg diet; KVO  PPx: subq heparin  Disposition: improved respiratory status; likely d/c home  tomorrow  Subjective: Wants to go home but feels extremely winded upon moving; knew it was going to be a bad day for her when she woke up wheezing  Objective: Temp:  [97.8 F (36.6 C)-98.6 F (37 C)] 98 F (36.7 C) (04/28 1246) Pulse Rate:  [64-89] 73 (04/28 1246) Resp:  [16-20] 19 (04/28 1246) BP: (117-137)/(67-77) 131/75 mmHg (04/28 1246) SpO2:  [93 %-99 %] 97 % (04/28 1246) Physical Exam: General: NAD; able to speak in full sentences but visibly winded on lengthy discussion HEENT: [place]/AT, EOMI, PERRL Cardiovascular: RRR, no murmur appreciated  Respiratory: diffuse wheezes, end expiratory wheezes, no signs of increased WOB  Abdomen: soft, nontender, BS+  Extremities: warm, well-perfused, no LE edema  Skin: dry, slightly scaly skin to anterior neck in a scarf-like pattern; otherwise warm, intact  Neuro: alert / oriented, no gross focal deficits  Laboratory:  Recent Labs Lab 02/16/14 0005  WBC 6.5  HGB 13.0  HCT 38.6  PLT 305    Recent Labs Lab 02/16/14 0005  NA 136*  K 3.8  CL 100  CO2 20  BUN 12  CREATININE 0.70  CALCIUM 9.3  GLUCOSE 143*    Imaging/Diagnostic Tests: 02/15/14 IMPRESSION:  Mild chronically increased interstitial markings seen. Lungs  otherwise clear.   Anselm LisMelanie Preston Weill, MD 02/17/2014, 2:37 PM PGY-1, [facility] Family Medicine FPTS Intern pager: [phone], text pages welcome

## 2014-02-17 NOTE — Progress Notes (Signed)
FMTS ATTENDING  NOTE Arsal Tappan,MD I  have seen and examined this patient, reviewed their chart. I have discussed this patient with the resident. I agree with the resident's findings, assessment and care plan.  Patient seen this morning,feels better with her cough,but still short winded with walking from her bed to the bathroom. Denies any chest tightness,recently had her breathing treatment.  Filed Vitals:   02/16/14 2041 02/16/14 2205 02/17/14 0532 02/17/14 1246  BP:  117/67 137/77 131/75  Pulse: 78 76 64 73  Temp:  98.1 F (36.7 C) 97.8 F (36.6 C) 98 F (36.7 C)  TempSrc:  Oral Oral Oral  Resp: 16 20 20 19   Height:      Weight:      SpO2:  98% 93% 97%   Exam: Gen: Patient with mild increase work of breathing,just walking back from the bathroom. HEENT: EOMI,PERRLA. Resp: Air entry equal B/L with diffused wheezing. CV; S1 S2 no murmur. Abd: Benign Ext: No edema.  52 Y/O with Asthma Exacerbation:   Not back to baseline.    Might need to go up on her oral steroid.   Consider adding Atrovent to her regimen.   Currently on Albuterol Q4,Dulera,Sigulair and oral steroid.   Monitor O2 Sat.   We will watch her till tomorrow.

## 2014-02-18 DIAGNOSIS — R5383 Other fatigue: Secondary | ICD-10-CM

## 2014-02-18 DIAGNOSIS — R5381 Other malaise: Secondary | ICD-10-CM

## 2014-02-18 MED ORDER — PREDNISONE 20 MG PO TABS: 60.0000 mg | ORAL_TABLET | Freq: Every day | ORAL | Status: DC

## 2014-02-18 MED ORDER — PANTOPRAZOLE SODIUM 20 MG PO TBEC
20.0000 mg | DELAYED_RELEASE_TABLET | Freq: Every day | ORAL | Status: DC
Start: 2014-02-18 — End: 2016-01-04

## 2014-02-18 NOTE — Progress Notes (Signed)
IV removed pending discharge. Site without redness or edema. Dry dressing applied to site. Discharge instructions and prescription for Mupirocin given to patient. States ride will be here after 3:30PM.

## 2014-02-18 NOTE — Discharge Summary (Signed)
FMTS ATTENDING  NOTE Timmey Lamba,MD I  have seen and examined this patient, reviewed their chart. I have discussed this patient with the resident. I agree with the resident's findings, assessment and care plan. 

## 2014-02-18 NOTE — Progress Notes (Signed)
Patient discharged via wheelchair with patient transport volunteers.

## 2014-02-18 NOTE — Discharge Instructions (Signed)
Lydia Griffin, You were seen in the hospital for Shortness of breath requiring continuous albuterol treatment. We were pleased to see that your breathing improved and your were able to transition to inhalers and nebs. When you go home you should take steroids for the next 6 days to help calm inflammation (day 1 after going home 3 pills, then 2.5, 2, 1.5, 1. 0.5 then off). You should also finish a 5 day course of the nasal ointment. Picking up a medication like claritin can help to control allergic symptoms further.   Remember! Always use a spacer with your metered dose inhaler! GREEN = GO!                                   Use these medications every day!  - Breathing is good  - No cough or wheeze day or night  - Can work, sleep, exercise  Rinse your mouth after inhalers as directed Dulera, Singulair Use 15 minutes before exercise or trigger exposure  Albuterol (Proventil, Ventolin, Proair) 2 puffs as needed every 4 hours    YELLOW = asthma out of control   Continue to use Green Zone medicines & add:  - Cough or wheeze  - Tight chest  - Short of breath  - Difficulty breathing  - First sign of a cold (be aware of your symptoms)  Call for advice as you need to.  Quick Relief Medicine:Albuterol (Proventil, Ventolin, Proair) 2 puffs as needed every 4 hours and Albuterol Unit Dose Neb solution 1 vial every 4 hours as needed If you improve within 20 minutes, continue to use every 4 hours as needed until completely well. Call if you are not better in 2 days or you want more advice.  If no improvement in 15-20 minutes, repeat quick relief medicine every 20 minutes for 2 more treatments (for a maximum of 3 total treatments in 1 hour). If improved continue to use every 4 hours and CALL for advice.  If not improved or you are getting worse, follow Red Zone plan.  Special Instructions:   RED = DANGER                                Get help from a doctor now!  - Albuterol not helping or not lasting 4 hours  -  Frequent, severe cough  - Getting worse instead of better  - Ribs or neck muscles show when breathing in  - Hard to walk and talk  - Lips or fingernails turn blue TAKE: Albuterol 8 puffs of inhaler with spacer If breathing is better within 15 minutes, repeat emergency medicine every 15 minutes for 2 more doses. YOU MUST CALL FOR ADVICE NOW!   STOP! MEDICAL ALERT!  If still in Red (Danger) zone after 15 minutes this could be a life-threatening emergency. Take second dose of quick relief medicine  AND  Go to the Emergency Room or call 911  If you have trouble walking or talking, are gasping for air, or have blue lips or fingernails, CALL 911!I  Continue albuterol treatments every 4-6 hours for the next 24 hours

## 2014-02-21 ENCOUNTER — Telehealth: Payer: Self-pay | Admitting: Family Medicine

## 2014-02-21 ENCOUNTER — Emergency Department (HOSPITAL_COMMUNITY)
Admission: EM | Admit: 2014-02-21 | Discharge: 2014-02-21 | Disposition: A | Discharge status: Home / Self Care | Payer: Self-pay | Attending: Emergency Medicine | Admitting: Emergency Medicine

## 2014-02-21 ENCOUNTER — Encounter (HOSPITAL_COMMUNITY): Payer: Self-pay | Admitting: Emergency Medicine

## 2014-02-21 DIAGNOSIS — Z79899 Other long term (current) drug therapy: Secondary | ICD-10-CM | POA: Insufficient documentation

## 2014-02-21 DIAGNOSIS — Z760 Encounter for issue of repeat prescription: Secondary | ICD-10-CM | POA: Insufficient documentation

## 2014-02-21 DIAGNOSIS — J45909 Unspecified asthma, uncomplicated: Secondary | ICD-10-CM

## 2014-02-21 DIAGNOSIS — M546 Pain in thoracic spine: Secondary | ICD-10-CM | POA: Insufficient documentation

## 2014-02-21 DIAGNOSIS — IMO0002 Reserved for concepts with insufficient information to code with codable children: Secondary | ICD-10-CM | POA: Insufficient documentation

## 2014-02-21 DIAGNOSIS — J45901 Unspecified asthma with (acute) exacerbation: Secondary | ICD-10-CM | POA: Insufficient documentation

## 2014-02-21 DIAGNOSIS — Z792 Long term (current) use of antibiotics: Secondary | ICD-10-CM | POA: Insufficient documentation

## 2014-02-21 MED ORDER — IPRATROPIUM-ALBUTEROL 0.5-2.5 (3) MG/3ML IN SOLN
3.0000 mL | RESPIRATORY_TRACT | Status: DC
  Administered 2014-02-21 (×2): 3 mL via RESPIRATORY_TRACT
  Filled 2014-02-21 (×2): qty 3

## 2014-02-21 MED ORDER — SODIUM CHLORIDE 0.9 % IV BOLUS (SEPSIS)
1000.0000 mL | Freq: Once | INTRAVENOUS | Status: AC
  Administered 2014-02-21: 1000 mL via INTRAVENOUS

## 2014-02-21 MED ORDER — PREDNISONE 20 MG PO TABS
60.0000 mg | ORAL_TABLET | Freq: Once | ORAL | Status: AC
  Administered 2014-02-21: 60 mg via ORAL
  Filled 2014-02-21: qty 3

## 2014-02-21 MED ORDER — ACETAMINOPHEN 325 MG PO TABS
650.0000 mg | ORAL_TABLET | Freq: Once | ORAL | Status: AC
  Administered 2014-02-21: 650 mg via ORAL
  Filled 2014-02-21: qty 2

## 2014-02-21 MED ORDER — ALBUTEROL SULFATE (2.5 MG/3ML) 0.083% IN NEBU
5.0000 mg | INHALATION_SOLUTION | Freq: Once | RESPIRATORY_TRACT | Status: AC
  Administered 2014-02-21: 5 mg via RESPIRATORY_TRACT
  Filled 2014-02-21: qty 6

## 2014-02-21 NOTE — ED Provider Notes (Signed)
CSN: [id number]     Arrival date & time 02/21/14  1703 History  This chart was scribed for non-physician practitioner, Wynetta EmeryNicole Jung Yurchak, PA-C, working with Gwyneth SproutWhitney Plunkett, MD by Shari HeritageAisha Amuda, ED Scribe. This patient was seen in room TR07C/TR07C and the patient's care was started at 6:14 PM.   Chief Complaint  Patient presents with  . Asthma    The history is provided by the patient. No language interpreter was used.    HPI Comments: Lydia Griffin is a 53 y.o. female with history of asthma who presents to the Emergency Department complaining of persistent, moderate shortness of breath for the past 2 weeks. She also reports dyspnea on exertion and she states shortness of breath has been interfering with ADLs. Patient has been using albuterol nebulizer treatments at home as instructed with no relief. She also takes [place]ulera and Singulair. She was admitted on 02/15/14 for asthma exacerbation and discharged on 02/18/14. She was prescribed prednisone and instructed to take 60 mg on the first day, then to decrease by 10 mg every day for five days. She states that her pharmacist would not fill her prednisone prescription without further instruction from her PCP, but he could not get in touch with patient's doctor. [facility] [place]. She denies associated fever, dysuria, bowel changes, cough or chest pain, fever, N/V, rhinorrhea or nasal congestion. States that she typical has exacerbation at season change.  She has been intubated twice in the past during asthma exacerbations, have not been evaluated by pulmonologist due to issues with insurance  Patient also states that she is having some upper back pain. She has not taken any medicines for pain relief. She any urinary symptoms or bowel changes.  PCP - [facility]   Past Medical History  Diagnosis Date  . Asthma     Started as a child; Has been intubated for asthma exacerbation twice in approx 1990  . Shortness of breath      "related to asthma attacks" (09/19/2012)   Past Surgical History  Procedure Laterality Date  . Hand      flexor tendon repair  . Tubal ligation  ~ 1985   Family History  Problem Relation Age of Onset  . Asthma Mother   . Heart disease Father [id number]    MI  . Alcohol abuse Father   . Asthma Sister 9    death from asthma   History  Substance Use Topics  . Smoking status: Never Smoker   . Smokeless tobacco: Never Used  . Alcohol Use: No   OB History   Grav Para Term Preterm Abortions TAB SAB Ect Mult Living                 Review of Systems  Constitutional: Negative for fever.  Respiratory: Positive for shortness of breath. Negative for cough.   Cardiovascular: Negative for chest pain.  Gastrointestinal: Negative for nausea, vomiting, abdominal pain, diarrhea, constipation and blood in stool.  Genitourinary: Negative for dysuria, urgency and hematuria.  Musculoskeletal: Positive for back pain.  All other systems reviewed and are negative.   Allergies  Review of patient's allergies indicates no known allergies.  Home Medications   Prior to Admission medications   Medication Sig Start Date End Date Taking? Authorizing Provider  albuterol (PROVENTIL HFA;VENTOLIN HFA) 108 (90 BASE) MCG/ACT inhaler Inhale 2 puffs into the lungs every 4 (four) hours as needed for wheezing or shortness of breath (cough). every 4-6 hours for the next  24 02/16/14   Anselm Lis, MD  albuterol (PROVENTIL) (5 MG/ML) 0.5% nebulizer solution Take 2.5 mg by nebulization every 6 (six) hours as needed for wheezing or shortness of breath.    Historical Provider, MD  Biotin 1000 MCG tablet Take 1,000 mcg by mouth daily.    Historical Provider, MD  Coenzyme Q10 (CO Q 10 PO) Take 1 tablet by mouth daily.    Historical Provider, MD  Cyanocobalamin (VITAMIN B 12 PO) Place 1 tablet under the tongue daily.    Historical Provider, MD  loratadine (CLARITIN) 10 MG tablet Take 1 tablet (10 mg total) by mouth daily.  02/16/14   Anselm Lis, MD  mometasone-formoterol (DULERA) 200-5 MCG/ACT AERO Inhale 2 puffs into the lungs 2 (two) times daily. 06/29/13   Dow Adolph, MD  montelukast (SINGULAIR) 10 MG tablet Take 1 tablet (10 mg total) by mouth at bedtime. 09/16/12   Macy Mis, MD  Multiple Vitamin (MULITIVITAMIN WITH MINERALS) TABS Take 1 tablet by mouth daily.    Historical Provider, MD  mupirocin ointment (BACTROBAN) 2 % Place 1 application into the nose 2 (two) times daily. 02/16/14   Anselm Lis, MD  pantoprazole (PROTONIX) 20 MG tablet Take 1 tablet (20 mg total) by mouth daily. 02/18/14   Anselm Lis, MD  predniSONE (DELTASONE) 20 MG tablet Take 3 tablets (60 mg total) by mouth daily. Then decrease by 10 every day for 5 days 02/18/14   Anselm Lis, MD  Probiotic Product (PRO-BIOTIC BLEND PO) Take 1 tablet by mouth daily.    Historical Provider, MD   Triage Vitals: BP 164/78  Pulse 94  Temp(Src) 98.3 F (36.8 C) (Oral)  Resp 16  Ht 5\' 6"  (1.676 m)  Wt 184 lb 9.6 oz (83.734 kg)  BMI 29.81 kg/m2  SpO2 98% Physical Exam  Nursing note and vitals reviewed. Constitutional: She is oriented to person, place, and time. She appears well-developed and well-nourished. No distress.  HENT:  Head: Normocephalic.  Mouth/Throat: Oropharynx is clear and moist.  Moist mucous membranes.   Eyes: Conjunctivae and EOM are normal. Pupils are equal, round, and reactive to light.  Neck: Normal range of motion.  Cardiovascular: Normal rate, regular rhythm and normal heart sounds.  Exam reveals no gallop and no friction rub.   No murmur heard. Pulmonary/Chest: Effort normal. No stridor. No respiratory distress. She has wheezes. She has no rales. She exhibits no tenderness.  Speaking in full sentences, no stridor or tachypnea  Abdominal: Soft. Bowel sounds are normal. She exhibits no distension and no mass. There is no tenderness. There is no rebound and no guarding.  Musculoskeletal: Normal range of motion.   Neurological: She is alert and oriented to person, place, and time.  Psychiatric: She has a normal mood and affect.    ED Course  Procedures (including critical care time) DIAGNOSTIC STUDIES: Oxygen Saturation is 98% on room air, normal by my interpretation.    COORDINATION OF CARE: 6:24 PM- Patient informed of current plan for treatment and evaluation and agrees with plan at this time.   7:10 PM - On recheck upon listening to patient's lungs there is mild improvement in wheezing after  after nebulizer treatment. Will order 2nd treatment.  8:54 PM - Reduced wheeze on recheck after 2nd treatment. Will order additional treatment (3rd).   Labs Review Labs Reviewed - No data to display  Imaging Review No results found.   EKG Interpretation None      MDM   Final diagnoses:  Asthma  Medication refill    Filed Vitals:   02/21/14 1725 02/21/14 1856 02/21/14 2010 02/21/14 2150  BP: 164/78 134/74 142/88 138/81  Pulse: 94 94 85 86  Temp: 98.3 F (36.8 C) 98.5 F (36.9 C) 98.6 F (37 C) 97.6 F (36.4 C)  TempSrc: Oral Oral Oral Oral  Resp: 16 22 20 16   Height: 5\' 6"  (1.676 m)     Weight: 184 lb 9.6 oz (83.734 kg)     SpO2: 98% 95% 96% 94%    Medications  sodium chloride 0.9 % bolus 1,000 mL (0 mLs Intravenous Stopped 02/21/14 2056)  predniSONE (DELTASONE) tablet 60 mg (60 mg Oral Given 02/21/14 1851)  acetaminophen (TYLENOL) tablet 650 mg (650 mg Oral Given 02/21/14 1850)  albuterol (PROVENTIL) (2.5 MG/3ML) 0.083% nebulizer solution 5 mg (5 mg Nebulization Given 02/21/14 2056)    Lydia Griffin is a 53 y.o. female presenting with for med refill of prednisone, I have called the pharmacy at [facility] [place] and clarified the original script Prednisone 60mg  day one decrease by 10 mg for 5 more days.  Pt hydrated at her request. 3x nebs given with improvement in wheezing. Pt ambulated and able to do so without SOB. Pt evaluated by family practice Dr. Warner MccreedyBrian [name].    Evaluation does not show pathology that would require ongoing emergent intervention or inpatient treatment. Pt is hemodynamically stable and mentating appropriately. Discussed findings and plan with patient/guardian, who agrees with care plan. All questions answered. Return precautions discussed and outpatient follow up given.   Note: Portions of this report may have been transcribed using voice recognition software. Every effort was made to ensure accuracy; however, inadvertent computerized transcription errors may be present.  I personally performed the services described in this documentation, which was scribed in my presence. The recorded information has been reviewed and is accurate.    Wynetta Emeryicole Murl Zogg, PA-C 02/22/14 1453

## 2014-02-21 NOTE — Progress Notes (Signed)
I stopped by to see [name] in the ED.  Currently doing well s/p neb tx and appreciate the PA, [facility], confirming her steroid dose with the pharmacy [facility].  Pt is moving air well with scattered wheezes s/p one neb tx/prednisone and explained tx plan with her to use her albuterol neb q 4 hrs for the next 24 hrs, pick up her prednisone in the AM and follow Rx, and to continue with her dulera 2 puffs BID.  Changed her f/u from this upcoming Friday to this upcoming Tuesday for closer evaluation.  Pt agreeable to plan and will continue to follow along.  Twana FirstBryan R. Paulina FusiHess, DO [facility] 02/21/2014, 8:20 PM

## 2014-02-21 NOTE — ED Notes (Signed)
[facility]ation [facility]/29. She was supposed to get a prescription for prednisone to after discharge. The pharmacy told her the prescription was not written correctly and they needed to talk to her doctor before they could fill it but they could not ever get a call back from her doctor. She states she has continued to feel SOB since discharge adn she is using her nebs without relief, feels that she needs the prednisone. She is alert, labored breathing on exertion. No pain

## 2014-02-21 NOTE — Discharge Instructions (Signed)
Please follow with your primary care doctor in the next 2 days for a check-up. They must obtain records for further management.   Do not hesitate to return to the Emergency Department for any new, worsening or concerning symptoms.  "  Asthma, Adult Asthma is a condition of the lungs in which the airways tighten and narrow. Asthma can make it hard to breathe. Asthma cannot be cured, but medicine and lifestyle changes can help control it. Asthma may be started (triggered) by:  Animal skin flakes (dander).  Dust.  Cockroaches.  Pollen.  Mold.  Smoke.  Cleaning products.  Hair sprays or aerosol sprays.  Paint fumes or strong smells.  Cold air, weather changes, and winds.  Crying or laughing hard.  Stress.  Certain medicines or drugs.  Foods, such as dried fruit, potato chips, and sparkling grape juice.  Infections or conditions (colds, flu).  Exercise.  Certain medical conditions or diseases.  Exercise or tiring activities. HOME CARE   Take medicine as told by your doctor.  Use a peak flow meter as told by your doctor. A peak flow meter is a tool that measures how well the lungs are working.  Record and keep track of the peak flow meter's readings.  Understand and use the asthma action plan. An asthma action plan is a written plan for taking care of your asthma and treating your attacks.  To help prevent asthma attacks:  Do not smoke. Stay away from secondhand smoke.  Change your heating and air conditioning filter often.  Limit your use of fireplaces and wood stoves.  Get rid of pests (such as roaches and mice) and their droppings.  Throw away plants if you see mold on them.  Clean your floors. Dust regularly. Use cleaning products that do not smell.  Have someone vacuum when you are not home. Use a vacuum cleaner with a HEPA filter if possible.  Replace carpet with wood, tile, or vinyl flooring. Carpet can trap animal skin flakes and dust.  Use  allergy-proof pillows, mattress covers, and box spring covers.  Wash bed sheets and blankets every week in hot water and dry them in a dryer.  Use blankets that are made of polyester or cotton.  Clean bathrooms and kitchens with bleach. If possible, have someone repaint the walls in these rooms with mold-resistant paint. Keep out of the rooms that are being cleaned and painted.  Wash hands often. GET HELP IF:  You have make a whistling sound when breaking (wheeze), have shortness of breath, or have a cough even if taking medicine to prevent attacks.  The colored mucus you cough up (sputum) is thicker than usual.  The colored mucus you cough up changes from clear or white to yellow, green, gray, or bloody.  You have problems from the medicine you are taking such as:  A rash.  Itching.  Swelling.  Trouble breathing.  You need reliever medicines more than 2 3 times a week.  Your peak flow measurement is still at 50 79% of your personal best after following the action plan for 1 hour. GET HELP RIGHT AWAY IF:   You seem to be worse and are not responding to medicine during an asthma attack.  You are short of breath even at rest.  You get short of breath when doing very little activity.  You have trouble eating, drinking, or talking.  You have chest pain.  You have a fast heartbeat.  Your lips or fingernails start to turn  blue.  You are lightheaded, dizzy, or faint.  Your peak flow is less than 50% of your personal best.  You have a fever or lasting symptoms for more than 2 3 days.  You have a fever and your symptoms suddenly get worse. MAKE SURE YOU:   Understand these instructions.  Will watch your condition.  Will get help right away if you are not doing well or get worse. Document Released: 03/27/2008 Document Revised: 07/30/2013 Document Reviewed: 05/08/2013 [facility] Patient Information 2014 [place], [place].

## 2014-02-21 NOTE — Telephone Encounter (Signed)
Pt called after hour lines stating she could not get her prednisone filled and now having increased dyspnea.  Is heading to the ED.  Twana FirstBryan R. Paulina FusiHess, DO of [facility] 02/21/2014, 4:22 PM

## 2014-02-22 NOTE — ED Provider Notes (Signed)
Medical screening examination/treatment/procedure(s) were performed by non-physician practitioner and as supervising physician I was immediately available for consultation/collaboration.   EKG Interpretation None        Chloie Loney, MD 02/22/14 2328 

## 2014-02-24 ENCOUNTER — Inpatient Hospital Stay: Payer: Self-pay | Admitting: Family Medicine

## 2014-02-25 NOTE — ED Provider Notes (Signed)
See prior note   Lydia GivensIva L Sujey Gundry, MD 02/25/14 [phone]

## 2014-02-27 ENCOUNTER — Encounter: Payer: Self-pay | Admitting: Family Medicine

## 2014-02-27 ENCOUNTER — Ambulatory Visit (INDEPENDENT_AMBULATORY_CARE_PROVIDER_SITE_OTHER): Payer: Self-pay | Admitting: Family Medicine

## 2014-02-27 ENCOUNTER — Inpatient Hospital Stay: Payer: Self-pay | Admitting: Family Medicine

## 2014-02-27 VITALS — BP 133/55 | HR 79 | Temp 98.9°F | Ht 66.0 in | Wt 191.0 lb

## 2014-02-27 DIAGNOSIS — J45909 Unspecified asthma, uncomplicated: Secondary | ICD-10-CM

## 2014-02-27 MED ORDER — METHYLPREDNISOLONE SODIUM SUCC 125 MG IJ SOLR
125.0000 mg | Freq: Once | INTRAMUSCULAR | Status: AC
  Administered 2014-02-27: 125 mg via INTRAMUSCULAR

## 2014-02-27 NOTE — Patient Instructions (Addendum)
[name],  Thank you for coming in today.  Keep track of your peak flow. Be sure to call for help if your flow is < 50 % of your personal best.  Peak flow 200 today.  Shot of solumedrol today. Call me on Monday with update of symptoms.  Continue current medications.  Due for mammogram: Please call Stoney BangSabrina Griffin, [phone],  with the BCCCP (breast and cervical cancer control program) [facility] to set up an appointment to verify eligibility for a breast exam, mammogram, ultrasound. If you qualify [facility].   Dr. Armen PickupFunches   Peak Flow Meter A peak flow meter is a device that measures how well your lungs are working. For people with asthma, it is a simple but important tool to help with daily asthma management. Peak flow meters are available over the counter.  The readings from the meter will help you and your health care provider:   Determine the severity of your asthma.   Evaluate the effectiveness of your current treatment.   Determine when to add or stop certain medicines.   Recognize an asthma attack before signs or symptoms appear.   Decide when to seek emergency care.  RISKS AND COMPLICATIONS When using the peak flow meter, breathing too quickly may cause dizziness. At an extreme, this could cause you to pass out. Take your time so you do not get dizzy or light-headed.  HOW TO FIND YOUR PERSONAL BEST Your "personal best" is the highest peak flow rate you can reach when you feel good and have no asthma symptoms. Your flow rate serves as a benchmark in your daily self-management plan. Because everyone's asthma is different, your personal best will be unique to you.  Your health care provider will help you figure out your personal best. Typically, you will take readings once or twice a day for 2 weeks when you are not having symptoms. The highest reading during the trial period is your personal best. Because your lung function can change  over time, your personal best should be remeasured each year. HOW TO USE YOUR PEAK FLOW METER  1. Move the upper marker to the number that is your personal best.  2. Move the lower marker to the bottom of the numbered scale.  3. Connect the mouthpiece to the peak flow meter.  4. Stand up.  5. Take a deep breath. Fill your lungs completely.  6. Place your lips tightly around the mouthpiece. Blow as hard and as fast as you can with a single breath, as if you are blowing out candles. If your lips are not placed tightly around the peak flow meter mouthpiece, you will get incorrect low readings.  7. Note the final position of the lower marker. This is your peak flow rate.  8. Move the lower marker back to the bottom of the numbered scale.  9. Repeat these steps 2 more times. Record the highest reading of the 3 tries in your asthma diary. Do not calculate the average for your 3 tries, just record the highest.  Always write down the results in your asthma diary. After using your peak flow meter, rest and breathe slowly and easily. Keep a record of your progress. Your health care provider can provide you with a simple table to help with this. For the most accurate readings, it is important to keep your peak flow meter clean. Follow the manufacturer's instructions on how to take care of your  peak flow meter.  Your health care provider will give you instructions on when to do regular monitoring. You may also need to check your peak flow when:   Asthma symptoms wake you up at night.   You have increased symptoms during the day.   You have a cold, flu, or other illness that affects your breathing.   You need quick-relief "rescue medicine." If possible, check your peak flow before you use rescue medicine. Check it again 20 30 minutes after taking the medicine.  HOW TO USE YOUR RESULTS You and your health care provider can use color-coded zones on the meter to see how your peak flow rate  compares to your personal best. The color code for each zone reflects progressively more severe symptoms:  Green Zone = Stable   Your peak flow rates are 80 100% of your personal best. This means that your asthma is under control.   You probably have no asthma signs or symptoms.   Take your preventive medicine as usual. Yellow Zone = Caution   Your peak flow rates are 50 80% of your personal best. This means that your asthma is getting worse and could be improved.   You may have signs and symptoms such as coughing, wheezing, or chest tightness. However, your peak flow rates may decrease before symptoms appear.   You may need to increase or change your asthma medicine. If you have an asthma action plan, follow all of the steps listed for the yellow zone, including medicine changes. Red Zone = Danger   Your peak flow rates are less than 50% of your personal best. This means that you may be in danger of a medical emergency.   You may have severe coughing, wheezing, and shortness of breath. Stop whatever you are doing. Use your rescue inhaler or other medicines to open your airways.   Your asthma action plan will help you decide whether to call your health care provider or seek emergency care. If your flow readings fall too far below your personal best into the yellow or red zone, you will need to take action to prevent or minimize an asthma attack.  SEEK MEDICAL CARE IF: You are in the yellow zone. If you have an asthma action plan, follow all of the steps listed under the yellow zone section of the plan. Let your health care provider know that you are in the yellow zone. SEEK IMMEDIATE MEDICAL CARE IF:  You are in the red zone. If you have an asthma action plan, follow all of the steps listed under the red zone section of the plan while you are seeking immediate medical care. Document Released: 08/06/2007 Document Revised: 06/11/2013 Document Reviewed: 03/27/2013 [facility] Patient  Information 2014 [place], [place].

## 2014-02-27 NOTE — Assessment & Plan Note (Addendum)
A: persistent asthma with prolonged exacerbation. No distress no desaturation. Persistent wheezing. Complaint with LABA/inhaled corticosteroid, prn albuterol, singulair,loratadine.  P: Solumedrol 125 mg x one today. Patient to call me with symptoms f/u in 3 days. Patient to monitor peak flow.

## 2014-02-27 NOTE — Progress Notes (Signed)
   Subjective:    Patient ID: Lydia Griffin, female    DOB: [birth year], 53 y.o.   MRN: [id number] CC: HFU asthma exacerbation  HPI 53 yo F with asthma presents for f/u visit. She was hospitalized, treated in SDU, did not require BiPAP or intubation, treated with steroids. She had persistent exacerbation and went to the ED. She was given additional steroid taper. She was feeling well until today. Today she feels cloudy, short of breath, fatigue. Intermittent cough. No fever or chest pain. Compliant with medications. She is down to 10 mg of prednisone from her taper, last day. Using albuterol q 2 hrs.   Regarding insurance: she is working on Electronics engineergathering documents. Waiting for W-2 from a job 2 years ago.   Soc Hx: chronic non smoker  Review of Systems As per HPI     Objective:   Physical Exam BP 133/55  Pulse 79  Temp(Src) 98.9 F (37.2 C) (Oral)  Ht 5\' 6"  (1.676 m)  Wt 191 lb (86.637 kg)  BMI 30.84 kg/m2  SpO2 96% Best 1 of 3 peak flows: 200.  General appearance: alert, cooperative and no distress Eyes: conjunctivae/corneas clear. PERRL, EOM's intact.  Ears: normal TM's and external ear canals both ears Nose: no discharge, turbinates pale, swollen Throat: lips, mucosa, and tongue normal; teeth and gums normal Neck: no adenopathy, no carotid bruit, no JVD, supple, symmetrical, trachea midline and thyroid not enlarged, symmetric, no tenderness/mass/nodules Lungs: normal WOB. Diffuse inspiratory wheezing. no crackles. BS equal  Heart: regular rate and rhythm, S1, S2 normal, no murmur, click, rub or gallop     Assessment & Plan:

## 2014-07-17 ENCOUNTER — Ambulatory Visit (INDEPENDENT_AMBULATORY_CARE_PROVIDER_SITE_OTHER): Payer: Self-pay | Admitting: Family Medicine

## 2014-07-17 ENCOUNTER — Encounter: Payer: Self-pay | Admitting: Family Medicine

## 2014-07-17 VITALS — BP 148/80 | HR 73 | Temp 97.8°F | Wt 180.9 lb

## 2014-07-17 DIAGNOSIS — J45901 Unspecified asthma with (acute) exacerbation: Secondary | ICD-10-CM

## 2014-07-17 DIAGNOSIS — J45909 Unspecified asthma, uncomplicated: Secondary | ICD-10-CM

## 2014-07-17 MED ORDER — PREDNISONE 20 MG PO TABS: 50.0000 mg | ORAL_TABLET | Freq: Every day | ORAL | Status: DC

## 2014-07-17 MED ORDER — PREDNISONE 20 MG PO TABS: 60.0000 mg | ORAL_TABLET | Freq: Every day | ORAL | Status: DC

## 2014-07-17 MED ORDER — ALBUTEROL SULFATE (2.5 MG/3ML) 0.083% IN NEBU
2.5000 mg | INHALATION_SOLUTION | Freq: Once | RESPIRATORY_TRACT | Status: AC
  Administered 2014-07-17: 2.5 mg via RESPIRATORY_TRACT

## 2014-07-17 MED ORDER — SODIUM CHLORIDE 0.9 % IV SOLN
125.0000 mg | Freq: Once | INTRAVENOUS | Status: AC
  Administered 2014-07-17: 130 mg via INTRAMUSCULAR

## 2014-07-17 MED ORDER — IPRATROPIUM BROMIDE 0.02 % IN SOLN
0.5000 mg | Freq: Once | RESPIRATORY_TRACT | Status: AC
  Administered 2014-07-17: 0.5 mg via RESPIRATORY_TRACT

## 2014-07-17 NOTE — Patient Instructions (Addendum)
Smoking Cessation Quitting smoking is important to your health and has many advantages. However, it is not always easy to quit since nicotine is a very addictive drug. Oftentimes, people try 3 times or more before being able to quit. This document explains the best ways for you to prepare to quit smoking. Quitting takes hard work and a lot of effort, but you can do it. ADVANTAGES OF QUITTING SMOKING  You will live longer, feel better, and live better.  Your body will feel the impact of quitting smoking almost immediately.  Within 20 minutes, blood pressure decreases. Your pulse returns to its normal level.  After 8 hours, carbon monoxide levels in the blood return to normal. Your oxygen level increases.  After 24 hours, the chance of having a heart attack starts to decrease. Your breath, hair, and body stop smelling like smoke.  After 48 hours, damaged nerve endings begin to recover. Your sense of taste and smell improve.  After 72 hours, the body is virtually free of nicotine. Your bronchial tubes relax and breathing becomes easier.  After 2 to 12 weeks, lungs can hold more air. Exercise becomes easier and circulation improves.  The risk of having a heart attack, stroke, cancer, or lung disease is greatly reduced.  After 1 year, the risk of coronary heart disease is cut in half.  After 5 years, the risk of stroke falls to the same as a nonsmoker.  After 10 years, the risk of lung cancer is cut in half and the risk of other cancers decreases significantly.  After 15 years, the risk of coronary heart disease drops, usually to the level of a nonsmoker.  If you are pregnant, quitting smoking will improve your chances of having a healthy baby.  The people you live with, especially any children, will be healthier.  You will have extra money to spend on things other than cigarettes. QUESTIONS TO THINK ABOUT BEFORE ATTEMPTING TO QUIT You may want to talk about your answers with your  health care provider.  Why do you want to quit?  If you tried to quit in the past, what helped and what did not?  What will be the most difficult situations for you after you quit? How will you plan to handle them?  Who can help you through the tough times? Your family? Friends? A health care provider?  What pleasures do you get from smoking? What ways can you still get pleasure if you quit? Here are some questions to ask your health care provider:  How can you help me to be successful at quitting?  What medicine do you think would be best for me and how should I take it?  What should I do if I need more help?  What is smoking withdrawal like? How can I get information on withdrawal? GET READY  Set a quit date.  Change your environment by getting rid of all cigarettes, ashtrays, matches, and lighters in your home, car, or work. Do not let people smoke in your home.  Review your past attempts to quit. Think about what worked and what did not. GET SUPPORT AND ENCOURAGEMENT You have a better chance of being successful if you have help. You can get support in many ways.  Tell your family, friends, and coworkers that you are going to quit and need their support. Ask them not to smoke around you.  Get individual, group, or telephone counseling and support. Programs are available at local hospitals and health centers. Call   your local health department for information about programs in your area.  Spiritual beliefs and practices may help some smokers quit.  Download a "quit meter" on your computer to keep track of quit statistics, such as how long you have gone without smoking, cigarettes not smoked, and money saved.  Get a self-help book about quitting smoking and staying off tobacco. LEARN NEW SKILLS AND BEHAVIORS  Distract yourself from urges to smoke. Talk to someone, go for a walk, or occupy your time with a task.  Change your normal routine. Take a different route to work.  Drink tea instead of coffee. Eat breakfast in a different place.  Reduce your stress. Take a hot bath, exercise, or read a book.  Plan something enjoyable to do every day. Reward yourself for not smoking.  Explore interactive web-based programs that specialize in helping you quit. GET MEDICINE AND USE IT CORRECTLY Medicines can help you stop smoking and decrease the urge to smoke. Combining medicine with the above behavioral methods and support can greatly increase your chances of successfully quitting smoking.  Nicotine replacement therapy helps deliver nicotine to your body without the negative effects and risks of smoking. Nicotine replacement therapy includes nicotine gum, lozenges, inhalers, nasal sprays, and skin patches. Some may be available over-the-counter and others require a prescription.  Antidepressant medicine helps people abstain from smoking, but how this works is unknown. This medicine is available by prescription.  Nicotinic receptor partial agonist medicine simulates the effect of nicotine in your brain. This medicine is available by prescription. Ask your health care provider for advice about which medicines to use and how to use them based on your health history. Your health care provider will tell you what side effects to look out for if you choose to be on a medicine or therapy. Carefully read the information on the package. Do not use any other product containing nicotine while using a nicotine replacement product.  RELAPSE OR DIFFICULT SITUATIONS Most relapses occur within the first 3 months after quitting. Do not be discouraged if you start smoking again. Remember, most people try several times before finally quitting. You may have symptoms of withdrawal because your body is used to nicotine. You may crave cigarettes, be irritable, feel very hungry, cough often, get headaches, or have difficulty concentrating. The withdrawal symptoms are only temporary. They are strongest  when you first quit, but they will go away within 10-14 days. To reduce the chances of relapse, try to:  Avoid drinking alcohol. Drinking lowers your chances of successfully quitting.  Reduce the amount of caffeine you consume. Once you quit smoking, the amount of caffeine in your body increases and can give you symptoms, such as a rapid heartbeat, sweating, and anxiety.  Avoid smokers because they can make you want to smoke.  Do not let weight gain distract you. Many smokers will gain weight when they quit, usually less than 10 pounds. Eat a healthy diet and stay active. You can always lose the weight gained after you quit.  Find ways to improve your mood other than smoking. FOR MORE INFORMATION  www.smokefree.gov  Document Released: 10/03/2001 Document Revised: 02/23/2014 Document Reviewed: 01/18/2012 [facility] Patient Information 2015 [place], [place]. This information is not intended to replace advice given to you by your health care provider. Make sure you discuss any questions you have with your health care provider.  It is extremely important for you to take the daily control inhaler. In addition if you could quit smoking, it would  benefit you a great deal and how to control your asthma. I have included some information on smoking cessation. You're happy to help you with this by providing counseling and prescriptions if necessary. Have called in a steroid taper for you to start. In addition we will give you a shot of steroids today. We will give you a sample of the later today to get you through into your insurance kicks in.  If you are not feeling better in 3-4 days, or you develop a fever, please come back to be seen immediately. It does not appear that you have an infectious process currently, and is likely an asthma exacerbation triggered by the weather. If you're not feeling better within 3-4 days please be seen again at that time he may want to get a chest x-ray.

## 2014-07-17 NOTE — Assessment & Plan Note (Addendum)
Patient with asthma exacerbation today, likely triggered by change in weather. In addition to being out of her control her inhaler for 2 weeks. - Nebulizer  Treatment given in clinic, with some improvement. - Solu-Medrol 125 given today - Steroid taper pack to start tomorrow - Dulera  sample given to patient, LOT # [id number] Red flags discussed with patient she is to return in 3-4 days if no improvement. At that time would like a chest x-ray if she's not getting any better.

## 2014-07-17 NOTE — Progress Notes (Signed)
   Subjective:    Patient ID: Lydia Griffin, female    DOB: 09-17-1961, 53 y.o.   MRN: [id number]  HPI Lydia Griffin is a 53 year old female  presents for same-day appointment  Asthma exacerbation: Patient presents to family medicine clinic today for an acute asthma exacerbation. She states she started feeling chest tightness approximately one week ago. She has been out of her [facility] or a for 2 weeks. She is changing jobs and states she should be able to get it next month. She states she's had some wheezing upon waking in the morning, which is unusual for her. She is using her albuterol multiple times a day, greater than 6. Her peak flows have been approximately 250. She has not had any steroid doses within the last few months. She does have a strong history of asthma exacerbations, with hospitalizations, intubations x2 in many trips to the emergency room. Her sister passed away from asthma as a young child. Patient continues to smoke, and is not interested in smoking cessation at this time.  Every day Smoker  Review of Systems Per HPI    Objective:   Physical Exam BP 148/80  Pulse 73  Temp(Src) 97.8 F (36.6 C) (Oral)  Wt 180 lb 14.4 oz (82.056 kg)  SpO2 99%  Gen: Pleasant, African American female, able to speak full sentences but with some difficulty. Well-developed, well-nourished. HEENT: AT. [place]. Bilateral TM visualized and normal in appearance. Bilateral eyes without injections or icterus. MMM. Bilateral nares with mild erythema and swelling. Throat without erythema or exudates.  CV: RRR, 1/6 systolic murmur appreciated. Chest: Diffuse wheezing throughout all lung fields. No crackles. Moderate rhonchi.       Assessment & Plan:

## 2014-09-01 IMAGING — CR DG CHEST 2V
2 series · 2 of 2 positions shown · non-contrast
Comparison: 09/19/2012

CLINICAL DATA: Asthma.  Shortness of breath.

CHEST - 2 VIEW

[w chest pa]
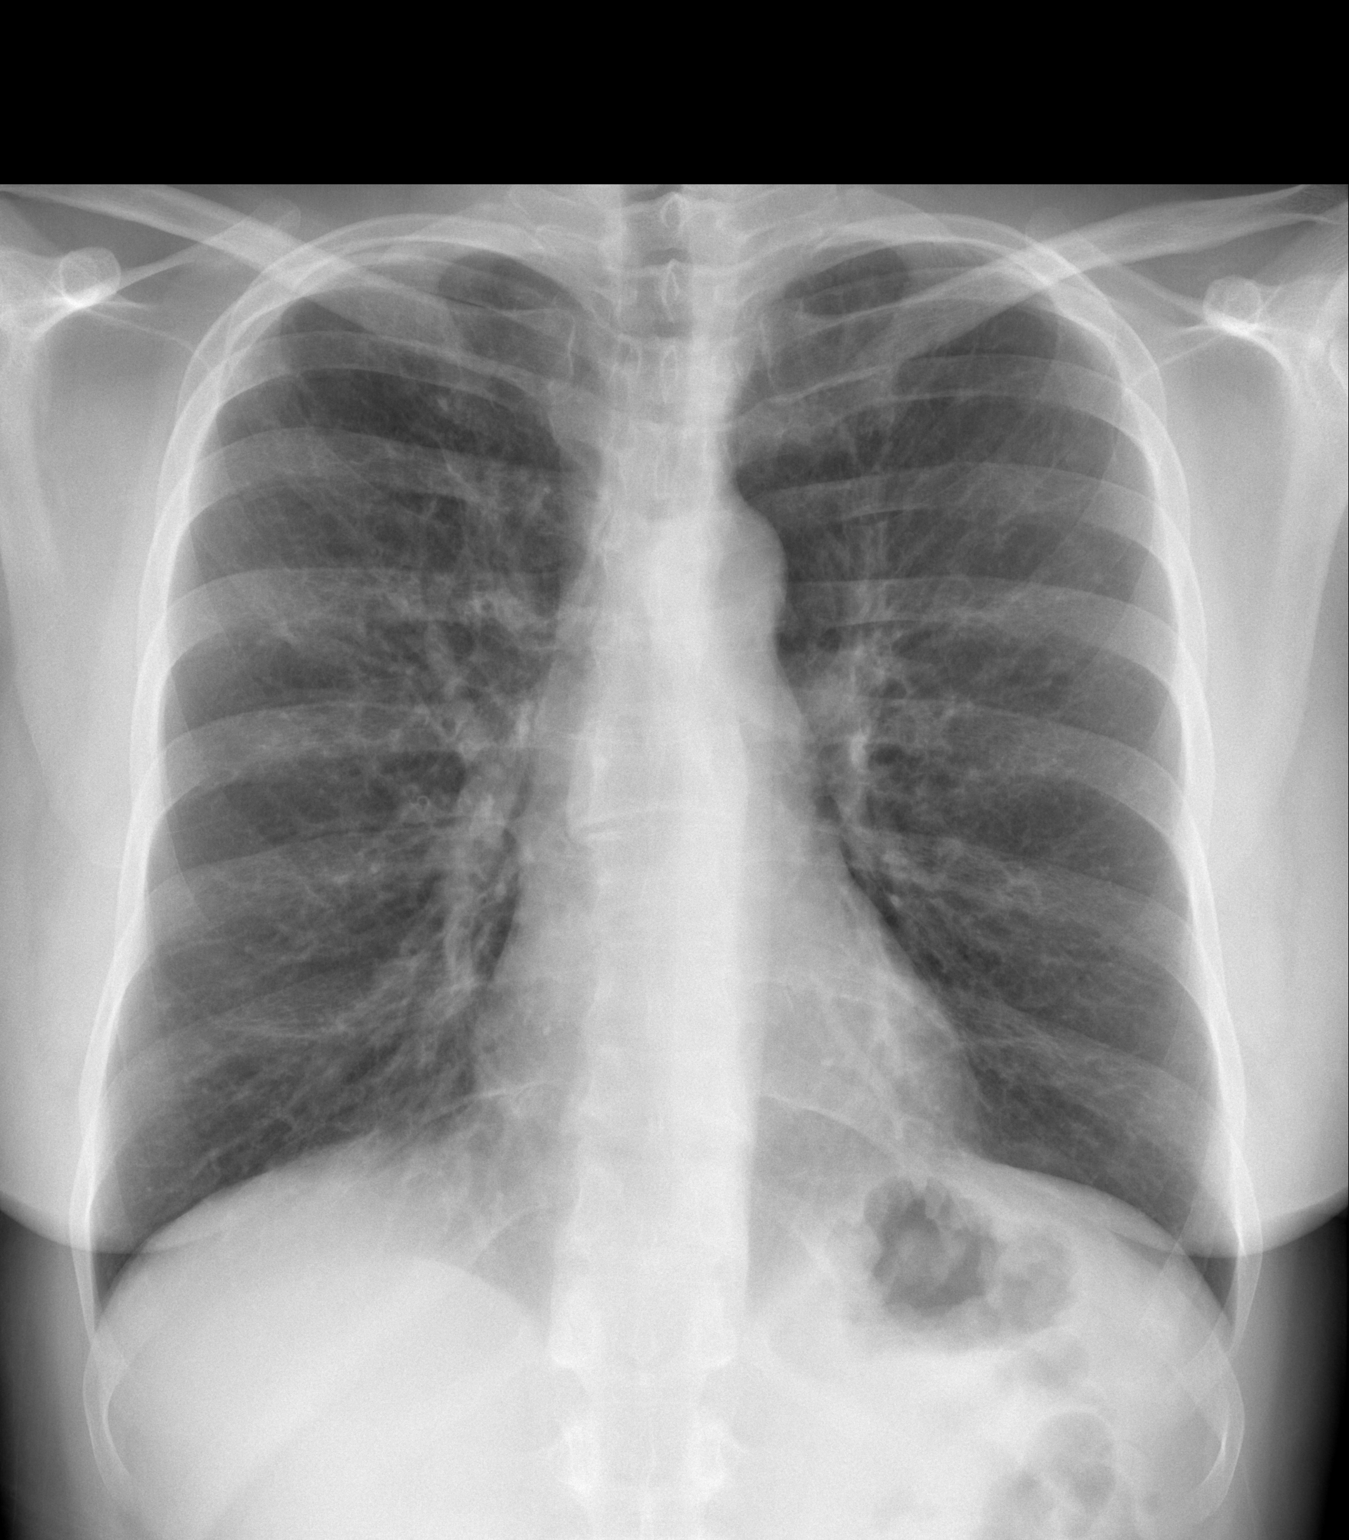

[w chest lat]
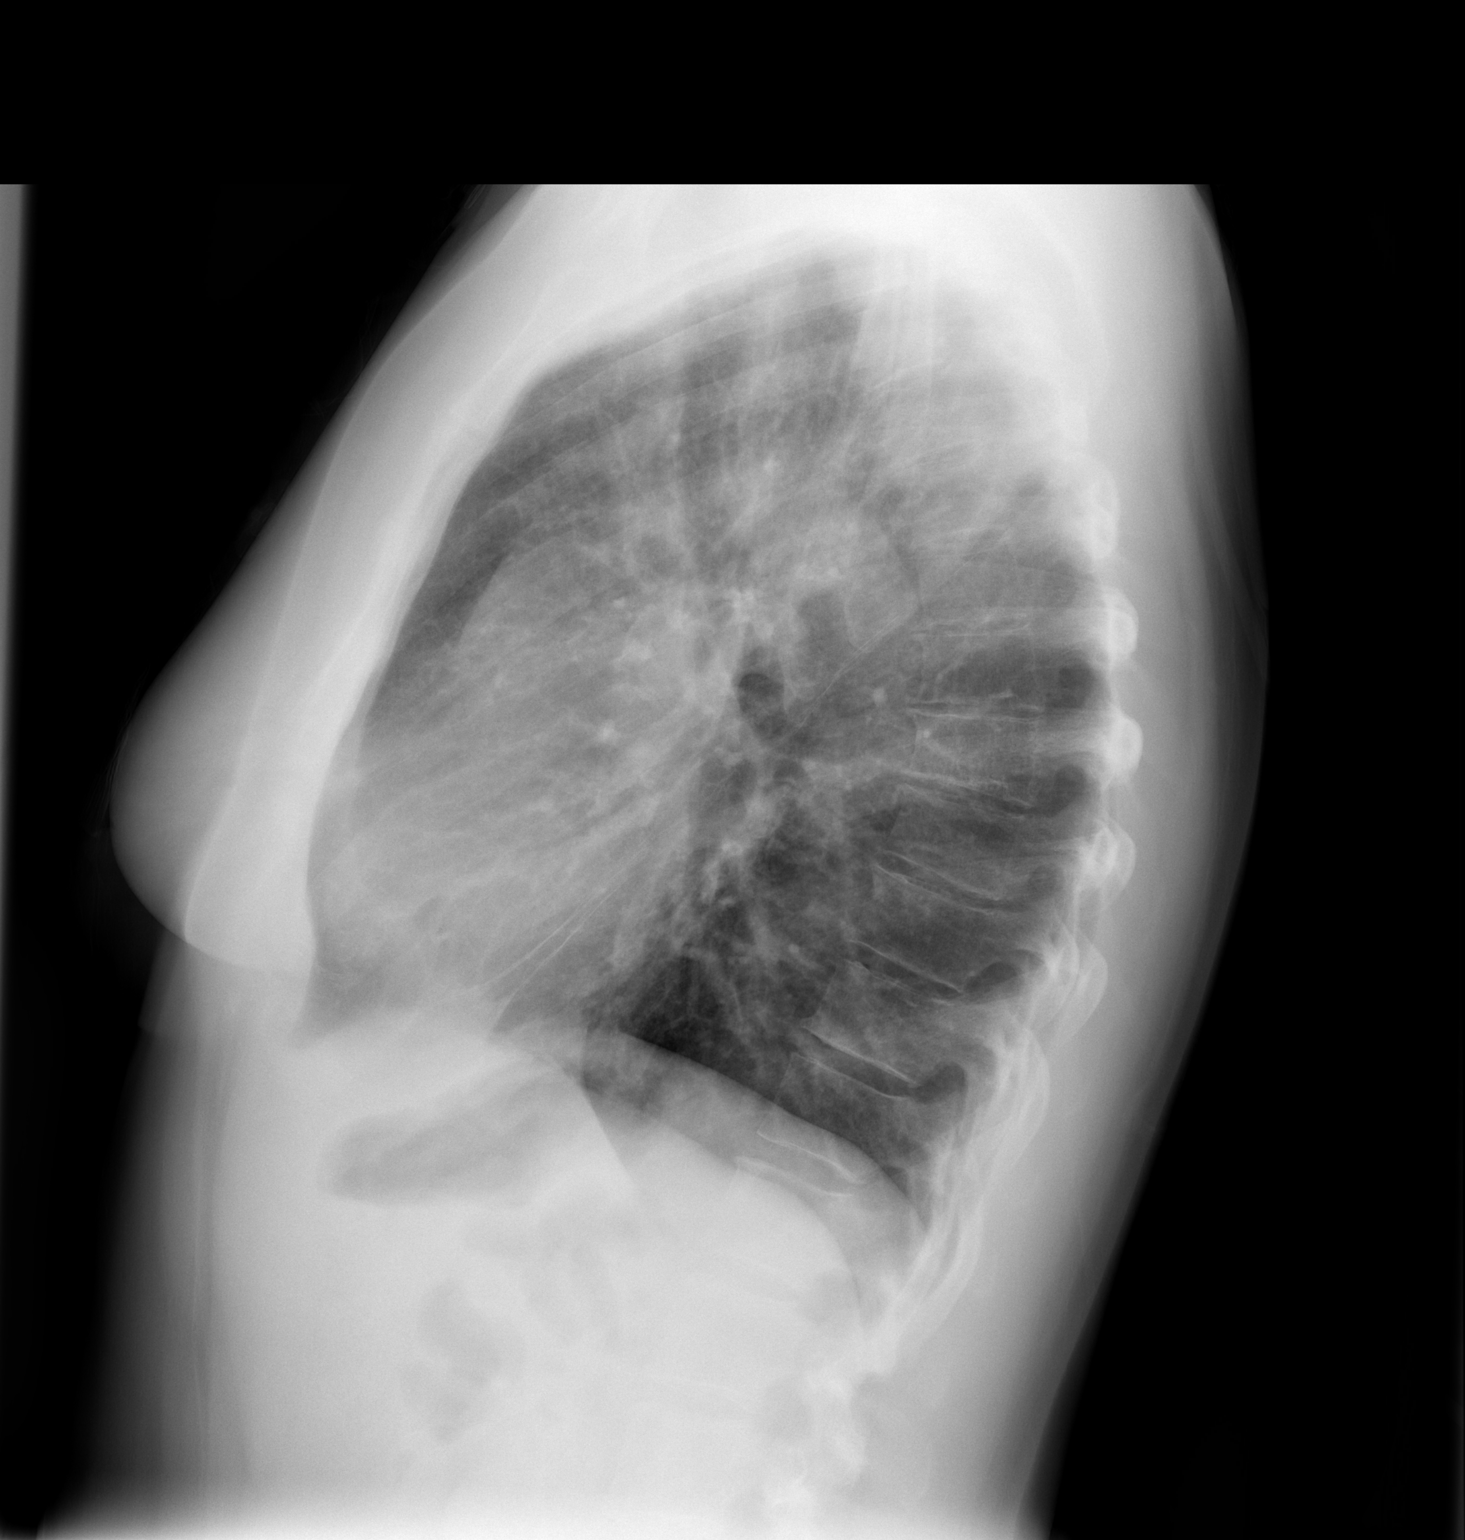

[2 of 2 positions shown; findings below may reference images not displayed]

FINDINGS: Heart size is normal.  No pleural effusion or edema.
Lung volumes are normal.  There is diffuse bilateral bronchial wall
thickening.  No airspace consolidation or atelectasis.
IMPRESSION: 1.  Diffuse bronchial wall thickening.
2.  No pneumonia.

## 2015-01-02 ENCOUNTER — Emergency Department (HOSPITAL_COMMUNITY): Payer: Self-pay

## 2015-01-02 ENCOUNTER — Emergency Department (HOSPITAL_COMMUNITY)
Admission: EM | Admit: 2015-01-02 | Discharge: 2015-01-02 | Disposition: A | Discharge status: Home / Self Care | Payer: Self-pay | Attending: Emergency Medicine | Admitting: Emergency Medicine

## 2015-01-02 ENCOUNTER — Encounter (HOSPITAL_COMMUNITY): Payer: Self-pay | Admitting: Physical Medicine and Rehabilitation

## 2015-01-02 ENCOUNTER — Telehealth: Payer: Self-pay | Admitting: Family Medicine

## 2015-01-02 DIAGNOSIS — Z7952 Long term (current) use of systemic steroids: Secondary | ICD-10-CM | POA: Insufficient documentation

## 2015-01-02 DIAGNOSIS — Z79899 Other long term (current) drug therapy: Secondary | ICD-10-CM | POA: Insufficient documentation

## 2015-01-02 DIAGNOSIS — I252 Old myocardial infarction: Secondary | ICD-10-CM | POA: Insufficient documentation

## 2015-01-02 DIAGNOSIS — J45901 Unspecified asthma with (acute) exacerbation: Secondary | ICD-10-CM

## 2015-01-02 MED ORDER — MOMETASONE FURO-FORMOTEROL FUM 200-5 MCG/ACT IN AERO: 2.0000 | INHALATION_SPRAY | Freq: Two times a day (BID) | RESPIRATORY_TRACT | Status: DC

## 2015-01-02 MED ORDER — IPRATROPIUM-ALBUTEROL 0.5-2.5 (3) MG/3ML IN SOLN
3.0000 mL | Freq: Once | RESPIRATORY_TRACT | Status: AC
  Administered 2015-01-02: 3 mL via RESPIRATORY_TRACT
  Filled 2015-01-02: qty 3

## 2015-01-02 MED ORDER — PREDNISONE 20 MG PO TABS
60.0000 mg | ORAL_TABLET | Freq: Once | ORAL | Status: AC
  Administered 2015-01-02: 60 mg via ORAL
  Filled 2015-01-02: qty 3

## 2015-01-02 MED ORDER — PREDNISONE 20 MG PO TABS: ORAL_TABLET | ORAL | Status: DC

## 2015-01-02 NOTE — Telephone Encounter (Signed)
54 yo with asthma calls emergency line to report wheezing and dyspnea starting yesterday in addition to cold symptoms. Reports she went to work but now cannot continue due to dyspnea. Recommend she be seen at urgent care for likely asthma exacerbation brought on by URI. Patient voiced understanding and agreement.

## 2015-01-02 NOTE — ED Provider Notes (Signed)
CSN: [id number]     Arrival date & time 01/02/15  7829 History   First MD Initiated Contact with Patient 01/02/15 [phone]     Chief Complaint  Patient presents with  . Asthma  . Medication Refill     (Consider location/radiation/quality/duration/timing/severity/associated sxs/prior Treatment) HPI  Lydia Griffin is a 54 y.o. female with PMH of asthma presenting with one week of shortness of breath as well as cough runny nose and congestion. Patient states she is out of her delay or but's been taking all of her other inhalers as needed. She reports they're not significantly helping. She denies any chest pain. Last time she had an asthma exacerbation was in September and she required steroids. She is taken any since. He does not remember the last time she was hospitalized for asthma.    Past Medical History  Diagnosis Date  . Asthma     Started as a child; Has been intubated for asthma exacerbation twice in approx 1990  . Shortness of breath     "related to asthma attacks" (09/19/2012)   Past Surgical History  Procedure Laterality Date  . Hand      flexor tendon repair  . Tubal ligation  ~ 1985   Family History  Problem Relation Age of Onset  . Asthma Mother   . Heart disease Father 71    MI  . Alcohol abuse Father   . Asthma Sister 9    death from asthma   History  Substance Use Topics  . Smoking status: Never Smoker   . Smokeless tobacco: Never Used  . Alcohol Use: No   OB History    No data available     Review of Systems 10 Systems reviewed and are negative for acute change except as noted in the HPI.    Allergies  Review of patient's allergies indicates no known allergies.  Home Medications   Prior to Admission medications   Medication Sig Start Date End Date Taking? Authorizing Provider  albuterol (PROVENTIL HFA;VENTOLIN HFA) 108 (90 BASE) MCG/ACT inhaler Inhale 2 puffs into the lungs every 4 (four) hours as needed for wheezing or shortness of breath  (cough). every 4-6 hours for the next 24 02/16/14  Yes Charlane Ferretti, MD  albuterol (PROVENTIL) (5 MG/ML) 0.5% nebulizer solution Take 2.5 mg by nebulization every 6 (six) hours as needed for wheezing or shortness of breath.   Yes Historical Provider, MD  Cyanocobalamin (VITAMIN B 12 PO) Place 1 tablet under the tongue daily.   Yes Historical Provider, MD  loratadine (CLARITIN) 10 MG tablet Take 1 tablet (10 mg total) by mouth daily. 02/16/14  Yes Charlane Ferretti, MD  mometasone-formoterol (DULERA) 200-5 MCG/ACT AERO Inhale 2 puffs into the lungs 2 (two) times daily. 06/29/13  Yes Dow Adolph, MD  montelukast (SINGULAIR) 10 MG tablet Take 1 tablet (10 mg total) by mouth at bedtime. 09/16/12  Yes Macy Mis, MD  Multiple Vitamin (MULITIVITAMIN WITH MINERALS) TABS Take 1 tablet by mouth daily.   Yes Historical Provider, MD  Probiotic Product (PRO-BIOTIC BLEND PO) Take 1 tablet by mouth daily.   Yes Historical Provider, MD  mometasone-formoterol (DULERA) 200-5 MCG/ACT AERO Inhale 2 puffs into the lungs 2 (two) times daily. 01/02/15   Oswaldo Conroy, PA-C  pantoprazole (PROTONIX) 20 MG tablet Take 1 tablet (20 mg total) by mouth daily. Patient not taking: Reported on 01/02/2015 02/18/14   Charlane Ferretti, MD  predniSONE (DELTASONE) 20 MG tablet 2 tabs po  daily x 4 days 01/02/15   Oswaldo ConroyVictoria Shelva Hetzer, PA-C   BP 125/65 mmHg  Pulse 80  Temp(Src) 98 F (36.7 C) (Oral)  Resp 18  Ht 5\' 6"  (1.676 m)  Wt 192 lb (87.091 kg)  BMI 31.00 kg/m2  SpO2 98% Physical Exam  Constitutional: She appears well-developed and well-nourished. No distress.  HENT:  Head: Normocephalic and atraumatic.  Eyes: Conjunctivae and EOM are normal. Right eye exhibits no discharge. Left eye exhibits no discharge.  Cardiovascular: Normal rate and regular rhythm.   Pulmonary/Chest: No respiratory distress.  Diffuse wheezing throughout. No respiratory distress but she does have increased work of breathing. Auditory wheezing present.   Abdominal: Soft. Bowel sounds are normal. She exhibits no distension. There is no tenderness.  Neurological: She is alert. She exhibits normal muscle tone. Coordination normal.  Skin: Skin is warm and dry. She is not diaphoretic.  Nursing note and vitals reviewed.   ED Course  Procedures (including critical care time) Labs Review Labs Reviewed - No data to display  Imaging Review Dg Chest 2 View  01/02/2015   CLINICAL DATA:  54 year old female with history of asthma, with asthma exacerbation lasting the past 2 weeks.  EXAM: CHEST  2 VIEW  COMPARISON:  Chest x-ray 02/15/2014.  FINDINGS: Mild diffuse peribronchial cuffing. Lung volumes are mildly hyperinflated. No consolidative airspace disease. No pleural effusions. No pneumothorax. No pulmonary nodule or mass noted. Pulmonary vasculature and the cardiomediastinal silhouette are within normal limits.  IMPRESSION: 1. Mild diffuse peribronchial cuffing which may relate to reactive airway disease or chronic bronchitis. Lungs appear mildly hyperinflated.   Electronically Signed   By: Trudie Reedaniel  Entrikin M.D.   On: 01/02/2015 11:24     EKG Interpretation None      MDM   Final diagnoses:  Asthma exacerbation   Patient presenting with asthma exacerbation for 1 week. She is having minimal relief with home inhalers and reports being out of her dulera. Patient given 2 breathing treatments in the ED with significant improvement of her symptoms as well as her lung exam. No auditory wheezing. No respiratory distress. Patient's vitals stable no tachycardia or hypoxia. No tachypnea. Patient ambulated in ED with oxygen saturation above 98%. Patient with URI symptoms no reported fevers. Chest x-ray to rule out pneumonia is negative. Patient stable for discharge. Prescription for dulera and redness around burst provided. Patient instructed to follow-up with her PCP in form them about today's asthma exacerbation.  Discussed return precautions with patient.  Discussed all results and patient verbalizes understanding and agrees with plan.  This is a shared patient. This patient was discussed with the physician who saw and evaluated the patient and agrees with the plan.     Oswaldo ConroyVictoria Jashan Cotten, PA-C 01/02/15 1145  Eber HongBrian Miller, MD 01/02/15 [phone]

## 2015-01-02 NOTE — ED Notes (Signed)
Pt walked the hall 02 was 98% heart rate was 87. Pt had no difficulty walking.

## 2015-01-02 NOTE — Discharge Instructions (Signed)
Return to the emergency room with worsening of symptoms, new symptoms or with symptoms that are concerning, especially worsening shortness of breath, cough productive of thick sputum or blood, chest pain that is a pressure, nausea vomiting, fevers not controlled with tylenol ibuprofen. Take inhalers daily and albuterol for wheezing and shortness of breath. Prednisone daily for next 4 days. Please call your doctor for a followup appointment within 24-48 hours. When you talk to your doctor please let them know that you were seen in the emergency department and have them acquire all of your records so that they can discuss the findings with you and formulate a treatment plan to fully care for your new and ongoing problems.  Read below information and follow recommendations.  Asthma Attack Prevention Although there is no way to prevent asthma from starting, you can take steps to control the disease and reduce its symptoms. Learn about your asthma and how to control it. Take an active role to control your asthma by working with your health care provider to create and follow an asthma action plan. An asthma action plan guides you in:  Taking your medicines properly.  Avoiding things that set off your asthma or make your asthma worse (asthma triggers).  Tracking your level of asthma control.  Responding to worsening asthma.  Seeking emergency care when needed. To track your asthma, keep records of your symptoms, check your peak flow number using a handheld device that shows how well air moves out of your lungs (peak flow meter), and get regular asthma checkups.  WHAT ARE SOME WAYS TO PREVENT AN ASTHMA ATTACK?  Take medicines as directed by your health care provider.  Keep track of your asthma symptoms and level of control.  With your health care provider, write a detailed plan for taking medicines and managing an asthma attack. Then be sure to follow your action plan. Asthma is an ongoing condition  that needs regular monitoring and treatment.  Identify and avoid asthma triggers. Many outdoor allergens and irritants (such as pollen, mold, cold air, and air pollution) can trigger asthma attacks. Find out what your asthma triggers are and take steps to avoid them.  Monitor your breathing. Learn to recognize warning signs of an attack, such as coughing, wheezing, or shortness of breath. Your lung function may decrease before you notice any signs or symptoms, so regularly measure and record your peak airflow with a home peak flow meter.  Identify and treat attacks early. If you act quickly, you are less likely to have a severe attack. You will also need less medicine to control your symptoms. When your peak flow measurements decrease and alert you to an upcoming attack, take your medicine as instructed and immediately stop any activity that may have triggered the attack. If your symptoms do not improve, get medical help.  Pay attention to increasing quick-relief inhaler use. If you find yourself relying on your quick-relief inhaler, your asthma is not under control. See your health care provider about adjusting your treatment. WHAT CAN MAKE MY SYMPTOMS WORSE? A number of common things can set off or make your asthma symptoms worse and cause temporary increased inflammation of your airways. Keep track of your asthma symptoms for several weeks, detailing all the environmental and emotional factors that are linked with your asthma. When you have an asthma attack, go back to your asthma diary to see which factor, or combination of factors, might have contributed to it. Once you know what these factors are, you  can take steps to control many of them. If you have allergies and asthma, it is important to take asthma prevention steps at home. Minimizing contact with the substance to which you are allergic will help prevent an asthma attack. Some triggers and ways to avoid these triggers are: Animal Dander:   Some people are allergic to the flakes of skin or dried saliva from animals with fur or feathers.   There is no such thing as a hypoallergenic dog or cat breed. All dogs or cats can cause allergies, even if they don't shed.  Keep these pets out of your home.  If you are not able to keep a pet outdoors, keep the pet out of your bedroom and other sleeping areas at all times, and keep the door closed.  Remove carpets and furniture covered with cloth from your home. If that is not possible, keep the pet away from fabric-covered furniture and carpets. Dust Mites: Many people with asthma are allergic to dust mites. Dust mites are tiny bugs that are found in every home in mattresses, pillows, carpets, fabric-covered furniture, bedcovers, clothes, stuffed toys, and other fabric-covered items.   Cover your mattress in a special dust-proof cover.  Cover your pillow in a special dust-proof cover, or wash the pillow each week in hot water. Water must be hotter than 130 F (54.4 C) to kill dust mites. Cold or warm water used with detergent and bleach can also be effective.  Wash the sheets and blankets on your bed each week in hot water.  Try not to sleep or lie on cloth-covered cushions.  Call ahead when traveling and ask for a smoke-free hotel room. Bring your own bedding and pillows in case the hotel only supplies feather pillows and down comforters, which may contain dust mites and cause asthma symptoms.  Remove carpets from your bedroom and those laid on concrete, if you can.  Keep stuffed toys out of the bed, or wash the toys weekly in hot water or cooler water with detergent and bleach. Cockroaches: Many people with asthma are allergic to the droppings and remains of cockroaches.   Keep food and garbage in closed containers. Never leave food out.  Use poison baits, traps, powders, gels, or paste (for example, boric acid).  If a spray is used to kill cockroaches, stay out of the room  until the odor goes away. Indoor Mold:  Fix leaky faucets, pipes, or other sources of water that have mold around them.  Clean floors and moldy surfaces with a fungicide or diluted bleach.  Avoid using humidifiers, vaporizers, or swamp coolers. These can spread molds through the air. Pollen and Outdoor Mold:  When pollen or mold spore counts are high, try to keep your windows closed.  Stay indoors with windows closed from late morning to afternoon. Pollen and some mold spore counts are highest at that time.  Ask your health care provider whether you need to take anti-inflammatory medicine or increase your dose of the medicine before your allergy season starts. Other Irritants to Avoid:  Tobacco smoke is an irritant. If you smoke, ask your health care provider how you can quit. Ask family members to quit smoking, too. Do not allow smoking in your home or car.  If possible, do not use a wood-burning stove, kerosene heater, or fireplace. Minimize exposure to all sources of smoke, including incense, candles, fires, and fireworks.  Try to stay away from strong odors and sprays, such as perfume, talcum powder, hair spray,  and paints.  Decrease humidity in your home and use an indoor air cleaning device. Reduce indoor humidity to below 60%. Dehumidifiers or central air conditioners can do this.  Decrease house dust exposure by changing furnace and air cooler filters frequently.  Try to have someone else vacuum for you once or twice a week. Stay out of rooms while they are being vacuumed and for a short while afterward.  If you vacuum, use a dust mask from a hardware store, a double-layered or microfilter vacuum cleaner bag, or a vacuum cleaner with a HEPA filter.  Sulfites in foods and beverages can be irritants. Do not drink beer or wine or eat dried fruit, processed potatoes, or shrimp if they cause asthma symptoms.  Cold air can trigger an asthma attack. Cover your nose and mouth with a  scarf on cold or windy days.  Several health conditions can make asthma more difficult to manage, including a runny nose, sinus infections, reflux disease, psychological stress, and sleep apnea. Work with your health care provider to manage these conditions.  Avoid close contact with people who have a respiratory infection such as a cold or the flu, since your asthma symptoms may get worse if you catch the infection. Wash your hands thoroughly after touching items that may have been handled by people with a respiratory infection.  Get a flu shot every year to protect against the flu virus, which often makes asthma worse for days or weeks. Also get a pneumonia shot if you have not previously had one. Unlike the flu shot, the pneumonia shot does not need to be given yearly. Medicines:  Talk to your health care provider about whether it is safe for you to take aspirin or non-steroidal anti-inflammatory medicines (NSAIDs). In a small number of people with asthma, aspirin and NSAIDs can cause asthma attacks. These medicines must be avoided by people who have known aspirin-sensitive asthma. It is important that people with aspirin-sensitive asthma read labels of all over-the-counter medicines used to treat pain, colds, coughs, and fever.  Beta-blockers and ACE inhibitors are other medicines you should discuss with your health care provider. HOW CAN I FIND OUT WHAT I AM ALLERGIC TO? Ask your asthma health care provider about allergy skin testing or blood testing (the RAST test) to identify the allergens to which you are sensitive. If you are found to have allergies, the most important thing to do is to try to avoid exposure to any allergens that you are sensitive to as much as possible. Other treatments for allergies, such as medicines and allergy shots (immunotherapy) are available.  CAN I EXERCISE? Follow your health care provider's advice regarding asthma treatment before exercising. It is important to  maintain a regular exercise program, but vigorous exercise or exercise in cold, humid, or dry environments can cause asthma attacks, especially for those people who have exercise-induced asthma. Document Released: 09/27/2009 Document Revised: 10/14/2013 Document Reviewed: 04/16/2013 [facility] Patient Information 2015 [place], [place]. This information is not intended to replace advice given to you by your health care provider. Make sure you discuss any questions you have with your health care provider.

## 2015-01-02 NOTE — ED Notes (Signed)
Pt presents to department for evaluation of asthma exacerbation. Ongoing x3 days. Pt states no relief with inhaler at home. Respirations unlabored. Speaking complete sentences upon arrival to ED.

## 2015-01-02 NOTE — ED Provider Notes (Signed)
The patient is a 54 year old female, history of asthma, presents with several days of worsening scratchiness in the chest, shortness of breath and wheezing, and out of her medication at home other than bronchodilators for rescue therapy, no improvement with albuterol, on exam the patient is not in distress, appears comfortable but has diffuse wheezing on expiration, no increased work of breathing, no rales, no fever. There is no tachycardia. The patient can be treated with prednisone, refill medications as needed, can follow-up in the outpatient setting. The patient is in agreement with the plan.  Medical screening examination/treatment/procedure(s) were conducted as a shared visit with non-physician practitioner(s) and myself.  I personally evaluated the patient during the encounter.  Clinical Impression:   Final diagnoses:  Asthma exacerbation         Eber HongBrian Rogena Deupree, MD 01/02/15 1557

## 2015-01-17 ENCOUNTER — Encounter (HOSPITAL_COMMUNITY): Payer: Self-pay | Admitting: Emergency Medicine

## 2015-01-17 ENCOUNTER — Emergency Department (HOSPITAL_COMMUNITY)
Admission: EM | Admit: 2015-01-17 | Discharge: 2015-01-18 | Disposition: A | Discharge status: Home / Self Care | Payer: Self-pay | Attending: Emergency Medicine | Admitting: Emergency Medicine

## 2015-01-17 ENCOUNTER — Telehealth: Payer: Self-pay | Admitting: Family Medicine

## 2015-01-17 ENCOUNTER — Emergency Department (HOSPITAL_COMMUNITY): Payer: Self-pay

## 2015-01-17 DIAGNOSIS — R05 Cough: Secondary | ICD-10-CM

## 2015-01-17 DIAGNOSIS — Z79899 Other long term (current) drug therapy: Secondary | ICD-10-CM | POA: Insufficient documentation

## 2015-01-17 DIAGNOSIS — R059 Cough, unspecified: Secondary | ICD-10-CM

## 2015-01-17 DIAGNOSIS — Z7951 Long term (current) use of inhaled steroids: Secondary | ICD-10-CM | POA: Insufficient documentation

## 2015-01-17 DIAGNOSIS — J4552 Severe persistent asthma with status asthmaticus: Secondary | ICD-10-CM | POA: Insufficient documentation

## 2015-01-17 DIAGNOSIS — Z7952 Long term (current) use of systemic steroids: Secondary | ICD-10-CM | POA: Insufficient documentation

## 2015-01-17 MED ORDER — DEXAMETHASONE SODIUM PHOSPHATE 10 MG/ML IJ SOLN
10.0000 mg | Freq: Once | INTRAMUSCULAR | Status: AC
  Administered 2015-01-17: 10 mg via INTRAMUSCULAR
  Filled 2015-01-17: qty 1

## 2015-01-17 MED ORDER — IPRATROPIUM-ALBUTEROL 0.5-2.5 (3) MG/3ML IN SOLN
RESPIRATORY_TRACT | Status: DC
Start: 2015-01-17 — End: 2015-01-18
  Filled 2015-01-17: qty 3

## 2015-01-17 MED ORDER — DEXAMETHASONE SODIUM PHOSPHATE 10 MG/ML IJ SOLN: 10.0000 mg | Freq: Once | INTRAMUSCULAR | Status: DC

## 2015-01-17 MED ORDER — ALBUTEROL (5 MG/ML) CONTINUOUS INHALATION SOLN
10.0000 mg/h | INHALATION_SOLUTION | Freq: Once | RESPIRATORY_TRACT | Status: AC
  Administered 2015-01-17: 10 mg/h via RESPIRATORY_TRACT
  Filled 2015-01-17: qty 20

## 2015-01-17 MED ORDER — IPRATROPIUM-ALBUTEROL 0.5-2.5 (3) MG/3ML IN SOLN
3.0000 mL | Freq: Once | RESPIRATORY_TRACT | Status: AC
  Administered 2015-01-17: 3 mL via RESPIRATORY_TRACT

## 2015-01-17 MED ORDER — IPRATROPIUM BROMIDE 0.02 % IN SOLN
0.5000 mg | Freq: Once | RESPIRATORY_TRACT | Status: AC
Start: 2015-01-17 — End: 2015-01-17
  Administered 2015-01-17: 0.5 mg via RESPIRATORY_TRACT
  Filled 2015-01-17: qty 2.5

## 2015-01-17 NOTE — Progress Notes (Signed)
Pre Albuterol treatment Peak Flow: PEFR: 170 FEV1: .65  Post Albuterol treatment Peak Flow: PEFR: 195 FEV1: .75  Pt preformed Peak Flow with great effort each time.

## 2015-01-17 NOTE — ED Provider Notes (Addendum)
CSN: [id number]     Arrival date & time 01/17/15  1908 History   First MD Initiated Contact with Patient 01/17/15 2040     Chief Complaint  Patient presents with  . Asthma     (Consider location/radiation/quality/duration/timing/severity/associated sxs/prior Treatment) HPI Comments: Pt comes in with cc of wheezing. Pt has hx of asthma. Reports that she has been having trouble breathing for few days now, but with the weather change, her symptoms got really bad again 2 days ago. Pt has cough, clear phlegm and pleuritic chest pain. Pt has no n/v/f/c. No flu like symptoms.   ROS 10 Systems reviewed and are negative for acute change except as noted in the HPI.     Patient is a 54 y.o. female presenting with asthma. The history is provided by the patient.  Asthma Associated symptoms include shortness of breath.    Past Medical History  Diagnosis Date  . Asthma     Started as a child; Has been intubated for asthma exacerbation twice in approx 1990  . Shortness of breath     "related to asthma attacks" (09/19/2012)   Past Surgical History  Procedure Laterality Date  . Hand      flexor tendon repair  . Tubal ligation  ~ 1985   Family History  Problem Relation Age of Onset  . Asthma Mother   . Heart disease Father 264    MI  . Alcohol abuse Father   . Asthma Sister 9    death from asthma   History  Substance Use Topics  . Smoking status: Never Smoker   . Smokeless tobacco: Never Used  . Alcohol Use: No   OB History    No data available     Review of Systems  Respiratory: Positive for cough, chest tightness, shortness of breath and wheezing.   All other systems reviewed and are negative.     Allergies  Review of patient's allergies indicates no known allergies.  Home Medications   Prior to Admission medications   Medication Sig Start Date End Date Taking? Authorizing Provider  albuterol (PROVENTIL HFA;VENTOLIN HFA) 108 (90 BASE) MCG/ACT inhaler Inhale 2 puffs  into the lungs every 4 (four) hours as needed for wheezing or shortness of breath (cough). every 4-6 hours for the next 24 02/16/14  Yes Charlane FerrettiMelanie C Marsh, MD  albuterol (PROVENTIL) (5 MG/ML) 0.5% nebulizer solution Take 2.5 mg by nebulization every 6 (six) hours as needed for wheezing or shortness of breath.   Yes Historical Provider, MD  Cyanocobalamin (VITAMIN B 12 PO) Place 1 tablet under the tongue daily.   Yes Historical Provider, MD  loratadine (CLARITIN) 10 MG tablet Take 1 tablet (10 mg total) by mouth daily. 02/16/14  Yes Charlane FerrettiMelanie C Marsh, MD  methylPREDNISolone (MEDROL DOSEPAK) 4 MG tablet Take 4 mg by mouth daily. follow package directions 01/12/15  Yes Historical Provider, MD  Multiple Vitamin (MULITIVITAMIN WITH MINERALS) TABS Take 1 tablet by mouth daily.   Yes Historical Provider, MD  predniSONE (DELTASONE) 20 MG tablet 2 tabs po daily x 4 days Patient taking differently: Take 20 mg by mouth See admin instructions.  01/02/15  Yes Oswaldo ConroyVictoria Creech, PA-C  Probiotic Product (PRO-BIOTIC BLEND PO) Take 1 tablet by mouth daily.   Yes Historical Provider, MD  mometasone-formoterol (DULERA) 200-5 MCG/ACT AERO Inhale 2 puffs into the lungs 2 (two) times daily. 06/29/13   Dow Adolphichard Kazibwe, MD  mometasone-formoterol [facility](DULERA) 200-5 MCG/ACT AERO Inhale 2 puffs into the lungs 2 (two)  times daily. 01/02/15   Oswaldo Conroy, PA-C  montelukast (SINGULAIR) 10 MG tablet Take 1 tablet (10 mg total) by mouth at bedtime. 09/16/12   Macy Mis, MD  pantoprazole (PROTONIX) 20 MG tablet Take 1 tablet (20 mg total) by mouth daily. Patient not taking: Reported on 01/02/2015 02/18/14   Charlane Ferretti, MD   BP 120/60 mmHg  Pulse 74  Temp(Src) 98.1 F (36.7 C) (Oral)  Resp 22  Ht  (1.676 m)  Wt 192 lb (87.091 kg)  BMI 31.00 kg/m2  SpO2 96% Physical Exam  Constitutional: She is oriented to person, place, and time. She appears well-developed and well-nourished.  HENT:  Head: Normocephalic and atraumatic.   Eyes: EOM are normal. Pupils are equal, round, and reactive to light.  Neck: Neck supple.  Cardiovascular: Normal rate, regular rhythm and normal heart sounds.   No murmur heard. Pulmonary/Chest: Effort normal. No respiratory distress. She has wheezes. She has no rales.  Diffuse wheezing and rhonchus breath sounds.  Abdominal: Soft. She exhibits no distension. There is no tenderness. There is no rebound and no guarding.  Neurological: She is alert and oriented to person, place, and time.  Skin: Skin is warm and dry.  Nursing note and vitals reviewed.   ED Course  Procedures (including critical care time) Labs Review Labs Reviewed  CBC WITH DIFFERENTIAL/PLATELET  I-STAT CHEM 8, ED    Imaging Review No results found.   EKG Interpretation None     :00 - pt is still having wheezing, she is s/p 1 full treatment, and hour long treatment. She has no hypoxia when at rest, but feels winded when she moves just a trace amount. Another hour long treatment ordered. Will need reassessment, if not better, will be admitted. MDM   Final diagnoses:  Cough  Status asthmaticus, severe persistent   Pt comes in with cc of wheezing. Pt has hx of asthma, not a smoker. She has diffuse wheezing, and pleuritic chest pain and cough. Will start giving meds for asthma exacerbation. Will reassess. PE was considered in the ddx, but pt has no hx of PE, DVT and denies any exogenous estrogen use, long distance travels or surgery in the past 6 weeks, active cancer, recent immobilization.  :00 - Dr. Gwendolyn Grant will assume care. Pt is requesting advair instead of dulera, as the latter is > $300 at [facility].  CRITICAL CARE Performed by: Derwood Kaplan   Total critical care time: 35 minutes, status asthmaticus with multiple reassessments  Critical care time was exclusive of separately billable procedures and treating other patients.  Critical care was necessary to treat or prevent imminent or  life-threatening deterioration.  Critical care was time spent personally by me on the following activities: development of treatment plan with patient and/or surrogate as well as nursing, discussions with consultants, evaluation of patient's response to treatment, examination of patient, obtaining history from patient or surrogate, ordering and performing treatments and interventions, ordering and review of laboratory studies, ordering and review of radiographic studies, pulse oximetry and re-evaluation of patient's condition.   Derwood Kaplan, MD 01/18/15 1610  Derwood Kaplan, MD 01/18/15 [id number]

## 2015-01-17 NOTE — Telephone Encounter (Signed)
Emergency Line / After Hours Call  Hx of asthma and given prednisone on last appt. She had some improvement after this but feels worse again. 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 Script for [employer] but cost [phone] and can't afford it. She feels about the same as compared to before receiving prednisone. Used albuterol at 9:30 am and could tell a difference for a short peroid. She reports having a hard time breathing but is ok if she is lying in bed not moving around.     She is speaking in full sentences on the phone with no gasping. Recommending that she can either use albuterol every 4 hours and stay home, pending a clinic call tomorrow morning to be seen or she could come into the hospital. She decided to stay home and will call clinic tomorrow morning. She was given warnings and understood the need for more emergent treatment.  Clare GandyJeremy Schmitz, MD Family Medicine PGY-2

## 2015-01-17 NOTE — ED Notes (Signed)
Paged RT for continuous neb

## 2015-01-17 NOTE — ED Notes (Signed)
Pt reports ongoing wheezing/ cold sx x 2 weeks. Seen here for same 1 week ago and given prednisone- no relief. Breathing tx are not helping either.

## 2015-01-17 NOTE — ED Notes (Signed)
Pt returned from xray

## 2015-01-18 LAB — CBC WITH DIFFERENTIAL/PLATELET
Basophils Absolute: 0 10*3/uL (ref 0.0–0.1)
Basophils Relative: 0 % (ref 0–1)
Eosinophils Absolute: 0 10*3/uL (ref 0.0–0.7)
Eosinophils Relative: 0 % (ref 0–5)
HCT: 39.2 % (ref 36.0–46.0)
HEMOGLOBIN: 13.3 g/dL (ref 12.0–15.0)
LYMPHS ABS: 1.2 10*3/uL (ref 0.7–4.0)
Lymphocytes Relative: 21 % (ref 12–46)
MCH: 28.3 pg (ref 26.0–34.0)
MCHC: 33.9 g/dL (ref 30.0–36.0)
MCV: 83.4 fL (ref 78.0–100.0)
MONOS PCT: 8 % (ref 3–12)
Monocytes Absolute: 0.5 10*3/uL (ref 0.1–1.0)
NEUTROS PCT: 71 % (ref 43–77)
Neutro Abs: 4 10*3/uL (ref 1.7–7.7)
Platelets: 321 10*3/uL (ref 150–400)
RBC: 4.7 MIL/uL (ref 3.87–5.11)
RDW: 15.1 % (ref 11.5–15.5)
WBC: 5.6 10*3/uL (ref 4.0–10.5)

## 2015-01-18 LAB — I-STAT CHEM 8, ED
BUN: 13 mg/dL (ref 6–23)
CHLORIDE: 107 mmol/L (ref 96–112)
CREATININE: 0.7 mg/dL (ref 0.50–1.10)
Calcium, Ion: 1.14 mmol/L (ref 1.12–1.23)
Glucose, Bld: 126 mg/dL — ABNORMAL HIGH (ref 70–99)
HCT: 42 % (ref 36.0–46.0)
HEMOGLOBIN: 14.3 g/dL (ref 12.0–15.0)
POTASSIUM: 3.7 mmol/L (ref 3.5–5.1)
SODIUM: 139 mmol/L (ref 135–145)
TCO2: 18 mmol/L (ref 0–100)

## 2015-01-18 MED ORDER — ALBUTEROL (5 MG/ML) CONTINUOUS INHALATION SOLN
10.0000 mg/h | INHALATION_SOLUTION | Freq: Once | RESPIRATORY_TRACT | Status: AC
  Administered 2015-01-18: 10 mg/h via RESPIRATORY_TRACT

## 2015-01-18 MED ORDER — PREDNISONE 50 MG PO TABS: 50.0000 mg | ORAL_TABLET | Freq: Every day | ORAL | Status: DC

## 2015-01-18 MED ORDER — ALBUTEROL SULFATE HFA 108 (90 BASE) MCG/ACT IN AERS: 2.0000 | INHALATION_SPRAY | Freq: Four times a day (QID) | RESPIRATORY_TRACT | Status: DC | PRN

## 2015-01-18 MED ORDER — FLUTICASONE-SALMETEROL 100-50 MCG/DOSE IN AEPB: 1.0000 | INHALATION_SPRAY | Freq: Two times a day (BID) | RESPIRATORY_TRACT | Status: DC

## 2015-01-18 NOTE — ED Notes (Signed)
Pt receiving continuous neb tx at this time. Will ambulate pt with pulse ox when tx is complete.

## 2015-01-18 NOTE — ED Notes (Signed)
RT at bedside to start continuous neb tx.

## 2015-01-18 NOTE — ED Notes (Signed)
MD Walden at bedside 

## 2015-01-18 NOTE — ED Provider Notes (Signed)
1230 am - Care from Dr. Rhunette CroftNanavati - patient awaiting 2nd hour of albuterol. Feeling better, still some wheezing on auscultation, but no respiratory distress. Patient did not display any hypoxia with ambulation. Stable for discharge.  1. Status asthmaticus, severe persistent   2. Cough      [name], MD 01/18/15 Earle [id number]

## 2015-01-18 NOTE — Discharge Instructions (Signed)
We saw you in the ER for your asthma related complains. We gave you some breathing treatments in the ER, and seems like your symptoms have improved. Please take albuterol as needed every 4 hours. Please take the medications prescribed. Please refrain from smoking or smoke exposure. Please see a primary care doctor in 1 week. Return to the ER if your symptoms worsen.   Asthma Asthma is a recurring condition in which the airways tighten and narrow. Asthma can make it difficult to breathe. It can cause coughing, wheezing, and shortness of breath. Asthma episodes, also called asthma attacks, range from minor to life-threatening. Asthma cannot be cured, but medicines and lifestyle changes can help control it. CAUSES Asthma is believed to be caused by inherited (genetic) and environmental factors, but its exact cause is unknown. Asthma may be triggered by allergens, lung infections, or irritants in the air. Asthma triggers are different for each person. Common triggers include:   Animal dander.  Dust mites.  Cockroaches.  Pollen from trees or grass.  Mold.  Smoke.  Air pollutants such as dust, household cleaners, hair sprays, aerosol sprays, paint fumes, strong chemicals, or strong odors.  Cold air, weather changes, and winds (which increase molds and pollens in the air).  Strong emotional expressions such as crying or laughing hard.  Stress.  Certain medicines (such as aspirin) or types of drugs (such as beta-blockers).  Sulfites in foods and drinks. Foods and drinks that may contain sulfites include dried fruit, potato chips, and sparkling grape juice.  Infections or inflammatory conditions such as the flu, a cold, or an inflammation of the nasal membranes (rhinitis).  Gastroesophageal reflux disease (GERD).  Exercise or strenuous activity. SYMPTOMS Symptoms may occur immediately after asthma is triggered or many hours later. Symptoms include:  Wheezing.  Excessive  nighttime or early morning coughing.  Frequent or severe coughing with a common cold.  Chest tightness.  Shortness of breath. DIAGNOSIS  The diagnosis of asthma is made by a review of your medical history and a physical exam. Tests may also be performed. These may include:  Lung function studies. These tests show how much air you breathe in and out.  Allergy tests.  Imaging tests such as X-rays. TREATMENT  Asthma cannot be cured, but it can usually be controlled. Treatment involves identifying and avoiding your asthma triggers. It also involves medicines. There are 2 classes of medicine used for asthma treatment:   Controller medicines. These prevent asthma symptoms from occurring. They are usually taken every day.  Reliever or rescue medicines. These quickly relieve asthma symptoms. They are used as needed and provide short-term relief. Your health care provider will help you create an asthma action plan. An asthma action plan is a written plan for managing and treating your asthma attacks. It includes a list of your asthma triggers and how they may be avoided. It also includes information on when medicines should be taken and when their dosage should be changed. An action plan may also involve the use of a device called a peak flow meter. A peak flow meter measures how well the lungs are working. It helps you monitor your condition. HOME CARE INSTRUCTIONS   Take medicines only as directed by your health care provider. Speak with your health care provider if you have questions about how or when to take the medicines.  Use a peak flow meter as directed by your health care provider. Record and keep track of readings.  Understand and use the action  plan to help minimize or stop an asthma attack without needing to seek medical care.  Control your home environment in the following ways to help prevent asthma attacks:  Do not smoke. Avoid being exposed to secondhand smoke.  Change your  heating and air conditioning filter regularly.  Limit your use of fireplaces and wood stoves.  Get rid of pests (such as roaches and mice) and their droppings.  Throw away plants if you see mold on them.  Clean your floors and dust regularly. Use unscented cleaning products.  Try to have someone else vacuum for you regularly. Stay out of rooms while they are being vacuumed and for a short while afterward. If you vacuum, use a dust mask from a hardware store, a double-layered or microfilter vacuum cleaner bag, or a vacuum cleaner with a HEPA filter.  Replace carpet with wood, tile, or vinyl flooring. Carpet can trap dander and dust.  Use allergy-proof pillows, mattress covers, and box spring covers.  Wash bed sheets and blankets every week in hot water and dry them in a dryer.  Use blankets that are made of polyester or cotton.  Clean bathrooms and kitchens with bleach. If possible, have someone repaint the walls in these rooms with mold-resistant paint. Keep out of the rooms that are being cleaned and painted.  Wash hands frequently. SEEK MEDICAL CARE IF:   You have wheezing, shortness of breath, or a cough even if taking medicine to prevent attacks.  The colored mucus you cough up (sputum) is thicker than usual.  Your sputum changes from clear or white to yellow, green, gray, or bloody.  You have any problems that may be related to the medicines you are taking (such as a rash, itching, swelling, or trouble breathing).  You are using a reliever medicine more than 2-3 times per week.  Your peak flow is still at 50-79% of your personal best after following your action plan for 1 hour.  You have a fever. SEEK IMMEDIATE MEDICAL CARE IF:   You seem to be getting worse and are unresponsive to treatment during an asthma attack.  You are short of breath even at rest.  You get short of breath when doing very little physical activity.  You have difficulty eating, drinking, or  talking due to asthma symptoms.  You develop chest pain.  You develop a fast heartbeat.  You have a bluish color to your lips or fingernails.  You are light-headed, dizzy, or faint.  Your peak flow is less than 50% of your personal best. MAKE SURE YOU:   Understand these instructions.  Will watch your condition.  Will get help right away if you are not doing well or get worse. Document Released: 10/09/2005 Document Revised: 02/23/2014 Document Reviewed: 05/08/2013 [facility] Patient Information 2015 [place], [place]. This information is not intended to replace advice given to you by your health care provider. Make sure you discuss any questions you have with your health care provider.

## 2015-02-18 ENCOUNTER — Ambulatory Visit (INDEPENDENT_AMBULATORY_CARE_PROVIDER_SITE_OTHER): Payer: Self-pay | Admitting: Family Medicine

## 2015-02-18 ENCOUNTER — Encounter: Payer: Self-pay | Admitting: Family Medicine

## 2015-02-18 VITALS — BP 129/83 | HR 80 | Temp 99.1°F | Ht 66.0 in | Wt 176.1 lb

## 2015-02-18 DIAGNOSIS — J45901 Unspecified asthma with (acute) exacerbation: Secondary | ICD-10-CM

## 2015-02-18 MED ORDER — AZITHROMYCIN 250 MG PO TABS: ORAL_TABLET | ORAL | Status: DC

## 2015-02-18 MED ORDER — PREDNISONE 50 MG PO TABS: ORAL_TABLET | ORAL | Status: DC

## 2015-02-18 MED ORDER — MOMETASONE FURO-FORMOTEROL FUM 200-5 MCG/ACT IN AERO: 2.0000 | INHALATION_SPRAY | Freq: Two times a day (BID) | RESPIRATORY_TRACT | Status: DC

## 2015-02-18 NOTE — Patient Instructions (Addendum)
Thank you for coming in, today!  I will treat you for an asthma exacerbation. I want you to take prednisone 50 mg for 5 days. I also want you to take azithromycin (an antibiotic) for 5 days. Follow the instructions on your prescriptions.  I will give you a Dulera sample, today. Use 2 pumps twice per day -- the inhaler should last for 2 weeks.  Never use your albuterol more than 10-12 pumps in 1 hour. If you are needing that much or more, call or come to the clinic or the emergency room. Using it that much and very often can get to the point where it does not help at all.  Call or come back to see Dr. Pollie MeyerMcIntyre in the next week or two. She can help you figure out if you can or need to be referred to the pulmonary doctors. Sometimes they can help with getting prescriptions for inhalers  Please feel free to call with any questions or concerns at any time, at [phone]. --Dr. Casper HarrisonStreet

## 2015-02-18 NOTE — Progress Notes (Signed)
Subjective:    Patient ID: Lydia Griffin, female    DOB: [birth year], 54 y.o.   MRN: [id number]  HPI: Pt presents to clinic with complaint of "asthma attack." She complains of cough, wheezing, and SOB with tightness in her breathing since about 2 AM. She has had no fever, vomiting, or chest pain. She has a mild "scratchy throat." She is using albuterol very frequently; she used upwards of 100 pumps yesterday alone. She feels it and albuterol nebulizer treatments at home have not been helpful. About 1 month ago, she had a similar "attack" that was treated with a steroid burst. Her symptoms slowly cleared up and now have worsened.   Of note, pt is in the process of changing her insurance. She has been trying to get [facility] for about 1 month but has been unable to get it / afford it despite trying to use coupons; previously she was on Advair and changed to [facility] after an admission to the hospital but she ran out of the [facility] sample she got after discharge. She is otherwise on Singulair daily.  Review of Systems: As above. Pt works at [employer] and has a few sick contacts.     Objective:   Physical Exam BP 129/83 mmHg  Pulse 80  Temp(Src) 99.1 F (37.3 C) (Oral)  Ht 5\' 6"  (1.676 m)  Wt 176 lb 1.6 oz (79.878 kg)  BMI 28.44 kg/m2  SpO2 94% Gen: adult female with obvious mild respiratory distress HEENT: [place]/AT, EOMI, PERRLA, MMM, TM's clear bilaterally  Nasal mucosae and posterior oropharynx mildly red  No frank tonsillar swelling / exudates Neck: supple, normal ROM, minimal shotty lymphadenopathy Cardio: RRR, no murmur appreciated Pulm: diffuse coarse wheezes without focal consolidation  Airways with decreased but equal movement bilaterally  Heavy cough throughout interview and with deep breathing for exam  Despite this exam, pt sats >93% on room air and speaks in full sentences Abd: soft, nontender, BS+ Ext: warm, well-perfused, no LE edema     Assessment & Plan:  54yo female with  acute asthma exacerbation and likely tachyphylaxis with extreme overuse of albuterol MDI yesterday - does not appear in extremis nor does she appear to be in impending severe respiratory failure - no fever or other signs / symptoms to suggest frank PNA  Plan: - sample for [facility] given (2 weeks worth of therapy) with instruction to start immediately - Rx for oral prednisone burst and course of azithromycin given severity of exacerbation - continue Singulair without change - counseled extensively on proper use of albuterol; explained concept of tachyphylaxis and danger of over-using rescue medication - instructed pt to call or present to the ED if she ever uses more than 8-12 pumps of albuterol in 1 hour - instructed pt to f/u with her insurance changes and to call Dr. Pollie Meyer or return to clinic in 1-2 weeks (before [facility] runs out) - could consider referral to pulmonology to see if they can help with medication assistance / samples, etc - otherwise f/u as needed  Note FYI to Dr. Levora Dredge, MD PGY-3, [facility] 02/18/2015, 12:21 PM

## 2015-02-19 ENCOUNTER — Telehealth: Payer: Self-pay | Admitting: Family Medicine

## 2015-02-19 NOTE — Telephone Encounter (Signed)
Will forward to MD seen yesterday 

## 2015-02-19 NOTE — Telephone Encounter (Signed)
Pt called because she needs a letter for work stating that she needs to be on light duty for now. Please call when ready to pick up. jw

## 2015-02-19 NOTE — Telephone Encounter (Signed)
Pt called back and would like us to now fax this to her employer [phone]. jw

## 2015-02-22 NOTE — Telephone Encounter (Signed)
Addendum: Attempted to fax letter to number provided but was unsuccessful (unsure if number is incorrect). Please inform pt she can pick up a copy of the letter at the front desk at her convenience. Thanks. --CMS

## 2015-02-22 NOTE — Telephone Encounter (Signed)
LM for patient to call back. Jazmin Hartsell,CMA  

## 2015-02-22 NOTE — Telephone Encounter (Signed)
Red Team: Please let pt know letter will be faxed this morning; light duty for 2 weeks, then f/u with Dr. Pollie MeyerMcIntyre. Thanks. --CMS

## 2015-02-24 ENCOUNTER — Emergency Department (HOSPITAL_COMMUNITY)
Admission: EM | Admit: 2015-02-24 | Discharge: 2015-02-25 | Disposition: A | Discharge status: Home / Self Care | Payer: Self-pay | Attending: Emergency Medicine | Admitting: Emergency Medicine

## 2015-02-24 ENCOUNTER — Encounter (HOSPITAL_COMMUNITY): Payer: Self-pay | Admitting: *Deleted

## 2015-02-24 ENCOUNTER — Emergency Department (HOSPITAL_COMMUNITY): Payer: Self-pay

## 2015-02-24 DIAGNOSIS — Z7951 Long term (current) use of inhaled steroids: Secondary | ICD-10-CM | POA: Insufficient documentation

## 2015-02-24 DIAGNOSIS — J9801 Acute bronchospasm: Secondary | ICD-10-CM

## 2015-02-24 DIAGNOSIS — J45901 Unspecified asthma with (acute) exacerbation: Secondary | ICD-10-CM | POA: Insufficient documentation

## 2015-02-24 DIAGNOSIS — Z79899 Other long term (current) drug therapy: Secondary | ICD-10-CM | POA: Insufficient documentation

## 2015-02-24 MED ORDER — MAGNESIUM SULFATE 2 GM/50ML IV SOLN
2.0000 g | Freq: Once | INTRAVENOUS | Status: AC
  Administered 2015-02-24: 2 g via INTRAVENOUS
  Filled 2015-02-24 (×2): qty 50

## 2015-02-24 MED ORDER — ALBUTEROL (5 MG/ML) CONTINUOUS INHALATION SOLN
10.0000 mg/h | INHALATION_SOLUTION | Freq: Once | RESPIRATORY_TRACT | Status: AC
  Administered 2015-02-24: 10 mg/h via RESPIRATORY_TRACT
  Filled 2015-02-24: qty 20

## 2015-02-24 MED ORDER — SODIUM CHLORIDE 0.9 % IV BOLUS (SEPSIS)
1000.0000 mL | Freq: Once | INTRAVENOUS | Status: AC
  Administered 2015-02-24: 1000 mL via INTRAVENOUS

## 2015-02-24 MED ORDER — IPRATROPIUM-ALBUTEROL 0.5-2.5 (3) MG/3ML IN SOLN
3.0000 mL | Freq: Once | RESPIRATORY_TRACT | Status: AC
  Administered 2015-02-24: 3 mL via RESPIRATORY_TRACT

## 2015-02-24 MED ORDER — IPRATROPIUM-ALBUTEROL 0.5-2.5 (3) MG/3ML IN SOLN
RESPIRATORY_TRACT | Status: AC
  Filled 2015-02-24: qty 3

## 2015-02-24 MED ORDER — METHYLPREDNISOLONE SODIUM SUCC 125 MG IJ SOLR
125.0000 mg | Freq: Once | INTRAMUSCULAR | Status: AC
  Administered 2015-02-24: 125 mg via INTRAVENOUS
  Filled 2015-02-24: qty 2

## 2015-02-24 MED ORDER — IPRATROPIUM BROMIDE 0.02 % IN SOLN
0.5000 mg | Freq: Once | RESPIRATORY_TRACT | Status: AC
  Administered 2015-02-24: 0.5 mg via RESPIRATORY_TRACT
  Filled 2015-02-24: qty 2.5

## 2015-02-24 NOTE — ED Notes (Signed)
Pt reports asthma/cold symptoms for a week. Pt reports no relief from home nebs, inhalers and prednisone. Last dose of prednisone was Monday. Pt speaking in complete full and sentences, mild audible wheezes noted.

## 2015-02-24 NOTE — ED Provider Notes (Signed)
CSN: [id number]     Arrival date & time 02/24/15  1828 History   First MD Initiated Contact with Patient 02/24/15 2034     Chief Complaint  Patient presents with  . Asthma  . URI     (Consider location/radiation/quality/duration/timing/severity/associated sxs/prior Treatment) HPI Lydia Griffin is a 54 y.o. female with history of asthma, presents to emergency room complaining of cough and wheezing. States she has been short of breath for over a week. Last week she went to a primary care doctor, was given prednisone taper, Z-Pak, she has been using nebulizer machine and inhaler every 2-3 hours. She continues to wheeze and feel shortness of breath. She states is worse on exertion. She denies any chest pain. No fever or chills. No other complaints.  Past Medical History  Diagnosis Date  . Asthma     Started as a child; Has been intubated for asthma exacerbation twice in approx 1990  . Shortness of breath     "related to asthma attacks" (09/19/2012)   Past Surgical History  Procedure Laterality Date  . Hand      flexor tendon repair  . Tubal ligation  ~ 1985   Family History  Problem Relation Age of Onset  . Asthma Mother   . Heart disease Father [id number]    MI  . Alcohol abuse Father   . Asthma Sister 9    death from asthma   History  Substance Use Topics  . Smoking status: Never Smoker   . Smokeless tobacco: Never Used  . Alcohol Use: No   OB History    No data available     Review of Systems  Constitutional: Negative for fever and chills.  HENT: Positive for congestion and sneezing. Negative for sore throat.   Respiratory: Positive for cough, chest tightness, shortness of breath and wheezing.   Cardiovascular: Negative for chest pain, palpitations and leg swelling.  Gastrointestinal: Negative for nausea, vomiting, abdominal pain and diarrhea.  Genitourinary: Negative for dysuria and flank pain.  Musculoskeletal: Negative for myalgias, arthralgias, neck pain and neck  stiffness.  Skin: Negative for rash.  Neurological: Negative for dizziness, weakness and headaches.  All other systems reviewed and are negative.     Allergies  Review of patient's allergies indicates no known allergies.  Home Medications   Prior to Admission medications   Medication Sig Start Date End Date Taking? Authorizing Provider  albuterol (PROVENTIL HFA;VENTOLIN HFA) 108 (90 BASE) MCG/ACT inhaler Inhale 2 puffs into the lungs every 4 (four) hours as needed for wheezing or shortness of breath (cough). every 4-6 hours for the next 24 02/16/14  Yes Charlane FerrettiMelanie C Marsh, MD  albuterol (PROVENTIL) (5 MG/ML) 0.5% nebulizer solution Take 2.5 mg by nebulization every 6 (six) hours as needed for wheezing or shortness of breath.   Yes Historical Provider, MD  Cyanocobalamin (VITAMIN B 12 PO) Place 1 tablet under the tongue daily.   Yes Historical Provider, MD  Fluticasone-Salmeterol (ADVAIR DISKUS) 100-50 MCG/DOSE AEPB Inhale 1 puff into the lungs 2 (two) times daily. 01/18/15  Yes Ankit Rhunette CroftNanavati, MD  mometasone-formoterol (DULERA) 200-5 MCG/ACT AERO Inhale 2 puffs into the lungs 2 (two) times daily. 02/18/15  Yes Stephanie Couphristopher M Street, MD  montelukast (SINGULAIR) 10 MG tablet Take 1 tablet (10 mg total) by mouth at bedtime. Patient taking differently: Take 10 mg by mouth daily.  09/16/12  Yes Macy MisKim K Briscoe, MD  Multiple Vitamin (MULITIVITAMIN WITH MINERALS) TABS Take 1 tablet by mouth daily.  Yes Historical Provider, MD  Probiotic Product (PRO-BIOTIC BLEND PO) Take 1 tablet by mouth daily.   Yes Historical Provider, MD  albuterol (PROVENTIL HFA;VENTOLIN HFA) 108 (90 BASE) MCG/ACT inhaler Inhale 2 puffs into the lungs every 6 (six) hours as needed for wheezing or shortness of breath. Patient not taking: Reported on 02/24/2015 01/18/15   Derwood KaplanAnkit Nanavati, MD  azithromycin (ZITHROMAX) 250 MG tablet Take two pills on day 1, then one pill per day on days 2-5. Patient not taking: Reported on 02/24/2015 02/18/15    Stephanie Couphristopher M Street, MD  loratadine (CLARITIN) 10 MG tablet Take 1 tablet (10 mg total) by mouth daily. Patient not taking: Reported on 02/24/2015 02/16/14   Charlane FerrettiMelanie C Marsh, MD  pantoprazole (PROTONIX) 20 MG tablet Take 1 tablet (20 mg total) by mouth daily. Patient not taking: Reported on 01/02/2015 02/18/14   Charlane FerrettiMelanie C Marsh, MD  predniSONE (DELTASONE) 50 MG tablet Take one pill every day for 5 days. Patient not taking: Reported on 02/24/2015 02/18/15   Stephanie Couphristopher M Street, MD   BP 142/68 mmHg  Pulse 71  Temp(Src) 98.4 F (36.9 C) (Oral)  Resp 18  Ht 5\' 6"  (1.676 m)  Wt 176 lb (79.833 kg)  BMI 28.42 kg/m2  SpO2 94% Physical Exam  Constitutional: She is oriented to person, place, and time. She appears well-developed and well-nourished. No distress.  HENT:  Head: Normocephalic.  Eyes: Conjunctivae are normal.  Neck: Normal range of motion. Neck supple.  Cardiovascular: Normal rate, regular rhythm and normal heart sounds.   Pulmonary/Chest: Effort normal. No respiratory distress. She has wheezes. She has no rales.  Inspiratory and expiratory wheezes bilaterally  Abdominal: Soft. Bowel sounds are normal. She exhibits no distension. There is no tenderness. There is no rebound.  Musculoskeletal: She exhibits no edema.  Neurological: She is alert and oriented to person, place, and time.  Skin: Skin is warm and dry.  Psychiatric: She has a normal mood and affect. Her behavior is normal.  Nursing note and vitals reviewed.   ED Course  Procedures (including critical care time) Labs Review Labs Reviewed - No data to display  Imaging Review Dg Chest 2 View  02/24/2015   CLINICAL DATA:  Shortness of Breath  EXAM: CHEST  2 VIEW  COMPARISON:  01/17/2015  FINDINGS: Cardiac shadow is within normal limits. The lungs are well aerated bilaterally with mild peribronchial changes stable from the previous exam. No focal infiltrate is seen. No sizable effusion is noted. No bony abnormality is noted.   IMPRESSION: Stable chronic bronchitic changes.  No acute abnormality is noted.   Electronically Signed   By: Alcide CleverMark  Lukens M.D.   On: 02/24/2015 19:33     EKG Interpretation None      MDM   Final diagnoses:  Acute bronchospasm    patient with asthma exacerbation. History of the same. Patient with his doctor or wheezes. Actually ambulated patient myself with pulse ox after first breathing treatment and she maintained oxygen saturation above 95% on room air. She does feel tight on exam and having wheezing. I given her an hour-long neb. She states she felt better but continued to wheeze and sound tight. Will try magnesium. She also received 100.5 mg of Solu-Medrol IV.   12:26 AM Patient is feeling better after magnesium. She is comfortable with going home, she has nebulizer machine at home. Will restart on prednisone again. She will do treatments every 4 hours. Instructed to return if symptoms are worsening.  12:54 AM  Patient reassessed again, continues to maintain oxygen saturation in the high 90s. She is feeling much better. She continues to have wheezing however. Peak flow completed at 155, improvement from 90 when she first got here. I offered her admission, however she refused. She stated she wanted to go home where she is more comfortable. She'll continue neb treatments there. I have advised her to come back of her symptoms are getting worse.  Filed Vitals:   02/24/15 1840  BP: 142/68  Pulse: 71  Temp: 98.4 F (36.9 C)  TempSrc: Oral  Resp: 18  Height:  (1.676 m)  Weight: 176 lb (79.833 kg)  SpO2: 94%     Jaynie Crumble, PA-C 02/25/15 0100  Bethann Berkshire, MD 02/25/15 1324

## 2015-02-25 MED ORDER — PREDNISONE 10 MG PO TABS: ORAL_TABLET | ORAL | Status: DC

## 2015-02-25 NOTE — Discharge Instructions (Signed)
Continue breathing treatments at home. Take prednisone as prescribed until all gone, next dose tomorrow morning. Will up with your doctor closely. Return if worsening.   Asthma, Acute Bronchospasm Acute bronchospasm caused by asthma is also referred to as an asthma attack. Bronchospasm means your air passages become narrowed. The narrowing is caused by inflammation and tightening of the muscles in the air tubes (bronchi) in your lungs. This can make it hard to breathe or cause you to wheeze and cough. CAUSES Possible triggers are:  Animal dander from the skin, hair, or feathers of animals.  Dust mites contained in house dust.  Cockroaches.  Pollen from trees or grass.  Mold.  Cigarette or tobacco smoke.  Air pollutants such as dust, household cleaners, hair sprays, aerosol sprays, paint fumes, strong chemicals, or strong odors.  Cold air or weather changes. Cold air may trigger inflammation. Winds increase molds and pollens in the air.  Strong emotions such as crying or laughing hard.  Stress.  Certain medicines such as aspirin or beta-blockers.  Sulfites in foods and drinks, such as dried fruits and wine.  Infections or inflammatory conditions, such as a flu, cold, or inflammation of the nasal membranes (rhinitis).  Gastroesophageal reflux disease (GERD). GERD is a condition where stomach acid backs up into your esophagus.  Exercise or strenuous activity. SIGNS AND SYMPTOMS   Wheezing.  Excessive coughing, particularly at night.  Chest tightness.  Shortness of breath. DIAGNOSIS  Your health care provider will ask you about your medical history and perform a physical exam. A chest X-ray or blood testing may be performed to look for other causes of your symptoms or other conditions that may have triggered your asthma attack. TREATMENT  Treatment is aimed at reducing inflammation and opening up the airways in your lungs. Most asthma attacks are treated with inhaled  medicines. These include quick relief or rescue medicines (such as bronchodilators) and controller medicines (such as inhaled corticosteroids). These medicines are sometimes given through an inhaler or a nebulizer. Systemic steroid medicine taken by mouth or given through an IV tube also can be used to reduce the inflammation when an attack is moderate or severe. Antibiotic medicines are only used if a bacterial infection is present.  HOME CARE INSTRUCTIONS   Rest.  Drink plenty of liquids. This helps the mucus to remain thin and be easily coughed up. Only use caffeine in moderation and do not use alcohol until you have recovered from your illness.  Do not smoke. Avoid being exposed to secondhand smoke.  You play a critical role in keeping yourself in good health. Avoid exposure to things that cause you to wheeze or to have breathing problems.  Keep your medicines up-to-date and available. Carefully follow your health care provider's treatment plan.  Take your medicine exactly as prescribed.  When pollen or pollution is bad, keep windows closed and use an air conditioner or go to places with air conditioning.  Asthma requires careful medical care. See your health care provider for a follow-up as advised. If you are more than [redacted] weeks pregnant and you were prescribed any new medicines, let your obstetrician know about the visit and how you are doing. Follow up with your health care provider as directed.  After you have recovered from your asthma attack, make an appointment with your outpatient doctor to talk about ways to reduce the likelihood of future attacks. If you do not have a doctor who manages your asthma, make an appointment with a primary  care doctor to discuss your asthma. SEEK IMMEDIATE MEDICAL CARE IF:   You are getting worse.  You have trouble breathing. If severe, call your local emergency services (911 in the [place]  You develop chest pain or discomfort.  You are  vomiting.  You are not able to keep fluids down.  You are coughing up yellow, green, brown, or bloody sputum.  You have a fever and your symptoms suddenly get worse.  You have trouble swallowing. MAKE SURE YOU:   Understand these instructions.  Will watch your condition.  Will get help right away if you are not doing well or get worse. Document Released: 01/24/2007 Document Revised: 10/14/2013 Document Reviewed: 04/16/2013 [facility] Patient Information 2015 [place], [place]. This information is not intended to replace advice given to you by your health care provider. Make sure you discuss any questions you have with your health care provider.

## 2015-02-25 NOTE — ED Notes (Signed)
Peak flow completed - Pt at 155

## 2015-02-25 NOTE — ED Notes (Signed)
Tatyana PA at bedside   

## 2015-02-26 ENCOUNTER — Ambulatory Visit: Payer: Self-pay | Admitting: Family Medicine

## 2015-03-01 ENCOUNTER — Encounter: Payer: Self-pay | Admitting: Family Medicine

## 2015-03-01 ENCOUNTER — Ambulatory Visit (INDEPENDENT_AMBULATORY_CARE_PROVIDER_SITE_OTHER): Payer: Self-pay | Admitting: Family Medicine

## 2015-03-01 VITALS — BP 149/89 | HR 85 | Temp 98.5°F | Ht 66.0 in | Wt 177.0 lb

## 2015-03-01 DIAGNOSIS — J45901 Unspecified asthma with (acute) exacerbation: Secondary | ICD-10-CM

## 2015-03-01 MED ORDER — ALBUTEROL SULFATE (2.5 MG/3ML) 0.083% IN NEBU
2.5000 mg | INHALATION_SOLUTION | Freq: Once | RESPIRATORY_TRACT | Status: AC
  Administered 2015-03-01: 2.5 mg via RESPIRATORY_TRACT

## 2015-03-01 MED ORDER — IPRATROPIUM BROMIDE 0.02 % IN SOLN
0.5000 mg | Freq: Once | RESPIRATORY_TRACT | Status: AC
  Administered 2015-03-01: 0.5 mg via RESPIRATORY_TRACT

## 2015-03-01 NOTE — Patient Instructions (Signed)

## 2015-03-01 NOTE — Progress Notes (Signed)
Lydia Griffin is a 54 y.o. female who presents today for asthma exacerbation f/u.   Asthma - Pt with known moderate persistent asthma, recently seen in ED about 4 days ago for worsening SOB.  Started on steroid taper, which she is currently on day 4/7.  Also using albuterol, most recent use was yesterday.  She has been off and on medications (inhalers, controller) for her asthma.  Most recent was given dulera, but hard to pay for even with insurance.  Currently in the middle of switching insurance, so not sure what insurance it will be at this time.  Denies increasing SOB, wheezing, fever, chills, or sweats, CP.    Past Medical History  Diagnosis Date  . Asthma     Started as a child; Has been intubated for asthma exacerbation twice in approx 1990  . Shortness of breath     "related to asthma attacks" (09/19/2012)    History  Smoking status  . Never Smoker   Smokeless tobacco  . Never Used    Family History  Problem Relation Age of Onset  . Asthma Mother   . Heart disease Father [id number]    MI  . Alcohol abuse Father   . Asthma Sister 9    death from asthma    Current Outpatient Prescriptions on File Prior to Visit  Medication Sig Dispense Refill  . albuterol (PROVENTIL HFA;VENTOLIN HFA) 108 (90 BASE) MCG/ACT inhaler Inhale 2 puffs into the lungs every 4 (four) hours as needed for wheezing or shortness of breath (cough). every 4-6 hours for the next 24 1 Inhaler 0  . albuterol (PROVENTIL) (5 MG/ML) 0.5% nebulizer solution Take 2.5 mg by nebulization every 6 (six) hours as needed for wheezing or shortness of breath.    . Cyanocobalamin (VITAMIN B 12 PO) Place 1 tablet under the tongue daily.    [name] Kitchen. loratadine (CLARITIN) 10 MG tablet Take 1 tablet (10 mg total) by mouth daily. (Patient not taking: Reported on 02/24/2015) 30 tablet 0  . mometasone-formoterol (DULERA) 200-5 MCG/ACT AERO Inhale 2 puffs into the lungs 2 (two) times daily. 1 Inhaler 0  . montelukast (SINGULAIR) 10 MG tablet Take  1 tablet (10 mg total) by mouth at bedtime. (Patient taking differently: Take 10 mg by mouth daily. ) 30 tablet 11  . Multiple Vitamin (MULITIVITAMIN WITH MINERALS) TABS Take 1 tablet by mouth daily.    . pantoprazole (PROTONIX) 20 MG tablet Take 1 tablet (20 mg total) by mouth daily. (Patient not taking: Reported on 01/02/2015) 30 tablet 0  . predniSONE (DELTASONE) 10 MG tablet Take 6 tab day 1, take 5 tab day 2 and 3, take 4 tabs day 4, take 3 tab day 5, take 2 tab day 6, take 1 tab day 7 26 tablet 0  . Probiotic Product (PRO-BIOTIC BLEND PO) Take 1 tablet by mouth daily.     No current facility-administered medications on file prior to visit.    ROS: Per HPI.  All other systems reviewed and are negative.   Physical Exam Filed Vitals:   03/01/15 1545  BP: 149/89  Pulse: 85  Temp: 98.5 F (36.9 C)    Physical Examination: General appearance - alert, well appearing, and in no distress Chest - No increased WOB or accessory muscle use.  Good air movement but diffuse inspiratory/expiratory wheezing bilateral lung fields Heart - normal rate and regular rhythm

## 2015-03-01 NOTE — Addendum Note (Signed)
Addended by: Blair PromiseHOMSEN, MEREDITH B on: 03/01/2015 04:26 PM   Modules accepted: Orders

## 2015-03-01 NOTE — Assessment & Plan Note (Signed)
Pt with improving, but ongoing asthma exacerbation.  She may benefit in the future from increased control of allergies - Neb tx in clinic today (improved after) - Continue steroid taper [id number].  Will call back when insurance information better obtained (we will need to discuss with Dr. Raymondo BandKoval about best inhaler option for cheapest price) - Albuterol 2 puffs every 4-6 hrs as needed  - F/U in one week or sooner if worsening/no improvement.

## 2015-06-02 ENCOUNTER — Ambulatory Visit (INDEPENDENT_AMBULATORY_CARE_PROVIDER_SITE_OTHER): Payer: Self-pay | Admitting: Family Medicine

## 2015-06-02 ENCOUNTER — Other Ambulatory Visit (HOSPITAL_COMMUNITY)
Admission: RE | Admit: 2015-06-02 | Discharge: 2015-06-02 | Disposition: A | Discharge status: Home / Self Care | Payer: Self-pay | Source: Ambulatory Visit | Attending: Family Medicine | Admitting: Family Medicine

## 2015-06-02 VITALS — BP 161/79 | HR 82 | Temp 98.3°F | Ht 66.0 in | Wt 166.9 lb

## 2015-06-02 DIAGNOSIS — Z1151 Encounter for screening for human papillomavirus (HPV): Secondary | ICD-10-CM | POA: Insufficient documentation

## 2015-06-02 DIAGNOSIS — Z113 Encounter for screening for infections with a predominantly sexual mode of transmission: Secondary | ICD-10-CM | POA: Insufficient documentation

## 2015-06-02 DIAGNOSIS — Z124 Encounter for screening for malignant neoplasm of cervix: Secondary | ICD-10-CM

## 2015-06-02 DIAGNOSIS — Z202 Contact with and (suspected) exposure to infections with a predominantly sexual mode of transmission: Secondary | ICD-10-CM

## 2015-06-02 DIAGNOSIS — Z01419 Encounter for gynecological examination (general) (routine) without abnormal findings: Secondary | ICD-10-CM | POA: Insufficient documentation

## 2015-06-02 DIAGNOSIS — N760 Acute vaginitis: Secondary | ICD-10-CM

## 2015-06-02 DIAGNOSIS — A599 Trichomoniasis, unspecified: Secondary | ICD-10-CM

## 2015-06-02 DIAGNOSIS — B9689 Other specified bacterial agents as the cause of diseases classified elsewhere: Secondary | ICD-10-CM

## 2015-06-02 DIAGNOSIS — A499 Bacterial infection, unspecified: Secondary | ICD-10-CM

## 2015-06-02 LAB — POCT WET PREP (WET MOUNT): CLUE CELLS WET PREP WHIFF POC: NEGATIVE

## 2015-06-02 MED ORDER — METRONIDAZOLE 500 MG PO TABS: 500.0000 mg | ORAL_TABLET | Freq: Two times a day (BID) | ORAL | Status: AC

## 2015-06-02 NOTE — Patient Instructions (Addendum)
Thank you for coming to see me today. It was a pleasure. Today we talked about:   Vaginal discharge: it appears you have an infection of trichomonas. This is considered a sexually transmitted infection. This could be a recent infection or a chronic infection. You also had signs of bacterial vaginosis which is not considered a sexually transmitted infection. We will have to wait on the results of the HIV, syphilis, gonorrhea and chlamydia. I will prescribe metronidazole 500mg  twice daily for 7 days.  If you have any questions or concerns, please do not hesitate to call the office at [phone].  Sincerely,  Jacquelin Hawking, MD  Metronidazole tablets or capsules What is this medicine? METRONIDAZOLE (me troe NI da zole) is an antiinfective. It is used to treat certain kinds of bacterial and protozoal infections. It will not work for colds, flu, or other viral infections. This medicine may be used for other purposes; ask your health care provider or pharmacist if you have questions. COMMON BRAND NAME(S): Flagyl What should I tell my health care provider before I take this medicine? They need to know if you have any of these conditions: -anemia or other blood disorders -disease of the nervous system -fungal or yeast infection -if you drink alcohol containing drinks -liver disease -seizures -an unusual or allergic reaction to metronidazole, or other medicines, foods, dyes, or preservatives -pregnant or trying to get pregnant -breast-feeding How should I use this medicine? Take this medicine by mouth with a full glass of water. Follow the directions on the prescription label. Take your medicine at regular intervals. Do not take your medicine more often than directed. Take all of your medicine as directed even if you think you are better. Do not skip doses or stop your medicine early. Talk to your pediatrician regarding the use of this medicine in children. Special care may be  needed. Overdosage: If you think you have taken too much of this medicine contact a poison control center or emergency room at once. NOTE: This medicine is only for you. Do not share this medicine with others. What if I miss a dose? If you miss a dose, take it as soon as you can. If it is almost time for your next dose, take only that dose. Do not take double or extra doses. What may interact with this medicine? Do not take this medicine with any of the following medications: -alcohol or any product that contains alcohol -amprenavir oral solution -cisapride -disulfiram -dofetilide -dronedarone -paclitaxel injection -pimozide -ritonavir oral solution -sertraline oral solution -sulfamethoxazole-trimethoprim injection -thioridazine -ziprasidone This medicine may also interact with the following medications: -birth control pills -cimetidine -lithium -other medicines that prolong the QT interval (cause an abnormal heart rhythm) -phenobarbital -phenytoin -warfarin This list may not describe all possible interactions. Give your health care provider a list of all the medicines, herbs, non-prescription drugs, or dietary supplements you use. Also tell them if you smoke, drink alcohol, or use illegal drugs. Some items may interact with your medicine. What should I watch for while using this medicine? Tell your doctor or health care professional if your symptoms do not improve or if they get worse. You may get drowsy or dizzy. Do not drive, use machinery, or do anything that needs mental alertness until you know how this medicine affects you. Do not stand or sit up quickly, especially if you are an older patient. This reduces the risk of dizzy or fainting spells. Avoid alcoholic drinks while you are taking this medicine  and for three days afterward. Alcohol may make you feel dizzy, sick, or flushed. If you are being treated for a sexually transmitted disease, avoid sexual contact until you have  finished your treatment. Your sexual partner may also need treatment. What side effects may I notice from receiving this medicine? Side effects that you should report to your doctor or health care professional as soon as possible: -allergic reactions like skin rash or hives, swelling of the face, lips, or tongue -confusion, clumsiness -difficulty speaking -discolored or sore mouth -dizziness -fever, infection -numbness, tingling, pain or weakness in the hands or feet -trouble passing urine or change in the amount of urine -redness, blistering, peeling or loosening of the skin, including inside the mouth -seizures -unusually weak or tired -vaginal irritation, dryness, or discharge Side effects that usually do not require medical attention (report to your doctor or health care professional if they continue or are bothersome): -diarrhea -headache -irritability -metallic taste -nausea -stomach pain or cramps -trouble sleeping This list may not describe all possible side effects. Call your doctor for medical advice about side effects. You may report side effects to FDA at 1-800-FDA-1088. Where should I keep my medicine? Keep out of the reach of children. Store at room temperature below 25 degrees C (77 degrees F). Protect from light. Keep container tightly closed. Throw away any unused medicine after the expiration date. NOTE: This sheet is a summary. It may not cover all possible information. If you have questions about this medicine, talk to your doctor, pharmacist, or health care provider.  2015, Elsevier/Gold Standard. (2013-05-16 14:08:39)   Trichomoniasis Trichomoniasis is an infection caused by an organism called Trichomonas. The infection can affect both women and men. In women, the outer female genitalia and the vagina are affected. In men, the penis is mainly affected, but the prostate and other reproductive organs can also be involved. Trichomoniasis is a sexually transmitted  infection (STI) and is most often passed to another person through sexual contact.  RISK FACTORS  Having unprotected sexual intercourse.  Having sexual intercourse with an infected partner. SIGNS AND SYMPTOMS  Symptoms of trichomoniasis in women include:  Abnormal gray-green frothy vaginal discharge.  Itching and irritation of the vagina.  Itching and irritation of the area outside the vagina. Symptoms of trichomoniasis in men include:   Penile discharge with or without pain.  Pain during urination. This results from inflammation of the urethra. DIAGNOSIS  Trichomoniasis may be found during a Pap test or physical exam. Your health care provider may use one of the following methods to help diagnose this infection:  Examining vaginal discharge under a microscope. For men, urethral discharge would be examined.  Testing the pH of the vagina with a test tape.  Using a vaginal swab test that checks for the Trichomonas organism. A test is available that provides results within a few minutes.  Doing a culture test for the organism. This is not usually needed. TREATMENT   You may be given medicine to fight the infection. Women should inform their health care provider if they could be or are pregnant. Some medicines used to treat the infection should not be taken during pregnancy.  Your health care provider may recommend over-the-counter medicines or creams to decrease itching or irritation.  Your sexual partner will need to be treated if infected. HOME CARE INSTRUCTIONS   Take medicines only as directed by your health care provider.  Take over-the-counter medicine for itching or irritation as directed by your health care  provider.  Do not have sexual intercourse while you have the infection.  Women should not douche or wear tampons while they have the infection.  Discuss your infection with your partner. Your partner may have gotten the infection from you, or you may have gotten  it from your partner.  Have your sex partner get examined and treated if necessary.  Practice safe, informed, and protected sex.  See your health care provider for other STI testing. SEEK MEDICAL CARE IF:   You still have symptoms after you finish your medicine.  You develop abdominal pain.  You have pain when you urinate.  You have bleeding after sexual intercourse.  You develop a rash.  Your medicine makes you sick or makes you throw up (vomit). MAKE SURE YOU:  Understand these instructions.  Will watch your condition.  Will get help right away if you are not doing well or get worse. Document Released: 04/04/2001 Document Revised: 02/23/2014 Document Reviewed: 07/21/2013 [facility] Patient Information 2015 [place], [place]. This information is not intended to replace advice given to you by your health care provider. Make sure you discuss any questions you have with your health care provider.

## 2015-06-02 NOTE — Progress Notes (Signed)
    Subjective   Lydia Griffin is a 54 y.o. female that presents for a same day visit  1. Vaginal itching: Started about 3 days ago. She started developing clear, copious discharge. Slight vaginal odor. No vaginal bleeding. She is having sex, men only, one partner. No fevers, nausea or vomiting.  ROS Per HPI  Social History  Substance Use Topics  . Smoking status: Never Smoker   . Smokeless tobacco: Never Used  . Alcohol Use: No    No Known Allergies  Objective   BP 161/79 mmHg  Pulse 82  Temp(Src) 98.3 F (36.8 C) (Oral)  Ht  (1.676 m)  Wt 166 lb 14.4 oz (75.705 kg)  BMI 26.95 kg/m2  General: Well appearing, no distress Genitourinary: No external vaginal lesions, greyish-green discharge noticed in the vagina. Cervix visible and friable with no obvious lesions. Wet prep, pap smear and GC/chlamydia swabs obtained.  Assessment and Plan   Meds ordered this encounter  Medications  . metroNIDAZOLE (FLAGYL) 500 MG tablet    Sig: Take 1 tablet (500 mg total) by mouth 2 (two) times daily.    Dispense:  14 tablet    Refill:  0    Trichomoniasis: discussed need for partner to obtain treatment. Patient visibly and understandably distressed with this information. Patient consoled. Questions answered. Bacterial vaginosis  Metronidazole as above  GC/Chlamydia pending  HIV/RPR pending

## 2015-06-03 LAB — CERVICOVAGINAL ANCILLARY ONLY
Chlamydia: NEGATIVE
NEISSERIA GONORRHEA: NEGATIVE

## 2015-06-03 LAB — HIV ANTIBODY (ROUTINE TESTING W REFLEX): HIV 1&2 Ab, 4th Generation: NONREACTIVE

## 2015-06-03 LAB — RPR

## 2015-06-03 NOTE — Addendum Note (Signed)
Addended by: Georges Lynch T on: 06/03/2015 09:56 AM   Modules accepted: Orders

## 2015-06-04 ENCOUNTER — Encounter: Payer: Self-pay | Admitting: Family Medicine

## 2015-06-04 LAB — CYTOLOGY - PAP

## 2015-07-19 ENCOUNTER — Encounter (HOSPITAL_COMMUNITY): Payer: Self-pay | Admitting: Family Medicine

## 2015-07-19 ENCOUNTER — Emergency Department (HOSPITAL_COMMUNITY)
Admission: EM | Admit: 2015-07-19 | Discharge: 2015-07-19 | Disposition: A | Discharge status: Home / Self Care | Payer: Self-pay | Attending: Emergency Medicine | Admitting: Emergency Medicine

## 2015-07-19 DIAGNOSIS — Y9389 Activity, other specified: Secondary | ICD-10-CM | POA: Insufficient documentation

## 2015-07-19 DIAGNOSIS — Y9289 Other specified places as the place of occurrence of the external cause: Secondary | ICD-10-CM | POA: Insufficient documentation

## 2015-07-19 DIAGNOSIS — M791 Myalgia, unspecified site: Secondary | ICD-10-CM

## 2015-07-19 DIAGNOSIS — S4991XA Unspecified injury of right shoulder and upper arm, initial encounter: Secondary | ICD-10-CM | POA: Insufficient documentation

## 2015-07-19 DIAGNOSIS — Y998 Other external cause status: Secondary | ICD-10-CM | POA: Insufficient documentation

## 2015-07-19 DIAGNOSIS — Z79899 Other long term (current) drug therapy: Secondary | ICD-10-CM | POA: Insufficient documentation

## 2015-07-19 DIAGNOSIS — S3992XA Unspecified injury of lower back, initial encounter: Secondary | ICD-10-CM | POA: Insufficient documentation

## 2015-07-19 DIAGNOSIS — W19XXXA Unspecified fall, initial encounter: Secondary | ICD-10-CM

## 2015-07-19 DIAGNOSIS — W010XXA Fall on same level from slipping, tripping and stumbling without subsequent striking against object, initial encounter: Secondary | ICD-10-CM | POA: Insufficient documentation

## 2015-07-19 DIAGNOSIS — J45909 Unspecified asthma, uncomplicated: Secondary | ICD-10-CM | POA: Insufficient documentation

## 2015-07-19 DIAGNOSIS — S4992XA Unspecified injury of left shoulder and upper arm, initial encounter: Secondary | ICD-10-CM | POA: Insufficient documentation

## 2015-07-19 DIAGNOSIS — Z7951 Long term (current) use of inhaled steroids: Secondary | ICD-10-CM | POA: Insufficient documentation

## 2015-07-19 MED ORDER — IBUPROFEN 600 MG PO TABS: 600.0000 mg | ORAL_TABLET | Freq: Four times a day (QID) | ORAL | Status: DC | PRN

## 2015-07-19 MED ORDER — CYCLOBENZAPRINE HCL 10 MG PO TABS: 10.0000 mg | ORAL_TABLET | Freq: Three times a day (TID) | ORAL | Status: DC | PRN

## 2015-07-19 NOTE — ED Notes (Signed)
Pt here for shoulder pain, back pain and neck pain after fall yesterday. sts she slipped in some water and fell on her hands.

## 2015-07-19 NOTE — ED Provider Notes (Signed)
CSN: [id number]     Arrival date & time 07/19/15  0708 History   First MD Initiated Contact with Patient 07/19/15 [phone]     Chief Complaint  Patient presents with  . Shoulder Pain  . Back Pain     (Consider location/radiation/quality/duration/timing/severity/associated sxs/prior Treatment) HPI   Pt reports she slipped on water yesterday and fell backwards, fell onto her arms, more on left than right.  She did not hit her head or lose consciousness. States she initially did not have much pain but slowly increased in pain, feels like soreness and tightness.  Denies weakness or numbness of the extremities.  Denies headache, CP, SOB, abdominal pain.  Pain is worse with movement.    Past Medical History  Diagnosis Date  . Asthma     Started as a child; Has been intubated for asthma exacerbation twice in approx 1990  . Shortness of breath     "related to asthma attacks" (09/19/2012)   Past Surgical History  Procedure Laterality Date  . Hand      flexor tendon repair  . Tubal ligation  ~ 1985   Family History  Problem Relation Age of Onset  . Asthma Mother   . Heart disease Father 40    MI  . Alcohol abuse Father   . Asthma Sister 9    death from asthma   Social History  Substance Use Topics  . Smoking status: Never Smoker   . Smokeless tobacco: Never Used  . Alcohol Use: No   OB History    No data available     Review of Systems  Constitutional: Negative for activity change and fatigue.  Respiratory: Negative for shortness of breath.   Cardiovascular: Negative for chest pain.  Gastrointestinal: Negative for abdominal pain.  Musculoskeletal: Positive for myalgias and back pain. Negative for neck stiffness.  Skin: Negative for color change and wound.  Allergic/Immunologic: Negative for immunocompromised state.  Neurological: Negative for syncope, weakness, numbness and headaches.  Hematological: Does not bruise/bleed easily.  Psychiatric/Behavioral: Negative for  self-injury.      Allergies  Review of patient's allergies indicates no known allergies.  Home Medications   Prior to Admission medications   Medication Sig Start Date End Date Taking? Authorizing Provider  albuterol (PROVENTIL HFA;VENTOLIN HFA) 108 (90 BASE) MCG/ACT inhaler Inhale 2 puffs into the lungs every 4 (four) hours as needed for wheezing or shortness of breath (cough). every 4-6 hours for the next 24 02/16/14   Charlane Ferretti, MD  albuterol (PROVENTIL) (5 MG/ML) 0.5% nebulizer solution Take 2.5 mg by nebulization every 6 (six) hours as needed for wheezing or shortness of breath.    Historical Provider, MD  Cyanocobalamin (VITAMIN B 12 PO) Place 1 tablet under the tongue daily.    Historical Provider, MD  loratadine (CLARITIN) 10 MG tablet Take 1 tablet (10 mg total) by mouth daily. Patient not taking: Reported on 02/24/2015 02/16/14   Charlane Ferretti, MD  mometasone-formoterol [facility]) 200-5 MCG/ACT AERO Inhale 2 puffs into the lungs 2 (two) times daily. 02/18/15   Stephanie Coup Street, MD  montelukast (SINGULAIR) 10 MG tablet Take 1 tablet (10 mg total) by mouth at bedtime. Patient taking differently: Take 10 mg by mouth daily.  09/16/12   Macy Mis, MD  Multiple Vitamin (MULITIVITAMIN WITH MINERALS) TABS Take 1 tablet by mouth daily.    Historical Provider, MD  pantoprazole (PROTONIX) 20 MG tablet Take 1 tablet (20 mg total) by mouth daily. Patient not  taking: Reported on 01/02/2015 02/18/14   Charlane Ferretti, MD  predniSONE (DELTASONE) 10 MG tablet Take 6 tab day 1, take 5 tab day 2 and 3, take 4 tabs day 4, take 3 tab day 5, take 2 tab day 6, take 1 tab day 7 02/25/15   Tatyana Kirichenko, PA-C  Probiotic Product (PRO-BIOTIC BLEND PO) Take 1 tablet by mouth daily.    Historical Provider, MD   BP 133/73 mmHg  Pulse 74  Temp(Src) 98.7 F (37.1 C)  Resp 18  SpO2 95% Physical Exam  Constitutional: She appears well-developed and well-nourished. No distress.  HENT:  Head:  Normocephalic and atraumatic.  Neck: Neck supple.  Cardiovascular: Intact distal pulses.   Pulmonary/Chest: Effort normal.  Musculoskeletal:  Tenderness throughout trapezius, L>R.  Anterior shoulder tender including left superior pectoralis muscle.  No bony tenderness.    Upper extremities:  Strength 5/5, sensation intact, distal pulses intact.    Neurological: She is alert.  Skin: She is not diaphoretic.  Nursing note and vitals reviewed.   ED Course  Procedures (including critical care time) Labs Review Labs Reviewed - No data to display  Imaging Review No results found. I have personally reviewed and evaluated these images and lab results as part of my medical decision-making.   EKG Interpretation None      MDM   Final diagnoses:  Muscle soreness  Fall, initial encounter    Afebrile, nontoxic patient with muscle soreness after mechanical fall yesterday.  No bony tenderness. Neurovascularly intact.   D/C home with flexeril, ibuprofen, PCP follow up.  Discussed result, findings, treatment, and follow up  with patient.  Pt given return precautions.  Pt verbalizes understanding and agrees with plan.        Trixie Dredge, PA-C 07/19/15 1712  Elwin Mocha, MD 07/20/15 [phone]

## 2015-07-19 NOTE — Discharge Instructions (Signed)
Read the information below.  Use the prescribed medication as directed.  Please discuss all new medications with your pharmacist.  You may return to the Emergency Department at any time for worsening condition or any new symptoms that concern you.   If you develop uncontrolled pain, weakness or numbness of the extremity, severe discoloration of the skin, or you are unable to walk, return to the ER for a recheck.    ° ° °Musculoskeletal Pain °Musculoskeletal pain is muscle and boney aches and pains. These pains can occur in any part of the body. Your caregiver may treat you without knowing the cause of the pain. They may treat you if blood or urine tests, X-rays, and other tests were normal.  °CAUSES °There is often not a definite cause or reason for these pains. These pains may be caused by a type of germ (virus). The discomfort may also come from overuse. Overuse includes working out too hard when your body is not fit. Boney aches also come from weather changes. Bone is sensitive to atmospheric pressure changes. °HOME CARE INSTRUCTIONS  °· Ask when your test results will be ready. Make sure you get your test results. °· Only take over-the-counter or prescription medicines for pain, discomfort, or fever as directed by your caregiver. If you were given medications for your condition, do not drive, operate machinery or power tools, or sign legal documents for 24 hours. Do not drink alcohol. Do not take sleeping pills or other medications that may interfere with treatment. °· Continue all activities unless the activities cause more pain. When the pain lessens, slowly resume normal activities. Gradually increase the intensity and duration of the activities or exercise. °· During periods of severe pain, bed rest may be helpful. Lay or sit in any position that is comfortable. °· Putting ice on the injured area. °¨ Put ice in a bag. °¨ Place a towel between your skin and the bag. °¨ Leave the ice on for 15 to 20 minutes, 3  to 4 times a day. °· Follow up with your caregiver for continued problems and no reason can be found for the pain. If the pain becomes worse or does not go away, it may be necessary to repeat tests or do additional testing. Your caregiver may need to look further for a possible cause. °SEEK IMMEDIATE MEDICAL CARE IF: °· You have pain that is getting worse and is not relieved by medications. °· You develop chest pain that is associated with shortness or breath, sweating, feeling sick to your stomach (nauseous), or throw up (vomit). °· Your pain becomes localized to the abdomen. °· You develop any new symptoms that seem different or that concern you. °MAKE SURE YOU:  °· Understand these instructions. °· Will watch your condition. °· Will get help right away if you are not doing well or get worse. °Document Released: 10/09/2005 Document Revised: 01/01/2012 Document Reviewed: 06/13/2013 °[employer]® Patient Information ©2015 [employer]. This information is not intended to replace advice given to you by your health care provider. Make sure you discuss any questions you have with your health care provider. ° °

## 2015-07-19 NOTE — ED Notes (Signed)
Declined W/C at D/C and was escorted to lobby by RN. 

## 2015-07-26 ENCOUNTER — Other Ambulatory Visit: Payer: Self-pay | Admitting: Occupational Medicine

## 2015-07-26 ENCOUNTER — Ambulatory Visit: Payer: Self-pay

## 2015-07-26 DIAGNOSIS — M25512 Pain in left shoulder: Secondary | ICD-10-CM

## 2015-07-26 DIAGNOSIS — M545 Low back pain: Secondary | ICD-10-CM

## 2015-07-26 DIAGNOSIS — M25511 Pain in right shoulder: Secondary | ICD-10-CM

## 2015-08-11 ENCOUNTER — Telehealth: Payer: Self-pay | Admitting: Student

## 2015-08-11 NOTE — Telephone Encounter (Signed)
Pt is calling because she has asthma and been having trouble with her breathing she doing treatments but thinks because of the weather change she might need a round of Prednisone called in. Please let patient know if we can call this in or if she needs to be seen. jw

## 2015-08-11 NOTE — Telephone Encounter (Signed)
Will forward to MD but patient will likely need an appt. Lydia Griffin,CMA

## 2015-08-14 NOTE — Telephone Encounter (Signed)
  Pt called regarding breathing symptoms. She states that she continues to take her home advair but she has had to use her albuterol neb more than usual and it helps her breathing but given the increase in frequency, she is concerned that she needs steroids. She thinks her breathing change is due to the change in season. She feels her breathing is stable, denies SOB, CP increased WOB but states she has to " pace herself more" ED precautions given  Langley Ingalls A. Kennon RoundsHaney MD, MS Family Medicine Resident PGY-2 Pager [phone]

## 2015-09-30 ENCOUNTER — Ambulatory Visit (INDEPENDENT_AMBULATORY_CARE_PROVIDER_SITE_OTHER): Payer: Self-pay | Admitting: Family Medicine

## 2015-09-30 ENCOUNTER — Encounter: Payer: Self-pay | Admitting: Family Medicine

## 2015-09-30 ENCOUNTER — Other Ambulatory Visit (HOSPITAL_COMMUNITY)
Admission: RE | Admit: 2015-09-30 | Discharge: 2015-09-30 | Disposition: A | Discharge status: Home / Self Care | Payer: Self-pay | Source: Ambulatory Visit | Attending: Family Medicine | Admitting: Family Medicine

## 2015-09-30 VITALS — BP 114/64 | HR 71 | Wt 177.0 lb

## 2015-09-30 DIAGNOSIS — J4540 Moderate persistent asthma, uncomplicated: Secondary | ICD-10-CM

## 2015-09-30 DIAGNOSIS — Z113 Encounter for screening for infections with a predominantly sexual mode of transmission: Secondary | ICD-10-CM | POA: Insufficient documentation

## 2015-09-30 DIAGNOSIS — Z23 Encounter for immunization: Secondary | ICD-10-CM

## 2015-09-30 DIAGNOSIS — N898 Other specified noninflammatory disorders of vagina: Secondary | ICD-10-CM

## 2015-09-30 DIAGNOSIS — A599 Trichomoniasis, unspecified: Secondary | ICD-10-CM

## 2015-09-30 LAB — POCT WET PREP (WET MOUNT): Clue Cells Wet Prep Whiff POC: NEGATIVE

## 2015-09-30 MED ORDER — METRONIDAZOLE 500 MG PO TABS: ORAL_TABLET | ORAL | Status: DC

## 2015-09-30 MED ORDER — BECLOMETHASONE DIPROPIONATE 80 MCG/ACT IN AERS: 1.0000 | INHALATION_SPRAY | Freq: Two times a day (BID) | RESPIRATORY_TRACT | Status: DC

## 2015-09-30 NOTE — Progress Notes (Signed)
Patient ID: Lydia Griffin [place], female   DOB: 12-02-1960, 54 y.Griffin.   MRN: [id number]   Subjective:   Lydia Griffin Abdou is a 54 y.Griffin. female with a history of asthma presenting with  Chief Complaint  Patient presents with  . Asthma  . Vaginal Discharge   #Asthma: currently taking only her short acting albuterol and zyrtec. Has not taken dulera in 2 weeks due to cost.  Using albuterol 2-3 times per day. Every morning and at least once per night. She reports a night time cough, especially in the last 2 weeks  #Vaginal discharge: sexually active with female partner who patient feels is not monogamus. She reports the discharge started 2-3 days ago. Reports odor, "retched smell" Reports no abdominal . No dysuria. She reports getting trichamonas several months ago (Aug 2016). She has itching and discharge at that time.   Health Maintenance Due  Topic Date Due  . Hepatitis C Screening  002-06-1961  . MAMMOGRAM  05/10/2010  . COLONOSCOPY  05/10/2010  . TETANUS/TDAP  03/24/2015  . INFLUENZA VACCINE  05/24/2015   Review of Systems:   Per HPI. All other systems reviewed and are negative expect as in HPI   PMH, PSH, Medications, Allergies, SocialHx and FHx reviewed and updated in EMR- marked as reviewed 09/30/2015   Objective:  BP 114/64 mmHg  Pulse 71  Wt 177 lb (80.287 kg)  SpO2 99%  Gen:  54 y.Griffin. female in NAD. Speaking in full sentences. Good eye contact HEENT: NCAT, MMM, EOMI, PERRL, anicteric sclerae.  CV: RRR, no MRG, no JVD Resp: Normal WOB. Non-labored, CTAB, no wheezes noted GI: Soft, NTND, BS present, no guarding or organomegaly Ext: WWP, no edema MSK: Intact gait. Full ROM Neuro: Alert and oriented, speech normal Pysch: Normal mood and affect.     Assessment:     Lydia Griffin Wisnieski is a 54 y.Griffin. female here for asthma follow up and vaginal discharge    Plan:   #Asthma - Start inhaled corticosteroid alone (Qvar 80 1 puff BID) - Stop dulera - next step if able to afford qvar alone and  not well controlled sx, add salmeterol inhaler.  - provided patient with info about MAP program  #Vaginal Discharge -wet prep positive for Trichamonas -Rx for Metronidazole sent to pharmacy -provided expedited partner treatment rx.  Orders Placed This Encounter  Procedures  . POCT Wet Prep Mellody Drown(Wet [place])   #HCM: -flu shot today  Federico FlakeKimberly Niles Kharter Sestak, MD, [facility] OB Fellow, [facility] 09/30/2015  2:23 PM

## 2015-09-30 NOTE — Patient Instructions (Addendum)
Stop Dulera Start taking Qvar- 1 puff twice daily  Take your metronidazole 4 pills x 1 I have given you another prescription for your partner   Expedited Partner Therapy:  Information Sheet for Patients and Partners               You have been offered expedited partner therapy (EPT). This information sheet contains important information and warnings you need to be aware of, so please read it carefully.   Expedited Partner Therapy (EPT) is the clinical practice of treating the sexual partners of persons who receive chlamydia, gonorrhea, or trichomoniasis diagnoses by providing medications or prescriptions to the patient. Patients then provide partners with these therapies without the health-care provider having examined the partner. In other words, EPT is a convenient, fast and private way for patients to help their sexual partners get treated.   Chlamydia and gonorrhea are bacterial infections you get from having sex with a person who is already infected. Trichomoniasis (or "trich") is a very common sexually transmitted infection (STI) that is caused by infection with a protozoan parasite called Trichomonas vaginalis.  Many people with these infections don't know it because they feel fine, but without treatment these infections can cause serious health problems, such as pelvic inflammatory disease, ectopic pregnancy, infertility and increased risk of HIV.   It is important to get treated as soon as possible to protect your health, to avoid spreading these infections to others, and to prevent yourself from becoming re-infected. The good news is these infections can be easily cured with proper antibiotic medicine. The best way to take care of your self is to see a doctor or go to your local health department. If you are not able to see a doctor or other medical provider, you should take EPT.    Recommended Medication: EPT for Chlamydia:  Azithromycin (Zithromax) 1 gram orally in a single dose EPT  for Gonorrhea:  Cefixime (Suprax) 400 milligrams orally in a single dose PLUS azithromycin (Zithromax) 1 gram orally in a single dose EPT for Trichomoniasis:  Metronidazole (Flagyl) 2 grams orally in a single dose   These medicines are very safe. However, you should not take them if you have ever had an allergic reaction (like a rash) to any of these medicines: azithromycin (Zithromax), erythromycin, clarithromycin (Biaxin), metronidazole (Flagyl), tinidazole (Tindimax). If you are uncertain about whether you have an allergy, call your medical provider or pharmacist before taking this medicine. If you have a serious, long-term illness like kidney, liver or heart disease, colitis or stomach problems, or you are currently taking other prescription medication, talk to your provider before taking this medication.   Women: If you have lower belly pain, pain during sex, vomiting, or a fever, do not take this medicine. Instead, you should see a medical provider to be certain you do not have pelvic inflammatory disease (PID). PID can be serious and lead to infertility, pregnancy problems or chronic pelvic pain.   Pregnant Women: It is very important for you to see a doctor to get pregnancy services and pre-natal care. These antibiotics for EPT are safe for pregnant women, but you still need to see a medical provider as soon as possible. It is also important to note that Doxycycline is an alternative therapy for chlamydia, but it should not be taken by someone who is pregnant.   Men: If you have pain or swelling in the testicles or a fever, do not take this medicine and see a medical provider.  Men who have sex with men (MSM): MSM in [place] continue to experience high rates of syphilis and HIV. Many MSM with gonorrhea or chlamydia could also have syphilis and/or HIV and not know it. If you are a man who has sex with other men, it is very important that you see a medical provider and are tested for HIV  and syphilis. EPT is not recommended for gonorrhea for MSM.  Recommended treatment for gonorrhea for MSM is Rocephin (shot) AND azithromycin due to decreased cure rate.  Please see your medical provider if this is the case.    Along with this information sheet is a prescription for the medicine. If you receive a prescription it will be in your name and will indicate your date of birth, or it will be in the name of "Expedited Partner Therapy".   In either case, you can have the prescription filled at a pharmacy. You will be responsible for the cost of the medicine, unless you have prescription drug coverage. In that case, you could provide your name so the pharmacy could bill your health plan.   Take the medication as directed. Some people will have a mild, upset stomach, which does not last long. AVOID alcohol 24 hours after taking metronidazole (Flagyl) to reduce the possibility of a disulfiram-like reaction (severe vomiting and abdominal pain).  After taking the medicine, do not have sex for 7 days. Do not share this medicine or give it to anyone else. It is important to tell everyone you have had sex with in the last 60 days that they need to go and get tested for sexually transmitted infections.   Ways to prevent these and other sexually transmitted infections (STIs):   [name] Kitchen Abstain from sex. This is the only sure way to avoid getting an STI.  [name] Kitchen Use barrier methods, such as condoms, consistently and correctly.  . Limit the number of sexual partners.  . Have regular physical exams, including testing for STIs.   For more information about EPT or other issues pertaining to an STI, please contact your medical provider or the [facility] Department at [phone] or [email or web address]     Asthma, Adult Asthma is a recurring condition in which the airways tighten and narrow. Asthma can make it difficult to breathe. It can  cause coughing, wheezing, and shortness of breath. Asthma episodes, also called asthma attacks, range from minor to life-threatening. Asthma cannot be cured, but medicines and lifestyle changes can help control it. CAUSES Asthma is believed to be caused by inherited (genetic) and environmental factors, but its exact cause is unknown. Asthma may be triggered by allergens, lung infections, or irritants in the air. Asthma triggers are different for each person. Common triggers include:   Animal dander.  Dust mites.  Cockroaches.  Pollen from trees or grass.  Mold.  Smoke.  Air pollutants such as dust, household cleaners, hair sprays, aerosol sprays, paint fumes, strong chemicals, or strong odors.  Cold air, weather changes, and winds (which increase molds and pollens in the air).  Strong emotional expressions such as crying or laughing hard.  Stress.  Certain medicines (such as aspirin) or types of drugs (such as beta-blockers).  Sulfites in foods and drinks. Foods and drinks that may contain sulfites include dried fruit, potato chips, and sparkling grape juice.  Infections or inflammatory conditions such as the flu, a cold, or an inflammation of the nasal membranes (rhinitis).  Gastroesophageal reflux disease (GERD).  Exercise or strenuous  activity. SYMPTOMS Symptoms may occur immediately after asthma is triggered or many hours later. Symptoms include:  Wheezing.  Excessive nighttime or early morning coughing.  Frequent or severe coughing with a common cold.  Chest tightness.  Shortness of breath. DIAGNOSIS  The diagnosis of asthma is made by a review of your medical history and a physical exam. Tests may also be performed. These may include:  Lung function studies. These tests show how much air you breathe in and out.  Allergy tests.  Imaging tests such as X-rays. TREATMENT  Asthma cannot be cured, but it can usually be controlled. Treatment involves identifying  and avoiding your asthma triggers. It also involves medicines. There are 2 classes of medicine used for asthma treatment:   Controller medicines. These prevent asthma symptoms from occurring. They are usually taken every day.  Reliever or rescue medicines. These quickly relieve asthma symptoms. They are used as needed and provide short-term relief. Your health care provider will help you create an asthma action plan. An asthma action plan is a written plan for managing and treating your asthma attacks. It includes a list of your asthma triggers and how they may be avoided. It also includes information on when medicines should be taken and when their dosage should be changed. An action plan may also involve the use of a device called a peak flow meter. A peak flow meter measures how well the lungs are working. It helps you monitor your condition. HOME CARE INSTRUCTIONS   Take medicines only as directed by your health care provider. Speak with your health care provider if you have questions about how or when to take the medicines.  Use a peak flow meter as directed by your health care provider. Record and keep track of readings.  Understand and use the action plan to help minimize or stop an asthma attack without needing to seek medical care.  Control your home environment in the following ways to help prevent asthma attacks:  Do not smoke. Avoid being exposed to secondhand smoke.  Change your heating and air conditioning filter regularly.  Limit your use of fireplaces and wood stoves.  Get rid of pests (such as roaches and mice) and their droppings.  Throw away plants if you see mold on them.  Clean your floors and dust regularly. Use unscented cleaning products.  Try to have someone else vacuum for you regularly. Stay out of rooms while they are being vacuumed and for a short while afterward. If you vacuum, use a dust mask from a hardware store, a double-layered or microfilter vacuum  cleaner bag, or a vacuum cleaner with a HEPA filter.  Replace carpet with wood, tile, or vinyl flooring. Carpet can trap dander and dust.  Use allergy-proof pillows, mattress covers, and box spring covers.  Wash bed sheets and blankets every week in hot water and dry them in a dryer.  Use blankets that are made of polyester or cotton.  Clean bathrooms and kitchens with bleach. If possible, have someone repaint the walls in these rooms with mold-resistant paint. Keep out of the rooms that are being cleaned and painted.  Wash hands frequently. SEEK MEDICAL CARE IF:   You have wheezing, shortness of breath, or a cough even if taking medicine to prevent attacks.  The colored mucus you cough up (sputum) is thicker than usual.  Your sputum changes from clear or white to yellow, green, gray, or bloody.  You have any problems that may be related to the  medicines you are taking (such as a rash, itching, swelling, or trouble breathing).  You are using a reliever medicine more than 2-3 times per week.  Your peak flow is still at 50-79% of your personal best after following your action plan for 1 hour.  You have a fever. SEEK IMMEDIATE MEDICAL CARE IF:   You seem to be getting worse and are unresponsive to treatment during an asthma attack.  You are short of breath even at rest.  You get short of breath when doing very little physical activity.  You have difficulty eating, drinking, or talking due to asthma symptoms.  You develop chest pain.  You develop a fast heartbeat.  You have a bluish color to your lips or fingernails.  You are light-headed, dizzy, or faint.  Your peak flow is less than 50% of your personal best.   This information is not intended to replace advice given to you by your health care provider. Make sure you discuss any questions you have with your health care provider.   Document Released: 10/09/2005 Document Revised: 06/30/2015 Document Reviewed:  05/08/2013 Elsevier Interactive Patient Education [employer]

## 2015-10-01 LAB — CERVICOVAGINAL ANCILLARY ONLY
CHLAMYDIA, DNA PROBE: NEGATIVE
NEISSERIA GONORRHEA: NEGATIVE

## 2015-10-04 ENCOUNTER — Telehealth: Payer: Self-pay | Admitting: *Deleted

## 2015-10-04 NOTE — Telephone Encounter (Signed)
LM for patient to call back. Jazmin Hartsell,CMA  

## 2015-10-04 NOTE — Telephone Encounter (Signed)
-----   Message from Federico FlakeKimberly Niles [place], MD sent at 10/02/2015  8:23 PM EST ----- Please call patient and let her know Gonorrhea and Chlamydia testing were negative. She does not have either of these infection.  Thanks, Dr. Alvester MorinNewton

## 2015-10-05 NOTE — Telephone Encounter (Signed)
Patient is aware of results and requested a copy be mailed to her.  This has been done.  Jazmin Hartsell,CMA

## 2015-12-08 ENCOUNTER — Ambulatory Visit (INDEPENDENT_AMBULATORY_CARE_PROVIDER_SITE_OTHER): Payer: Self-pay | Admitting: Family Medicine

## 2015-12-08 VITALS — BP 131/81 | HR 81 | Temp 98.3°F | Ht 66.0 in | Wt 193.7 lb

## 2015-12-08 DIAGNOSIS — H65191 Other acute nonsuppurative otitis media, right ear: Secondary | ICD-10-CM | POA: Insufficient documentation

## 2015-12-08 DIAGNOSIS — H6981 Other specified disorders of Eustachian tube, right ear: Secondary | ICD-10-CM

## 2015-12-08 MED ORDER — FLUTICASONE PROPIONATE 50 MCG/ACT NA SUSP: 2.0000 | Freq: Every day | NASAL | Status: DC

## 2015-12-08 NOTE — Patient Instructions (Signed)
Thank you for coming in to clinic today.  1. You have a Right Ear Effusion (fluid) no infection. I do not think this is a sinus infection. Most likely have Eustachian Tube Dysfunction, with improper drainage of fluid and swelling in sinuses, likely from allergies. - Start Flonase nose spray 2 sprays in each nostril daily for next 4 weeks, you may use a saline nose spray several hours later to help flush your nasal passages - Start taking Claritin  daily,  Also keep up Singulair nightly - May try warm compresses on sinuses and effleurage as demonstrated  If you develop significant worsening Headache, Numbness / Weakness in arm or leg or face, confusion, important to be evaluated immediately for concern of a stroke.  If you deveop fevers/chills or thick sinus drainage, this may have progressed to an infection, return for re evaluation  Please schedule a follow-up appointment with Dr Kennon Rounds 2 to 4 weeks for follow-up Right Ear Effusion if not improved  If you have any other questions or concerns, please feel free to call the clinic to contact me. You may also schedule an earlier appointment if necessary.  However, if your symptoms get significantly worse, please go to the Emergency Department to seek immediate medical attention.  Saralyn Pilar, DO [facility]   Epley Maneuver Self-Care WHAT IS THE EPLEY MANEUVER? The Epley maneuver is an exercise you can do to relieve symptoms of benign paroxysmal positional vertigo (BPPV). This condition is often just referred to as vertigo. BPPV is caused by the movement of tiny crystals (canaliths) inside your inner ear. The accumulation and movement of canaliths in your inner ear causes a sudden spinning sensation (vertigo) when you move your head to certain positions. Vertigo usually lasts about 30 seconds. BPPV usually occurs in just one ear. If you get vertigo when you lie on your left side, you probably have BPPV in your left  ear. Your health care provider can tell you which ear is involved.  BPPV may be caused by a head injury. Many people older than 50 get BPPV for unknown reasons. If you have been diagnosed with BPPV, your health care provider may teach you how to do this maneuver. BPPV is not life threatening (benign) and usually goes away in time.  WHEN SHOULD I PERFORM THE EPLEY MANEUVER? You can do this maneuver at home whenever you have symptoms of vertigo. You may do the Epley maneuver up to 3 times a day until your symptoms of vertigo go away. HOW SHOULD I DO THE EPLEY MANEUVER? 1. Sit on the edge of a bed or table with your back straight. Your legs should be extended or hanging over the edge of the bed or table.  2. Turn your head halfway toward the affected ear.  3. Lie backward quickly with your head turned until you are lying flat on your back. You may want to position a pillow under your shoulders.  4. Hold this position for 30 seconds. You may experience an attack of vertigo. This is normal. Hold this position until the vertigo stops. 5. Then turn your head to the opposite direction until your unaffected ear is facing the floor.  6. Hold this position for 30 seconds. You may experience an attack of vertigo. This is normal. Hold this position until the vertigo stops. 7. Now turn your whole body to the same side as your head. Hold for another 30 seconds.  8. You can then sit back up. ARE THERE  RISKS TO THIS MANEUVER? In some cases, you may have other symptoms (such as changes in your vision, weakness, or numbness). If you have these symptoms, stop doing the maneuver and call your health care provider. Even if doing these maneuvers relieves your vertigo, you may still have dizziness. Dizziness is the sensation of light-headedness but without the sensation of movement. Even though the Epley maneuver may relieve your vertigo, it is possible that your symptoms will return within 5 years. WHAT SHOULD I DO  AFTER THIS MANEUVER? After doing the Epley maneuver, you can return to your normal activities. Ask your doctor if there is anything you should do at home to prevent vertigo. This may include:  Sleeping with two or more pillows to keep your head elevated.  Not sleeping on the side of your affected ear.  Getting up slowly from bed.  Avoiding sudden movements during the day.  Avoiding extreme head movement, like looking up or bending over.  Wearing a cervical collar to prevent sudden head movements. WHAT SHOULD I DO IF MY SYMPTOMS GET WORSE? Call your health care provider if your vertigo gets worse. Call your provider right way if you have other symptoms, including:   Nausea.  Vomiting.  Headache.  Weakness.  Numbness.  Vision changes.   This information is not intended to replace advice given to you by your health care provider. Make sure you discuss any questions you have with your health care provider.   Document Released: 10/14/2013 Document Reviewed: 10/14/2013 Elsevier Interactive Patient Education [employer]

## 2015-12-08 NOTE — Progress Notes (Signed)
Subjective:    Patient ID: Lydia Griffin, female    DOB: Feb 27, 1961, 55 y.o.   MRN: [id number]  Lydia Griffin is a 55 y.o. female presenting on 12/08/2015 for Tinnitus  Patient presents for a same day appointment.  HPI  LEFT EAR PULSATING - Reports symptoms started 2 weeks, acute onset overnight stated it woke her up, described Right ear pressure and fullness constant only relief x few min occasionally, without ear ringing, and mild frontal dull headache intermittent - Tried organic coconut oil in ear nightly for 1 week initially - Difficulty sleeping due to pressure and noise - Also Left upper tooth, cracked after biting, denies any pus, scheduled dentist apt has not been yet - Admits x 1 episode of vertigo with some dizziness - History of Asthma and Allergies - Denies any fevers/chills, nasal and sinus congestion, rhinorrhea, cough, nausea  Social History  Substance Use Topics  . Smoking status: Never Smoker   . Smokeless tobacco: Never Used  . Alcohol Use: No    Review of Systems Per HPI unless specifically indicated above     Objective:    BP 131/81 mmHg  Pulse 81  Temp(Src) 98.3 F (36.8 C) (Oral)  Ht  (1.676 m)  Wt 193 lb 11.2 oz (87.862 kg)  BMI 31.28 kg/m2  Wt Readings from Last 3 Encounters:  12/08/15 193 lb 11.2 oz (87.862 kg)  09/30/15 177 lb (80.287 kg)  06/02/15 166 lb 14.4 oz (75.705 kg)     Physical Exam  Constitutional: She appears well-developed and well-nourished. No distress.  Well-appearing, comfortable  HENT:  Head: Normocephalic and atraumatic.  Mouth/Throat: Oropharynx is clear and moist.  Right TM moderate clear effusion. Bilateral TMs clear and grey without erythema. Frontal and maxillary sinuses mildly tender. Nares with edematous turbinates.  Eyes: Conjunctivae and EOM are normal. Pupils are equal, round, and reactive to light.  Neck: Normal range of motion. Neck supple. No thyromegaly present.  Cardiovascular: Normal rate,  regular rhythm, normal heart sounds and intact distal pulses.   No murmur heard. Pulmonary/Chest: Effort normal and breath sounds normal. No respiratory distress. She has no wheezes. She has no rales.  Musculoskeletal: She exhibits no edema.  Lymphadenopathy:    She has no cervical adenopathy.  Neurological: She is alert. No cranial nerve deficit. Coordination normal.  Intact distal muscle str 5/5 grip and ankles. Intact sensation to light touch. Dix-Hallpike maneuver negative for nystagmus.  Skin: Skin is warm and dry. She is not diaphoretic.  Nursing note and vitals reviewed.       Assessment & Plan:   Problem List Items Addressed This Visit    Acute effusion of right ear - Primary    Suspected effusion with eustachian tube dysfunction and chronic allergic rhinitis and seasonal allergies as etiology. Considered inner ear dysfunction with ?vertigo episode, dix hall pike negative  Plan: 1. Flonase 2 sprays daily >4 weeks 2. Start Claritin  OTC 3. Supportive measures, warm compresses, nasal saline 4. Hand out on Epley maneuver to try 5. Follow-up if not improving 2-4 weeks      Relevant Medications   fluticasone (FLONASE) 50 MCG/ACT nasal spray    Other Visit Diagnoses    Eustachian tube dysfunction, right        Relevant Medications    fluticasone (FLONASE) 50 MCG/ACT nasal spray       Meds ordered this encounter  Medications  . fluticasone (FLONASE) 50 MCG/ACT nasal spray    Sig: Place  2 sprays into both nostrils daily.    Dispense:  16 g    Refill:  2      Follow up plan: Return in about 2 weeks (around 12/22/2015), or if symptoms worsen or fail to improve, for Right ear effusion.  Saralyn Pilar, DO [facility], PGY-3

## 2015-12-09 ENCOUNTER — Encounter: Payer: Self-pay | Admitting: Family Medicine

## 2015-12-09 NOTE — Assessment & Plan Note (Signed)
Suspected effusion with eustachian tube dysfunction and chronic allergic rhinitis and seasonal allergies as etiology. Considered inner ear dysfunction with ?vertigo episode, dix hall pike negative  Plan: 1. Flonase 2 sprays daily >4 weeks 2. Start Claritin  OTC 3. Supportive measures, warm compresses, nasal saline 4. Hand out on Epley maneuver to try 5. Follow-up if not improving 2-4 weeks

## 2016-01-03 ENCOUNTER — Ambulatory Visit: Payer: Self-pay | Admitting: Family Medicine

## 2016-01-04 ENCOUNTER — Other Ambulatory Visit (HOSPITAL_COMMUNITY)
Admission: RE | Admit: 2016-01-04 | Discharge: 2016-01-04 | Disposition: A | Discharge status: Home / Self Care | Payer: Self-pay | Source: Ambulatory Visit | Attending: Family Medicine | Admitting: Family Medicine

## 2016-01-04 ENCOUNTER — Ambulatory Visit (INDEPENDENT_AMBULATORY_CARE_PROVIDER_SITE_OTHER): Payer: Self-pay | Admitting: Internal Medicine

## 2016-01-04 ENCOUNTER — Encounter: Payer: Self-pay | Admitting: Internal Medicine

## 2016-01-04 VITALS — BP 139/81 | HR 74 | Temp 98.1°F | Wt 195.8 lb

## 2016-01-04 DIAGNOSIS — N898 Other specified noninflammatory disorders of vagina: Secondary | ICD-10-CM | POA: Insufficient documentation

## 2016-01-04 DIAGNOSIS — Z113 Encounter for screening for infections with a predominantly sexual mode of transmission: Secondary | ICD-10-CM | POA: Insufficient documentation

## 2016-01-04 LAB — POCT URINALYSIS DIP (MANUAL ENTRY)
BILIRUBIN UA: NEGATIVE
GLUCOSE UA: NEGATIVE
Ketones, POC UA: NEGATIVE
Leukocytes, UA: NEGATIVE
NITRITE UA: NEGATIVE
Protein Ur, POC: NEGATIVE
SPEC GRAV UA: 1.015
Urobilinogen, UA: 0.2
pH, UA: 6

## 2016-01-04 LAB — POCT WET PREP (WET MOUNT): CLUE CELLS WET PREP WHIFF POC: POSITIVE

## 2016-01-04 LAB — POCT UA - MICROSCOPIC ONLY

## 2016-01-04 MED ORDER — METRONIDAZOLE 500 MG PO TABS
500.0000 mg | ORAL_TABLET | Freq: Two times a day (BID) | ORAL | Status: DC
Start: 2016-01-04 — End: 2016-01-11

## 2016-01-04 NOTE — Progress Notes (Signed)
   [facility] Phone: [phone]  Subjective:  Vaginal Discharge: Has been having green-yellow discharge for 1-2 weeks. She has a history of Trichomonas and BV. She is sexually active with one female partner, but he recently went back to jail and has recently cheated on her. She has noticed a vaginal odor. She has also noticed mild RLQ "soreness" for 1-2 weeks. No fevers, no chills, no nausea, no vomiting, no diarrhea, no dysuria. She has not noticed any lesions in her genital area. She is interested in full STD testing.  ROS: See HPI for pertinent positives and negatives Past Medical History- Recurrent vaginitis, depression Reviewed problem list.  Medications- reviewed and updated Current Outpatient Prescriptions  Medication Sig Dispense Refill  . albuterol (PROVENTIL HFA;VENTOLIN HFA) 108 (90 BASE) MCG/ACT inhaler Inhale 2 puffs into the lungs every 4 (four) hours as needed for wheezing or shortness of breath (cough). every 4-6 hours for the next 24 1 Inhaler 0  . albuterol (PROVENTIL) (5 MG/ML) 0.5% nebulizer solution Take 2.5 mg by nebulization every 6 (six) hours as needed for wheezing or shortness of breath.    . beclomethasone (QVAR) 80 MCG/ACT inhaler Inhale 1 puff into the lungs 2 (two) times daily. 1 Inhaler 12  . Cyanocobalamin (VITAMIN B 12 PO) Place 1 tablet under the tongue daily.    . cyclobenzaprine (FLEXERIL) 10 MG tablet Take 1 tablet (10 mg total) by mouth 3 (three) times daily as needed for muscle spasms (or pain). 15 tablet 0  . fluticasone (FLONASE) 50 MCG/ACT nasal spray Place 2 sprays into both nostrils daily. 16 g 2  . ibuprofen (ADVIL,MOTRIN) 600 MG tablet Take 1 tablet (600 mg total) by mouth every 6 (six) hours as needed for mild pain or moderate pain. 15 tablet 0  . loratadine (CLARITIN) 10 MG tablet Take 1 tablet (10 mg total) by mouth daily. 30 tablet 0  . montelukast (SINGULAIR) 10 MG tablet Take 1 tablet (10 mg total) by mouth at bedtime.  30 tablet 11  . Multiple Vitamin (MULITIVITAMIN WITH MINERALS) TABS Take 1 tablet by mouth daily.    . Probiotic Product (PRO-BIOTIC BLEND PO) Take 1 tablet by mouth daily.     No current facility-administered medications for this visit.   Chief complaint-noted Family history reviewed for today's visit. No changes. Social history- patient is a never smoker.  Objective: BP 139/81 mmHg  Pulse 74  Temp(Src) 98.1 F (36.7 C) (Oral)  Wt 195 lb 12.8 oz (88.814 kg) Gen: Tearful, cooperative with exam HEENT: NCAT, EOMI, MMM Neck: FROM, supple Resp: Normal work of breathing GI: Soft, NTND, BS present, no guarding or organomegaly GU: External genitalia appears normal with no lesions, copious green malodorous vaginal discharge present, vaginal walls normal, cervix appears normal, no cervical motion tenderness, no adnexal tenderness Msk: Moves UE/LE spontaneously Neuro: Alert and oriented, no gross deficits Skin: Dry erythematous patches of skin present circumferentially around the neck  Assessment/Plan: Vaginal Discharge: Pt with copious green malodorous discharge. No fevers, no chills, no cervical motion tenderness so PID less likely. - Wet prep performed in clinic, consistent with BV - Will treat with Flagyl 500mg  bid x 7 days - UA - GC/Chlamydia, HIV, syphilis - Follow-up as needed   Willadean CarolKaty Tye Juarez, MD PGY-1

## 2016-01-04 NOTE — Addendum Note (Signed)
Addended by: Areta HaberMOREHEAD, Parnell Spieler B on: 01/04/2016 10:16 AM   Modules accepted: Orders

## 2016-01-04 NOTE — Addendum Note (Signed)
Addended by: Jennette BillBUSICK, Nakia Remmers L on: 01/04/2016 10:15 AM   Modules accepted: Orders

## 2016-01-04 NOTE — Assessment & Plan Note (Addendum)
Pt with copious green malodorous discharge. No fevers, no chills, no cervical motion tenderness so PID less likely - Wet prep performed in clinic, consistent with BV - Will treat with Flagyl 500mg  bid x 7 days - UA - GC/Chlamydia, HIV, syphilis - Follow-up as needed

## 2016-01-04 NOTE — Patient Instructions (Addendum)
It was so nice to meet you today!  I will call you if the results of the remainder of your tests are positive. If they are negative, you will receive a letter in the mail.   Your initial test results show that you have bacterial vaginosis. Please take Flagyl 500mg  twice a day for 7 days.  If your vaginal discharge has not gotten better after the course of antibiotics, please come back to see us!  -Dr. Nancy MarusMayo

## 2016-01-05 LAB — CERVICOVAGINAL ANCILLARY ONLY
Chlamydia: NEGATIVE
NEISSERIA GONORRHEA: NEGATIVE

## 2016-01-05 LAB — RPR

## 2016-01-05 LAB — HIV ANTIBODY (ROUTINE TESTING W REFLEX): HIV: NONREACTIVE

## 2016-01-06 ENCOUNTER — Encounter: Payer: Self-pay | Admitting: Internal Medicine

## 2016-01-07 ENCOUNTER — Encounter: Payer: Self-pay | Admitting: Internal Medicine

## 2016-01-07 ENCOUNTER — Telehealth: Payer: Self-pay | Admitting: Student

## 2016-01-07 NOTE — Telephone Encounter (Signed)
Error

## 2016-01-11 ENCOUNTER — Ambulatory Visit (INDEPENDENT_AMBULATORY_CARE_PROVIDER_SITE_OTHER): Payer: Self-pay | Admitting: Family Medicine

## 2016-01-11 ENCOUNTER — Encounter: Payer: Self-pay | Admitting: Family Medicine

## 2016-01-11 VITALS — BP 135/96 | HR 90 | Temp 98.2°F | Wt 195.8 lb

## 2016-01-11 DIAGNOSIS — B9789 Other viral agents as the cause of diseases classified elsewhere: Secondary | ICD-10-CM

## 2016-01-11 DIAGNOSIS — B349 Viral infection, unspecified: Secondary | ICD-10-CM

## 2016-01-11 DIAGNOSIS — J4531 Mild persistent asthma with (acute) exacerbation: Secondary | ICD-10-CM

## 2016-01-11 DIAGNOSIS — J329 Chronic sinusitis, unspecified: Secondary | ICD-10-CM

## 2016-01-11 MED ORDER — PREDNISONE 20 MG PO TABS: 40.0000 mg | ORAL_TABLET | Freq: Every day | ORAL | Status: DC

## 2016-01-11 NOTE — Patient Instructions (Addendum)
Thanks for coming in today.   Use the ibuprofen, neti pot, claritin, deongestant as instructed.   Use the prednisone to help your asthma.   If you develop significant shortness of breath, or chest pain, or are using your albuterol inhaler more frequently than every 2-4 hours, then come back to see Lydia Griffin or go to the ED.   If you develop fevers and sinus pain, or your symptoms are worsening, then come back to see Lydia Griffin.   Thanks for letting Lydia Griffin take care of you.   Sincerely, Devota Pace, MD Family Medicine -PGY2  Asthma Attack Prevention While you may not be able to control the fact that you have asthma, you can take actions to prevent asthma attacks. The best way to prevent asthma attacks is to maintain good control of your asthma. You can achieve this by:  Taking your medicines as directed.  Avoiding things that can irritate your airways or make your asthma symptoms worse (asthma triggers).  Keeping track of how well your asthma is controlled and of any changes in your symptoms.  Responding quickly to worsening asthma symptoms (asthma attack).  Seeking emergency care when it is needed. WHAT ARE SOME WAYS TO PREVENT AN ASTHMA ATTACK? Have a Plan Work with your health care provider to create a written plan for managing and treating your asthma attacks (asthma action plan). This plan includes:  A list of your asthma triggers and how you can avoid them.  Information on when medicines should be taken and when their dosages should be changed.  The use of a device that measures how well your lungs are working (peak flow meter). Monitor Your Asthma Use your peak flow meter and record your results in a journal every day. A drop in your peak flow numbers on one or more days may indicate the start of an asthma attack. This can happen even before you start to feel symptoms. You can prevent an asthma attack from getting worse by following the steps in your asthma action plan. Avoid Asthma  Triggers Work with your asthma health care provider to find out what your asthma triggers are. This can be done by:  Allergy testing.  Keeping a journal that notes when asthma attacks occur and the factors that may have contributed to them.  Determining if there are other medical conditions that are making your asthma worse. Once you have determined your asthma triggers, take steps to avoid them. This may include avoiding excessive or prolonged exposure to:  Dust. Have someone dust and vacuum your home for you once or twice a week. Using a high-efficiency particulate arrestance (HEPA) vacuum is best.  Smoke. This includes campfire smoke, forest fire smoke, and secondhand smoke from tobacco products.  Pet dander. Avoid contact with animals that you know you are allergic to.  Allergens from trees, grasses or pollens. Avoid spending a lot of time outdoors when pollen counts are high, and on very windy days.  Very cold, dry, or humid air.  Mold.  Foods that contain high amounts of sulfites.  Strong odors.  Outdoor air pollutants, such as Museum/gallery exhibitions officer.  Indoor air pollutants, such as aerosol sprays and fumes from household cleaners.  Household pests, including dust mites and cockroaches, and pest droppings.  Certain medicines, including NSAIDs. Always talk to your health care provider before stopping or starting any new medicines. Medicines Take over-the-counter and prescription medicines only as told by your health care provider. Many asthma attacks can be prevented by carefully following  your medicine schedule. Taking your medicines correctly is especially important when you cannot avoid certain asthma triggers. Act Quickly If an asthma attack does happen, acting quickly can decrease how severe it is and how long it lasts. Take these steps:   Pay attention to your symptoms. If you are coughing, wheezing, or having difficulty breathing, do not wait to see if your symptoms go away  on their own. Follow your asthma action plan.  If you have followed your asthma action plan and your symptoms are not improving, call your health care provider or seek immediate medical care at the nearest hospital. It is important to note how often you need to use your fast-acting rescue inhaler. If you are using your rescue inhaler more often, it may mean that your asthma is not under control. Adjusting your asthma treatment plan may help you to prevent future asthma attacks and help you to gain better control of your condition. HOW CAN I PREVENT AN ASTHMA ATTACK WHEN I EXERCISE? Follow advice from your health care provider about whether you should use your fast-acting inhaler before exercising. Many people with asthma experience exercise-induced bronchoconstriction (EIB). This condition often worsens during vigorous exercise in cold, humid, or dry environments. Usually, people with EIB can stay very active by pre-treating with a fast-acting inhaler before exercising.   This information is not intended to replace advice given to you by your health care provider. Make sure you discuss any questions you have with your health care provider.   Document Released: 09/27/2009 Document Revised: 06/30/2015 Document Reviewed: 03/11/2015 Elsevier Interactive Patient Education 2016 Elsevier Inc.    Sinusitis, Adult Sinusitis is redness, soreness, and inflammation of the paranasal sinuses. Paranasal sinuses are air pockets within the bones of your face. They are located beneath your eyes, in the middle of your forehead, and above your eyes. In healthy paranasal sinuses, mucus is able to drain out, and air is able to circulate through them by way of your nose. However, when your paranasal sinuses are inflamed, mucus and air can become trapped. This can allow bacteria and other germs to grow and cause infection. Sinusitis can develop quickly and last only a short time (acute) or continue over a long period  (chronic). Sinusitis that lasts for more than 12 weeks is considered chronic. CAUSES Causes of sinusitis include:  Allergies.  Structural abnormalities, such as displacement of the cartilage that separates your nostrils (deviated septum), which can decrease the air flow through your nose and sinuses and affect sinus drainage.  Functional abnormalities, such as when the small hairs (cilia) that line your sinuses and help remove mucus do not work properly or are not present. SIGNS AND SYMPTOMS Symptoms of acute and chronic sinusitis are the same. The primary symptoms are pain and pressure around the affected sinuses. Other symptoms include:  Upper toothache.  Earache.  Headache.  Bad breath.  Decreased sense of smell and taste.  A cough, which worsens when you are lying flat.  Fatigue.  Fever.  Thick drainage from your nose, which often is green and may contain pus (purulent).  Swelling and warmth over the affected sinuses. DIAGNOSIS Your health care provider will perform a physical exam. During your exam, your health care provider may perform any of the following to help determine if you have acute sinusitis or chronic sinusitis:  Look in your nose for signs of abnormal growths in your nostrils (nasal polyps).  Tap over the affected sinus to check for signs of infection.  View the inside of your sinuses using an imaging device that has a light attached (endoscope). If your health care provider suspects that you have chronic sinusitis, one or more of the following tests may be recommended:  Allergy tests.  Nasal culture. A sample of mucus is taken from your nose, sent to a lab, and screened for bacteria.  Nasal cytology. A sample of mucus is taken from your nose and examined by your health care provider to determine if your sinusitis is related to an allergy. TREATMENT Most cases of acute sinusitis are related to a viral infection and will resolve on their own within 10  days. Sometimes, medicines are prescribed to help relieve symptoms of both acute and chronic sinusitis. These may include pain medicines, decongestants, nasal steroid sprays, or saline sprays. However, for sinusitis related to a bacterial infection, your health care provider will prescribe antibiotic medicines. These are medicines that will help kill the bacteria causing the infection. Rarely, sinusitis is caused by a fungal infection. In these cases, your health care provider will prescribe antifungal medicine. For some cases of chronic sinusitis, surgery is needed. Generally, these are cases in which sinusitis recurs more than 3 times per year, despite other treatments. HOME CARE INSTRUCTIONS  Drink plenty of water. Water helps thin the mucus so your sinuses can drain more easily.  Use a humidifier.  Inhale steam 3-4 times a day (for example, sit in the bathroom with the shower running).  Apply a warm, moist washcloth to your face 3-4 times a day, or as directed by your health care provider.  Use saline nasal sprays to help moisten and clean your sinuses.  Take medicines only as directed by your health care provider.  If you were prescribed either an antibiotic or antifungal medicine, finish it all even if you start to feel better. SEEK IMMEDIATE MEDICAL CARE IF:  You have increasing pain or severe headaches.  You have nausea, vomiting, or drowsiness.  You have swelling around your face.  You have vision problems.  You have a stiff neck.  You have difficulty breathing.   This information is not intended to replace advice given to you by your health care provider. Make sure you discuss any questions you have with your health care provider.   Document Released: 10/09/2005 Document Revised: 10/30/2014 Document Reviewed: 10/24/2011 Elsevier Interactive Patient Education [employer]2016 Elsevier Inc.

## 2016-01-11 NOTE — Progress Notes (Signed)
Patient ID: KILEEN GETCHELL, female   DOB: 1960-12-14, 55 y.o.   MRN: [id number]   [facility] Yolande Jolly, MD Phone: [phone]  Subjective:   # URI - has been having cough / congestion.  - for about a week.  - No fevers or chills.  - No nausea, vomiting or diarrhea.  - Some sinus pain / pressure - no throat pain.  - right sided ear pain / pressure ongoing for weeks. Has been using flonase to help reduce congestion. This has helped some but hard to tell.  - No nasal discharge - cough that is nonproductive.  - she is somewhat short of breath.  - Has asthma, but does not feel that it is worsening, is using inhaler more frequently. Using it every four hours.  - Takes claritin, Qvar, Nebulizer, and prn inhaler.  - Last asthma exacerbation one year ago. Has had to be hospitalized and intubated for asthma previously.  - some dizziness.  - Some chest tightness / pressure.  - No eye pain / pressure.  - Headaches.  - Has also been taking nyquil tablets and some "headache medication".  - does not smoke - does not drink ETOH.   All relevant systems were reviewed and were negative unless otherwise noted in the HPI  Past Medical History Reviewed problem list.  Medications- reviewed and updated Current Outpatient Prescriptions  Medication Sig Dispense Refill  . albuterol (PROVENTIL HFA;VENTOLIN HFA) 108 (90 BASE) MCG/ACT inhaler Inhale 2 puffs into the lungs every 4 (four) hours as needed for wheezing or shortness of breath (cough). every 4-6 hours for the next 24 1 Inhaler 0  . albuterol (PROVENTIL) (5 MG/ML) 0.5% nebulizer solution Take 2.5 mg by nebulization every 6 (six) hours as needed for wheezing or shortness of breath.    . beclomethasone (QVAR) 80 MCG/ACT inhaler Inhale 1 puff into the lungs 2 (two) times daily. 1 Inhaler 12  . Cyanocobalamin (VITAMIN B 12 PO) Place 1 tablet under the tongue daily.    . cyclobenzaprine (FLEXERIL) 10 MG tablet Take 1  tablet (10 mg total) by mouth 3 (three) times daily as needed for muscle spasms (or pain). 15 tablet 0  . fluticasone (FLONASE) 50 MCG/ACT nasal spray Place 2 sprays into both nostrils daily. 16 g 2  . loratadine (CLARITIN) 10 MG tablet Take 1 tablet (10 mg total) by mouth daily. 30 tablet 0  . Multiple Vitamin (MULITIVITAMIN WITH MINERALS) TABS Take 1 tablet by mouth daily.    . Probiotic Product (PRO-BIOTIC BLEND PO) Take 1 tablet by mouth daily.     No current facility-administered medications for this visit.   Chief complaint-noted No additions to family history Social history- patient is a none smoker  Objective: BP 135/96 mmHg  Pulse 90  Temp(Src) 98.2 F (36.8 C) (Oral)  Wt 195 lb 12.8 oz (88.814 kg)  SpO2 95% Gen: NAD, alert, cooperative with exam HEENT: NCAT, EOMI, PERRL, TMs wnl, o/p with small amount of erythema, no LAD, no sinus tenderness to palpation.  Neck: FROM, supple, no LAD CV: RRR, good S1/S2, no murmur Resp: CTABL, no wheezes, non-labored Abd: SNTND, BS present, no guarding or organomegaly Ext: No edema, warm, normal tone, moves UE/LE spontaneously Neuro: Alert and oriented, No gross deficits Skin: no rashes no lesions  Assessment/Plan:  # Sinus Congestion - Viral sinusitis most likely.  - supportive treatment  - Drink plenty of fluids.  - Take a decongestent, take ibuprofen 600mg  every 6  hours, keep taking the claritin.  - Use a neti pot to reduce congestion.   # Asthma Exacerbation - increasing use of albuterol, wheezing noted.  - Prednisone 40mg  x 5 days.  - Continue Qvar 2 puffs BID, Claritin, Albuterol nebulizer and inhaler every 4 hours.  - If symptoms do not improve, then return for follow up.  - If you develop fevers, or if your symptoms worsen over the next 5-7 days. Then come back and see [place] and we may need to start an antibiotic at that point.  - f/u with your pcp 4-6 weeks about your asthma.

## 2016-01-18 ENCOUNTER — Ambulatory Visit: Payer: Self-pay

## 2016-01-24 ENCOUNTER — Ambulatory Visit: Payer: Self-pay | Admitting: Family Medicine

## 2016-01-26 ENCOUNTER — Ambulatory Visit (INDEPENDENT_AMBULATORY_CARE_PROVIDER_SITE_OTHER): Payer: Self-pay | Admitting: Family Medicine

## 2016-01-26 VITALS — BP 146/96 | HR 86 | Temp 98.3°F | Ht 66.0 in | Wt 197.3 lb

## 2016-01-26 DIAGNOSIS — H9311 Tinnitus, right ear: Secondary | ICD-10-CM

## 2016-01-26 NOTE — Patient Instructions (Signed)
Thank you so much for coming to visit today! I have placed a referral to ENT. They should contact you concerning an appointment time.   Thanks again! Dr. Caroleen Hammanumley

## 2016-01-27 NOTE — Progress Notes (Signed)
Subjective:     Patient ID: Lydia Griffin, female   DOB: 1961-06-27, 55 y.o.   MRN: [id number]  HPI Lydia Griffin is a 55yo female presenting today for tinnitus. - Reports a "whooshing" sound in her right ear that is always there. - Symptoms present for 1-2 months - States the only thing that makes the sound go away is if she rotates and sidebends her head to the right. - Denies symptoms in left ear - Dizziness has improved but still present. Mostly present with changes in position. - Denies changes in hearing - Denies fever - Denies ear pain - States sinus infection has resolved. Denies congestion.   Review of Systems Per HPI. Other systems negative.    Objective:   Physical Exam  Constitutional: She appears well-developed and well-nourished. No distress.  HENT:  Head: Normocephalic and atraumatic.  Mouth/Throat: Oropharynx is clear and moist.  Tympanic membranes pearly bilaterally without erythema. Weber test with sound louder in left ear. Rhinne test with left ear with bone conduction of 3 seconds and air conduction of 11 seconds; right ear with 1 second bone conduction and 4 seconds of air conduction.  Cardiovascular: Normal rate and regular rhythm.  Exam reveals no gallop and no friction rub.   Murmur heard. Pulmonary/Chest: Effort normal. No respiratory distress. She has no wheezes.  Psychiatric: She has a normal mood and affect. Her behavior is normal.      Assessment and Plan:     1. Tinnitus, right - Concerning for Meniere's given dizziness, tinnitus, and hearing loss on exam - Referral to ENT placed for further evaluation - Follow up if no improvement

## 2016-05-03 ENCOUNTER — Ambulatory Visit: Payer: Self-pay | Admitting: Student

## 2016-05-12 ENCOUNTER — Emergency Department (HOSPITAL_COMMUNITY): Payer: Self-pay

## 2016-05-12 ENCOUNTER — Telehealth: Payer: Self-pay | Admitting: Family Medicine

## 2016-05-12 ENCOUNTER — Encounter (HOSPITAL_COMMUNITY): Payer: Self-pay | Admitting: *Deleted

## 2016-05-12 DIAGNOSIS — J45901 Unspecified asthma with (acute) exacerbation: Secondary | ICD-10-CM | POA: Insufficient documentation

## 2016-05-12 MED ORDER — ALBUTEROL SULFATE (2.5 MG/3ML) 0.083% IN NEBU
5.0000 mg | INHALATION_SOLUTION | Freq: Once | RESPIRATORY_TRACT | Status: AC
  Administered 2016-05-12: 5 mg via RESPIRATORY_TRACT
  Filled 2016-05-12: qty 6

## 2016-05-12 MED ORDER — IPRATROPIUM BROMIDE 0.02 % IN SOLN
0.5000 mg | Freq: Once | RESPIRATORY_TRACT | Status: AC
  Administered 2016-05-12: 0.5 mg via RESPIRATORY_TRACT
  Filled 2016-05-12: qty 2.5

## 2016-05-12 NOTE — ED Notes (Signed)
The pt has had difficulty breathing for a few days no recent cold or cough.  Her inhaler and her hhn machine is not helping her.  Audible wheezes.

## 2016-05-12 NOTE — Telephone Encounter (Signed)
Received call from patient stating that she was feeling short of breath and was going to the ED. I advised patient that it was a good idea and that she should be evaluated in the ED. Patient had no further questions.  Katina Degreealeb M. Jimmey RalphParker, MD [facility] Resident PGY-3 05/12/2016 9:40 PM

## 2016-05-13 ENCOUNTER — Emergency Department (HOSPITAL_COMMUNITY)
Admission: EM | Admit: 2016-05-13 | Discharge: 2016-05-13 | Disposition: A | Discharge status: Home / Self Care | Payer: Self-pay | Attending: Emergency Medicine | Admitting: Emergency Medicine

## 2016-05-13 DIAGNOSIS — J45901 Unspecified asthma with (acute) exacerbation: Secondary | ICD-10-CM

## 2016-05-13 MED ORDER — PREDNISONE 20 MG PO TABS
60.0000 mg | ORAL_TABLET | Freq: Once | ORAL | Status: AC
  Administered 2016-05-13: 60 mg via ORAL
  Filled 2016-05-13: qty 3

## 2016-05-13 MED ORDER — ALBUTEROL (5 MG/ML) CONTINUOUS INHALATION SOLN
15.0000 mg/h | INHALATION_SOLUTION | Freq: Once | RESPIRATORY_TRACT | Status: AC
  Administered 2016-05-13: 15 mg/h via RESPIRATORY_TRACT
  Filled 2016-05-13: qty 20

## 2016-05-13 MED ORDER — IPRATROPIUM BROMIDE 0.02 % IN SOLN
1.0000 mg | Freq: Once | RESPIRATORY_TRACT | Status: AC
  Administered 2016-05-13: 1 mg via RESPIRATORY_TRACT
  Filled 2016-05-13: qty 5

## 2016-05-13 MED ORDER — PREDNISONE 20 MG PO TABS: 60.0000 mg | ORAL_TABLET | Freq: Every day | ORAL | Status: DC

## 2016-05-13 NOTE — Discharge Instructions (Signed)
Asthma, Acute Bronchospasm °Acute bronchospasm caused by asthma is also referred to as an asthma attack. Bronchospasm means your air passages become narrowed. The narrowing is caused by inflammation and tightening of the muscles in the air tubes (bronchi) in your lungs. This can make it hard to breathe or cause you to wheeze and cough. °CAUSES °Possible triggers are: °· Animal dander from the skin, hair, or feathers of animals. °· Dust mites contained in house dust. °· Cockroaches. °· Pollen from trees or grass. °· Mold. °· Cigarette or tobacco smoke. °· Air pollutants such as dust, household cleaners, hair sprays, aerosol sprays, paint fumes, strong chemicals, or strong odors. °· Cold air or weather changes. Cold air may trigger inflammation. Winds increase molds and pollens in the air. °· Strong emotions such as crying or laughing hard. °· Stress. °· Certain medicines such as aspirin or beta-blockers. °· Sulfites in foods and drinks, such as dried fruits and wine. °· Infections or inflammatory conditions, such as a flu, cold, or inflammation of the nasal membranes (rhinitis). °· Gastroesophageal reflux disease (GERD). GERD is a condition where stomach acid backs up into your esophagus. °· Exercise or strenuous activity. °SIGNS AND SYMPTOMS  °· Wheezing. °· Excessive coughing, particularly at night. °· Chest tightness. °· Shortness of breath. °DIAGNOSIS  °Your health care provider will ask you about your medical history and perform a physical exam. A chest X-ray or blood testing may be performed to look for other causes of your symptoms or other conditions that may have triggered your asthma attack.  °TREATMENT  °Treatment is aimed at reducing inflammation and opening up the airways in your lungs.  Most asthma attacks are treated with inhaled medicines. These include quick relief or rescue medicines (such as bronchodilators) and controller medicines (such as inhaled corticosteroids). These medicines are sometimes  given through an inhaler or a nebulizer. Systemic steroid medicine taken by mouth or given through an IV tube also can be used to reduce the inflammation when an attack is moderate or severe. Antibiotic medicines are only used if a bacterial infection is present.  °HOME CARE INSTRUCTIONS  °· Rest. °· Drink plenty of liquids. This helps the mucus to remain thin and be easily coughed up. Only use caffeine in moderation and do not use alcohol until you have recovered from your illness. °· Do not smoke. Avoid being exposed to secondhand smoke. °· You play a critical role in keeping yourself in good health. Avoid exposure to things that cause you to wheeze or to have breathing problems. °· Keep your medicines up-to-date and available. Carefully follow your health care provider's treatment plan. °· Take your medicine exactly as prescribed. °· When pollen or pollution is bad, keep windows closed and use an air conditioner or go to places with air conditioning. °· Asthma requires careful medical care. See your health care provider for a follow-up as advised. If you are more than [redacted] weeks pregnant and you were prescribed any new medicines, let your obstetrician know about the visit and how you are doing. Follow up with your health care provider as directed. °· After you have recovered from your asthma attack, make an appointment with your outpatient doctor to talk about ways to reduce the likelihood of future attacks. If you do not have a doctor who manages your asthma, make an appointment with a primary care doctor to discuss your asthma. °SEEK IMMEDIATE MEDICAL CARE IF:  °· You are getting worse. °· You have trouble breathing. If severe, call your local   emergency services (911 in the [place]  You develop chest pain or discomfort.  You are vomiting.  You are not able to keep fluids down.  You are coughing up yellow, green, brown, or bloody sputum.  You have a fever and your symptoms suddenly get worse.  You have  trouble swallowing. MAKE SURE YOU:   Understand these instructions.  Will watch your condition.  Will get help right away if you are not doing well or get worse.   This information is not intended to replace advice given to you by your health care provider. Make sure you discuss any questions you have with your health care provider.   Document Released: 01/24/2007 Document Revised: 10/14/2013 Document Reviewed: 04/16/2013 Elsevier Interactive Patient Education [employer]   To find a primary care or specialty doctor please call [phone] or [phone] to access "[place] Find a Doctor Service."  You may also go on the [facility] website at [email or web address]  There are also multiple [facility] practices throughout the [place] that are frequently accepting new patients. You may find a clinic that is close to your home and contact them.  [facility] -  [address] [place] [address] [phone]  [facility] in [place] (also locations in [place] and [place]) -  [address] [place] [address] [phone]  [facility] Department -  [address] [place] [address] [phone]

## 2016-05-13 NOTE — ED Provider Notes (Signed)
By signing my name below, I, Phillis Haggis, attest that this documentation has been prepared under the direction and in the presence of Kristen N Ward, DO. Electronically Signed: Phillis Haggis, ED Scribe. 05/13/2016. 12:51 AM.  TIME SEEN: 1:13 AM  CHIEF COMPLAINT:  Chief Complaint  Patient presents with  . Asthma    HPI:  HPI Comments: Lydia Griffin is a 55 y.o. Female with a hx of asthma and SOB who presents to the Emergency Department complaining of gradually worsening difficulty breathing onset a few days ago. She states that these symptoms are an exacerbation of her asthma. She reports associated wheezing and mild cough. Pt has been using her inhaler and HHN machine at home to no relief. Pt received a breathing treatment in Triage to mild relief. Pt was last admitted to the hospital for asthma related symptoms last year. She denies hx of intubation. Pt does not smoke. She denies recent fever, chills, or other URI symptoms.   ROS: See HPI Constitutional: no fever  Eyes: no drainage  ENT: no runny nose   Cardiovascular:  no chest pain  Resp: SOB  GI: no vomiting GU: no dysuria Integumentary: no rash  Allergy: no hives  Musculoskeletal: no leg swelling  Neurological: no slurred speech ROS otherwise negative  PAST MEDICAL HISTORY/PAST SURGICAL HISTORY:  Past Medical History  Diagnosis Date  . Asthma     Started as a child; Has been intubated for asthma exacerbation twice in approx 1990  . Shortness of breath     "related to asthma attacks" (09/19/2012)    MEDICATIONS:  Prior to Admission medications   Medication Sig Start Date End Date Taking? Authorizing Provider  albuterol (PROVENTIL HFA;VENTOLIN HFA) 108 (90 BASE) MCG/ACT inhaler Inhale 2 puffs into the lungs every 4 (four) hours as needed for wheezing or shortness of breath (cough). every 4-6 hours for the next 24 02/16/14   Charlane Ferretti, MD  albuterol (PROVENTIL) (5 MG/ML) 0.5% nebulizer solution Take 2.5 mg by  nebulization every 6 (six) hours as needed for wheezing or shortness of breath.    Historical Provider, MD  beclomethasone (QVAR) 80 MCG/ACT inhaler Inhale 1 puff into the lungs 2 (two) times daily. 09/30/15   Federico Flake, MD  Cyanocobalamin (VITAMIN B 12 PO) Place 1 tablet under the tongue daily.    Historical Provider, MD  cyclobenzaprine (FLEXERIL) 10 MG tablet Take 1 tablet (10 mg total) by mouth 3 (three) times daily as needed for muscle spasms (or pain). 07/19/15   Trixie Dredge, PA-C  fluticasone (FLONASE) 50 MCG/ACT nasal spray Place 2 sprays into both nostrils daily. 12/08/15   Smitty Cords, DO  loratadine (CLARITIN) 10 MG tablet Take 1 tablet (10 mg total) by mouth daily. 02/16/14   Charlane Ferretti, MD  Multiple Vitamin (MULITIVITAMIN WITH MINERALS) TABS Take 1 tablet by mouth daily.    Historical Provider, MD  predniSONE (DELTASONE) 20 MG tablet Take 2 tablets (40 mg total) by mouth daily with breakfast. 01/11/16   Yolande Jolly, MD  Probiotic Product (PRO-BIOTIC BLEND PO) Take 1 tablet by mouth daily.    Historical Provider, MD    ALLERGIES:  No Known Allergies  SOCIAL HISTORY:  Social History  Substance Use Topics  . Smoking status: Never Smoker   . Smokeless tobacco: Never Used  . Alcohol Use: No    FAMILY HISTORY: Family History  Problem Relation Age of Onset  . Asthma Mother   . Heart disease Father 9  MI  . Alcohol abuse Father   . Asthma Sister 9    death from asthma    EXAM: BP 132/87 mmHg  Pulse 93  Temp(Src) 98.2 F (36.8 C) (Oral)  Resp 20  SpO2 95% CONSTITUTIONAL: Alert and oriented and responds appropriately to questions. Well-appearing; well-nourished, No respiratory distress, no increased work of breathing HEAD: Normocephalic EYES: Conjunctivae clear, PERRL ENT: normal nose; no rhinorrhea; moist mucous membranes NECK: Supple, no meningismus, no LAD  CARD: RRR; S1 and S2 appreciated; no murmurs, no clicks, no rubs, no  gallops RESP: Normal chest excursion without splinting or tachypnea; breath sounds equal bilaterally; diffuse expiratory wheeze and slightly diminished aeration at her bases bilaterally, no rhonchi, no rales, no hypoxia or respiratory distress, speaking full sentences ABD/GI: Normal bowel sounds; non-distended; soft, non-tender, no rebound, no guarding, no peritoneal signs BACK:  The back appears normal and is non-tender to palpation, there is no CVA tenderness EXT: Normal ROM in all joints; non-tender to palpation; no edema; normal capillary refill; no cyanosis, no calf tenderness or swelling    SKIN: Normal color for age and race; warm; no rash NEURO: Moves all extremities equally, sensation to light touch intact diffusely, cranial nerves II through XII intact PSYCH: The patient's mood and manner are appropriate. Grooming and personal hygiene are appropriate.  MEDICAL DECISION MAKING: Patient here with asthma exacerbation. Given breathing treatment in the waiting room with some relief. Chest x-ray clear. We'll give continuous albuterol and Atrovent, prednisone and reassess.  ED PROGRESS: Patient's lungs are now is completely clear after continuous treatment. She feels ready for discharge home. States she has plenty of nebulizer albuterol and an albuterol inhaler at home. We'll discharge with prednisone burst.   At this time, I do not feel there is any life-threatening condition present. I have reviewed and discussed all results (EKG, imaging, lab, urine as appropriate), exam findings with patient. I have reviewed nursing notes and appropriate previous records.  I feel the patient is safe to be discharged home without further emergent workup. Discussed usual and customary return precautions. Patient and family (if present) verbalize understanding and are comfortable with this plan.  Patient will follow-up with their primary care provider. If they do not have a primary care provider, information for  follow-up has been provided to them. All questions have been answered.   I personally performed the services described in this documentation, which was scribed in my presence. The recorded information has been reviewed and is accurate.    Layla Maw Ward, DO 05/13/16 [phone]

## 2016-05-17 ENCOUNTER — Ambulatory Visit: Payer: Self-pay | Admitting: Student

## 2016-05-25 ENCOUNTER — Encounter: Payer: Self-pay | Admitting: Family Medicine

## 2016-05-25 ENCOUNTER — Ambulatory Visit (INDEPENDENT_AMBULATORY_CARE_PROVIDER_SITE_OTHER): Payer: Self-pay | Admitting: Family Medicine

## 2016-05-25 VITALS — BP 159/89 | HR 80 | Temp 98.4°F | Wt 210.4 lb

## 2016-05-25 DIAGNOSIS — J45909 Unspecified asthma, uncomplicated: Secondary | ICD-10-CM

## 2016-05-25 MED ORDER — PREDNISONE 20 MG PO TABS: ORAL_TABLET | ORAL | 0 refills | Status: DC

## 2016-05-25 MED ORDER — ALBUTEROL SULFATE (5 MG/ML) 0.5% IN NEBU: 2.5000 mg | INHALATION_SOLUTION | Freq: Four times a day (QID) | RESPIRATORY_TRACT | 3 refills | Status: DC | PRN

## 2016-05-25 MED ORDER — IPRATROPIUM-ALBUTEROL 0.5-2.5 (3) MG/3ML IN SOLN
3.0000 mL | Freq: Once | RESPIRATORY_TRACT | Status: AC
  Administered 2016-05-25: 3 mL via RESPIRATORY_TRACT

## 2016-05-25 MED ORDER — ALBUTEROL SULFATE HFA 108 (90 BASE) MCG/ACT IN AERS: 2.0000 | INHALATION_SPRAY | RESPIRATORY_TRACT | 0 refills | Status: DC | PRN

## 2016-05-25 MED ORDER — PREDNISONE 50 MG PO TABS: 50.0000 mg | ORAL_TABLET | Freq: Every day | ORAL | 0 refills | Status: DC

## 2016-05-25 MED ORDER — ALBUTEROL SULFATE (2.5 MG/3ML) 0.083% IN NEBU
2.5000 mg | INHALATION_SOLUTION | Freq: Once | RESPIRATORY_TRACT | Status: AC
  Administered 2016-05-25 (×2): 2.5 mg via RESPIRATORY_TRACT

## 2016-05-25 NOTE — Assessment & Plan Note (Signed)
-   Duoneb given in clinic with some improvement - Albuterol inhaler and nebulizer refilled - Discussed Dulera with Pharmacy. Unfortunately not any more affordable options. Our clinic occasionally has samples of Dulera, with next shipment due in ~2weeks. Pharmacist requests note be routed to him so he can contact her once samples arrive. - Will arrange for Prednisone taper given worsening when previous steroid burst completed. 60mg  x2 days, 50mg  x2 days, 40mg  x2 days, 20mg  x3 days. - Return if symptoms worsen or fail to resolve

## 2016-05-25 NOTE — Progress Notes (Signed)
Subjective:     Patient ID: Lydia Griffin, female   DOB: 08-14-1961, 55 y.o.   MRN: [id number]  HPI Mrs. Gabin is a 55yo female presenting today with concern for asthma flare. - Recently seen in ED on 7/22 with asthma exacerbation. Albuterol and Atrovent given. Prescription for Prednisone given. - Reports improvement with Prednisone, but started to worsen again immediately after completing course - Reports history of wheezing for the last several weeks.  - Uses albuterol multiple times throughout the day. Out of albuterol nebulizer, which normally helps. Requests refill - Unable to afford Dulera (~$300-400) so has not had it over the last several months. Believes that this is a factor in her multiple recent exacerbations. - Denies fever - Mild nonproductive cough - Notes some shortness of breath with exertion. None at rest. - No history of intubation  - Nonsmoker  Review of Systems Per HPI. Other systems negative.    Objective:   Physical Exam  Constitutional: She appears well-developed and well-nourished. No distress.  Cardiovascular: Normal rate and regular rhythm.  Exam reveals no gallop and no friction rub.   No murmur heard. Pulmonary/Chest: No respiratory distress. She has wheezes.  Speaking in complete sentences. Wheezing improved s/p duoneb.  Skin: No rash noted.  Psychiatric: She has a normal mood and affect. Her behavior is normal.      Assessment and Plan:     Asthma, chronic - Duoneb given in clinic with some improvement - Albuterol inhaler and nebulizer refilled - Discussed Dulera with Pharmacy. Unfortunately not any more affordable options. Our clinic occasionally has samples of Dulera, with next shipment due in ~2weeks. Pharmacist requests note be routed to him so he can contact her once samples arrive. - Will arrange for Prednisone taper given worsening when previous steroid burst completed. 60mg  x2 days, 50mg  x2 days, 40mg  x2 days, 20mg  x3 days. - Return if  symptoms worsen or fail to resolve

## 2016-05-25 NOTE — Patient Instructions (Signed)
-   Please continue albuterol as needed - Please take Prednisone 60mg  x2 days, 50mg  x2 days, 40mg  x2 days, and 20mg  x3 days. - We will contact you once we have Dulera samples - Please return if symptoms worsen or fail to resolve.

## 2016-05-26 ENCOUNTER — Telehealth: Payer: Self-pay | Admitting: *Deleted

## 2016-05-26 NOTE — Progress Notes (Signed)
Asked by Dr. Caroleen Hamman to help with [facility] samples for this patient.   Phone call to patient: 05/26/2016 Patient reports taking 2 puffs twice daily of the Dulera 200/5.   Medication Samples have been provided to the patient.  For her pick up at the [facility] front desk on Monday 8/7.   Drug name: [name]       Strength: 200/5        Qty: 2  LOT: [id number]  Exp.Date: 10/10/2016  Dosing instructions: 2 inhalations BID  The patient has been instructed regarding the correct time, dose, and frequency of taking this medication, including desired effects and most common side effects.   Placed at front desk for pick up.   Kathrin Ruddy 4:17 PM 05/26/2016

## 2016-05-26 NOTE — Telephone Encounter (Signed)
Received fax from [facility] stating they don't carry albuterol 0.5% neb solution.  Please send in new Rx for albuterol 2.5mg /3 ml prefilled vials.  Clovis Pu, RN

## 2016-05-30 ENCOUNTER — Other Ambulatory Visit: Payer: Self-pay | Admitting: Family Medicine

## 2016-05-30 MED ORDER — ALBUTEROL SULFATE (2.5 MG/3ML) 0.083% IN NEBU: 2.5000 mg | INHALATION_SOLUTION | Freq: Four times a day (QID) | RESPIRATORY_TRACT | 1 refills | Status: DC | PRN

## 2016-05-30 NOTE — Progress Notes (Signed)
Pharmacy did not carry concentration of albuterol nebulizer initially prescribed. New prescription sent to pharmacy.

## 2016-07-25 ENCOUNTER — Ambulatory Visit (INDEPENDENT_AMBULATORY_CARE_PROVIDER_SITE_OTHER): Payer: Self-pay | Admitting: Student

## 2016-07-25 ENCOUNTER — Other Ambulatory Visit (HOSPITAL_COMMUNITY)
Admission: RE | Admit: 2016-07-25 | Discharge: 2016-07-25 | Disposition: A | Discharge status: Home / Self Care | Payer: Self-pay | Source: Ambulatory Visit | Attending: Family Medicine | Admitting: Family Medicine

## 2016-07-25 ENCOUNTER — Encounter: Payer: Self-pay | Admitting: Student

## 2016-07-25 VITALS — BP 137/87 | HR 83 | Temp 98.6°F | Ht 66.0 in | Wt 210.4 lb

## 2016-07-25 DIAGNOSIS — Z23 Encounter for immunization: Secondary | ICD-10-CM

## 2016-07-25 DIAGNOSIS — Z9189 Other specified personal risk factors, not elsewhere classified: Secondary | ICD-10-CM

## 2016-07-25 DIAGNOSIS — Z113 Encounter for screening for infections with a predominantly sexual mode of transmission: Secondary | ICD-10-CM | POA: Insufficient documentation

## 2016-07-25 DIAGNOSIS — A599 Trichomoniasis, unspecified: Secondary | ICD-10-CM

## 2016-07-25 DIAGNOSIS — N898 Other specified noninflammatory disorders of vagina: Secondary | ICD-10-CM

## 2016-07-25 DIAGNOSIS — N760 Acute vaginitis: Secondary | ICD-10-CM | POA: Insufficient documentation

## 2016-07-25 LAB — POCT WET PREP (WET MOUNT): Clue Cells Wet Prep Whiff POC: POSITIVE

## 2016-07-25 MED ORDER — METRONIDAZOLE 500 MG PO TABS: 2000.0000 mg | ORAL_TABLET | Freq: Once | ORAL | 0 refills | Status: AC

## 2016-07-25 NOTE — Progress Notes (Signed)
   Subjective:    Patient ID: Ginger OrganValerie O Stammen is a 55 y.o. old female.  HPI #Burning and itching of the vulvar area: this has been going on for three days. The symptoms are there all the time. No difference with urination. Yellowish discharge for one day. Denies fever, chills, new back pain, belly pain. No vaginal bleeding. Denies skin lesion. Sexually active with one female partner over the last 12 months. She has a concern that her partner having an affair with another women. She has history of trichomoniasis in the past. Last sexual intercourse 4 days ago. Denies using condom. LMP about a year ago.   PMH: has history of trich few months ago.   SH: denies smoking, drinking or recreational drug use.   Review of Systems Per HPI Objective:   Vitals:   07/25/16 1056  BP: 137/87  Pulse: 83  Temp: 98.6 F (37 C)  TempSrc: Oral  Weight: 210 lb 6.4 oz (95.4 kg)  Height: 5\' 6"  (1.676 m)    GEN: appears well, no apparent distress. RESP: no increased work of breathing GI: Bowel sounds present and normal, soft, non-tender,non-distended GU: No suprapubic or CVA tenderness. External genitalia appears normal Speculum exam: Remarkable for abundant whitish to yellowish discharge, no apparent vaginal or cervical lesion, no vaginal bleeding Bimanual exam: Negative for cervical motion tenderness, no palpable adnexal mass NEURO: alert and oriented appropriately, no gross defecits  PSYCH: She was tearful when told about her weight prep results.     Assessment & Plan:  Vaginal discharge Wet prep remarkable for trichomoniasis. Gave prescription for metronidazole 2 g stat. Emphasized about the importance of partner treatment. GC/CT, HIV, Hep B sAg and RPR pending.   Received a flu shot today

## 2016-07-25 NOTE — Patient Instructions (Signed)
It was great seeing you today! We have addressed the following issues today  1. Vaginal discharge: Your test results shows trichomoniasis. This is a sexually transmitted infection. I have sent a prescription to the pharmacy. I also recommend that your sexual partner is treated. It is very important that you avoid sexual contact or use condoms until he sexual partner is treated. We will let you know about your test results when they come out.    If we did any lab work today, and the results require attention, either me or my nurse will get in touch with you. If everything is normal, you will get a letter in mail. If you don't hear from us in two weeks, please give us a call. Otherwise, I look forward to talking with you again at our next visit. If you have any questions or concerns before then, please call the clinic at [phone].  Please bring all your medications to every doctors visit   Sign up for My Chart to have easy access to your labs results, and communication with your Primary care physician.    Please check-out at the front desk before leaving the clinic.   Take Care,

## 2016-07-25 NOTE — Assessment & Plan Note (Signed)
Wet prep remarkable for trichomoniasis. Gave prescription for metronidazole 2 g stat. Emphasized about the importance of partner treatment. GC/CT, HIV, Hep B sAg and RPR pending.

## 2016-07-26 LAB — RPR

## 2016-07-26 LAB — HIV ANTIBODY (ROUTINE TESTING W REFLEX): HIV 1&2 Ab, 4th Generation: NONREACTIVE

## 2016-07-26 LAB — HEPATITIS B SURFACE ANTIGEN: HEP B S AG: NEGATIVE

## 2016-07-27 ENCOUNTER — Ambulatory Visit (INDEPENDENT_AMBULATORY_CARE_PROVIDER_SITE_OTHER): Payer: Self-pay | Admitting: *Deleted

## 2016-07-27 ENCOUNTER — Ambulatory Visit: Payer: Self-pay

## 2016-07-27 ENCOUNTER — Encounter: Payer: Self-pay | Admitting: Student

## 2016-07-27 ENCOUNTER — Telehealth: Payer: Self-pay | Admitting: Student

## 2016-07-27 ENCOUNTER — Other Ambulatory Visit: Payer: Self-pay | Admitting: Student

## 2016-07-27 DIAGNOSIS — A749 Chlamydial infection, unspecified: Secondary | ICD-10-CM

## 2016-07-27 DIAGNOSIS — A599 Trichomoniasis, unspecified: Secondary | ICD-10-CM | POA: Insufficient documentation

## 2016-07-27 LAB — CERVICOVAGINAL ANCILLARY ONLY
CHLAMYDIA, DNA PROBE: POSITIVE — AB
NEISSERIA GONORRHEA: NEGATIVE
Trichomonas: POSITIVE — AB

## 2016-07-27 MED ORDER — AZITHROMYCIN 500 MG PO TABS
1000.0000 mg | ORAL_TABLET | Freq: Once | ORAL | Status: AC
  Administered 2016-07-27: 1000 mg via ORAL

## 2016-07-27 MED ORDER — AZITHROMYCIN 250 MG PO TABS: 1000.0000 mg | ORAL_TABLET | Freq: Once | ORAL | 0 refills | Status: DC

## 2016-07-27 NOTE — Assessment & Plan Note (Signed)
Azithromycin 1 gm once. Discussed about partner treatment and need for repeat test in three months.
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
## 2016-07-27 NOTE — Assessment & Plan Note (Signed)
Metronidazole 2 mg once. Discussed about the need of partner treatment as well.

## 2016-07-27 NOTE — Telephone Encounter (Signed)
Called patient to discuss about her lab results from recent visit. Her test is positive for trichomoniasis and chlamydia. She is already treated for trichomoniasis with 2 g Flagyl. I discussed about the option of coming back to the clinic or sending a prescription to the pharmacy for chlamydia. She chose the later. I sent a prescription for azithromycin 1 g stat to her pharmacy. I also discussed about his partner treatment for both conditions and repeat this in 3 months. Patient voiced understanding and agreed to do as advised.

## 2016-08-07 ENCOUNTER — Ambulatory Visit (INDEPENDENT_AMBULATORY_CARE_PROVIDER_SITE_OTHER): Payer: Self-pay | Admitting: Family Medicine

## 2016-08-07 ENCOUNTER — Encounter: Payer: Self-pay | Admitting: Family Medicine

## 2016-08-07 DIAGNOSIS — J4541 Moderate persistent asthma with (acute) exacerbation: Secondary | ICD-10-CM

## 2016-08-07 MED ORDER — PREDNISONE 50 MG PO TABS: 50.0000 mg | ORAL_TABLET | Freq: Every day | ORAL | 0 refills | Status: AC

## 2016-08-07 MED ORDER — ALBUTEROL SULFATE (2.5 MG/3ML) 0.083% IN NEBU: 2.5000 mg | INHALATION_SOLUTION | Freq: Four times a day (QID) | RESPIRATORY_TRACT | 1 refills | Status: DC | PRN

## 2016-08-07 MED ORDER — DULERA 200-5 MCG/ACT IN AERO: 2.0000 | INHALATION_SPRAY | Freq: Two times a day (BID) | RESPIRATORY_TRACT | 1 refills | Status: DC

## 2016-08-07 MED ORDER — ALBUTEROL SULFATE HFA 108 (90 BASE) MCG/ACT IN AERS: 2.0000 | INHALATION_SPRAY | RESPIRATORY_TRACT | 0 refills | Status: DC | PRN

## 2016-08-07 NOTE — Patient Instructions (Signed)
It was a pleasure to meet you today. Please see below to review our plan for today's visit.  1. Please take the 50 mg prednisone over the course of 5 days. 2. You have been given 2 samples of Dulera. Please continue filling out you orange card information so we can have affordable options for you in the future. 3. Your rescue and nebulizer albuterol have been refilled. Please pick these up at the pharmacy. 4. Return to clinic in 1 week.  Please call the clinic at [phone] if your symptoms worsen or you have any concerns. It was my pleasure to see you. -- Durward Parcelavid Eulamae Greenstein, DO [facility], PGY-1

## 2016-08-07 NOTE — Assessment & Plan Note (Deleted)
A 

## 2016-08-07 NOTE — Progress Notes (Signed)
Subjective:   Patient ID: Lydia [facility]    DOB: [birth year], 55 y.o. female   MRN: [id number]  CC: Chest Congestion  HPI: Lydia [facility] is a 55 y.o. female who presents to clinic today for chest congestion and dypnea. Problems discussed today are as follows:  Chest congestion: onset 2 days ago. Chest pain is tight in character with non-productive cough. Says her throat feels scratchy. Denies fevers, chills, nausea, abdominal pain, change in appetite, rhinorrhea, sore throat, headache, or sick contacts. Patient states she typically has issues with her asthma when the seasons change and says "the cold front over the past few days have been difficult." Has recently increased her SABA use to q2h with neb treatments BID with relief. Gave herself a neb treatment prior to appointment. [facility] but cannot [facility]. Was on Qvar but ran out a few weeks ago.   ROS: See HPI for pertinent ROS.  PMFSH: Pertinent past medical, surgical, family, and social history were reviewed and updated as appropriate. Smoking status reviewed.  Medications reviewed. Current Outpatient Prescriptions  Medication Sig Dispense Refill  . albuterol (PROVENTIL HFA;VENTOLIN HFA) 108 (90 Base) MCG/ACT inhaler Inhale 2 puffs into the lungs every 4 (four) hours as needed for wheezing or shortness of breath (cough). every 4-6 hours for the next 24 1 Inhaler 0  . albuterol (PROVENTIL) (2.5 MG/3ML) 0.083% nebulizer solution Take 3 mLs (2.5 mg total) by nebulization every 6 (six) hours as needed for wheezing or shortness of breath. 150 mL 1  . Cyanocobalamin (VITAMIN B 12 PO) Place 1 tablet under the tongue daily.    . fluticasone (FLONASE) 50 MCG/ACT nasal spray Place 2 sprays into both nostrils daily. 16 g 2  . loratadine (CLARITIN) 10 MG tablet Take 1 tablet (10 mg total) by mouth daily. 30 tablet 0  . Multiple Vitamin (MULITIVITAMIN WITH MINERALS) TABS Take 1 tablet by mouth daily.      . Probiotic Product (PRO-BIOTIC BLEND PO) Take 1 tablet by mouth daily.    . beclomethasone (QVAR) 80 MCG/ACT inhaler Inhale 1 puff into the lungs 2 (two) times daily. (Patient not taking: Reported on 08/07/2016) 1 Inhaler 12  . cyclobenzaprine (FLEXERIL) 10 MG tablet Take 1 tablet (10 mg total) by mouth 3 (three) times daily as needed for muscle spasms (or pain). (Patient not taking: Reported on 08/07/2016) 15 tablet 0  . DULERA 200-5 MCG/ACT AERO Inhale 2 puffs into the lungs 2 (two) times daily. 1 Inhaler 1  . predniSONE (DELTASONE) 50 MG tablet Take 1 tablet (50 mg total) by mouth daily with breakfast. 5 tablet 0   No current facility-administered medications for this visit.     Objective:   BP 130/87   Pulse 87   Temp 98.3 F (36.8 C) (Oral)   Ht 5\' 6"  (1.676 m)   Wt 209 lb (94.8 kg)   SpO2 (!) 89%   BMI 33.73 kg/m  Vitals and nursing note reviewed.  General: well nourished, well developed, in no acute distress with non-toxic appearance HEENT: normocephalic, atraumatic, moist mucous membranes, no rhinorrhea, clear pharynx Neck: supple, non-tender without lymphadenopathy CV: regular rate and rhythm without murmurs, rubs, or gallops, sternal chest pain reproducible on palpation Lungs: expiratory wheeze bilaterally worse at apicies with normal work of breathing, non-productive cough on exam Abdomen: soft, non-tender, non-distended, no masses or organomegaly palpable, normoactive bowel sounds Skin: warm, dry, no rashes or lesions, cap refill < 2  seconds Extremities: warm and well perfused, normal tone  Assessment & Plan:   Asthma with acute exacerbation Acute exacerbation on chronic moderate persistent asthma. Source is likely seasonal allergies given h/o allergic rhinitis and dramatic change in weather over past few days. Patient sat at 89% in office without increased work of breathing able to speak in full sentences without signs of distress. Has a long h/o hospitalizations,  will have a low threshold for agressive treatment. - Prednisone burst 50 mg over x5 days - Proventil rescue inhaler and nebulizer solution refilled - Given 2 samples of Dulera due to financial hardship, patient working on obtaining orange card - Will hold Qvar due to inferior response compared to [facility] - Encouraged patient to maintain adequate water intake - Patient understands red flags when to seek emergent care if symptoms worsen - Return in 1 week  No orders of the defined types were placed in this encounter.  Meds ordered this encounter  Medications  . DULERA 200-5 MCG/ACT AERO    Sig: Inhale 2 puffs into the lungs 2 (two) times daily.    Dispense:  1 Inhaler    Refill:  1    Order Specific Question:   Lot Number?    Answer:   [id number]    Order Specific Question:   Expiration Date?    Answer:   05/07/2017    Order Specific Question:   Quantity    Answer:   2  . predniSONE (DELTASONE) 50 MG tablet    Sig: Take 1 tablet (50 mg total) by mouth daily with breakfast.    Dispense:  5 tablet    Refill:  0  . albuterol (PROVENTIL HFA;VENTOLIN HFA) 108 (90 Base) MCG/ACT inhaler    Sig: Inhale 2 puffs into the lungs every 4 (four) hours as needed for wheezing or shortness of breath (cough). every 4-6 hours for the next 24    Dispense:  1 Inhaler    Refill:  0  . albuterol (PROVENTIL) (2.5 MG/3ML) 0.083% nebulizer solution    Sig: Take 3 mLs (2.5 mg total) by nebulization every 6 (six) hours as needed for wheezing or shortness of breath.    Dispense:  150 mL    Refill:  1    Durward Parcel, DO [facility], PGY-1 08/07/2016 5:08 PM

## 2016-08-07 NOTE — Assessment & Plan Note (Addendum)
Acute exacerbation on chronic moderate persistent asthma. Source is likely seasonal allergies given h/o allergic rhinitis and dramatic change in weather over past few days. Patient sat at 89% in office without increased work of breathing able to speak in full sentences without signs of distress. Has a long h/o hospitalizations, will have a low threshold for agressive treatment. - Prednisone burst 50 mg over x5 days - Proventil rescue inhaler and nebulizer solution refilled - Given 2 samples of Dulera due to financial hardship, patient working on obtaining orange card - [facility] - Encouraged patient to maintain adequate water intake - Patient understands red flags when to seek emergent care if symptoms worsen - Return in 1 week

## 2016-08-14 ENCOUNTER — Ambulatory Visit (INDEPENDENT_AMBULATORY_CARE_PROVIDER_SITE_OTHER): Payer: Self-pay | Admitting: Internal Medicine

## 2016-08-14 ENCOUNTER — Encounter: Payer: Self-pay | Admitting: Internal Medicine

## 2016-08-14 DIAGNOSIS — R0602 Shortness of breath: Secondary | ICD-10-CM

## 2016-08-14 MED ORDER — AMOXICILLIN-POT CLAVULANATE 875-125 MG PO TABS: 1.0000 | ORAL_TABLET | Freq: Two times a day (BID) | ORAL | 0 refills | Status: DC

## 2016-08-14 NOTE — Progress Notes (Signed)
   Lydia [facility] Phone: [phone]  Subjective:  Lydia [facility] is a 55 year old female presenting to [facility] with chest congestion and shortness of breath for the last two weeks. She states she started having cough and congestion two weeks ago. She then developed an asthma exacerbation with shortness of breath. She was seen in [facility] on 10/16. She was thought to have an asthma exacerbation and was treated with Prednisone 50mg  x 5 days. She was advised to continue Albuterol prn and she was given Elwin Sleightulera samples [name](Dulera has worked much better than Qvar for her in the past). Since her last [facility] visit, she states that her breathing has gotten a little better, but she has had nausea, vomiting, and ear pain. She has also developed cough productive of green sputum and has had fevers to 102.6. She states she took the 5 day Prednisone course and has been using her Albuterol inhaler or nebulizer 3-4 times per day.   ROS: See HPI for pertinent positives and negatives  Past Medical History- asthma, overweight, depression  Family history reviewed for today's visit. No changes.  Social history- patient is a never smoker  Objective: BP 115/68 (BP Location: Left Arm, Patient Position: Sitting, Cuff Size: Normal)   Pulse 87   Temp 98.2 F (36.8 C) (Oral)   Ht 5\' 6"  (1.676 m)   Wt 206 lb 12.8 oz (93.8 kg)   BMI 33.38 kg/m  Gen: NAD, alert, cooperative with exam HEENT: NCAT, EOMI, MMM Neck: FROM, supple, no cervical lymphadenopathy CV: RRR, no murmur Resp: Normal work of breathing, able to speak in full sentences, moderate diffuse expiratory wheeze auscultated throughout all lung fields, moderate air movement, no crackles Abdomen: +BS, soft, NTND Neuro: Alert and oriented, no gross deficits  Assessment/Plan: Shortness of Breath: Pt initially had what sounds like a viral infection that caused an asthma exacerbation. She was treated with a 5 day course of Prednisone, with minimal  improvement in her shortness of breath. She has continued to use Albuterol 3-4 times per day. Since her last [facility] visit, she has developed fevers to 102.6, cough productive of green sputum, and nausea/vomiting. Concern for possible CAP vs continued asthma exacerbation. On exam, she has a normal work of breathing and is able to speak in full sentences. - Pt ambulated with pulse ox and was able to keep O2 sats > 96%. - Given her high fevers and green sputum production, will treat with Augmentin x 7 days for CAP - Considered getting CXR, but did not think this would change management - Pt should continue using Albuterol q4hrs prn and Dulera 2 puffs bid - Pt advised to follow-up in 1 week if not improving. At that time, could consider CXR or broadening antibiotic coverage - Return precautions discussed   Willadean CarolKaty Mayo, MD PGY-2

## 2016-08-14 NOTE — Patient Instructions (Addendum)
It was so nice to see you again!  I have prescribed antibiotics to treat a possible pneumonia. Please take Augmentin twice a day for 7 days. If you are not better after those 7 days, please come back to see us and we will probably need to get a chest x-ray at that time.  If you have any severe shortness of breath that does not get better with the Albuterol, if you have any chest pain that does not get better after you are done coughing, or if you have vomiting that prevents you from being able to keep down the antibiotics, please come back to see us!  -Dr. Nancy MarusMayo

## 2016-08-14 NOTE — Assessment & Plan Note (Signed)
Pt initially had what sounds like a viral infection that caused an asthma exacerbation. She was treated with a 5 day course of Prednisone, with minimal improvement in her shortness of breath. She has continued to use Albuterol 3-4 times per day. Since her last clinic visit, she has developed fevers to 102.6, cough productive of green sputum, and nausea/vomiting. Concern for possible CAP vs continued asthma exacerbation. On exam, she has a normal work of breathing and is able to speak in full sentences. - Pt ambulated with pulse ox and was able to keep O2 sats > 96%. - Given her high fevers and green sputum production, will treat with Augmentin x 7 days for CAP - Considered getting CXR, but did not think this would change management - Pt should continue using Albuterol q4hrs prn and Dulera 2 puffs bid - Pt advised to follow-up in 1 week if not improving. At that time, could consider CXR or broadening antibiotic coverage - Return precautions discussed

## 2016-09-22 ENCOUNTER — Telehealth: Payer: Self-pay | Admitting: Student

## 2016-09-22 NOTE — Telephone Encounter (Signed)
[name] patient assistance program enrollment  form dropped off for at front desk for completion.  Verified that patient section of form has been completed.  Last DOS was 08/14/16.  [facility] to be completed by clinical staff.  Lina Sarheryl A Stanley

## 2016-09-25 NOTE — Telephone Encounter (Signed)
It is for albuterol or pro air  and dulara

## 2016-09-25 NOTE — Telephone Encounter (Signed)
LM for patient to call back.  Need to find out which medications she needs [facility].  Form is in blue team folder until we hear back from her.  Lamarr Feenstra,CMA

## 2016-10-23 ENCOUNTER — Encounter (HOSPITAL_COMMUNITY): Payer: Self-pay | Admitting: Emergency Medicine

## 2016-10-23 ENCOUNTER — Ambulatory Visit (INDEPENDENT_AMBULATORY_CARE_PROVIDER_SITE_OTHER): Payer: 59

## 2016-10-23 ENCOUNTER — Ambulatory Visit (HOSPITAL_COMMUNITY)
Admission: EM | Admit: 2016-10-23 | Discharge: 2016-10-23 | Disposition: A | Discharge status: Home / Self Care | Payer: 59 | Attending: Family Medicine | Admitting: Family Medicine

## 2016-10-23 DIAGNOSIS — M25511 Pain in right shoulder: Secondary | ICD-10-CM | POA: Diagnosis not present

## 2016-10-23 MED ORDER — DICLOFENAC SODIUM 1 % TD GEL: 4.0000 g | Freq: Four times a day (QID) | TRANSDERMAL | 1 refills | Status: DC

## 2016-10-23 NOTE — ED Triage Notes (Signed)
[facility] a [facility] right [facility] pain x 4 days. The patient reported a previous injury to that shoulder that she believed that she aggravated.

## 2016-10-23 NOTE — ED Provider Notes (Signed)
[facility]    CSN: [id number] Arrival date & time: 10/23/16  1131     History   Chief Complaint Chief Complaint  Patient presents with  . Shoulder Pain    HPI Lydia [facility] is a 56 y.o. female.   The history is provided by the patient.  Shoulder Pain  Location:  Shoulder Shoulder location:  R shoulder Injury: no   Pain details:    Quality:  Sharp   Radiates to:  Does not radiate   Severity:  Moderate   Onset quality:  Sudden   Duration:  4 days   Progression:  Waxing and waning Handedness:  Right-handed Dislocation: no   Prior injury to area:  Yes (seen by ortho dr Zachery Dauerbarnes with therapy) Relieved by:  None tried Worsened by:  Nothing Ineffective treatments:  None tried Associated symptoms: decreased range of motion and stiffness   Associated symptoms: no neck pain     Past Medical History:  Diagnosis Date  . Asthma    Started as a child; Has been intubated for asthma exacerbation twice in approx 1990  . Shortness of breath    "related to asthma attacks" (09/19/2012)    Patient Active Problem List   Diagnosis Date Noted  . Shortness of breath 08/14/2016  . Insomnia 12/30/2012  . Vaginal discharge 02/09/2012  . Asthma with acute exacerbation 08/15/2011  . Asthma, chronic 11/02/2009  . Overweight(278.02) 12/20/2006  . DEPRESSION, MAJOR, RECURRENT 12/20/2006    Past Surgical History:  Procedure Laterality Date  . hand     flexor tendon repair  . TUBAL LIGATION  ~ 1985    OB History    No data available       Home Medications    Prior to Admission medications   Medication Sig Start Date End Date Taking? Authorizing Provider  albuterol (PROVENTIL HFA;VENTOLIN HFA) 108 (90 Base) MCG/ACT inhaler Inhale 2 puffs into the lungs every 4 (four) hours as needed for wheezing or shortness of breath (cough). every 4-6 hours for the next 24 08/07/16  Yes Wendee Beaversavid J McMullen, DO  Cyanocobalamin (VITAMIN B 12 PO) Place 1 tablet under the tongue daily.    Yes Historical Provider, MD  DULERA 200-5 MCG/ACT AERO Inhale 2 puffs into the lungs 2 (two) times daily. 08/07/16  Yes Wendee Beaversavid J McMullen, DO  loratadine (CLARITIN) 10 MG tablet Take 1 tablet (10 mg total) by mouth daily. 02/16/14  Yes Charlane FerrettiMelanie C Marsh, MD  Multiple Vitamin (MULITIVITAMIN WITH MINERALS) TABS Take 1 tablet by mouth daily.   Yes Historical Provider, MD  Probiotic Product (PRO-BIOTIC BLEND PO) Take 1 tablet by mouth daily.   Yes Historical Provider, MD    Family History Family History  Problem Relation Age of Onset  . Asthma Mother   . Heart disease Father [id number]    MI  . Alcohol abuse Father   . Asthma Sister 9    death from asthma    Social History Social History  Substance Use Topics  . Smoking status: Never Smoker  . Smokeless tobacco: Never Used  . Alcohol use No     Allergies   Patient has no known allergies.   Review of Systems Review of Systems  Musculoskeletal: Positive for stiffness. Negative for joint swelling, myalgias, neck pain and neck stiffness.  All other systems reviewed and are negative.    Physical Exam Triage Vital Signs ED Triage Vitals  Enc Vitals Group     BP 10/23/16 1218 121/75  Pulse Rate 10/23/16 1218 84     Resp 10/23/16 1218 18     Temp 10/23/16 1218 98.6 F (37 C)     Temp Source 10/23/16 1218 Oral     SpO2 10/23/16 1218 97 %     Weight --      Height --      Head Circumference --      Peak Flow --      Pain Score 10/23/16 1221 8     Pain Loc --      Pain Edu? --      Excl. in GC? --    No data found.   Updated Vital Signs BP 121/75 (BP Location: Left Arm)   Pulse 84   Temp 98.6 F (37 C) (Oral)   Resp 18   SpO2 97%   Visual Acuity Right Eye Distance:   Left Eye Distance:   Bilateral Distance:    Right Eye Near:   Left Eye Near:    Bilateral Near:     Physical Exam  Constitutional: She is oriented to person, place, and time. She appears well-developed and well-nourished. She appears distressed.    Neck: Normal range of motion. Neck supple.  Pulmonary/Chest: Effort normal and breath sounds normal.  Musculoskeletal: She exhibits tenderness. She exhibits no deformity.       Right shoulder: She exhibits decreased range of motion, tenderness, bony tenderness, swelling, effusion and pain. She exhibits normal pulse and normal strength.  Lymphadenopathy:    She has no cervical adenopathy.  Neurological: She is alert and oriented to person, place, and time.  Skin: Skin is warm and dry.  Nursing note and vitals reviewed.    UC Treatments / Results  Labs (all labs ordered are listed, but only abnormal results are displayed) Labs Reviewed - No data to display  EKG  EKG Interpretation None       Radiology No results found. X-rays reviewed and report per radiologist.  Procedures Procedures (including critical care time)  Medications Ordered in UC Medications - No data to display   Initial Impression / Assessment and Plan / UC Course  I have reviewed the triage vital signs and the nursing notes.  Pertinent labs & imaging results that were available during my care of the patient were reviewed by me and considered in my medical decision making (see chart for details).  Clinical Course      Final Clinical Impressions(s) / UC Diagnoses   Final diagnoses:  None    New Prescriptions New Prescriptions   No medications on file     Linna Hoff, MD 10/23/16 [phone]

## 2016-10-23 NOTE — Discharge Instructions (Signed)
Use medicine as prescribed and ice pack over cream, see dr Zachery Dauerbarnes for recheck.

## 2016-10-25 ENCOUNTER — Encounter: Payer: Self-pay | Admitting: Family Medicine

## 2016-10-25 ENCOUNTER — Ambulatory Visit (INDEPENDENT_AMBULATORY_CARE_PROVIDER_SITE_OTHER): Payer: 59 | Admitting: Family Medicine

## 2016-10-25 DIAGNOSIS — Z13220 Encounter for screening for lipoid disorders: Secondary | ICD-10-CM | POA: Diagnosis not present

## 2016-10-25 DIAGNOSIS — Z1159 Encounter for screening for other viral diseases: Secondary | ICD-10-CM | POA: Diagnosis not present

## 2016-10-25 DIAGNOSIS — J4531 Mild persistent asthma with (acute) exacerbation: Secondary | ICD-10-CM | POA: Diagnosis not present

## 2016-10-25 MED ORDER — AZITHROMYCIN 500 MG PO TABS: 500.0000 mg | ORAL_TABLET | Freq: Every day | ORAL | 0 refills | Status: DC

## 2016-10-25 MED ORDER — PREDNISONE 20 MG PO TABS: 40.0000 mg | ORAL_TABLET | Freq: Every day | ORAL | 0 refills | Status: DC

## 2016-10-25 NOTE — Patient Instructions (Signed)
I am glad you came today. I sent in prednisone and an antibiotic. I will call with lab test results. Please schedule a mammogram and colonoscopy.

## 2016-10-26 LAB — LIPID PANEL
Cholesterol: 165 mg/dL (ref <200)
HDL: 39 mg/dL — AB (ref >50)
LDL CALC: 109 mg/dL — AB (ref <100)
Total CHOL/HDL Ratio: 4.2 Ratio (ref <5.0)
Triglycerides: 83 mg/dL (ref <150)
VLDL: 17 mg/dL (ref <30)

## 2016-10-26 LAB — HEPATITIS C ANTIBODY: HCV AB: NEGATIVE

## 2016-10-27 NOTE — Assessment & Plan Note (Signed)
Will Rx with pred and azithro.

## 2016-10-27 NOTE — Assessment & Plan Note (Signed)
Screen.  Results OK.  Rx with life style modifications.

## 2016-10-27 NOTE — Progress Notes (Signed)
   Subjective:    Patient ID: Lydia [facility], female    DOB: [birth year], 56 y.o.   MRN: [id number]  HPI Patient with known asthma presents with a 5 day history of a respiratory illness.  Also had some nausea and diarrhea which have resolved.  No fever.  Positive cough and mild DOE.  Asthma has been well controled on current meds.  No recent hospitalizations.   Due for mammogram, colonoscopy and hep c screen.    Review of Systems     Objective:   Physical Exam Lungs Bilateral prolonged exp phase with mild end exp wheeze.       Assessment & Plan:

## 2016-10-27 NOTE — Assessment & Plan Note (Signed)
Screen.  Results neg.

## 2016-11-30 ENCOUNTER — Telehealth: Payer: Self-pay | Admitting: Student

## 2016-11-30 NOTE — Telephone Encounter (Signed)
Just na FYI, ok to leave samples?

## 2016-11-30 NOTE — Telephone Encounter (Signed)
Pt would like a samples of Dulera and Proventil. Pt doesn't have the money to refill Rx's at this time. ep

## 2016-12-01 NOTE — Telephone Encounter (Signed)
Samples (2) left up front with Elnita Maxwellheryl to pass along to patient.

## 2016-12-01 NOTE — Telephone Encounter (Signed)
Per dr Kennon Roundshaney, pt can get her dulera today. Pt was told to come into the office this afternoon to get this. Dr Kennon Roundshaney also wants her to schedule an appt. Pt was told this.

## 2017-01-26 ENCOUNTER — Ambulatory Visit (INDEPENDENT_AMBULATORY_CARE_PROVIDER_SITE_OTHER): Payer: 59 | Admitting: Family Medicine

## 2017-01-26 ENCOUNTER — Encounter: Payer: Self-pay | Admitting: Family Medicine

## 2017-01-26 VITALS — BP 136/80 | HR 71 | Temp 98.1°F | Ht 66.0 in | Wt 203.4 lb

## 2017-01-26 DIAGNOSIS — Z566 Other physical and mental strain related to work: Secondary | ICD-10-CM | POA: Diagnosis not present

## 2017-01-26 DIAGNOSIS — J4541 Moderate persistent asthma with (acute) exacerbation: Secondary | ICD-10-CM | POA: Diagnosis not present

## 2017-01-26 MED ORDER — ALBUTEROL SULFATE HFA 108 (90 BASE) MCG/ACT IN AERS: 2.0000 | INHALATION_SPRAY | RESPIRATORY_TRACT | 0 refills | Status: DC | PRN

## 2017-01-26 MED ORDER — FLUTICASONE FUROATE-VILANTEROL 100-25 MCG/INH IN AEPB: 1.0000 | INHALATION_SPRAY | Freq: Every day | RESPIRATORY_TRACT | 0 refills | Status: DC

## 2017-01-26 MED FILL — PROVENTIL HFA 90 MCG INH: 108 (90 BAS | 25 days supply | Qty: 7 | Fill #0

## 2017-01-26 NOTE — Patient Instructions (Signed)
I recommend that you use your Albuterol inhaler every 4 hours for the next 24 hours.  Follow up with Dr Kennon Rounds in 2 weeks.  If you need additional samples of Breo, she may be able to provide this to you until you get Dulera.  Asthma, Adult Asthma is a condition of the lungs in which the airways tighten and narrow. Asthma can make it hard to breathe. Asthma cannot be cured, but medicine and lifestyle changes can help control it. Asthma may be started (triggered) by:  Animal skin flakes (dander).  Dust.  Cockroaches.  Pollen.  Mold.  Smoke.  Cleaning products.  Hair sprays or aerosol sprays.  Paint fumes or strong smells.  Cold air, weather changes, and winds.  Crying or laughing hard.  Stress.  Certain medicines or drugs.  Foods, such as dried fruit, potato chips, and sparkling grape juice.  Infections or conditions (colds, flu).  Exercise.  Certain medical conditions or diseases.  Exercise or tiring activities. Follow these instructions at home:  Take medicine as told by your doctor.  Use a peak flow meter as told by your doctor. A peak flow meter is a tool that measures how well the lungs are working.  Record and keep track of the peak flow meter's readings.  Understand and use the asthma action plan. An asthma action plan is a written plan for taking care of your asthma and treating your attacks.  To help prevent asthma attacks:  Do not smoke. Stay away from secondhand smoke.  Change your heating and air conditioning filter often.  Limit your use of fireplaces and wood stoves.  Get rid of pests (such as roaches and mice) and their droppings.  Throw away plants if you see mold on them.  Clean your floors. Dust regularly. Use cleaning products that do not smell.  Have someone vacuum when you are not home. Use a vacuum cleaner with a HEPA filter if possible.  Replace carpet with wood, tile, or vinyl flooring. Carpet can trap animal skin flakes and  dust.  Use allergy-proof pillows, mattress covers, and box spring covers.  Wash bed sheets and blankets every week in hot water and dry them in a dryer.  Use blankets that are made of polyester or cotton.  Clean bathrooms and kitchens with bleach. If possible, have someone repaint the walls in these rooms with mold-resistant paint. Keep out of the rooms that are being cleaned and painted.  Wash hands often. Contact a doctor if:  You have make a whistling sound when breaking (wheeze), have shortness of breath, or have a cough even if taking medicine to prevent attacks.  The colored mucus you cough up (sputum) is thicker than usual.  The colored mucus you cough up changes from clear or white to yellow, green, gray, or bloody.  You have problems from the medicine you are taking such as:  A rash.  Itching.  Swelling.  Trouble breathing.  You need reliever medicines more than 2-3 times a week.  Your peak flow measurement is still at 50-79% of your personal best after following the action plan for 1 hour.  You have a fever. Get help right away if:  You seem to be worse and are not responding to medicine during an asthma attack.  You are short of breath even at rest.  You get short of breath when doing very little activity.  You have trouble eating, drinking, or talking.  You have chest pain.  You have a fast  heartbeat.  Your lips or fingernails start to turn blue.  You are light-headed, dizzy, or faint.  Your peak flow is less than 50% of your personal best. This information is not intended to replace advice given to you by your health care provider. Make sure you discuss any questions you have with your health care provider. Document Released: 03/27/2008 Document Revised: 03/16/2016 Document Reviewed: 05/08/2013 Elsevier Interactive Patient Education  2017 [employer].

## 2017-01-26 NOTE — Progress Notes (Signed)
   Subjective: ZO:XWRUEA VWU:[id number] O Lydia Griffin is a 56 y.o. female presenting to clinic today for same day appointment. PCP: Velora Heckler, MD Concerns today include:  1. Asthma Patient reports increased wheezing over last few days.  She is working with insurance to try and get [facility] covered.  Last dose of Dulera was this morning.  She reports that she is out of her Albuterol and cannot afford another inhaler.  She reports shortness of breath with activity.  She denies vomiting, chest pain.  She does not rib pain and abdominal muscle pain after coughing spells.  Phlegm is clear.  No fevers.  She reports that she is eating and drinking ok.  2. Stress Patient reports that she is in between jobs, which is causing a lot of stress.  She reports that her water was turned off for a brief period of time.  She has groceries.  No Known Allergies  Social Hx reviewed: never smoker. MedHx, current medications and allergies reviewed.  Please see EMR. ROS: Per HPI  Objective: Office vital signs reviewed. BP 136/80 (BP Location: Left Arm, Patient Position: Sitting, Cuff Size: Normal)   Pulse 71   Temp 98.1 F (36.7 C) (Oral)   Ht  (1.676 m)   Wt 203 lb 6.4 oz (92.3 kg)   SpO2 95%   BMI 32.83 kg/m   Physical Examination:  General: Awake, alert, well nourished, No acute distress HEENT: Normal, MMM Cardio: regular rate and rhythm, S1S2 heard, no murmurs appreciated Pulm: global expiratory and inspiratory wheezes, no rhonchi or rales; normal work of breathing on room air, good air movement Psych: mood stable, speech normal, good eye contact  Assessment/ Plan: 56 y.o. female   1. Moderate persistent asthma with acute exacerbation - Patient unable to afford Dulera/ Albuterol, as she is in between jobs.  Breo ellipta sample provided today.  Reviewed use.  Albuterol inhaler sent to [facility] pharmacy. Meds ordered this encounter  Medications  . albuterol (PROVENTIL HFA;VENTOLIN HFA) 108 (90 Base)  MCG/ACT inhaler    Sig: Inhale 2 puffs into the lungs every 4 (four) hours as needed for wheezing or shortness of breath (cough).    Dispense:  1 Inhaler    Refill:  0    Charge to [facility]  . fluticasone furoate-vilanterol (BREO ELLIPTA) 100-25 MCG/INH AEPB    Sig: Inhale 1 puff into the lungs daily.    Dispense:  1 each    Refill:  0    NDC 7829-5621-30 Lot [id number] Exp 01/2018  - Return precautions reviewed  2. Stress at work - Having financial stress - Offered CSW services.  Will cc note to [name], CSW, to see if there are any resources we can offer her.  Follow up in 2 weeks with PCP.  Raliegh Ip, DO PGY-3, [facility]

## 2017-01-29 ENCOUNTER — Ambulatory Visit: Payer: Self-pay | Admitting: Family Medicine

## 2017-01-29 ENCOUNTER — Ambulatory Visit (INDEPENDENT_AMBULATORY_CARE_PROVIDER_SITE_OTHER): Payer: 59 | Admitting: Family Medicine

## 2017-01-29 ENCOUNTER — Encounter: Payer: Self-pay | Admitting: Family Medicine

## 2017-01-29 VITALS — BP 124/64 | HR 89 | Temp 98.1°F | Ht 66.0 in | Wt 203.0 lb

## 2017-01-29 DIAGNOSIS — R0689 Other abnormalities of breathing: Secondary | ICD-10-CM | POA: Diagnosis not present

## 2017-01-29 DIAGNOSIS — J4531 Mild persistent asthma with (acute) exacerbation: Secondary | ICD-10-CM

## 2017-01-29 MED ORDER — METHYLPREDNISOLONE SODIUM SUCC 125 MG IJ SOLR
125.0000 mg | Freq: Once | INTRAMUSCULAR | Status: AC
  Administered 2017-01-29: 125 mg via INTRAMUSCULAR

## 2017-01-29 MED ORDER — PREDNISONE 10 MG PO TABS: 50.0000 mg | ORAL_TABLET | Freq: Every day | ORAL | 0 refills | Status: DC

## 2017-01-29 NOTE — Assessment & Plan Note (Signed)
s/p solumedrol in office today-5 days of prednisone for burst. Warning signs and return symptoms reviewed.

## 2017-01-29 NOTE — Patient Instructions (Signed)
Asthma, Acute Bronchospasm °Acute bronchospasm caused by asthma is also referred to as an asthma attack. Bronchospasm means your air passages become narrowed. The narrowing is caused by inflammation and tightening of the muscles in the air tubes (bronchi) in your lungs. This can make it hard to breathe or cause you to wheeze and cough. °What are the causes? °Possible triggers are: °· Animal dander from the skin, hair, or feathers of animals. °· Dust mites contained in house dust. °· Cockroaches. °· Pollen from trees or grass. °· Mold. °· Cigarette or tobacco smoke. °· Air pollutants such as dust, household cleaners, hair sprays, aerosol sprays, paint fumes, strong chemicals, or strong odors. °· Cold air or weather changes. Cold air may trigger inflammation. Winds increase molds and pollens in the air. °· Strong emotions such as crying or laughing hard. °· Stress. °· Certain medicines such as aspirin or beta-blockers. °· Sulfites in foods and drinks, such as dried fruits and wine. °· Infections or inflammatory conditions, such as a flu, cold, or inflammation of the nasal membranes (rhinitis). °· Gastroesophageal reflux disease (GERD). GERD is a condition where stomach acid backs up into your esophagus. °· Exercise or strenuous activity. ° °What are the signs or symptoms? °· Wheezing. °· Excessive coughing, particularly at night. °· Chest tightness. °· Shortness of breath. °How is this diagnosed? °Your health care provider will ask you about your medical history and perform a physical exam. A chest X-ray or blood testing may be performed to look for other causes of your symptoms or other conditions that may have triggered your asthma attack. °How is this treated? °Treatment is aimed at reducing inflammation and opening up the airways in your lungs. Most asthma attacks are treated with inhaled medicines. These include quick relief or rescue medicines (such as bronchodilators) and controller medicines (such as inhaled  corticosteroids). These medicines are sometimes given through an inhaler or a nebulizer. Systemic steroid medicine taken by mouth or given through an IV tube also can be used to reduce the inflammation when an attack is moderate or severe. Antibiotic medicines are only used if a bacterial infection is present. °Follow these instructions at home: °· Rest. °· Drink plenty of liquids. This helps the mucus to remain thin and be easily coughed up. Only use caffeine in moderation and do not use alcohol until you have recovered from your illness. °· Do not smoke. Avoid being exposed to secondhand smoke. °· You play a critical role in keeping yourself in good health. Avoid exposure to things that cause you to wheeze or to have breathing problems. °· Keep your medicines up-to-date and available. Carefully follow your health care provider’s treatment plan. °· Take your medicine exactly as prescribed. °· When pollen or pollution is bad, keep windows closed and use an air conditioner or go to places with air conditioning. °· Asthma requires careful medical care. See your health care provider for a follow-up as advised. If you are more than [redacted] weeks pregnant and you were prescribed any new medicines, let your obstetrician know about the visit and how you are doing. Follow up with your health care provider as directed. °· After you have recovered from your asthma attack, make an appointment with your outpatient doctor to talk about ways to reduce the likelihood of future attacks. If you do not have a doctor who manages your asthma, make an appointment with a primary care doctor to discuss your asthma. °Get help right away if: °· You are getting worse. °·   You have trouble breathing. If severe, call your local emergency services (911 in the [place] °· You develop chest pain or discomfort. °· You are vomiting. °· You are not able to keep fluids down. °· You are coughing up yellow, green, brown, or bloody sputum. °· You have a fever  and your symptoms suddenly get worse. °· You have trouble swallowing. °This information is not intended to replace advice given to you by your health care provider. Make sure you discuss any questions you have with your health care provider. °Document Released: 01/24/2007 Document Revised: 03/22/2016 Document Reviewed: 04/16/2013 °Elsevier Interactive Patient Education © 2017 Elsevier Inc. ° °

## 2017-01-29 NOTE — Progress Notes (Signed)
   Subjective:    Patient ID: Lydia Griffin is a 56 y.o. female presenting with Asthma  on 01/29/2017  HPI: Seen here 2 days ago and started on Breo. Notes that she is losing weight and walking but cannot now due to abnormal breathing. Waking up at night. Usually has issues during pollen season. Albuterol is needed q 1-2 hours. States she is between jobs and waiting on a settlement from her job, but she is without an income. States she is feeling stress from this.  Review of Systems  Constitutional: Negative for chills and fever.  Respiratory: Negative for shortness of breath.   Cardiovascular: Negative for chest pain.  Gastrointestinal: Negative for abdominal pain, nausea and vomiting.  Genitourinary: Negative for dysuria.  Skin: Negative for rash.      Objective:    BP 124/64   Pulse 89   Temp 98.1 F (36.7 C) (Oral)   Ht  (1.676 m)   Wt 203 lb (92.1 kg)   SpO2 99%   BMI 32.77 kg/m  Physical Exam  Constitutional: She is oriented to person, place, and time. She appears well-developed and well-nourished. No distress.  HENT:  Head: Normocephalic and atraumatic.  Eyes: No scleral icterus.  Neck: Neck supple.  Cardiovascular: Normal rate.   Pulmonary/Chest: Effort normal. She has wheezes (diffuse expiratory).  Abdominal: Soft.  Neurological: She is alert and oriented to person, place, and time.  Skin: Skin is warm and dry.  Psychiatric: She has a normal mood and affect.        Assessment & Plan:   Problem List Items Addressed This Visit      Unprioritized   Asthma with acute exacerbation    s/p solumedrol in office today-5 days of prednisone for burst. Warning signs and return symptoms reviewed.      Relevant Medications   predniSONE (DELTASONE) 10 MG tablet   methylPREDNISolone sodium succinate (SOLU-MEDROL) 125 mg/2 mL injection 125 mg (Completed)    Other Visit Diagnoses    Difficulty breathing    -  Primary   Relevant Medications   methylPREDNISolone  sodium succinate (SOLU-MEDROL) 125 mg/2 mL injection 125 mg (Completed)      Total face-to-face time with patient: 15 minutes. Over 50% of encounter was spent on counseling and coordination of care.  Reva Bores 01/29/2017 3:00 PM

## 2017-01-30 ENCOUNTER — Other Ambulatory Visit: Payer: 59

## 2017-02-03 ENCOUNTER — Telehealth: Payer: Self-pay | Admitting: Internal Medicine

## 2017-02-03 NOTE — Telephone Encounter (Signed)
Received page to Emergency after-hours line. Patient was prescribed a prednisone burst for asthma exacerbation last week but called a couple pharmacies and no prescription available. Checked records and let her know Rx was sent to [facility]. She said that sounded familiar and thinks Dr. Shawnie Pons ordered it there for cost reasons. Asked if she would like me to send Rx to a different pharmacy. She declined, saying she was doing well on her inhalers and s/p steroid injection 4/9 and would pick up Rx on Monday if she thought she still needed it.  Dani Gobble, MD [facility], PGY-2

## 2017-02-05 MED FILL — predniSONE 10 MG TABS: 10 | 5 days supply | Qty: 25 | Fill #0

## 2017-02-07 ENCOUNTER — Encounter: Payer: Self-pay | Admitting: Student

## 2017-02-07 ENCOUNTER — Ambulatory Visit (INDEPENDENT_AMBULATORY_CARE_PROVIDER_SITE_OTHER): Payer: 59 | Admitting: Student

## 2017-02-07 DIAGNOSIS — Z0000 Encounter for general adult medical examination without abnormal findings: Secondary | ICD-10-CM

## 2017-02-07 DIAGNOSIS — J45909 Unspecified asthma, uncomplicated: Secondary | ICD-10-CM

## 2017-02-07 NOTE — Assessment & Plan Note (Signed)
Mild asthma exacerbation which would be best treated by steroids. She has a dose of decadron last week in clinic which helped some. She is overall comfortable in clinic with normal vitals, does have mild wheezing on exam - she will take prednisone as able - follow up as needed

## 2017-02-07 NOTE — Patient Instructions (Signed)
Take prednisone as prescribed If your breathing gets much worse, go to the ED Please obtain a mammogram and colonoscopy  If you have questions or concerns, call th e office at [phone]
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
## 2017-02-07 NOTE — Assessment & Plan Note (Signed)
Mammogram and colonoscopy information given. Will follow for TDap

## 2017-02-14 ENCOUNTER — Other Ambulatory Visit: Payer: Self-pay | Admitting: Student

## 2017-02-14 DIAGNOSIS — J4541 Moderate persistent asthma with (acute) exacerbation: Secondary | ICD-10-CM

## 2017-02-14 NOTE — Telephone Encounter (Signed)
Pt states she is out of the [place] and would like to get some samples of Breo or Dulera. I spoke with Renard Hamper and we are out of both. Please advise. ep

## 2017-02-14 NOTE — Telephone Encounter (Signed)
Will forward to MD to write a script for the inhaler patient is supposed to be taking and she can use good rx for the copay. Jazmin Hartsell,CMA

## 2017-02-16 MED ORDER — DULERA 200-5 MCG/ACT IN AERO: 2.0000 | INHALATION_SPRAY | Freq: Two times a day (BID) | RESPIRATORY_TRACT | 1 refills | Status: DC

## 2017-02-27 NOTE — Telephone Encounter (Signed)
Will forward to MD. Jazmin Hartsell,CMA  

## 2017-02-27 NOTE — Telephone Encounter (Signed)
Pt states she was able to get the Proventil, but is still wheezing in the morning. Pt states paper work was filled out a long time and sent to [employer] of Dulera, but pt has never heard back. Pt [facility] and wants PCP to call her. ep

## 2017-02-28 NOTE — Telephone Encounter (Signed)
Please ask patient to come to the office to be seen if she feels she is unwell. She was told the office no longer had dulera samples when she was last here and was given an alternative. Additionally, she did not request any paperwork to be submitted for dulera. If she does not have the orange card, she could benefit from this.

## 2017-02-28 NOTE — Telephone Encounter (Signed)
Pt states the company never received the paperwork from us, pt was crying and very upset. Pt states she does not feel well. ep

## 2017-02-28 NOTE — Telephone Encounter (Signed)
Attempted to contact pt to discuss, however, pt did not answer. I spoke with PCP and we do have breo samples, I left on pts VM I will leave some up front for her. On vm I also instructed pt to go to urgent care if she is not feeling well, [facility]. I will leave Breo samples up front for pt.

## 2017-02-28 NOTE — Telephone Encounter (Signed)
Per pcp pt should make an apt to discuss paperwork, per pcp, current pcp did not fill out the previous forms. PCP has only seen pt one time for asthma and dulera paperwork was not discussed. Pt needs to schedule an apt with pcp asap do discuss paperwork.

## 2017-02-28 NOTE — Telephone Encounter (Signed)
Pt called back about the dulera.  She says the mfg has the paperwork.  She cannot afford dulera.  She still is using proventila inhaler.  She has no insurance now.  Pt is still wheezing.  She would like to get dulera samples. Please advise

## 2017-03-13 ENCOUNTER — Other Ambulatory Visit: Payer: Self-pay | Admitting: Student

## 2017-03-13 DIAGNOSIS — J4541 Moderate persistent asthma with (acute) exacerbation: Secondary | ICD-10-CM

## 2017-03-13 NOTE — Telephone Encounter (Signed)
Pt would like a refill on her Proventil called in. jw

## 2017-03-14 MED ORDER — ALBUTEROL SULFATE HFA 108 (90 BASE) MCG/ACT IN AERS: 2.0000 | INHALATION_SPRAY | RESPIRATORY_TRACT | 3 refills | Status: DC | PRN

## 2017-03-14 MED FILL — VENTOLIN HFA 90 MCG INHALER: 108 (90 BAS | 17 days supply | Qty: 18 | Fill #0

## 2017-03-28 MED FILL — VENTOLIN HFA 90 MCG INHALER: 108 (90 BAS | 17 days supply | Qty: 18 | Fill #1

## 2017-04-06 ENCOUNTER — Ambulatory Visit (INDEPENDENT_AMBULATORY_CARE_PROVIDER_SITE_OTHER): Payer: Self-pay | Admitting: Family Medicine

## 2017-04-06 ENCOUNTER — Encounter: Payer: Self-pay | Admitting: Family Medicine

## 2017-04-06 VITALS — BP 126/68 | HR 72 | Temp 98.0°F | Ht 66.0 in | Wt 198.4 lb

## 2017-04-06 DIAGNOSIS — J4541 Moderate persistent asthma with (acute) exacerbation: Secondary | ICD-10-CM

## 2017-04-06 DIAGNOSIS — J4531 Mild persistent asthma with (acute) exacerbation: Secondary | ICD-10-CM

## 2017-04-06 MED ORDER — PREDNISONE 50 MG PO TABS: 50.0000 mg | ORAL_TABLET | Freq: Every day | ORAL | 0 refills | Status: DC

## 2017-04-06 MED ORDER — FLUTICASONE FUROATE-VILANTEROL 100-25 MCG/INH IN AEPB: 1.0000 | INHALATION_SPRAY | Freq: Every day | RESPIRATORY_TRACT | 0 refills | Status: DC

## 2017-04-06 MED ORDER — ALBUTEROL SULFATE (2.5 MG/3ML) 0.083% IN NEBU
2.5000 mg | INHALATION_SOLUTION | Freq: Once | RESPIRATORY_TRACT | Status: AC
  Administered 2017-04-06: 2.5 mg via RESPIRATORY_TRACT

## 2017-04-06 MED ORDER — ALBUTEROL SULFATE HFA 108 (90 BASE) MCG/ACT IN AERS: 2.0000 | INHALATION_SPRAY | RESPIRATORY_TRACT | 3 refills | Status: DC | PRN

## 2017-04-06 MED ORDER — IPRATROPIUM BROMIDE 0.02 % IN SOLN
0.5000 mg | Freq: Once | RESPIRATORY_TRACT | Status: AC
  Administered 2017-04-06: 0.5 mg via RESPIRATORY_TRACT

## 2017-04-06 MED FILL — VENTOLIN HFA 90 MCG INHALER: 108 (90 BAS | 17 days supply | Qty: 18 | Fill #0

## 2017-04-06 NOTE — Patient Instructions (Signed)
  It was good meeting you! Sorry your asthma is giving you such a hard time.   Please use the Breo inhaler once daily and use your albuterol inhaler as needed.  Please [facility] to get [facility]!   If you have questions or concerns please do not hesitate to call at [phone].   Dolores PattyAngela Fatime Biswell, DO PGY-1, [place] [facility] 04/06/2017 3:12 PM

## 2017-04-06 NOTE — Assessment & Plan Note (Addendum)
  Duoneb given in the office with improvement in wheezing.   -rx given for 5 day burst of 50 mg prednisone, sent to [facility] pharmacy with goodRx coupon so [facility] which patient states she can afford -al[facility]erol inhal[facility], [facility] -two samples of Breo inhalers given to patient, this will last 4 weeks -emphasized importance of patient getting orange card to get assistance affording medications in future -follow up in 1 month

## 2017-04-06 NOTE — Progress Notes (Signed)
Subjective:    Patient ID: Lydia Griffin, female    DOB: [birth year], 56 y.o.   MRN: [id number]  CC: SOB  Has asthma, was seen a couple of weeks ago and given steroids. Since then has used 2 albuterol inhalers. Is using the inhaler every hour. No insurance, cannot afford controller med. Trying to get orange card but has paperwork she still needs to get financial documents in order. She reports feeling slightly short of breath. Inhaler helps for a short while. Denies CP.  Smoking status reviewed- non-smoker  Review of Systems- see HPI  Objective:  BP 126/68   Pulse 72   Temp 98 F (36.7 C) (Oral)   Ht 5\' 6"  (1.676 m)   Wt 198 lb 6.4 oz (90 kg)   SpO2 94%   BMI 32.02 kg/m  Vitals and nursing note reviewed  General: well nourished, in no acute distress Cardiac: RRR, clear S1 and S2, no murmurs, rubs, or gallops Respiratory: diffuse wheezes bilaterally, no increased work of breathing Skin: warm and dry, no rashes noted Neuro: alert and oriented, no focal deficits  Assessment & Plan:    Asthma with acute exacerbation  Duoneb given in the office with improvement in wheezing.  -rx given for 5 day burst of 50 mg prednisone, sent to [facility] pharmacy with goodRx coupon so this will cost 4 dollars which patient states she can afford -albuterol inhaler refilled and sent to [facility], this will be billed to [facility] indigent fund -two samples of Breo inhalers given to patient, this will last 4 weeks -emphasized importance of patient getting orange card to get assistance affording medications in future -follow up in 1 month  Return in about 4 weeks (around 05/04/2017).  Dolores Patty, DO Family Medicine Resident PGY-1

## 2017-04-27 MED FILL — VENTOLIN HFA 90 MCG INHALER: 108 (90 BAS | 17 days supply | Qty: 18 | Fill #1

## 2017-05-02 ENCOUNTER — Emergency Department (HOSPITAL_COMMUNITY): Payer: Self-pay

## 2017-05-02 ENCOUNTER — Encounter (HOSPITAL_COMMUNITY): Payer: Self-pay | Admitting: Emergency Medicine

## 2017-05-02 ENCOUNTER — Observation Stay (HOSPITAL_COMMUNITY)
Admission: EM | Admit: 2017-05-02 | Discharge: 2017-05-03 | Disposition: A | Discharge status: Home / Self Care | Payer: Self-pay | Attending: Family Medicine | Admitting: Family Medicine

## 2017-05-02 DIAGNOSIS — J45901 Unspecified asthma with (acute) exacerbation: Secondary | ICD-10-CM

## 2017-05-02 DIAGNOSIS — R0602 Shortness of breath: Secondary | ICD-10-CM

## 2017-05-02 DIAGNOSIS — R110 Nausea: Secondary | ICD-10-CM | POA: Insufficient documentation

## 2017-05-02 DIAGNOSIS — Z79899 Other long term (current) drug therapy: Secondary | ICD-10-CM | POA: Insufficient documentation

## 2017-05-02 DIAGNOSIS — R5383 Other fatigue: Secondary | ICD-10-CM | POA: Insufficient documentation

## 2017-05-02 DIAGNOSIS — Z7951 Long term (current) use of inhaled steroids: Secondary | ICD-10-CM | POA: Insufficient documentation

## 2017-05-02 DIAGNOSIS — E876 Hypokalemia: Secondary | ICD-10-CM | POA: Insufficient documentation

## 2017-05-02 DIAGNOSIS — Z7952 Long term (current) use of systemic steroids: Secondary | ICD-10-CM | POA: Insufficient documentation

## 2017-05-02 HISTORY — DX: Unspecified asthma with (acute) exacerbation: J45.901

## 2017-05-02 LAB — BASIC METABOLIC PANEL
ANION GAP: 7 (ref 5–15)
BUN: 6 mg/dL (ref 6–20)
CHLORIDE: 106 mmol/L (ref 101–111)
CO2: 23 mmol/L (ref 22–32)
Calcium: 8.8 mg/dL — ABNORMAL LOW (ref 8.9–10.3)
Creatinine, Ser: 0.79 mg/dL (ref 0.44–1.00)
GFR calc Af Amer: >60 mL/min (ref >60)
GFR calc non Af Amer: >60 mL/min (ref >60)
Glucose, Bld: 99 mg/dL (ref 65–99)
POTASSIUM: 3.2 mmol/L — AB (ref 3.5–5.1)
SODIUM: 136 mmol/L (ref 135–145)

## 2017-05-02 LAB — CBC WITH DIFFERENTIAL/PLATELET
BASOS ABS: 0 10*3/uL (ref 0.0–0.1)
BASOS PCT: 0 %
Eosinophils Absolute: 0.4 10*3/uL (ref 0.0–0.7)
Eosinophils Relative: 7 %
HEMATOCRIT: 37.8 % (ref 36.0–46.0)
HEMOGLOBIN: 12.4 g/dL (ref 12.0–15.0)
Lymphocytes Relative: 42 %
Lymphs Abs: 2.3 10*3/uL (ref 0.7–4.0)
MCH: 27 pg (ref 26.0–34.0)
MCHC: 32.8 g/dL (ref 30.0–36.0)
MCV: 82.2 fL (ref 78.0–100.0)
MONOS PCT: 10 %
Monocytes Absolute: 0.5 10*3/uL (ref 0.1–1.0)
NEUTROS ABS: 2.3 10*3/uL (ref 1.7–7.7)
NEUTROS PCT: 41 %
Platelets: 338 10*3/uL (ref 150–400)
RBC: 4.6 MIL/uL (ref 3.87–5.11)
RDW: 14.5 % (ref 11.5–15.5)
WBC: 5.5 10*3/uL (ref 4.0–10.5)

## 2017-05-02 LAB — GLUCOSE, CAPILLARY: GLUCOSE-CAPILLARY: 150 mg/dL — AB (ref 65–99)

## 2017-05-02 LAB — MRSA PCR SCREENING: MRSA by PCR: NEGATIVE

## 2017-05-02 MED ORDER — METHYLPREDNISOLONE SODIUM SUCC 125 MG IJ SOLR
125.0000 mg | Freq: Once | INTRAMUSCULAR | Status: AC
  Administered 2017-05-02: 125 mg via INTRAVENOUS
  Filled 2017-05-02: qty 2

## 2017-05-02 MED ORDER — ALBUTEROL SULFATE (2.5 MG/3ML) 0.083% IN NEBU
5.0000 mg | INHALATION_SOLUTION | Freq: Once | RESPIRATORY_TRACT | Status: AC
  Administered 2017-05-02: 5 mg via RESPIRATORY_TRACT
  Filled 2017-05-02: qty 6

## 2017-05-02 MED ORDER — ONDANSETRON HCL 4 MG/2ML IJ SOLN: 4.0000 mg | Freq: Three times a day (TID) | INTRAMUSCULAR | Status: DC | PRN

## 2017-05-02 MED ORDER — PREDNISONE 20 MG PO TABS
50.0000 mg | ORAL_TABLET | Freq: Every day | ORAL | Status: DC
  Administered 2017-05-03: 50 mg via ORAL
  Filled 2017-05-02: qty 3

## 2017-05-02 MED ORDER — MOMETASONE FURO-FORMOTEROL FUM 200-5 MCG/ACT IN AERO
2.0000 | INHALATION_SPRAY | Freq: Two times a day (BID) | RESPIRATORY_TRACT | Status: DC
  Administered 2017-05-02 – 2017-05-03 (×2): 2 via RESPIRATORY_TRACT
  Filled 2017-05-02: qty 8.8

## 2017-05-02 MED ORDER — IPRATROPIUM-ALBUTEROL 0.5-2.5 (3) MG/3ML IN SOLN
3.0000 mL | Freq: Once | RESPIRATORY_TRACT | Status: AC
  Administered 2017-05-02: 3 mL via RESPIRATORY_TRACT

## 2017-05-02 MED ORDER — IPRATROPIUM-ALBUTEROL 0.5-2.5 (3) MG/3ML IN SOLN
RESPIRATORY_TRACT | Status: AC
  Filled 2017-05-02: qty 3

## 2017-05-02 MED ORDER — ALBUTEROL SULFATE HFA 108 (90 BASE) MCG/ACT IN AERS: 2.0000 | INHALATION_SPRAY | RESPIRATORY_TRACT | Status: DC | PRN

## 2017-05-02 MED ORDER — IPRATROPIUM BROMIDE 0.02 % IN SOLN
0.5000 mg | Freq: Once | RESPIRATORY_TRACT | Status: AC
  Administered 2017-05-02: 0.5 mg via RESPIRATORY_TRACT

## 2017-05-02 MED ORDER — MONTELUKAST SODIUM 10 MG PO TABS
10.0000 mg | ORAL_TABLET | Freq: Every day | ORAL | Status: DC
  Administered 2017-05-02: 10 mg via ORAL
  Filled 2017-05-02: qty 1

## 2017-05-02 MED ORDER — IPRATROPIUM-ALBUTEROL 0.5-2.5 (3) MG/3ML IN SOLN
3.0000 mL | Freq: Four times a day (QID) | RESPIRATORY_TRACT | Status: DC
  Administered 2017-05-02: 3 mL via RESPIRATORY_TRACT
  Filled 2017-05-02: qty 3

## 2017-05-02 MED ORDER — IPRATROPIUM BROMIDE 0.02 % IN SOLN
0.5000 mg | Freq: Once | RESPIRATORY_TRACT | Status: AC
  Administered 2017-05-02: 0.5 mg via RESPIRATORY_TRACT
  Filled 2017-05-02: qty 2.5

## 2017-05-02 MED ORDER — IPRATROPIUM-ALBUTEROL 0.5-2.5 (3) MG/3ML IN SOLN
3.0000 mL | RESPIRATORY_TRACT | Status: DC
  Administered 2017-05-02 (×2): 3 mL via RESPIRATORY_TRACT
  Filled 2017-05-02 (×2): qty 3

## 2017-05-02 MED ORDER — ALBUTEROL (5 MG/ML) CONTINUOUS INHALATION SOLN
10.0000 mg/h | INHALATION_SOLUTION | RESPIRATORY_TRACT | Status: AC
  Administered 2017-05-02: 10 mg/h via RESPIRATORY_TRACT
  Filled 2017-05-02: qty 20

## 2017-05-02 MED ORDER — ALBUTEROL SULFATE (2.5 MG/3ML) 0.083% IN NEBU: 5.0000 mg | INHALATION_SOLUTION | Freq: Once | RESPIRATORY_TRACT | Status: DC

## 2017-05-02 MED ORDER — ENOXAPARIN SODIUM 40 MG/0.4ML ~~LOC~~ SOLN
40.0000 mg | SUBCUTANEOUS | Status: DC
  Administered 2017-05-02: 40 mg via SUBCUTANEOUS
  Filled 2017-05-02: qty 0.4

## 2017-05-02 MED ORDER — IPRATROPIUM-ALBUTEROL 0.5-2.5 (3) MG/3ML IN SOLN: 3.0000 mL | RESPIRATORY_TRACT | Status: DC | PRN

## 2017-05-02 MED ORDER — POTASSIUM CHLORIDE CRYS ER 20 MEQ PO TBCR
40.0000 meq | EXTENDED_RELEASE_TABLET | Freq: Once | ORAL | Status: AC
  Administered 2017-05-02: 40 meq via ORAL
  Filled 2017-05-02: qty 2

## 2017-05-02 MED ORDER — ACETAMINOPHEN 325 MG PO TABS
650.0000 mg | ORAL_TABLET | Freq: Four times a day (QID) | ORAL | Status: DC | PRN
  Administered 2017-05-02: 650 mg via ORAL
  Filled 2017-05-02: qty 2

## 2017-05-02 MED ORDER — HYDROXYZINE HCL 25 MG PO TABS
25.0000 mg | ORAL_TABLET | Freq: Every day | ORAL | Status: DC
  Administered 2017-05-02 – 2017-05-03 (×2): 25 mg via ORAL
  Filled 2017-05-02 (×2): qty 1

## 2017-05-02 MED ORDER — ONDANSETRON 4 MG PO TBDP: 4.0000 mg | ORAL_TABLET | Freq: Three times a day (TID) | ORAL | Status: DC | PRN

## 2017-05-02 MED ORDER — MAGNESIUM SULFATE 2 GM/50ML IV SOLN
2.0000 g | Freq: Once | INTRAVENOUS | Status: AC
  Administered 2017-05-02: 2 g via INTRAVENOUS
  Filled 2017-05-02: qty 50

## 2017-05-02 NOTE — ED Provider Notes (Signed)
[facility]-EMERGENCY DEPT Provider Note   CSN: [id number] Arrival date & time: 05/02/17  0301     History   Chief Complaint Chief Complaint  Patient presents with  . Asthma    HPI Lydia Griffin is a 56 y.o. female with a hx of Asthma presents to the Emergency Department complaining of gradual, persistent, progressively worsening wheezing and shortness of breath onset just over one week ago but significantly worsening in the last 24 hours. Patient reports increased wheezing, chest tightness and chest pain throughout the weekend. She reports that within the last 24 hours her nebulizers and rescue inhalers have not helped at all. She reports she was recently taking Dulera but was switched from this due to financial reasons. She reports the new long-acting beta agonist (Breo) has not been helping her symptoms.  She ran out of this medication 2 days ago. Associated symptoms include cough but denies fevers or chills. Exertion makes her symptoms worse. Patient denies known sick contacts. She reports she has been taking her Claritin as directed.  Patient reports last hospitalization was one year ago. She reports previous intubation during her teenage years.    The history is provided by the patient and medical records. No language interpreter was used.    Past Medical History:  Diagnosis Date  . Asthma    Started as a child; Has been intubated for asthma exacerbation twice in approx 1990  . Shortness of breath    "related to asthma attacks" (09/19/2012)    Patient Active Problem List   Diagnosis Date Noted  . Healthcare maintenance 02/07/2017  . Screening cholesterol level 10/25/2016  . Insomnia 12/30/2012  . Vaginal discharge 02/09/2012  . Asthma with acute exacerbation 08/15/2011  . Asthma, chronic 11/02/2009  . Overweight(278.02) 12/20/2006  . DEPRESSION, MAJOR, RECURRENT 12/20/2006    Past Surgical History:  Procedure Laterality Date  . hand     flexor tendon repair  . TUBAL  LIGATION  ~ 1985    OB History    No data available       Home Medications    Prior to Admission medications   Medication Sig Start Date End Date Taking? Authorizing Provider  albuterol (PROVENTIL HFA;VENTOLIN HFA) 108 (90 Base) MCG/ACT inhaler Inhale 2 puffs into the lungs every 4 (four) hours as needed for wheezing or shortness of breath (cough). 04/06/17   Tillman Sers, DO  Cyanocobalamin (VITAMIN B 12 PO) Place 1 tablet under the tongue daily.    [provider]  fluticasone furoate-vilanterol (BREO ELLIPTA) 100-25 MCG/INH AEPB Inhale 1 puff into the lungs daily. 04/06/17   Tillman Sers, DO  loratadine (CLARITIN) 10 MG tablet Take 1 tablet (10 mg total) by mouth daily. 02/16/14   Charlane Ferretti, MD  Multiple Vitamin (MULITIVITAMIN WITH MINERALS) TABS Take 1 tablet by mouth daily.    [provider]  predniSONE (DELTASONE) 50 MG tablet Take 1 tablet (50 mg total) by mouth daily with breakfast. 04/06/17   Tillman Sers, DO  Probiotic Product (PRO-BIOTIC BLEND PO) Take 1 tablet by mouth daily.    [provider]    Family History Family History  Problem Relation Age of Onset  . Asthma Mother   . Heart disease Father 12       MI  . Alcohol abuse Father   . Asthma Sister 9       death from asthma    Social History Social History  Substance Use Topics  . Smoking  status: Never Smoker  . Smokeless tobacco: Never Used  . Alcohol use No     Allergies   Patient has no known allergies.   Review of Systems Review of Systems  Constitutional: Negative for appetite change, diaphoresis, fatigue, fever and unexpected weight change.  HENT: Negative for mouth sores.   Eyes: Negative for visual disturbance.  Respiratory: Positive for cough, chest tightness, shortness of breath and wheezing.   Cardiovascular: Positive for chest pain ( With deep inspiration).  Gastrointestinal: Negative for abdominal pain, constipation, diarrhea, nausea and  vomiting.  Endocrine: Negative for polydipsia, polyphagia and polyuria.  Genitourinary: Negative for dysuria, frequency, hematuria and urgency.  Musculoskeletal: Negative for back pain and neck stiffness.  Skin: Negative for rash.  Allergic/Immunologic: Negative for immunocompromised state.  Neurological: Negative for syncope, light-headedness and headaches.  Hematological: Does not bruise/bleed easily.  Psychiatric/Behavioral: Negative for sleep disturbance. The patient is not nervous/anxious.   All other systems reviewed and are negative.    Physical Exam Updated Vital Signs BP (!) 144/95 (BP Location: Right Arm)   Pulse 76   Temp 98.1 F (36.7 C) (Oral)   Resp 20   Ht 5\' 6"  (1.676 m)   Wt 90.7 kg (200 lb)   SpO2 97%   BMI 32.28 kg/m   Physical Exam  Constitutional: She appears well-developed and well-nourished. No distress.  Awake, alert, nontoxic appearance  HENT:  Head: Normocephalic and atraumatic.  Mouth/Throat: Oropharynx is clear and moist. No oropharyngeal exudate.  Eyes: Conjunctivae are normal. No scleral icterus.  Neck: Normal range of motion. Neck supple.  Cardiovascular: Normal rate, regular rhythm and intact distal pulses.   Pulmonary/Chest: Accessory muscle usage present. Tachypnea noted. She is in respiratory distress. She has decreased breath sounds. She has wheezes.  Equal chest expansion  Abdominal: Soft. Bowel sounds are normal. She exhibits no mass. There is no tenderness. There is no rebound and no guarding.  Musculoskeletal: Normal range of motion. She exhibits no edema.  Neurological: She is alert.  Speech is clear and goal oriented Moves extremities without ataxia  Skin: Skin is warm and dry. She is not diaphoretic.  Psychiatric: She has a normal mood and affect.  Nursing note and vitals reviewed.    ED Treatments / Results  Labs (all labs ordered are listed, but only abnormal results are displayed) Labs Reviewed  CBC WITH  DIFFERENTIAL/PLATELET  BASIC METABOLIC PANEL    Radiology Dg Chest 2 View  Result Date: 05/02/2017 CLINICAL DATA:  Shortness of breath, asthma attack EXAM: CHEST  2 VIEW COMPARISON:  05/12/2016 FINDINGS: Mild diffuse increased interstitial opacity. No focal consolidation or pleural effusion. Small upper lobe nodular opacities normal heart size. No pneumothorax. IMPRESSION: 1. Slight increased diffuse interstitial prominence suggests acute interstitial inflammation on underlying chronic changes 2. Small nodular foci of airspace disease in the upper lobes could be due to inflammation or infection. Radiographic follow-up is recommended. Electronically Signed   By: Jasmine PangKim  Fujinaga M.D.   On: 05/02/2017 03:47    Procedures Procedures (including critical care time)  Medications Ordered in ED Medications  magnesium sulfate IVPB 2 g 50 mL (2 g Intravenous New Bag/Given 05/02/17 0552)  albuterol (PROVENTIL,VENTOLIN) solution continuous neb (not administered)  ipratropium-albuterol (DUONEB) 0.5-2.5 (3) MG/3ML nebulizer solution 3 mL (3 mLs Nebulization Given 05/02/17 0308)  albuterol (PROVENTIL) (2.5 MG/3ML) 0.083% nebulizer solution 5 mg (5 mg Nebulization Given 05/02/17 0554)  ipratropium (ATROVENT) nebulizer solution 0.5 mg (0.5 mg Nebulization Given 05/02/17 0553)  methylPREDNISolone sodium succinate (  SOLU-MEDROL) 125 mg/2 mL injection 125 mg (125 mg Intravenous Given 05/02/17 0554)  ipratropium (ATROVENT) nebulizer solution 0.5 mg (0.5 mg Nebulization Given 05/02/17 0553)     Initial Impression / Assessment and Plan / ED Course  I have reviewed the triage vital signs and the nursing notes.  Pertinent labs & imaging results that were available during my care of the patient were reviewed by me and considered in my medical decision making (see chart for details).     Patient presents with asthma exacerbation. She has attempted her rescue medications without relief. Initial nebulizer here without  significant improvement. Patient is without hypoxia but does have mild respiratory distress with tachypnea and accessory muscle usage. Wheezing throughout. Chest x-ray shows small upper lobe nodular opacities however patient is afebrile and does not endorse pneumonia symptoms. This may be atelectasis.   At shift change care was transferred to Fayrene Helper, PA-C who will follow pending studies, re-evaulate after medications have been administered and determine disposition.     Final Clinical Impressions(s) / ED Diagnoses   Final diagnoses:  Exacerbation of asthma, unspecified asthma severity, unspecified whether persistent    New Prescriptions New Prescriptions   No medications on file     Milta Deiters 05/02/17 1610    Derwood Kaplan, MD 05/02/17 [phone]

## 2017-05-02 NOTE — ED Notes (Signed)
Patient ambulated with pulse oximetry on room air.  O2 saturation decreased to 87%, patient complains of increased shortness of breath.  Patient returned to room to rest on stretcher, O2 saturation quickly returned to 97%.

## 2017-05-02 NOTE — Progress Notes (Signed)
Late Entry for 1230  Lydia Griffin [id number] Admitted to [id number]: 05/02/2017 8:00 PM Attending Provider: Carney Livinghambliss, Marshall L, MD    Lydia Griffin is a 56 y.o. female patient admitted from ED awake, alert  & orientated  X 3,  Full Code, VSS - Blood pressure 121/65, pulse 100, temperature 98.2 F (36.8 C), resp. rate 18, height 5\' 6"  (1.676 m), weight 90.7 kg (200 lb), SpO2 91 %., R/A, no c/o shortness of breath, no c/o chest pain, no distress noted. Non Tele.   IV site WDL:  antecubital right, condition patent and no redness with a transparent dsg that's clean dry and intact.  Allergies:  No Known Allergies   Past Medical History:  Diagnosis Date  . Asthma    Started as a child; Has been intubated for asthma exacerbation twice in approx 1990  . Shortness of breath    "related to asthma attacks" (09/19/2012)    History:  obtained from the patient.  Pt orientation to unit, room and routine. Information packet given to patient/family and safety video watched.  Admission INP armband ID verified with patient/family, and in place. SR up x 2, fall risk assessment complete with Patient and family verbalizing understanding of risks associated with falls. Pt verbalizes an understanding of how to use the call bell and to call for help before getting out of bed.  Skin, clean-dry- intact without evidence of bruising, or skin tears.   No evidence of skin break down noted on exam.    Will cont to monitor and assist as needed.  Laural BenesJohnson, [name], RN 05/02/2017 8:00 PM

## 2017-05-02 NOTE — ED Triage Notes (Signed)
Pt presents C/O SOB X past few days, hx of asthma. Also endorses cough. Denies fever/chills. Has ran ou of inhaler at home. O2 WNL, starting neb tx in triage

## 2017-05-02 NOTE — Care Management Note (Addendum)
Case Management Note  Patient Details  Name: Lydia Griffin MRN: [id number] Date of Birth: [birth year]  Subjective/Objective:                  56 y.o. female from home presenting with increased wheezing, chest tightness. PMH is significant for asthma.   Action/Plan: Admit status OBSERVATION (Asthma exacerbation); anticipate discharge HOME WITH SELF CARE, MATCH.  Expected Discharge Date:   (unknown)               Expected Discharge Plan:  Home/Self Care  In-House Referral:     Discharge planning Services  CM Consult, MATCH Program, Medication Assistance  Post Acute Care Choice:    Choice offered to:     DME Arranged:    DME Agency:     HH Arranged:    HH Agency:     Status of Service:  In process, will continue to follow  If discussed at Long Length of Stay Meetings, dates discussed:    Additional Comments: WUJ:[id number]:[facility]) Oletta CohnWood, Lucca Greggs, RN 05/02/2017, 11:20 AM

## 2017-05-02 NOTE — ED Provider Notes (Signed)
Long hx of Asthma.  Financial difficulty with getting meds.  Currently on [facility] but  Having difficulty affording it.  Has been increasing asthma flare x 1 week not well treated with her meds.    Start hr long nebs, and recheck (ambulate with O2).  If no improvement, will consider admission.  Worthwhile getting case management consult to help with medication (pt tolerates Dulera well).    6:56 AM Patient is currently receiving her hour-long nebs. She is well-appearing. Heart with normal rate and rhythm no murmurs rubs or gallops, lungs with diffuse wheezes both inspiratory and expiratory, with rhonchi. Abdomen is soft nontender.  Evidence of a mild hypokalemia, potassium supplement given.   8:43 AM After receiving magnesium sulfate, continue nebs, Solu-Medrol, patient did report some improvement however her lung exam is still remarkable for moderate inspiratory and expiratory wheezes with rhonchi.  Pt report she feels very tired simply walking from the room to the bathroom earlier. Plan admission for further management of her asthma exacerbation.  8:55 AM Appreciate consultation from [facility] resident who agrees to see pt in the ER and will admit for asthma exacerbation.    BP 136/84 (BP Location: Left Arm)   Pulse 72   Temp 98.1 F (36.7 C) (Oral)   Resp 18   Ht 5\' 6"  (1.676 m)   Wt 90.7 kg (200 lb)   SpO2 100%   BMI 32.28 kg/m   Results for orders placed or performed during the hospital encounter of 05/02/17  CBC with Differential  Result Value Ref Range   WBC 5.5 4.0 - 10.5 K/uL   RBC 4.60 3.87 - 5.11 MIL/uL   Hemoglobin 12.4 12.0 - 15.0 g/dL   HCT 16.1 09.6 - 04.5 %   MCV 82.2 78.0 - 100.0 fL   MCH 27.0 26.0 - 34.0 pg   MCHC 32.8 30.0 - 36.0 g/dL   RDW 40.9 81.1 - 91.4 %   Platelets 338 150 - 400 K/uL   Neutrophils Relative % 41 %   Neutro Abs 2.3 1.7 - 7.7 K/uL   Lymphocytes Relative 42 %   Lymphs Abs 2.3 0.7 - 4.0 K/uL   Monocytes Relative 10 %   Monocytes Absolute 0.5  0.1 - 1.0 K/uL   Eosinophils Relative 7 %   Eosinophils Absolute 0.4 0.0 - 0.7 K/uL   Basophils Relative 0 %   Basophils Absolute 0.0 0.0 - 0.1 K/uL  Basic metabolic panel  Result Value Ref Range   Sodium 136 135 - 145 mmol/L   Potassium 3.2 (L) 3.5 - 5.1 mmol/L   Chloride 106 101 - 111 mmol/L   CO2 23 22 - 32 mmol/L   Glucose, Bld 99 65 - 99 mg/dL   BUN 6 6 - 20 mg/dL   Creatinine, Ser 7.82 0.44 - 1.00 mg/dL   Calcium 8.8 (L) 8.9 - 10.3 mg/dL   GFR calc non Af Amer >60 >60 mL/min   GFR calc Af Amer >60 >60 mL/min   Anion gap 7 5 - 15   Dg Chest 2 View  Result Date: 05/02/2017 CLINICAL DATA:  Shortness of breath, asthma attack EXAM: CHEST  2 VIEW COMPARISON:  05/12/2016 FINDINGS: Mild diffuse increased interstitial opacity. No focal consolidation or pleural effusion. Small upper lobe nodular opacities normal heart size. No pneumothorax. IMPRESSION: 1. Slight increased diffuse interstitial prominence suggests acute interstitial inflammation on underlying chronic changes 2. Small nodular foci of airspace disease in the upper lobes could be due to inflammation or infection.  Radiographic follow-up is recommended. Electronically Signed   By: Jasmine PangKim  Fujinaga M.D.   On: 05/02/2017 03:47      Fayrene Helperran, Chason Mciver, PA-C 05/02/17 [phone]

## 2017-05-02 NOTE — H&P (Signed)
Family Medicine Teaching [facility] Ad[facility] History and Physical Service Pager: [phone]  Patient name: Lydia OrganValerie O Kazee Medical record number: [id number] Date of birth: Mar 08, 1961 Age: 56 y.o. Gender: female  Primary Care Provider: Tillman Sersiccio, Angela C, DO Consultants: None Code Status: Full (confirmed on admission)  Chief Complaint: asthma exacerbation  Assessment and Plan: Lydia Griffin is a 56 y.o. female presenting with increased wheezing, chest tightness. PMH is significant for asthma.  #Asthma exacerbation: Poorly controlled on current regimen with triggers of tobacco smoke exposure and weather changes. No leukocytosis (WBC 5.5) or fevers to suggest pneumonia, though CXR showed mild diffuse increased interstitial prominence and small upper lobe nodular opacities. Patient with nonproductive cough x 1 day and does have coarse breath sounds. Respiratory effort much improved after breathing treatments in ED. RR 18-20. Maintaining O2 sats on room air at rest.   - Admit to Observation, attending Dr. Deirdre Priesthambliss - Restart dulera while hospitalized and try to coordinate discharge on this medication - Duonebs q2h prn and q4h scheduled - Restart singulair - s/p solumedrol 125 mg in ED; would continue with prednisone 50 mg 7/12 - May need prolonged steroid taper  - Continuous pulse oximetry - Supplemental O2 prn to maintain O2 sats > 94% - Consider repeat chest x-ray in a couple months when patient better to look for resolution of nodules or if patient's respiratory status were to worsen. Could consider treating for an atypical pneumonia (azithromycin) given coarse breath sounds if not showing improvement.  - a.m. CBC  #Increased fatigue: Suspect 2/2 increased metabolic effort and asthma exacerbation. No anemia on CBC. No ST changes on EKG. - Up with assistance  #Difficulties affording medications: [facility] has only had breo samples available, whereas dulera and advair have worked better for  patient. Patient reports she should be starting a new job and may have insurance coverage in about 1 month.  - CM consult  #Hypokalemia: Suspect 2/2 to increased albuterol usage. T-wave inversions on EKG, but only leads v1 and v2.  - s/p kdur 40 mEq in ED - Continue to monitor and replete prn - am EKG  #Nausea: - prn zofran  FEN/GI: regular diet, saline lock IV Prophylaxis: SQ lovenox  Disposition: Regular floor bed as improved after initial breathing treatments. Anticipate home 7/12 pending clinical improvement.  History of Present Illness:  Lydia OrganValerie O Dea is a 56 y.o. female presenting with increased dyspnea and wheezing for last few days. Wheezing became acutely worse over the last day, though patient says she has been having more trouble with her asthma over the last couple of months. She notes changes in weather as a typical trigger. She completed a 5-day course of prednisone 50 mg mid-June and had been feeling better. She also completed a course of steroids this spring. She went out Sunday for a cousin's birthday celebration and was exposed to smoke. Since then, she has felt weaker and has had chest tightness. She denies chest pain but says it feels like she can't get a full deep breath. Exertion makes SOB worse. She just refilled an albuterol inhaler with 200 uses and says it only has 80 puffs left. She reports using the rescue inhaler about every 2 hours. She had been using breo dailiy but ran out 2 days ago and also does not feel this has been controlling her asthma as well as other controller medications. She reports decreased appetite with weight loss and some nausea related to asthma flares. She threw up once recently, which she  attributes to having a large meal after not eating much. 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 She has had a cough for about 1 day but says it is not very productive.   She reports last  being hospitalized for asthma about 1 year ago. Our records show an admission at [facility] in 2015. She was intubated in the '90s as complication of asthma exacerbation and has a sibling who died as a child due to asthma.  Prior to current symptoms, she says she had been well controlled on dulera but could not afford this. Advair had also worked well in the past. She says she only rarely needed to use her rescue inhaler (maybe 3 times in 2 months). She has been using breo because samples were available at [facility]. 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   She recalls being on singulair as a child but not recently. Instead, she has been "popping" claritin because she feels it helps her sneeze, which makes her feel better. She denies nasal congestion or drainage.   In the ED, patient initially was tachypneic and has accessory muscle usage. She received magnesium sulfate, continuous nebs x 1, Solu-Medrol, and duonebs with some improvement. Was ambulated in the ED and had O2 sats decrease to 87% and increased dyspnea, but this quickly improved with rest.    Review Of Systems: Per HPI with the following additions:   Review of Systems  Constitutional: Positive for malaise/fatigue and weight loss. Negative for chills and fever.  HENT: Negative for congestion, sinus pain and sore throat.   Eyes: Negative for double vision and pain.  Respiratory: Positive for cough, shortness of breath and wheezing.   Cardiovascular: Positive for palpitations. Negative for chest pain.  Gastrointestinal: Positive for nausea. Negative for abdominal pain, constipation, diarrhea and vomiting.  Genitourinary: Negative for dysuria and urgency.  Musculoskeletal: Negative for falls and neck pain.  Skin: Negative for rash.  Neurological: Positive for weakness. Negative for focal weakness and loss of  consciousness.    Patient Active Problem List   Diagnosis Date Noted  . Healthcare maintenance 02/07/2017  . Screening cholesterol level 10/25/2016  . Insomnia 12/30/2012  . Vaginal discharge 02/09/2012  . Asthma with acute exacerbation 08/15/2011  . Asthma, chronic 11/02/2009  . Overweight(278.02) 12/20/2006  . DEPRESSION, MAJOR, RECURRENT 12/20/2006    Past Medical History: Past Medical History:  Diagnosis Date  . Asthma    Started as a child; Has been intubated for asthma exacerbation twice in approx 1990  . Shortness of breath    "related to asthma attacks" (09/19/2012)    Past Surgical History: Past Surgical History:  Procedure Laterality Date  . hand     flexor tendon repair  . TUBAL LIGATION  ~ 1985    Social History: Social History  Substance Use Topics  . Smoking status: Never Smoker  . Smokeless tobacco: Never Used  . Alcohol use No   Additional social history: Lives with her mother (in her 26s). Drinks a lot of coffee but reports no illegal substance abuse.  Please also refer to relevant sections of EMR.  Family History: Family History  Problem Relation Age of Onset  . Asthma Mother   . Heart disease Father 46       MI  . Alcohol abuse Father   .  Asthma Sister 9       death from asthma    Allergies and Medications: No Known Allergies No current facility-administered medications on file prior to encounter.    Current Outpatient Prescriptions on File Prior to Encounter  Medication Sig Dispense Refill  . albuterol (PROVENTIL HFA;VENTOLIN HFA) 108 (90 Base) MCG/ACT inhaler Inhale 2 puffs into the lungs every 4 (four) hours as needed for wheezing or shortness of breath (cough). 1 Inhaler 3  . Cyanocobalamin (VITAMIN B 12 PO) Place 1 tablet under the tongue daily.    . fluticasone furoate-vilanterol (BREO ELLIPTA) 100-25 MCG/INH AEPB Inhale 1 puff into the lungs daily. 2 each 0  . loratadine (CLARITIN) 10 MG tablet Take 1 tablet (10 mg total) by  mouth daily. 30 tablet 0  . Multiple Vitamin (MULITIVITAMIN WITH MINERALS) TABS Take 1 tablet by mouth daily.    . predniSONE (DELTASONE) 50 MG tablet Take 1 tablet (50 mg total) by mouth daily with breakfast. 5 tablet 0  . Probiotic Product (PRO-BIOTIC BLEND PO) Take 1 tablet by mouth daily.      Objective: BP (!) 145/88   Pulse 95   Temp 98.1 F (36.7 C) (Oral)   Resp 18   Ht 5\' 6"  (1.676 m)   Wt 200 lb (90.7 kg)   SpO2 97%   BMI 32.28 kg/m  Exam: General: Well-appearing female, in NAD Eyes: PERRL, EOMI ENTM: MMM, normal posterior oropharynx Neck: Supple, FROM Cardiovascular: Slightly tachycardic, S1, S2, no mrg Respiratory: Good air movement throughout but with coarse breath sounds and expiratory wheezing with prolonged expiratory phase. Speaking in complete sentences with ease and no increased WOB.  Gastrointestinal: +BS, soft, NT, ND MSK: Strength 5/5 of upper and lower extremities. Normal tone. Derm: No rashes or lesions noted on exposed skin. Neuro: AOx3, no focal deficits.  Psych: Normal mood and affect.   Labs and Imaging: CBC BMET   Recent Labs Lab 05/02/17 0548  WBC 5.5  HGB 12.4  HCT 37.8  PLT 338    Recent Labs Lab 05/02/17 0548  NA 136  K 3.2*  CL 106  CO2 23  BUN 6  CREATININE 0.79  GLUCOSE 99  CALCIUM 8.8*     Dg Chest 2 View  Result Date: 05/02/2017 CLINICAL DATA:  Shortness of breath, asthma attack EXAM: CHEST  2 VIEW COMPARISON:  05/12/2016 FINDINGS: Mild diffuse increased interstitial opacity. No focal consolidation or pleural effusion. Small upper lobe nodular opacities normal heart size. No pneumothorax. IMPRESSION: 1. Slight increased diffuse interstitial prominence suggests acute interstitial inflammation on underlying chronic changes 2. Small nodular foci of airspace disease in the upper lobes could be due to inflammation or infection. Radiographic follow-up is recommended. Electronically Signed   By: Jasmine Pang M.D.   On: 05/02/2017  03:47    Casey Burkitt, MD 05/02/2017, 8:56 AM PGY-3, [facility] FPTS Intern pager: [phone], text pages welcome

## 2017-05-02 NOTE — Progress Notes (Signed)
Notified MD oncall that pt requesting medication for headache and sleep. Will continue to monitor pt. Nelda MarseilleJenny Thacker, RN

## 2017-05-02 NOTE — ED Notes (Signed)
ED Provider at bedside. 

## 2017-05-03 LAB — CBC
HCT: 37.4 % (ref 36.0–46.0)
HEMOGLOBIN: 12.9 g/dL (ref 12.0–15.0)
MCH: 28.5 pg (ref 26.0–34.0)
MCHC: 34.5 g/dL (ref 30.0–36.0)
MCV: 82.7 fL (ref 78.0–100.0)
Platelets: 297 10*3/uL (ref 150–400)
RBC: 4.52 MIL/uL (ref 3.87–5.11)
RDW: 14.8 % (ref 11.5–15.5)
WBC: 13.8 10*3/uL — ABNORMAL HIGH (ref 4.0–10.5)

## 2017-05-03 LAB — BASIC METABOLIC PANEL
ANION GAP: 9 (ref 5–15)
BUN: 6 mg/dL (ref 6–20)
CALCIUM: 9 mg/dL (ref 8.9–10.3)
CHLORIDE: 108 mmol/L (ref 101–111)
CO2: 19 mmol/L — AB (ref 22–32)
CREATININE: 0.69 mg/dL (ref 0.44–1.00)
GFR calc non Af Amer: >60 mL/min (ref >60)
GLUCOSE: 105 mg/dL — AB (ref 65–99)
Potassium: 4.1 mmol/L (ref 3.5–5.1)
Sodium: 136 mmol/L (ref 135–145)

## 2017-05-03 MED ORDER — MOMETASONE FURO-FORMOTEROL FUM 200-5 MCG/ACT IN AERO: 2.0000 | INHALATION_SPRAY | Freq: Two times a day (BID) | RESPIRATORY_TRACT | 0 refills | Status: DC

## 2017-05-03 MED ORDER — PREDNISONE 10 MG PO TABS: ORAL_TABLET | ORAL | 0 refills | Status: DC

## 2017-05-03 MED ORDER — IPRATROPIUM-ALBUTEROL 0.5-2.5 (3) MG/3ML IN SOLN
3.0000 mL | Freq: Four times a day (QID) | RESPIRATORY_TRACT | Status: DC
  Administered 2017-05-03: 3 mL via RESPIRATORY_TRACT
  Filled 2017-05-03: qty 3

## 2017-05-03 MED ORDER — PREDNISONE 5 MG PO TABS: 5.0000 mg | ORAL_TABLET | Freq: Every day | ORAL | 0 refills | Status: DC

## 2017-05-03 NOTE — Progress Notes (Signed)
CM received consult:Patient has had better control of asthma on dulera but cannot afford. CM spoke with pt and was informed the application for the patient assistance program  is pending. CM called Dulera/ [name] [phone]( [phone])  to find out where  in the process are we in establishing approval for the medication. Elvia , [name] rep. stated pt has been approved for the medication however MD's information (NPI/ expiration date, and MIP) is  needed to complete the order.  CM made MD aware and MD stated she will call give information needed. Gae GallopAngela Sade Mehlhoff RN,BSN,CM

## 2017-05-03 NOTE — Progress Notes (Signed)
SATURATION QUALIFICATIONS: (This note is used to comply with regulatory documentation for home oxygen)  Patient Saturations on Room Air at Rest = 96%  Patient Saturations on Room Air while Ambulating = 92-94%  Patient Saturations on 0 Liters of oxygen while Ambulating = 92%  Please briefly explain why patient needs home oxygen:

## 2017-05-03 NOTE — Progress Notes (Signed)
Family Medicine Teaching Service Daily Progress Note Intern Pager: [phone]  Patient name: Lydia Griffin Medical record number: [id number] Date of birth: Dec 27, 1960 Age: 56 y.o. Gender: female  Primary Care Provider: Tillman Sers, DO Consultants: None  Code Status: Full  Pt Overview and Major Events to Date:  Lydia Griffin is a 56 y.o. Female presenting with increased wheezing and chest tightness. Patient was admitted to [facility] on 05/02/2017.   Assessment and Plan: Lydia Griffin is a 56 y.o. female presenting with increased wheezing, chest tightness. PMH is significant for asthma.  Asthma exacerbation:  Patient's asthma is poorly controlled on current regimen. Current regimen of breo ellipta and albuterol prn inhaler. Triggers include tobacco smoke exposure and weather changes. WBC on admission of 5.5 and temperature of  98.2, making pneumonia unlikely. Current WBC 13.8, likely due to steroid use. CXR on 05/02/17 showed mild diffuse increased interstitial prominence and small upper lobe nodular opacities. Patient today denies cough or sputum production.  Respiratory effort much improved after breathing treatments in ED. Current RR of 18 with O2 saturation of 93% on room air.   - Restart dulera while hospitalized and try to coordinate discharge on this medication - continue Duonebs q2h prn and q4h scheduled  - Restart singulair 10 mg  - continue prednisone 50 mg  - will discharge on  2 week prednisone taper  - monitor O2 saturation and respiratory status - consider supplemental O2 prn to maintain O2 sats > 94% - repeat chest x-ray as outpatient to look for resolution of nodules or if patient's respiratory status were to worsen. Could consider treating for an atypical pneumonia (azithromycin) given coarse breath sounds if not showing improvement.  -consider higher dose breo, singulair, and prn albuterol inhaler for discharge     Increased fatigue: Suspect 2/2 increased metabolic effort and asthma  exacerbation. CBC showing Hgb and Hct wnl, suggestive of no anemia. No ST changes on EKG on 05/02/17. - Up with assistance  Difficulties affording medications:  [facility] has only had breo samples available, whereas dulera and advair have worked better for patient. Patient reports she has started a new job and may have insurance coverage in about 1 month.  - CM consult  Hypokalemia-resolved  K of 3.2 on admission. Current K of 4.1. Patient was given 40 mEq of kdur in ED. Suspect 2/2 to increased albuterol usage. EKG on admission showing T-wave inversions but only leads v1 and v2.  - Continue to monitor and replete prn - am EKG  Nausea: Patient today denies nausea and states has been able to eat since yesterday.  - prn zofran  FEN/GI: regular diet  PPx: lovenox   Disposition: will be discharged to home   Subjective:  Lydia Griffin is a 56 y.o. Female presenting with increased wheezing and chest tightness. Patient was admitted to [facility] on 05/02/2017. Patient states today feels fine. Patient states breathing has improved and denies SOB, cough, or sputum production. Patient states she feels winded at times with ambulation, but has improved since hospitalization. Patient denies dizziness, lightheadedness, chest pain, and nausea. Patient was interested in outpatient wellness program for diet and exercise.   Objective: Temp:  [98 F (36.7 C)-98.4 F (36.9 C)] 98.4 F (36.9 C) (07/12 0556) Pulse Rate:  [65-105] 65 (07/12 0556) Resp:  [16-18] 18 (07/12 0556) BP: (121-138)/(65-72) 123/67 (07/12 0556) SpO2:  [91 %-98 %] 93 % (07/12 0756) Physical Exam: General: NAD, awake and alert, sitting up in bed  Cardiovascular: RRR, no MRG  Respiratory: course breath sounds, expiratory wheezing, no increased work of breathing  Abdomen: soft, non tender, non distended, bowel sounds heard in all 4 quadrants  Extremities: normal range of motion, no edema, 5+ muscle strength    Laboratory:  Recent Labs Lab  05/02/17 0548 05/03/17 0543  WBC 5.5 13.8*  HGB 12.4 12.9  HCT 37.8 37.4  PLT 338 297    Recent Labs Lab 05/02/17 0548 05/03/17 0543  NA 136 136  K 3.2* 4.1  CL 106 108  CO2 23 19*  BUN 6 6  CREATININE 0.79 0.69  CALCIUM 8.8* 9.0  GLUCOSE 99 105*     Imaging/Diagnostic Tests: Dg Chest 2 View  Result Date: 05/02/2017 CLINICAL DATA:  Shortness of breath, asthma attack EXAM: CHEST  2 VIEW COMPARISON:  05/12/2016 FINDINGS: Mild diffuse increased interstitial opacity. No focal consolidation or pleural effusion. Small upper lobe nodular opacities normal heart size. No pneumothorax. IMPRESSION: 1. Slight increased diffuse interstitial prominence suggests acute interstitial inflammation on underlying chronic changes 2. Small nodular foci of airspace disease in the upper lobes could be due to inflammation or infection. Radiographic follow-up is recommended. Electronically Signed   By: Jasmine Pang M.D.   On: 05/02/2017 03:47     Oralia Manis, DO 05/03/2017, 11:48 AM PGY-1, [place] FPTS Intern pager: [phone], text pages welcome

## 2017-05-03 NOTE — Discharge Summary (Signed)
Family Medicine Teaching [facility] Discharge Summary  Patient name: Lydia Griffin Medical record number: [id number] Date of birth: 1961-09-05 Age: 56 y.o. Gender: female Date of Admission: 05/02/2017  Date of Discharge: 05/03/2017 Admitting Physician: Carney Living, MD  Primary Care Provider: Tillman Sers, DO Consultants: None  Indication for Hospitalization: shortness of breath  Discharge Diagnoses/Problem List:  Asthma exacerbation  Increased fatigue Hypokalemia  Nausea   Disposition: discharge to home   Discharge Condition: stable, improved   Discharge Exam:  General: NAD, awake and alert, sitting up in bed  Cardiovascular: RRR, no MRG Respiratory: course breath sounds, expiratory wheezing, no increased work of breathing  Abdomen: soft, non tender, non distended, bowel sounds heard in all 4 quadrants  Extremities: normal range of motion, no edema, 5+ muscle strength    Brief Hospital Course:  Lydia Griffin is a 56 y.o. Female presenting with asthma exacerbation. Patient states she has been having difficulty obtaining medications due to financial reasons, and breo samples have not been providing adequate treatment. CXR on admission showed mild diffuse increased interstitial prominence and small upper lobe nodular opacities. Patient was restarted on dulera, duonebs q2h prn, patient was restarted on Singulair. Patient was given solumedrol 125 mg in ED and transitioned to PO prednisone 50 mg on 7/12. Patient showed clinical improvement and maintained O2 saturation on room air. Patient denied SOB, cough or sputum production on day of discharge. Patient was discharged on dulera and 2 week prednisone taper. Patient was recommended to f/u with PCP to discuss wellness program for diet and exercise and to set affordable asthma medication regimen.   Issues for Follow Up:  1. Was discharged on dulera, consider high dose breo at outpatient follow up. Consider providing samples   2. Singulair preferred per Dr. Raymondo Band, but patient cannot afford  3. F/u with PCP for hospital follow up   Significant Procedures: none  Significant Labs and Imaging:   Recent Labs Lab 05/02/17 0548 05/03/17 0543  WBC 5.5 13.8*  HGB 12.4 12.9  HCT 37.8 37.4  PLT 338 297    Recent Labs Lab 05/02/17 0548 05/03/17 0543  NA 136 136  K 3.2* 4.1  CL 106 108  CO2 23 19*  GLUCOSE 99 105*  BUN 6 6  CREATININE 0.79 0.69  CALCIUM 8.8* 9.0    Dg Chest 2 View  Result Date: 05/02/2017 CLINICAL DATA:  Shortness of breath, asthma attack EXAM: CHEST  2 VIEW COMPARISON:  05/12/2016 FINDINGS: Mild diffuse increased interstitial opacity. No focal consolidation or pleural effusion. Small upper lobe nodular opacities normal heart size. No pneumothorax. IMPRESSION: 1. Slight increased diffuse interstitial prominence suggests acute interstitial inflammation on underlying chronic changes 2. Small nodular foci of airspace disease in the upper lobes could be due to inflammation or infection. Radiographic follow-up is recommended. Electronically Signed   By: Jasmine Pang M.D.   On: 05/02/2017 03:47     Results/Tests Pending at Time of Discharge: none  Discharge Medications:  Allergies as of 05/03/2017   No Known Allergies     Medication List    STOP taking these medications   fluticasone furoate-vilanterol 100-25 MCG/INH Aepb Commonly known as:  BREO ELLIPTA     TAKE these medications   albuterol (2.5 MG/3ML) 0.083% nebulizer solution Commonly known as:  PROVENTIL Take 2.5 mg by nebulization every 4 (four) hours as needed for wheezing or shortness of breath.   albuterol 108 (90 Base) MCG/ACT inhaler Commonly known as:  PROVENTIL HFA;VENTOLIN HFA  Inhale 2 puffs into the lungs every 4 (four) hours as needed for wheezing or shortness of breath (cough).   loratadine 10 MG tablet Commonly known as:  CLARITIN Take 1 tablet (10 mg total) by mouth daily.   mometasone-formoterol 200-5  MCG/ACT Aero Commonly known as:  DULERA Inhale 2 puffs into the lungs 2 (two) times daily.   predniSONE 10 MG tablet Commonly known as:  DELTASONE Take 5 tabs (50 mg) x 2 days, then 3 tabs (30 mg) x 2 days, then 2 tabs (2 mg) x 2 days, then 1 tab (10 mg) x 3 days What changed:  medication strength  how much to take  how to take this  when to take this  additional instructions   predniSONE 5 MG tablet Commonly known as:  DELTASONE Take 1 tablet (5 mg total) by mouth daily. Take 1 tab (5 mg) x 3 days, then half a tab (2.5 mg) x 2 days What changed:  You were already taking a medication with the same name, and this prescription was added. Make sure you understand how and when to take each.       Discharge Instructions: Please refer to Patient Instructions section of EMR for full details.  Patient was counseled important signs and symptoms that should prompt return to medical care, changes in medications, dietary instructions, activity restrictions, and follow up appointments.   Follow-Up Appointments: Follow-up Information    Mayo, Allyn Kenner, MD. Go on 05/07/2017.   Specialty:  Family Medicine Why:  Please arrive to hosptal follow up appointment @ 3:35pm.  Contact information: [address] [place] [address] [phone]           Oralia Manis, DO 05/03/2017, 1:23 PM PGY-1, [facility]

## 2017-05-03 NOTE — Progress Notes (Signed)
Nsg Discharge Note  Admit Date:  05/02/2017 Discharge date: 05/03/2017   Ronni RumbleValerie O Brosh to be D/C'd Home per MD order.  AVS completed.  Copy for chart, and copy for patient signed, and dated. Patient/caregiver able to verbalize understanding.  Discharge Medication: Allergies as of 05/03/2017   No Known Allergies     Medication List    STOP taking these medications   fluticasone furoate-vilanterol 100-25 MCG/INH Aepb Commonly known as:  BREO ELLIPTA     TAKE these medications   albuterol (2.5 MG/3ML) 0.083% nebulizer solution Commonly known as:  PROVENTIL Take 2.5 mg by nebulization every 4 (four) hours as needed for wheezing or shortness of breath.   albuterol 108 (90 Base) MCG/ACT inhaler Commonly known as:  PROVENTIL HFA;VENTOLIN HFA Inhale 2 puffs into the lungs every 4 (four) hours as needed for wheezing or shortness of breath (cough).   loratadine 10 MG tablet Commonly known as:  CLARITIN Take 1 tablet (10 mg total) by mouth daily.   mometasone-formoterol 200-5 MCG/ACT Aero Commonly known as:  DULERA Inhale 2 puffs into the lungs 2 (two) times daily.   predniSONE 10 MG tablet Commonly known as:  DELTASONE Take 5 tabs (50 mg) x 2 days, then 3 tabs (30 mg) x 2 days, then 2 tabs (2 mg) x 2 days, then 1 tab (10 mg) x 3 days What changed:  medication strength  how much to take  how to take this  when to take this  additional instructions   predniSONE 5 MG tablet Commonly known as:  DELTASONE Take 1 tablet (5 mg total) by mouth daily. Take 1 tab (5 mg) x 3 days, then half a tab (2.5 mg) x 2 days What changed:  You were already taking a medication with the same name, and this prescription was added. Make sure you understand how and when to take each.       Discharge Assessment: Vitals:   05/03/17 0556 05/03/17 1410  BP: 123/67 128/76  Pulse: 65 98  Resp: 18 19  Temp: 98.4 F (36.9 C) 98.1 F (36.7 C)   Skin clean, dry and intact without evidence of skin  break down, no evidence of skin tears noted. IV catheter discontinued intact. Site without signs and symptoms of complications - no redness or edema noted at insertion site, patient denies c/o pain - only slight tenderness at site.  Dressing with slight pressure applied.  D/c Instructions-Education: Discharge instructions given to patient/family with verbalized understanding. D/c education completed with patient/family including follow up instructions, medication list, d/c activities limitations if indicated, with other d/c instructions as indicated by MD - patient able to verbalize understanding, all questions fully answered. Patient instructed to return to ED, call 911, or call MD for any changes in condition.  Patient escorted via WC, and D/C home via private auto.  Connor Meacham Consuella Loselaine, RN 05/03/2017 4:48 PM

## 2017-05-07 ENCOUNTER — Inpatient Hospital Stay: Payer: Self-pay | Admitting: Internal Medicine

## 2017-05-09 ENCOUNTER — Telehealth: Payer: Self-pay | Admitting: Family Medicine

## 2017-05-09 NOTE — Telephone Encounter (Signed)
No answer. Left message requesting pt to call back to schedule HFU. - Mesha Guinyard

## 2017-05-11 NOTE — Telephone Encounter (Signed)
Unsuccessful contact with pt. Left VM. - Lydia Griffin

## 2017-05-25 ENCOUNTER — Inpatient Hospital Stay: Payer: Self-pay | Admitting: Family Medicine

## 2017-07-02 ENCOUNTER — Ambulatory Visit (INDEPENDENT_AMBULATORY_CARE_PROVIDER_SITE_OTHER): Payer: Self-pay | Admitting: Family Medicine

## 2017-07-02 ENCOUNTER — Encounter: Payer: Self-pay | Admitting: Family Medicine

## 2017-07-02 VITALS — BP 140/70 | HR 70 | Temp 98.0°F | Wt 185.0 lb

## 2017-07-02 DIAGNOSIS — J4550 Severe persistent asthma, uncomplicated: Secondary | ICD-10-CM

## 2017-07-02 DIAGNOSIS — Z23 Encounter for immunization: Secondary | ICD-10-CM

## 2017-07-02 MED ORDER — ALBUTEROL SULFATE (2.5 MG/3ML) 0.083% IN NEBU
2.5000 mg | INHALATION_SOLUTION | Freq: Once | RESPIRATORY_TRACT | Status: AC
  Administered 2017-07-02: 2.5 mg via RESPIRATORY_TRACT

## 2017-07-02 MED ORDER — IPRATROPIUM BROMIDE 0.02 % IN SOLN
0.5000 mg | Freq: Once | RESPIRATORY_TRACT | Status: AC
  Administered 2017-07-02: 0.5 mg via RESPIRATORY_TRACT

## 2017-07-02 MED ORDER — MONTELUKAST SODIUM 10 MG PO TABS: 10.0000 mg | ORAL_TABLET | Freq: Every day | ORAL | 3 refills | Status: DC

## 2017-07-02 NOTE — Patient Instructions (Signed)
   It was great seeing you today!  Please let me know if you need any more help with medications! I'll see you back in 3 months.   If you have questions or concerns please do not hesitate to call at [phone].  Dolores PattyAngela Calani Gick, DO PGY-2, [facility] 07/02/2017 9:48 AM

## 2017-07-02 NOTE — Assessment & Plan Note (Addendum)
  Chronic, improved control with daily dulera. Patient with decreased air movement and wheezing in all lung fields, given a duoneb treatment in office with improvement.   -continue dulera -add singulair- patient can afford this at [employer]- 3 months supply is 17 dollars, coupon given to patient -continue albuterol as needed -continue claritin -flu shot given today -follow up in 3 months or sooner if needed -The patient indicates understanding of these issues and agrees with the plan

## 2017-07-02 NOTE — Progress Notes (Signed)
Subjective:    Patient ID: Lydia Griffin, female    DOB: October 27, 1960, 55 y.o.   MRN: [id number]   CC: "hospital fu"  Here for hospital follow up however was discharged in July. She was unable to make appointment unitl now Doing well overall. Wants flu shot today.  Asthma- taking dulera and feels much improved on it. Using albuterol the past few days with weather being bad in the morning and evening. Denies nighttime awakenings. Denies cough. She is taking claritin daily.   Otherwise she is trying to lose weight, walking with her brother for exercise and trying to eat healthier. Has been successful in losing about 20 pounds so far, which she is very happy about.  Smoking status reviewed- non-smoker  Review of Systems- see HPI   Objective:  BP 140/70   Pulse 70   Temp 98 F (36.7 C) (Oral)   Wt 185 lb (83.9 kg)   SpO2 93%   BMI 29.86 kg/m  Vitals and nursing note reviewed  General: well nourished, in no acute distress Cardiac: RRR, clear S1 and S2, no murmurs, rubs, or gallops Respiratory: diffuse wheezing with decreased air movement in all lung fields. No focal findings. No increased work of breathing.  Extremities: no edema or cyanosis.  Skin: warm and dry, no rashes noted Neuro: alert and oriented, no focal deficits  Assessment & Plan:    Asthma, chronic  Chronic, improved control with daily dulera. Patient with decreased air movement and wheezing in all lung fields, given a duoneb treatment in office with improvement.   -continue dulera -add singulair- patient can afford this at [employer]- 3 months supply is 17 dollars, coupon given to patient -continue albuterol as needed -continue claritin -flu shot given today -follow up in 3 months or sooner if needed -The patient indicates understanding of these issues and agrees with the plan     Return in about 3 months (around 10/01/2017).   Dolores Patty, DO Family Medicine Resident PGY-2

## 2017-08-09 ENCOUNTER — Emergency Department (HOSPITAL_COMMUNITY): Payer: Self-pay

## 2017-08-09 ENCOUNTER — Observation Stay (HOSPITAL_COMMUNITY)
Admission: EM | Admit: 2017-08-09 | Discharge: 2017-08-11 | Disposition: A | Discharge status: Home / Self Care | Payer: Self-pay | Attending: Family Medicine | Admitting: Family Medicine

## 2017-08-09 ENCOUNTER — Encounter (HOSPITAL_COMMUNITY): Payer: Self-pay | Admitting: Emergency Medicine

## 2017-08-09 DIAGNOSIS — J45901 Unspecified asthma with (acute) exacerbation: Secondary | ICD-10-CM | POA: Diagnosis present

## 2017-08-09 DIAGNOSIS — R110 Nausea: Secondary | ICD-10-CM | POA: Insufficient documentation

## 2017-08-09 DIAGNOSIS — R002 Palpitations: Secondary | ICD-10-CM | POA: Insufficient documentation

## 2017-08-09 DIAGNOSIS — J069 Acute upper respiratory infection, unspecified: Secondary | ICD-10-CM | POA: Insufficient documentation

## 2017-08-09 DIAGNOSIS — Z7951 Long term (current) use of inhaled steroids: Secondary | ICD-10-CM | POA: Insufficient documentation

## 2017-08-09 DIAGNOSIS — J4541 Moderate persistent asthma with (acute) exacerbation: Principal | ICD-10-CM | POA: Insufficient documentation

## 2017-08-09 LAB — CBC
HEMATOCRIT: 37.1 % (ref 36.0–46.0)
Hemoglobin: 12.3 g/dL (ref 12.0–15.0)
MCH: 27.7 pg (ref 26.0–34.0)
MCHC: 33.2 g/dL (ref 30.0–36.0)
MCV: 83.6 fL (ref 78.0–100.0)
Platelets: 268 10*3/uL (ref 150–400)
RBC: 4.44 MIL/uL (ref 3.87–5.11)
RDW: 14.8 % (ref 11.5–15.5)
WBC: 8.5 10*3/uL (ref 4.0–10.5)

## 2017-08-09 LAB — BASIC METABOLIC PANEL
ANION GAP: 9 (ref 5–15)
BUN: 6 mg/dL (ref 6–20)
CHLORIDE: 102 mmol/L (ref 101–111)
CO2: 24 mmol/L (ref 22–32)
CREATININE: 0.86 mg/dL (ref 0.44–1.00)
Calcium: 8.6 mg/dL — ABNORMAL LOW (ref 8.9–10.3)
GFR calc Af Amer: >60 mL/min (ref >60)
Glucose, Bld: 121 mg/dL — ABNORMAL HIGH (ref 65–99)
POTASSIUM: 3.6 mmol/L (ref 3.5–5.1)
Sodium: 135 mmol/L (ref 135–145)

## 2017-08-09 MED ORDER — MOMETASONE FURO-FORMOTEROL FUM 200-5 MCG/ACT IN AERO
2.0000 | INHALATION_SPRAY | Freq: Two times a day (BID) | RESPIRATORY_TRACT | Status: DC
  Administered 2017-08-09 – 2017-08-11 (×4): 2 via RESPIRATORY_TRACT
  Filled 2017-08-09: qty 8.8

## 2017-08-09 MED ORDER — ALBUTEROL SULFATE (2.5 MG/3ML) 0.083% IN NEBU
INHALATION_SOLUTION | RESPIRATORY_TRACT | Status: AC
  Filled 2017-08-09: qty 6

## 2017-08-09 MED ORDER — ALBUTEROL SULFATE (2.5 MG/3ML) 0.083% IN NEBU
5.0000 mg | INHALATION_SOLUTION | Freq: Once | RESPIRATORY_TRACT | Status: AC
  Administered 2017-08-09: 5 mg via RESPIRATORY_TRACT
  Filled 2017-08-09: qty 6

## 2017-08-09 MED ORDER — METHYLPREDNISOLONE SODIUM SUCC 125 MG IJ SOLR
125.0000 mg | Freq: Once | INTRAMUSCULAR | Status: DC
  Filled 2017-08-09: qty 2

## 2017-08-09 MED ORDER — MELATONIN 3 MG PO TABS
3.0000 mg | ORAL_TABLET | Freq: Every evening | ORAL | Status: DC | PRN
  Administered 2017-08-09 – 2017-08-10 (×2): 3 mg via ORAL
  Filled 2017-08-09 (×4): qty 1

## 2017-08-09 MED ORDER — ACETAMINOPHEN 650 MG RE SUPP: 650.0000 mg | Freq: Four times a day (QID) | RECTAL | Status: DC | PRN

## 2017-08-09 MED ORDER — POLYETHYLENE GLYCOL 3350 17 G PO PACK: 17.0000 g | PACK | Freq: Every day | ORAL | Status: DC | PRN

## 2017-08-09 MED ORDER — ENOXAPARIN SODIUM 40 MG/0.4ML ~~LOC~~ SOLN
40.0000 mg | SUBCUTANEOUS | Status: DC
  Administered 2017-08-09 – 2017-08-10 (×2): 40 mg via SUBCUTANEOUS
  Filled 2017-08-09 (×2): qty 0.4

## 2017-08-09 MED ORDER — IPRATROPIUM-ALBUTEROL 0.5-2.5 (3) MG/3ML IN SOLN
3.0000 mL | RESPIRATORY_TRACT | Status: DC
  Administered 2017-08-09 – 2017-08-11 (×9): 3 mL via RESPIRATORY_TRACT
  Filled 2017-08-09 (×9): qty 3

## 2017-08-09 MED ORDER — ALBUTEROL SULFATE (2.5 MG/3ML) 0.083% IN NEBU
5.0000 mg | INHALATION_SOLUTION | Freq: Once | RESPIRATORY_TRACT | Status: AC
  Administered 2017-08-09: 5 mg via RESPIRATORY_TRACT

## 2017-08-09 MED ORDER — ONDANSETRON HCL 4 MG/2ML IJ SOLN
4.0000 mg | Freq: Four times a day (QID) | INTRAMUSCULAR | Status: DC | PRN
  Administered 2017-08-09: 4 mg via INTRAVENOUS
  Filled 2017-08-09: qty 2

## 2017-08-09 MED ORDER — MONTELUKAST SODIUM 10 MG PO TABS
10.0000 mg | ORAL_TABLET | Freq: Every day | ORAL | Status: DC
  Administered 2017-08-09 – 2017-08-10 (×2): 10 mg via ORAL
  Filled 2017-08-09 (×2): qty 1

## 2017-08-09 MED ORDER — ONDANSETRON HCL 4 MG PO TABS: 4.0000 mg | ORAL_TABLET | Freq: Four times a day (QID) | ORAL | Status: DC | PRN

## 2017-08-09 MED ORDER — ALBUTEROL SULFATE (2.5 MG/3ML) 0.083% IN NEBU
2.5000 mg | INHALATION_SOLUTION | RESPIRATORY_TRACT | Status: DC | PRN
Start: 2017-08-09 — End: 2017-08-11
  Administered 2017-08-09: 2.5 mg via RESPIRATORY_TRACT
  Filled 2017-08-09: qty 3

## 2017-08-09 MED ORDER — DM-GUAIFENESIN ER 30-600 MG PO TB12
1.0000 | ORAL_TABLET | Freq: Two times a day (BID) | ORAL | Status: DC
  Administered 2017-08-09 – 2017-08-11 (×4): 1 via ORAL
  Filled 2017-08-09 (×4): qty 1

## 2017-08-09 MED ORDER — NON FORMULARY: 3.0000 mg | Freq: Every evening | Status: DC | PRN

## 2017-08-09 MED ORDER — PREDNISONE 20 MG PO TABS
60.0000 mg | ORAL_TABLET | Freq: Once | ORAL | Status: AC
Start: 2017-08-09 — End: 2017-08-09
  Administered 2017-08-09: 60 mg via ORAL
  Filled 2017-08-09: qty 3

## 2017-08-09 MED ORDER — PREDNISONE 50 MG PO TABS
60.0000 mg | ORAL_TABLET | Freq: Every day | ORAL | Status: DC
  Administered 2017-08-10 – 2017-08-11 (×2): 60 mg via ORAL
  Filled 2017-08-09 (×2): qty 1

## 2017-08-09 MED ORDER — ACETAMINOPHEN 325 MG PO TABS
650.0000 mg | ORAL_TABLET | Freq: Four times a day (QID) | ORAL | Status: DC | PRN
  Administered 2017-08-09 – 2017-08-11 (×4): 650 mg via ORAL
  Filled 2017-08-09 (×4): qty 2

## 2017-08-09 NOTE — ED Provider Notes (Signed)
[facility] Provider Note   CSN: [id number] Arrival date & time: 08/09/17  [id number]     History   Chief Complaint Chief Complaint  Patient presents with  . Shortness of Breath    HPI Lydia Griffin is a 56 y.o. female.  HPI  56 year old female history of asthma presents today with cough, congestion, and wheezing. She states that symptoms began on Tuesday. She has temp at home to 101. She has a grandchild with similar symptoms and she works in a long term care facility. She has had her flu shot and is not a smoker. She has bronchodilators at home and has used them up to every hour yesterday. She was last on steroids several months ago. She has been hospitalized previously for her asthma.  Past Medical History:  Diagnosis Date  . Asthma    Started as a child; Has been intubated for asthma exacerbation twice in approx 1990  . Shortness of breath    "related to asthma attacks" (09/19/2012)    Patient Active Problem List   Diagnosis Date Noted  . Asthma exacerbation 05/02/2017  . Healthcare maintenance 02/07/2017  . Screening cholesterol level 10/25/2016  . Insomnia 12/30/2012  . Asthma with acute exacerbation 08/15/2011  . Asthma, chronic 11/02/2009  . Overweight(278.02) 12/20/2006  . DEPRESSION, MAJOR, RECURRENT 12/20/2006    Past Surgical History:  Procedure Laterality Date  . hand     flexor tendon repair  . TUBAL LIGATION  ~ 1985    OB History    No data available       Home Medications    Prior to Admission medications   Medication Sig Start Date End Date Taking? Authorizing Provider  albuterol (PROVENTIL HFA;VENTOLIN HFA) 108 (90 Base) MCG/ACT inhaler Inhale 2 puffs into the lungs every 4 (four) hours as needed for wheezing or shortness of breath (cough). 04/06/17   Dolores Pattyiccio, Angela C, DO  albuterol (PROVENTIL) (2.5 MG/3ML) 0.083% nebulizer solution Take 2.5 mg by nebulization every 4 (four) hours as needed for wheezing or  shortness of breath.    [provider]  loratadine (CLARITIN) 10 MG tablet Take 1 tablet (10 mg total) by mouth daily. 02/16/14   Charlane FerrettiMarsh, Melanie C, MD  mometasone-formoterol (DULERA) 200-5 MCG/ACT AERO Inhale 2 puffs into the lungs 2 (two) times daily. 05/03/17   Darin EngelsAbraham, Sherin, DO  montelukast (SINGULAIR) 10 MG tablet Take 1 tablet (10 mg total) by mouth at bedtime. 07/02/17   Tillman Sersiccio, Angela C, DO    Family History Family History  Problem Relation Age of Onset  . Asthma Mother   . Heart disease Father [phone]       MI  . Alcohol abuse Father   . Asthma Sister 9       death from asthma    Social History Social History  Substance Use Topics  . Smoking status: Never Smoker  . Smokeless tobacco: Never Used  . Alcohol use No     Allergies   Patient has no known allergies.   Review of Systems Review of Systems  Constitutional: Positive for appetite change, chills, fatigue and fever.  HENT: Positive for congestion.   Eyes: Negative.   Respiratory: Positive for cough, chest tightness and shortness of breath.   Cardiovascular: Negative.   Gastrointestinal: Negative.   Endocrine: Negative.   Genitourinary: Negative.   Musculoskeletal: Positive for back pain.  Allergic/Immunologic: Negative.   Neurological: Negative.   Hematological: Negative.   Psychiatric/Behavioral: Negative.  All other systems reviewed and are negative.    Physical Exam Updated Vital Signs BP (!) 143/80   Pulse 75   Temp 98.8 F (37.1 C) (Oral)   Resp (!) 26   Ht 1.676 m (5\' 6" )   Wt 83.9 kg (185 lb)   SpO2 97%   BMI 29.86 kg/m   Physical Exam   ED Treatments / Results  Labs (all labs ordered are listed, but only abnormal results are displayed) Labs Reviewed - No data to display  EKG  EKG Interpretation None       Radiology No results found.  Procedures Procedures (including critical care time)  Medications Ordered in ED Medications  albuterol (PROVENTIL) (2.5 MG/3ML)  0.083% nebulizer solution (  Not Given 08/09/17 0731)  albuterol (PROVENTIL) (2.5 MG/3ML) 0.083% nebulizer solution 5 mg (5 mg Nebulization Given 08/09/17 0731)     Initial Impression / Assessment and Plan / ED Course  I have reviewed the triage vital signs and the nursing notes.  Pertinent labs & imaging results that were available during my care of the patient were reviewed by me and considered in my medical decision making (see chart for details).    Albuterol 5 mg neb x 3, prednisone 60 mg po 11:02 AM Continues with expiratory wheezes, some increased rr, and sats 95%  Discussed with Dr. Artist Pais, on for FP, and she will see. Labs ordered. Final Clinical Impressions(s) / ED Diagnoses   Final diagnoses:  Moderate persistent asthma with exacerbation    New Prescriptions New Prescriptions   No medications on file     Margarita Grizzle, MD 08/09/17 1130

## 2017-08-09 NOTE — ED Notes (Signed)
PT taken a sandwich and crackers. A meal is also ordered for PT at this time.

## 2017-08-09 NOTE — ED Triage Notes (Signed)
Pt c/o wheezing started yesterday--pt has audible wheezes, unable to speak in complete sentences. Cough productive for green sputum. Pt admits to having a fever last night of 101, works in long term care.

## 2017-08-09 NOTE — H&P (Signed)
Family Medicine Teaching [facility] Admission History and Physical Service Pager: [phone]  Patient name: Lydia Griffin Medical record number: [id number] Date of birth: [birth year] Age: 56 y.o. Gender: female  Primary Care Provider: Tillman Sers, DO Consultants: none Code Status: Full  Chief Complaint: shortness of breath  Assessment and Plan: Lydia Griffin is a 57 y.o. female presenting with cough, congestion, shortness of breath . PMH is significant for asthma.  Shortness of breath 2/2 asthma exacerbation: In setting of likely viral URI given cough and nasal congestion for the past 3 days. Patient is afebrile on admission, no leukocytosis and CXR does not show focal consolidation so unlikely to be pneumonia. Received 3 albuterol nebulizer treatments in the ED and prednisone with some relief but on admission continues to have diffuse wheezing and increased work of breathing. Patient has a history of requiring intubation for asthma exacerbation so will observe overnight. RR 17-26 and maintaining O2 sat ~96% on room air. - Place in observation, attending Dr. Leveda Anna - monitor with continuous pulse oximetry - Continue home dulera but increase to 2 puffs twice a day - continue home singular - duonebs q4h scheduled - albuterol neb q2prn - mucinex q12h scheduled - keep O2 sat > 94% - monitor off ABX - follow up on BMP drawn on admit - prednisone 60mg  daily, may need prolonged steroid taper  Palpitations. Likely 2/2 albuterol use. Patient has no chest pain and EKG on admit shows no acute ST changes. HR in 80s. - monitor vital signs  Nausea. Mild per patient report, no vomiting.  - prn zofran   FEN/GI: regular diet Prophylaxis: lovenox  Disposition: placed in observation  History of Present Illness:  Lydia Griffin is a 56 y.o. female presenting with cough, congestion and shortness of breath.  States started with cough and congestion on Tuesday that progressively worsened  over the last few days. Started becoming increasingly short of breath with wheezing until she found it difficult to walk or perform any of her ADLs. Has needed her home albuterol inhaler and nebulizer treatments almost hourly. Has been compliant on her home dulera but only 1 puff twice a day and her home singular. 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 States her cough is productive with greenish sputum. Has had at fever at home with Tmax 101.58F. Feels a little nauseous but no vomiting. Denies chest pain but does endorse some chest wall tenderness whenever she coughs. Does feel her heart beating fast intermittently ever since she started needing her breathing treatments hourly.    Review Of Systems: Per HPI with the following additions:   Review of Systems  Constitutional: Positive for fever. Negative for chills and diaphoresis.  HENT: Positive for congestion and tinnitus (chronic). Negative for ear pain and sore throat.   Respiratory: Positive for cough, sputum production, shortness of breath and wheezing. Negative for hemoptysis.   Cardiovascular: Positive for palpitations. Negative for chest pain.  Gastrointestinal: Positive for nausea. Negative for abdominal pain, constipation, diarrhea and vomiting.  Genitourinary: Negative for dysuria, frequency and urgency.  Musculoskeletal: Negative for myalgias.  Skin: Negative for rash.  Neurological: Negative for dizziness and headaches.    Patient Active Problem List   Diagnosis Date Noted  . Asthma exacerbation 05/02/2017  . Healthcare maintenance 02/07/2017  . Screening cholesterol level 10/25/2016  . Insomnia 12/30/2012  . Asthma with acute exacerbation 08/15/2011  . Asthma, chronic 11/02/2009  . Overweight(278.02) 12/20/2006  . DEPRESSION, MAJOR, RECURRENT 12/20/2006  Past Medical History: Past Medical History:  Diagnosis Date  . Asthma    Started as a child; Has been intubated for asthma exacerbation twice in  approx 1990  . Shortness of breath    "related to asthma attacks" (09/19/2012)    Past Surgical History: Past Surgical History:  Procedure Laterality Date  . hand     flexor tendon repair  . TUBAL LIGATION  ~ 1985    Social History: Social History  Substance Use Topics  . Smoking status: Never Smoker  . Smokeless tobacco: Never Used  . Alcohol use No   Additional social history: Lives at home with her mother. No recreational drug use.  Please also refer to relevant sections of EMR.  Family History: Family History  Problem Relation Age of Onset  . Asthma Mother   . Heart disease Father [phone]       MI  . Alcohol abuse Father   . Asthma Sister 9       death from asthma    Allergies and Medications: No Known Allergies No current facility-administered medications on file prior to encounter.    Current Outpatient Prescriptions on File Prior to Encounter  Medication Sig Dispense Refill  . albuterol (PROVENTIL HFA;VENTOLIN HFA) 108 (90 Base) MCG/ACT inhaler Inhale 2 puffs into the lungs every 4 (four) hours as needed for wheezing or shortness of breath (cough). 1 Inhaler 3  . albuterol (PROVENTIL) (2.5 MG/3ML) 0.083% nebulizer solution Take 2.5 mg by nebulization every 4 (four) hours as needed for wheezing or shortness of breath.    . loratadine (CLARITIN) 10 MG tablet Take 1 tablet (10 mg total) by mouth daily. 30 tablet 0  . mometasone-formoterol (DULERA) 200-5 MCG/ACT AERO Inhale 2 puffs into the lungs 2 (two) times daily. 1 Inhaler 0  . montelukast (SINGULAIR) 10 MG tablet Take 1 tablet (10 mg total) by mouth at bedtime. 90 tablet 3    Objective: BP 106/61   Pulse 84   Temp 98.8 F (37.1 C) (Oral)   Resp 20   Ht 5\' 6"  (1.676 m)   Wt 185 lb (83.9 kg)   SpO2 96%   BMI 29.86 kg/m  Exam: General: Sitting up in bed, in NAD  Eyes: PERRL, EOMI, conjunctiva nonerythematous ENTM: MMM. Small whitehead on L nare. Neck: supple, no cervical lymphadenopathy Cardiovascular:  RRR, no murmurs Respiratory: mildly increased work of breathing on room air with belly breathing but no subcostal retractions, diffuse rhonchi and wheezes. Gastrointestinal: soft, nontender, nondistended, + bowel sounds MSK: moving limbs spontaneously, no LE edema  Derm: warm and dry, <3 sec cap refill Neuro: alert and oriented, no focal deficits Psych: appropriate mood and affect   Labs and Imaging: CBC BMET   Recent Labs Lab 08/09/17 1143  WBC 8.5  HGB 12.3  HCT 37.1  PLT 268   No results for input(s): NA, K, CL, CO2, BUN, CREATININE, GLUCOSE, CALCIUM in the last 168 hours.   EKG: no acute ST changes  Dg Chest 2 View  Result Date: 08/09/2017 CLINICAL DATA:  Shortness of breath for 2 days, history of asthma EXAM: CHEST  2 VIEW COMPARISON:  05/02/2017 FINDINGS: Cardiac shadow is stable. The lungs are mildly hyperinflated. Mild interstitial changes are again identified and stable. The patchy nodular changes in the left apex are no longer identified. No sizable effusion is seen. No bony abnormality is noted. IMPRESSION: Chronic changes without acute abnormality. Electronically Signed   By: Alcide CleverMark  Lukens M.D.   On: 08/09/2017  09:01    Leland Her, DO 08/09/2017, 11:30 AM PGY-2, [facility] FPTS Intern pager: [phone], text pages welcome

## 2017-08-09 NOTE — ED Notes (Signed)
Pt ambulatory to restroom

## 2017-08-09 NOTE — ED Notes (Signed)
Attempted IV X2 without success.

## 2017-08-10 LAB — BASIC METABOLIC PANEL
Anion gap: 10 (ref 5–15)
BUN: 6 mg/dL (ref 6–20)
CALCIUM: 8.9 mg/dL (ref 8.9–10.3)
CO2: 23 mmol/L (ref 22–32)
CREATININE: 0.7 mg/dL (ref 0.44–1.00)
Chloride: 105 mmol/L (ref 101–111)
GFR calc Af Amer: >60 mL/min (ref >60)
GLUCOSE: 102 mg/dL — AB (ref 65–99)
Potassium: 3.3 mmol/L — ABNORMAL LOW (ref 3.5–5.1)
SODIUM: 138 mmol/L (ref 135–145)

## 2017-08-10 LAB — CBC
HCT: 35.9 % — ABNORMAL LOW (ref 36.0–46.0)
Hemoglobin: 12.2 g/dL (ref 12.0–15.0)
MCH: 28 pg (ref 26.0–34.0)
MCHC: 34 g/dL (ref 30.0–36.0)
MCV: 82.5 fL (ref 78.0–100.0)
PLATELETS: 281 10*3/uL (ref 150–400)
RBC: 4.35 MIL/uL (ref 3.87–5.11)
RDW: 15 % (ref 11.5–15.5)
WBC: 10.2 10*3/uL (ref 4.0–10.5)

## 2017-08-10 LAB — HIV ANTIBODY (ROUTINE TESTING W REFLEX): HIV SCREEN 4TH GENERATION: NONREACTIVE

## 2017-08-10 MED ORDER — WHITE PETROLATUM EX OINT
TOPICAL_OINTMENT | CUTANEOUS | Status: AC
  Administered 2017-08-10: 12:00:00
  Filled 2017-08-10: qty 28.35

## 2017-08-10 NOTE — Progress Notes (Signed)
Family Medicine Teaching Service Daily Progress Note Intern Pager: [phone]  Patient name: Lydia Griffin Medical record number: [id number] Date of birth: 04-16-1961 Age: 56 y.o. Gender: female  Primary Care Provider: Tillman Sersiccio, Angela C, DO Consultants: none Code Status: full   Pt Overview and Major Events to Date:  Admitted to FPTS on 08/09/2017  Assessment and Plan: [name] a [age] y.o.femalepresenting with cough, congestion, shortness of breath. PMH is significant for asthma.  Shortness of breath 2/2 asthma exacerbation In setting of likely viral URI, which is patient's normal trigger for asthma exacerbation, given cough and nasal congestion for the past 3 days. Patient remains afebrile without leukocytosis. CXR on admission did not show focal consolidation so unlikely to be pneumonia. Patient today notes some improvement, with only slight dyspnea on exertion. Patient has a history of requiring intubation for asthma exacerbation. Current home medications include prn albuterol, dulera (however patient is only taking 1 puff instead of 2), and singulair. RR 18 and maintaining O2 sat 96% on room air. - monitor with continuous pulse oximetry - Continue home dulera but increase to 2 puffs twice a day - continue home singular - duonebs q4h scheduled - albuterol neb q2prn - mucinex q12h scheduled - keep O2 sat > 94% - prednisone 60mg  daily, may need prolonged steroid taper - monitor O2 when ambulating   Palpitations Likely 2/2 albuterol use. Patient has no chest pain and EKG on admit shows no acute ST changes. Currently HR 71.  - monitor vital signs  Nausea Mild per patient report, no vomiting. Patient reports likely 2/2 increased mucous drainage.  - prn zofran  Bump Patient is afebrile.  -will need outpatient follow up  -bactroban to affected area  -warm compresses as needed  FEN/GI: regular diet Prophylaxis: lovenox  Disposition: to home   Subjective:  Patient  today notes significant improvement. States she has slight SOB when ambulating throughout room but in NAD. Patient reports some cough with green sputum production. States she has a "bump" inside her left nostril that is swollen.   Objective: Temp:  [98 F (36.7 Griffin)-98.5 F (36.9 Griffin)] 98.5 F (36.9 Griffin) (10/20 0520) Pulse Rate:  [70-75] 75 (10/20 0520) Resp:  [18-24] 19 (10/20 0520) BP: (115-131)/(65-74) 115/67 (10/20 0520) SpO2:  [97 %-99 %] 97 % (10/20 0520) Physical Exam: General: awake and alert, NAD Cardiovascular: RRR, no MRG Respiratory: very minimal expiratory wheezes bilaterally, no rales, or rhonchi  Abdomen: soft, non tender, non distended, bowel sounds  Extremities: no edema, non tender Derm: small, tender, erythematous bump inside left nostril   Laboratory:  Recent Labs Lab 08/09/17 1143 08/10/17 0401  WBC 8.5 10.2  HGB 12.3 12.2  HCT 37.1 35.9*  PLT 268 281    Recent Labs Lab 08/09/17 1143 08/10/17 0401  NA 135 138  K 3.6 3.3*  CL 102 105  CO2 24 23  BUN 6 6  CREATININE 0.86 0.70  CALCIUM 8.6* 8.9  GLUCOSE 121* 102*   Imaging/Diagnostic Tests: Dg Chest 2 View  Result Date: 08/09/2017 CLINICAL DATA:  Shortness of breath for 2 days, history of asthma EXAM: CHEST  2 VIEW COMPARISON:  05/02/2017 FINDINGS: Cardiac shadow is stable. The lungs are mildly hyperinflated. Mild interstitial changes are again identified and stable. The patchy nodular changes in the left apex are no longer identified. No sizable effusion is seen. No bony abnormality is noted. IMPRESSION: Chronic changes without acute abnormality. Electronically Signed   By: Alcide CleverMark  Lukens M.D.   On:  08/09/2017 09:01    Oralia Manis, DO 08/11/2017, 8:14 AM PGY-1, [facility] [facility] FPTS Intern pager: [phone], text pages welcome

## 2017-08-10 NOTE — Progress Notes (Signed)
Family Medicine Teaching Service Daily Progress Note Intern Pager: [phone]  Patient name: Lydia Griffin Medical record number: [id number] Date of birth: 1961/02/18 Age: 56 y.o. Gender: female  Primary Care Provider: Tillman Sers, DO Consultants: none Code Status: full   Pt Overview and Major Events to Date:  Admitted to FPTS on 08/09/2017  Assessment and Plan: Lydia Griffin is a 56 y.o. female presenting with cough, congestion, shortness of breath . PMH is significant for asthma.  Shortness of breath 2/2 asthma exacerbation In setting of likely viral URI, which is patient's normal trigger, given cough and nasal congestion for the past 3 days. Patient remains afebrile without leukocytosis. CXR on admission did not show focal consolidation so unlikely to be pneumonia. Patient today notes some improvement, but has dyspnea on exertion most noted when trying to take a shower. Patient has a history of requiring intubation for asthma exacerbation. Current home medications include prn albuterol, dulera (however patient is only taking 1 puff instead of 2), and singulair. RR 18 and maintaining O2 sat 96% on room air. - monitor with continuous pulse oximetry - Continue home dulera but increase to 2 puffs twice a day - continue home singular - duonebs q4h scheduled - albuterol neb q2prn - mucinex q12h scheduled - keep O2 sat > 94% - prednisone 60mg  daily, may need prolonged steroid taper - monitor O2 when ambulating   Palpitations Likely 2/2 albuterol use. Patient has no chest pain and EKG on admit shows no acute ST changes. Currently HR 71.  - monitor vital signs  Nausea Mild per patient report, no vomiting.  - prn zofran  FEN/GI: regular diet Prophylaxis: lovenox  Disposition: to home   Subjective:  Patient today reports improvement, states she is 65% of baseline. Patient reports some difficulty when ambulating. States she notices shortness of breath when attempting to take  a shower. Patient states some cough. Denies nausea or palpitations. Patient states she would like to reassess discharge this afternoon. Patient states that her normal triggers for asthma are viral URI symptoms.   Objective: Temp:  [98.3 F (36.8 C)-98.4 F (36.9 C)] 98.3 F (36.8 C) (10/19 0555) Pulse Rate:  [66-105] 71 (10/19 0555) Resp:  [17-24] 18 (10/19 0555) BP: (103-139)/(52-91) 103/52 (10/19 0555) SpO2:  [91 %-100 %] 98 % (10/19 1051) Physical Exam: General: awake and alert, sitting up in bed, NAD Cardiovascular: RRR, no MRG Respiratory: expiratory wheezing heard throughout, no increased work of breathing, no retractions noted  Abdomen: soft, non tender, non distended, bowel sounds normal  Extremities: no edema, non tender   Laboratory:  Recent Labs Lab 08/09/17 1143 08/10/17 0401  WBC 8.5 10.2  HGB 12.3 12.2  HCT 37.1 35.9*  PLT 268 281    Recent Labs Lab 08/09/17 1143 08/10/17 0401  NA 135 138  K 3.6 3.3*  CL 102 105  CO2 24 23  BUN 6 6  CREATININE 0.86 0.70  CALCIUM 8.6* 8.9  GLUCOSE 121* 102*    Imaging/Diagnostic Tests: Dg Chest 2 View  Result Date: 08/09/2017 CLINICAL DATA:  Shortness of breath for 2 days, history of asthma EXAM: CHEST  2 VIEW COMPARISON:  05/02/2017 FINDINGS: Cardiac shadow is stable. The lungs are mildly hyperinflated. Mild interstitial changes are again identified and stable. The patchy nodular changes in the left apex are no longer identified. No sizable effusion is seen. No bony abnormality is noted. IMPRESSION: Chronic changes without acute abnormality. Electronically Signed   By: Eulah Pont.D.  On: 08/09/2017 09:01    Oralia Manis, DO 08/10/2017, 11:25 AM PGY-1, [facility] FPTS Intern pager: [phone], text pages welcome

## 2017-08-10 NOTE — Progress Notes (Signed)
Patient arrived to the unit via bed from the emergency department.  Patient is alert and oriented x 4. Patient ambulatory.  No complaints of pain. Skin assessment complete. IV intact to the left thumb. Educated the patient on how to reach the staff on the unit.  Lowered the bed and placed the call light within reach.  Will continue to monitor the patient.

## 2017-08-10 NOTE — Discharge Summary (Signed)
Family Medicine Teaching [facility] Discharge Summary  Patient name: Lydia Griffin Medical record number: [id number] Date of birth: 09/20/1961 Age: 56 y.o. Gender: female Date of Admission: 08/09/2017  Date of Discharge: 08/11/2017 Admitting Physician: Moses Manners, MD  Primary Care Provider: Tillman Sers, DO Consultants: None  Indication for Hospitalization: SOB  Discharge Diagnoses/Problem List:  SOB 2/2 asthma exacerbation Palpitations Nausea  Disposition: to home  Discharge Condition: stable, improving   Discharge Exam:  Please see progress note from date of discharge   Brief Hospital Course:  Lydia LEVEQUE is a 56 y.o. female presenting for SOB likely 2/2 asthma exacerbation brought on following viral URI. Patient states she has had progressive worsening of cough and congestion since Tuesday and has become increasingly SOB to the point that it was difficult to perform any of her ADLs. Patient has had to take her home prn albuterol inhaler and nebulizer treatments almost hourly. 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 Patient has been complaint on home asthma medications, however was taking her dulera as only 1 puff instead of 2. Patient had a home temperature of 101F but has been afebrile while inpatient. Patient also noted palpitations but this is likely 2/2 increased albuterol use at home as EKG on admission showed no acute ST changes. . While inpatient, CXR did not show signs of pneumonia. Patient was placed on scheduled duonebs, mucinex, and prednisone and showed improvement. Patient continued to improved and was agreeable to discharge.    Issues for Follow Up:  1. Compliance with home medication doses: dulera 2 puffs bid, singulair 10mg  daily at bedtime, albuterol prn  2. Improvement of viral URI symptoms 3. Monitor chest palpitations, consider further EKGs or cardiac follow up if continues without increased albuterol dosing  4. Prednisone  taper course  5. Follow up on bump inside left nostril   Significant Procedures: none   Significant Labs and Imaging:   Recent Labs Lab 08/09/17 1143 08/10/17 0401  WBC 8.5 10.2  HGB 12.3 12.2  HCT 37.1 35.9*  PLT 268 281    Recent Labs Lab 08/09/17 1143 08/10/17 0401  NA 135 138  K 3.6 3.3*  CL 102 105  CO2 24 23  GLUCOSE 121* 102*  BUN 6 6  CREATININE 0.86 0.70  CALCIUM 8.6* 8.9    Dg Chest 2 View  Result Date: 08/09/2017 CLINICAL DATA:  Shortness of breath for 2 days, history of asthma EXAM: CHEST  2 VIEW COMPARISON:  05/02/2017 FINDINGS: Cardiac shadow is stable. The lungs are mildly hyperinflated. Mild interstitial changes are again identified and stable. The patchy nodular changes in the left apex are no longer identified. No sizable effusion is seen. No bony abnormality is noted. IMPRESSION: Chronic changes without acute abnormality. Electronically Signed   By: Alcide Clever M.D.   On: 08/09/2017 09:01     Results/Tests Pending at Time of Discharge:  Unresulted Labs    None     Discharge Medications:  Allergies as of 08/11/2017   No Known Allergies     Medication List    TAKE these medications   albuterol (2.5 MG/3ML) 0.083% nebulizer solution Commonly known as:  PROVENTIL Take 2.5 mg by nebulization every 4 (four) hours as needed for wheezing or shortness of breath.   albuterol 108 (90 Base) MCG/ACT inhaler Commonly known as:  PROVENTIL HFA;VENTOLIN HFA Inhale 2 puffs into the lungs every 4 (four) hours as needed for wheezing or shortness of breath (  cough).   Biotin 1 MG Caps Take 1 mg by mouth daily.   loratadine 10 MG tablet Commonly known as:  CLARITIN Take 1 tablet (10 mg total) by mouth daily.   mometasone-formoterol 200-5 MCG/ACT Aero Commonly known as:  DULERA Inhale 2 puffs into the lungs 2 (two) times daily.   montelukast 10 MG tablet Commonly known as:  SINGULAIR Take 1 tablet (10 mg total) by mouth at bedtime.   multivitamin  with minerals tablet Take 1 tablet by mouth daily.   mupirocin ointment 2 % Commonly known as:  BACTROBAN Place into the nose 2 (two) times daily.   predniSONE 20 MG tablet Commonly known as:  DELTASONE Take 3 tablets (60 mg total) by mouth daily with breakfast.   VITAMIN B 12 PO Take 1,000 mcg by mouth daily.       Discharge Instructions: Please refer to Patient Instructions section of EMR for full details.  Patient was counseled important signs and symptoms that should prompt return to medical care, changes in medications, dietary instructions, activity restrictions, and follow up appointments.   Follow-Up Appointments: Follow-up Information    Tillman Sers, DO. Go on 08/13/2017.   Specialty:  Family Medicine Why:  @ 1:55 pm (please arrive 15 min early) Contact information: [address] [place] [address] [phone]           Oralia Manis, DO 08/11/2017, 8:30 AM PGY-1, [facility]

## 2017-08-11 DIAGNOSIS — J4541 Moderate persistent asthma with (acute) exacerbation: Secondary | ICD-10-CM

## 2017-08-11 MED ORDER — PREDNISONE 20 MG PO TABS: 60.0000 mg | ORAL_TABLET | Freq: Every day | ORAL | 0 refills | Status: AC

## 2017-08-11 MED ORDER — MUPIROCIN 2 % EX OINT
TOPICAL_OINTMENT | Freq: Two times a day (BID) | CUTANEOUS | Status: DC
  Administered 2017-08-11: 10:00:00 via NASAL
  Filled 2017-08-11: qty 22

## 2017-08-11 MED ORDER — MUPIROCIN 2 % EX OINT: TOPICAL_OINTMENT | Freq: Two times a day (BID) | CUTANEOUS | 0 refills | Status: DC

## 2017-08-11 NOTE — Progress Notes (Signed)
NURSING PROGRESS NOTE  Lydia Griffin [place] [id number] Discharge Data: 08/11/2017 10:50 AM Attending Provider: Moses MannersHensel, William A, MD WUJ:[id number]:Riccio, Marcell AngerAngela C, DO   Lydia Griffin Raggio to be D/C'd Home per MD order.    All IV's will be discontinued and monitored for bleeding.  All belongings will be returned to patient for patient to take home.  Last Documented Vital Signs:  Blood pressure 115/67, pulse 75, temperature 98.5 F (36.9 C), temperature source Oral, resp. rate 19, height 5\' 6"  (1.676 m), weight 83.9 kg (185 lb), SpO2 97 %.  Madelin RearLonnie Tyera Hansley, MSN, RN, [employer]RN

## 2017-08-11 NOTE — Discharge Instructions (Signed)
You were admitted for an asthma exacerbation. This likely was secondary to a viral illness which caused some of your coughing and congestion. On discharge please take dulera 2 puffs twice a day, singulair 10mg  daily at bedtime, and use your albuterol as needed.   You were started on steroids in the hospital. Please continue your prednisone as follows: 60 mg x 7 days  Please follow up with your PCP Dr. Wonda Oldsiccio on 10/22 @ 1:55pm, please arrive 15 min early.   If you develop symptoms of fever, shortness of breath, chest pain, or continued palpitations please call your PCP immediately or come to the emergency room.

## 2017-08-11 NOTE — Progress Notes (Signed)
SATURATION QUALIFICATIONS: (This note is used to comply with regulatory documentation for home oxygen)  Patient Saturations on Room Air at Rest = 98%  Patient Saturations on Room Air while Ambulating = 95%  Patient Saturations on na Liters of oxygen while Ambulating = na%  Please briefly explain why patient needs home oxygen: does not need

## 2017-08-13 ENCOUNTER — Ambulatory Visit (HOSPITAL_COMMUNITY): Admission: RE | Admit: 2017-08-13 | Payer: 59 | Source: Ambulatory Visit

## 2017-08-13 ENCOUNTER — Encounter: Payer: Self-pay | Admitting: Family Medicine

## 2017-08-13 ENCOUNTER — Ambulatory Visit (INDEPENDENT_AMBULATORY_CARE_PROVIDER_SITE_OTHER): Payer: Self-pay | Admitting: Family Medicine

## 2017-08-13 VITALS — BP 122/64 | HR 79 | Temp 98.1°F | Wt 183.0 lb

## 2017-08-13 DIAGNOSIS — J4550 Severe persistent asthma, uncomplicated: Secondary | ICD-10-CM

## 2017-08-13 DIAGNOSIS — J340 Abscess, furuncle and carbuncle of nose: Secondary | ICD-10-CM | POA: Insufficient documentation

## 2017-08-13 MED ORDER — SULFAMETHOXAZOLE-TRIMETHOPRIM 800-160 MG PO TABS: 1.0000 | ORAL_TABLET | Freq: Two times a day (BID) | ORAL | 0 refills | Status: AC

## 2017-08-13 NOTE — Progress Notes (Signed)
    Subjective:    Patient ID: Lydia Griffin, female    DOB: [birth year], 56 y.o.   MRN: [id number]   CC: hosp fu  Asthma- reports breathing is much improved, taking medications as prescribed. Denies SOB, wheezing, cough. Has a few days of steroids left.   Nose abscess- had fever to 101 last night. Area has swollen under her eye and around her mouth. Painful to breathe from nose. Painful to open mouth too wide. Also endorses headaches and photophobia last night, none currently. Denies focal weakness, neck stiffness, vision changes, confusion. No current fever or chills.Taking ibuprofen for pain with relief. She has not tried to pop area as it is too tender. It has not drained on its own.  Smoking status reviewed- non-smoker  Review of Systems- see HPI   Objective:  BP 122/64   Pulse 79   Temp 98.1 F (36.7 C) (Oral)   Wt 183 lb (83 kg)   SpO2 97%   BMI 29.54 kg/m  Vitals and nursing note reviewed  General: well nourished, in no acute distress HEENT: normocephalic, no scleral icterus or conjunctival pallor, no nasal discharge but large tender erythematous papule inside left nostril, no fluctuance appreciated. Moist mucous membranes, poor dentition Neck: supple, non-tender, without lymphadenopathy Cardiac: RRR, clear S1 and S2, no murmurs, rubs, or gallops Respiratory: clear to auscultation bilaterally, no increased work of breathing Extremities: no edema or cyanosis. Skin: warm and dry, no rashes noted Neuro: alert and oriented, no focal deficits  Assessment & Plan:    Asthma, chronic  History of poorly controlled asthma, discharged from hospital 10/20, currently without exacerbation  -continue current regimen -complete course of steroids -follow up as needed  Nasal abscess  Acute in left nostril, currently without clear area to drain so did not pursue I&D in clinic. Concern as this area can cause infection to spread to brain. Discussed risks with patient. She is well  appearing at this time.  -continue warm compresses -rx for Bactrim given, instructed to start this afternoon -follow up Friday for possible drainage if it has not done so spontaneously in past    Return in about 4 days (around 08/17/2017).   Dolores PattyAngela Riccio, DO Family Medicine Resident PGY-2

## 2017-08-13 NOTE — Assessment & Plan Note (Signed)
  History of poorly controlled asthma, discharged from hospital 10/20, currently without exacerbation  -continue current regimen -complete course of steroids -follow up as needed

## 2017-08-13 NOTE — Assessment & Plan Note (Signed)
  Acute in left nostril, currently without clear area to drain so did not pursue I&D in clinic. Concern as this area can cause infection to spread to brain. Discussed risks with patient. She is well appearing at this time.  -continue warm compresses -rx for Bactrim given, instructed to start this afternoon -follow up Friday for possible drainage if it has not done so spontaneously in past

## 2017-08-13 NOTE — Patient Instructions (Signed)
   It was great seeing you today! Please start taking Bactrim twice a day for 7 days.  Please go to the ED immediately for fever, worsening headache, stiff neck, trouble seeing.   I'll see you Friday but call if you have any questions or concerns at [phone].  Dolores PattyAngela Roshini Fulwider, DO PGY-2, [facility] 08/13/2017 2:20 PM

## 2017-08-17 ENCOUNTER — Telehealth: Payer: Self-pay | Admitting: *Deleted

## 2017-08-17 ENCOUNTER — Ambulatory Visit: Payer: Self-pay | Admitting: Family Medicine

## 2017-08-17 NOTE — Telephone Encounter (Signed)
Patient left message on nurse line apologizing for missing today's appt. 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 States nasal abscess has burst and now only a little sore. No fever. Will r/s appt if provider thinks that's necessary. Kinnie FeilL. Ducatte, RN, BSN

## 2017-08-17 NOTE — Telephone Encounter (Signed)
Great thank you!

## 2017-10-18 ENCOUNTER — Other Ambulatory Visit: Payer: Self-pay | Admitting: *Deleted

## 2017-10-18 DIAGNOSIS — J4541 Moderate persistent asthma with (acute) exacerbation: Secondary | ICD-10-CM

## 2017-10-18 NOTE — Telephone Encounter (Signed)
Patient will also need form filled out to receive Proventil directly from [employer]. Last done 09/22/2016. Patient will get more information to us. Kinnie FeilL. Ducatte, RN, BSN

## 2017-10-19 MED ORDER — ALBUTEROL SULFATE (2.5 MG/3ML) 0.083% IN NEBU
2.5000 mg | INHALATION_SOLUTION | RESPIRATORY_TRACT | 5 refills | Status: DC | PRN
Start: 2017-10-19 — End: 2021-01-24

## 2017-10-19 MED ORDER — ALBUTEROL SULFATE HFA 108 (90 BASE) MCG/ACT IN AERS: 2.0000 | INHALATION_SPRAY | RESPIRATORY_TRACT | 3 refills | Status: DC | PRN

## 2017-11-15 ENCOUNTER — Other Ambulatory Visit: Payer: Self-pay | Admitting: Family Medicine

## 2017-11-15 DIAGNOSIS — J4541 Moderate persistent asthma with (acute) exacerbation: Secondary | ICD-10-CM

## 2017-11-15 NOTE — Telephone Encounter (Signed)
Will forward to MD as these scripts will need to be printed and faxed. Bethzaida Boord,CMA

## 2017-11-15 NOTE — Telephone Encounter (Signed)
Pt needs dr to send refills for provential and dulera to the [employer] program. [facility]  [phone] is the contact information.

## 2017-11-16 NOTE — Telephone Encounter (Signed)
Ok, thank you for letting me know. I know her asthma is very severe and it sounds like an ED visit would be more appropriate as she has been intubated in the past.

## 2017-11-16 NOTE — Telephone Encounter (Signed)
I called Ms. Lillo to ask which pharmacy exactly we were faxing these scripts to and she informed me that she really needed to be seen today.  I was unable to get correct pharmacy information but she did state that she seems to have caught "some kind of bug" and her asthma is very flared up.  I offered her an overflow appt today and explained to her how the process works.  Patient stated that she was seek care at an urgent care or the ED as she would have to wait there too.  I asked patient to call me back on Monday so we can get her prescriptions figured out and where they needed to go.  Patient did sound like she was in distress and was coughing up a lot of mucus.  Will forward to MD to make her aware of this. Jaimin Krupka,CMA

## 2017-11-16 NOTE — Telephone Encounter (Signed)
Jazmin can we make sure this gets to the proper pharmacy? Thank you!

## 2017-11-20 NOTE — Telephone Encounter (Signed)
I haven't seen any follow up charts about Lydia Griffin going to hospital or anything. I'm happy to send in these prescriptions for her just need to know where to send them. Thanks!

## 2017-11-21 MED ORDER — ALBUTEROL SULFATE HFA 108 (90 BASE) MCG/ACT IN AERS: 2.0000 | INHALATION_SPRAY | RESPIRATORY_TRACT | 11 refills | Status: DC | PRN

## 2017-11-21 MED ORDER — MOMETASONE FURO-FORMOTEROL FUM 200-5 MCG/ACT IN AERO: 2.0000 | INHALATION_SPRAY | Freq: Two times a day (BID) | RESPIRATORY_TRACT | 11 refills | Status: DC

## 2017-11-21 NOTE — Telephone Encounter (Signed)
LM for patient to call back and verify which pharmacy she needs scripts to go to.  Unsure of what [facility].  Also asked patient if there is a local pharmacy that she would like to use in the meantime.  Jazmin Hartsell,CMA

## 2017-11-21 NOTE — Telephone Encounter (Signed)
Patient left message on nurse line requesting refills for proventil and dulera be sent to [facility]. Ph is [phone] and fax [phone]. Kinnie FeilL. Ducatte, RN, BSN

## 2017-11-21 NOTE — Telephone Encounter (Signed)
Scripts printed and placed in the fax pile. Jazmin Hartsell,CMA

## 2018-02-28 ENCOUNTER — Encounter: Payer: Self-pay | Admitting: Family Medicine

## 2018-02-28 ENCOUNTER — Ambulatory Visit (INDEPENDENT_AMBULATORY_CARE_PROVIDER_SITE_OTHER): Payer: Self-pay | Admitting: Family Medicine

## 2018-02-28 ENCOUNTER — Other Ambulatory Visit: Payer: Self-pay

## 2018-02-28 VITALS — BP 110/64 | HR 77 | Temp 98.0°F | Ht 66.0 in | Wt 168.2 lb

## 2018-02-28 DIAGNOSIS — J4541 Moderate persistent asthma with (acute) exacerbation: Secondary | ICD-10-CM

## 2018-02-28 MED ORDER — METHYLPREDNISOLONE ACETATE 80 MG/ML IJ SUSP
80.0000 mg | Freq: Once | INTRAMUSCULAR | Status: AC
  Administered 2018-02-28: 80 mg via INTRAMUSCULAR

## 2018-02-28 MED ORDER — PREDNISONE 50 MG PO TABS: ORAL_TABLET | ORAL | 0 refills | Status: DC

## 2018-02-28 MED ORDER — IPRATROPIUM BROMIDE 0.02 % IN SOLN
0.5000 mg | Freq: Once | RESPIRATORY_TRACT | Status: AC
  Administered 2018-02-28: 0.5 mg via RESPIRATORY_TRACT

## 2018-02-28 MED ORDER — FLUTICASONE PROPIONATE 50 MCG/ACT NA SUSP: 2.0000 | Freq: Every day | NASAL | 6 refills | Status: DC

## 2018-02-28 MED ORDER — ALBUTEROL SULFATE (2.5 MG/3ML) 0.083% IN NEBU
2.5000 mg | INHALATION_SOLUTION | Freq: Once | RESPIRATORY_TRACT | Status: AC
Start: 2018-02-28 — End: 2018-02-28
  Administered 2018-02-28: 2.5 mg via RESPIRATORY_TRACT

## 2018-02-28 NOTE — Progress Notes (Signed)
   CC: asthma exacerbation  HPI  Has asthma, feels it is moving into her chest. Can't walk without getting SOB. Started 3 days ago with what she thinks was allergies (rhinorrhea, cough). No other sick contacts. Using albuterol more than every 4 hours. Albuterol is not helping. Has been using dulera daily. No fevers. Started with sneezing. Scratchy throat. Rhinorrhea.    She has been intubated before.  On a scale of 0 being well to 10 being intubation, she rates this an 8. She states she probably would have had to go to the ED this evening if she hadn't been able to come in today.   Of note, she works nights on 5E at [employer].   ROS: Denies CP, SOB, abdominal pain, dysuria, changes in BMs.   CC, SH/smoking status, and VS noted  Objective: BP 110/64   Pulse 77   Temp 98 F (36.7 C) (Oral)   Ht  (1.676 m)   Wt 168 lb 3.2 oz (76.3 kg)   SpO2 93%   BMI 27.15 kg/m  Gen: NAD, alert, cooperative, and pleasant. HEENT: NCAT, EOMI, PERRL CV: RRR, no murmur Resp: Diffuse inspiratory and expiratory wheezing, good air movement. No increased WOB at rest.  Ext: No edema, warm Neuro: Alert and oriented, Speech clear, No gross deficits  Assessment and plan:  Asthma with acute exacerbation Recurrent exacerbation today likely due to URI vs allergies. Given  depomedrol in clinic IM, duoneb in clinic, prescribed  x 5 day prednisone pack. Return to clinic tomorrow to see how her wheezing is doing. She is knowledgeable about her asthma and knows to go to ED if worsening before tomorrow. Did not order CXR as no focal findings on lung exam and no fevers. Precepted with Dr. Leveda Anna who knows her well. Added flonase for help with allergies in the future.    Meds ordered this encounter  Medications  . predniSONE (DELTASONE) 50 MG tablet    Sig: Take one tablet every day for 5 days.    Dispense:  10 tablet    Refill:  0  . fluticasone (FLONASE) 50 MCG/ACT nasal spray    Sig: Place 2 sprays  into both nostrils daily.    Dispense:  16 g    Refill:  6  . ipratropium (ATROVENT) nebulizer solution 0.5 mg  . albuterol (PROVENTIL) (2.5 MG/3ML) 0.083% nebulizer solution 2.5 mg  . methylPREDNISolone acetate (DEPO-MEDROL) injection 80 mg    Health Maintenance reviewed - needs mammogram and colonoscopy.  Loni Muse, MD, PGY2 03/01/2018 9:16 AM

## 2018-02-28 NOTE — Patient Instructions (Signed)
It was a pleasure to see you today! Thank you for choosing [facility] for your primary care. Lydia Griffin was seen for asthma exacerbation.   Our plans for today were:  Use the nebulizer at home.   Take the prednisone starting tomorrow morning.   Come back tomorrow and let us check on your breathing.   Go to the ED if things get worse with your breathing.   Start taking the flonase to help with your allergies.   Best,  Dr. Chanetta Marshall

## 2018-03-01 ENCOUNTER — Ambulatory Visit: Payer: Self-pay | Admitting: Family Medicine

## 2018-03-01 NOTE — Assessment & Plan Note (Addendum)
Recurrent exacerbation today likely due to URI vs allergies. Given  depomedrol in clinic IM, duoneb in clinic, prescribed  x 5 day prednisone pack. Return to clinic tomorrow to see how her wheezing is doing. She is knowledgeable about her asthma and knows to go to ED if worsening before tomorrow. Did not order CXR as no focal findings on lung exam and no fevers. Precepted with Dr. Leveda Anna who knows her well. Added flonase for help with allergies in the future.

## 2018-04-17 ENCOUNTER — Encounter: Payer: Self-pay | Admitting: Family Medicine

## 2018-04-17 NOTE — Progress Notes (Signed)
Patient placed on my schedule for 04/26/18 for depression. I called to check on her. She stated that she is experiencing symptoms of depression and difficulty sleeping. She denies suicidal or homicidal ideation. I instructed her to go to the ED or call us if having severe symptoms or thoughts of hurting herself. She verbalized understanding and agreed with the plan. I will see her as scheduled.

## 2018-04-26 ENCOUNTER — Ambulatory Visit: Payer: Self-pay | Admitting: Family Medicine

## 2018-04-29 ENCOUNTER — Telehealth: Payer: Self-pay | Admitting: Family Medicine

## 2018-04-29 NOTE — Telephone Encounter (Signed)
[name] patient assistance program enrollment form dropped off for at front desk for completion.  Verified that patient section of form has been completed.  Last DOS with  was 04/26/18.  Placed form in blue  team folder to be completed by clinical staff.  Lina Sarheryl A Stanley

## 2018-04-29 NOTE — Telephone Encounter (Signed)
Clinical info completed on medication form.  Place form in Dr. Riccio's box for completion.  Vasili Fok,  Delquan Poucher, CMA   

## 2018-05-02 ENCOUNTER — Telehealth: Payer: Self-pay

## 2018-05-02 NOTE — Telephone Encounter (Signed)
Patient left message on nurse line that she has dropped off forms for medication assistance through [name] but is out of her albuterol inhaler and needs assistance to get one.  Sp[facility] with Brett [facility] at [facility] who said to send a Rx for [employer] and he can get for no cost for us.  Rx sent to PCP to sign and send to pharmacy. Patient aware and expresses gratitude.   [facility] V[facility]:  Previous assistance?  None   Taxi, Bus pass, gas card, food, or other? None found Med co-pay? None found [facility] [employer] Indigent Fund?  None found Other:  none  Provider:  [facility] Staff:  Nigel MormonAlisa S Brake, RN Note routed to Altamese DillingJeannette Richardson, RN and Sammuel Hineseborah Moore, LCSW to add to [facility] spreadsheet.

## 2018-05-03 MED ORDER — ALBUTEROL SULFATE HFA 108 (90 BASE) MCG/ACT IN AERS: 2.0000 | INHALATION_SPRAY | Freq: Four times a day (QID) | RESPIRATORY_TRACT | 0 refills | Status: DC | PRN

## 2018-05-03 MED FILL — VENTOLIN HFA 90 MCG INHALER: 108 (90 BAS | 25 days supply | Qty: 18 | Fill #0

## 2018-05-03 NOTE — Telephone Encounter (Signed)
  Prescription filled. Will fill out paperwork and get back to patient.  Dolores PattyAngela Ulas Zuercher, DO PGY-2, [facility] 05/03/2018 8:53 AM

## 2018-05-29 NOTE — Telephone Encounter (Signed)
Formed placed in outgoing mail.

## 2018-05-30 ENCOUNTER — Ambulatory Visit: Payer: Self-pay | Admitting: Family Medicine

## 2018-06-05 ENCOUNTER — Other Ambulatory Visit: Payer: Self-pay

## 2018-06-05 MED ORDER — ALBUTEROL SULFATE HFA 108 (90 BASE) MCG/ACT IN AERS: 2.0000 | INHALATION_SPRAY | Freq: Four times a day (QID) | RESPIRATORY_TRACT | 3 refills | Status: DC | PRN

## 2018-06-10 ENCOUNTER — Encounter: Payer: Self-pay | Admitting: Family Medicine

## 2018-06-10 ENCOUNTER — Ambulatory Visit (INDEPENDENT_AMBULATORY_CARE_PROVIDER_SITE_OTHER): Payer: Self-pay | Admitting: Family Medicine

## 2018-06-10 VITALS — BP 105/60 | HR 69 | Temp 98.6°F | Ht 66.0 in | Wt 170.8 lb

## 2018-06-10 DIAGNOSIS — J4541 Moderate persistent asthma with (acute) exacerbation: Secondary | ICD-10-CM

## 2018-06-10 MED ORDER — IPRATROPIUM-ALBUTEROL 0.5-2.5 (3) MG/3ML IN SOLN: 3.0000 mL | RESPIRATORY_TRACT | 1 refills | Status: DC | PRN

## 2018-06-10 MED ORDER — IPRATROPIUM BROMIDE 0.02 % IN SOLN
0.5000 mg | Freq: Once | RESPIRATORY_TRACT | Status: AC
  Administered 2018-06-10: 0.5 mg via RESPIRATORY_TRACT

## 2018-06-10 MED ORDER — ALBUTEROL SULFATE (2.5 MG/3ML) 0.083% IN NEBU
2.5000 mg | INHALATION_SOLUTION | Freq: Once | RESPIRATORY_TRACT | Status: AC
  Administered 2018-06-10: 2.5 mg via RESPIRATORY_TRACT

## 2018-06-10 MED ORDER — PREDNISONE 20 MG PO TABS: 40.0000 mg | ORAL_TABLET | Freq: Every day | ORAL | 0 refills | Status: AC

## 2018-06-10 MED ORDER — METHYLPREDNISOLONE SODIUM SUCC 125 MG IJ SOLR
80.0000 mg | Freq: Once | INTRAMUSCULAR | Status: AC
Start: 2018-06-10 — End: 2018-06-10
  Administered 2018-06-10: 80 mg via INTRAMUSCULAR

## 2018-06-10 MED ORDER — METHYLPREDNISOLONE SODIUM SUCC 40 MG IJ SOLR: 80.0000 mg | Freq: Once | INTRAMUSCULAR | Status: DC

## 2018-06-10 NOTE — Progress Notes (Signed)
Subjective:    Patient ID: Lydia Griffin, female    DOB: August 07, 1961, 57 y.o.   MRN: [id number]   CC: Asthma exacerbation   HPI: Asthma exacerbation Patient is presenting for worsening symptoms of her asthma.  Patient states that since August 8 she has had worsening asthma.  States that on the eighth she felt as if she was getting sick which is when her symptoms began.  Patient states that symptoms occur all day long, wakes up with symptoms and goes to sleep with symptoms.  Patient wakes up nightly needing a breathing treatment.  Patient has had to use rescue inhaler daily every 2-4 hours.  Recently it has been closer to every 2 hours.  Patient states she also ran out of her [name] as there was an issue with shipping her medications.  Patient is out of her albuterol nebulizer treatments and is almost out of albuterol rescue inhaler she has been using it very frequently.  Patient is also been measuring peak flow at home and noticed that it is decreased.  Patient is also increase Claritin to twice a day in hopes of improving symptoms with no relief.  Patient states that breathing becomes worse with movement.  Patient last use albuterol 1 hour before visit.  At baseline patient normally does not use rescue inhalers and does not wheeze at baseline.  Patient's triggers are colds or weather changes.  Patient's medications include Flonase, Dulera, Singulair.  Patient has history of 1-2 intubations, at least 10 ICU admissions, and over 20 admissions to the regular floor.  Patient always gets admitted when she goes to the emergency department.   Objective:  BP 105/60   Pulse 69   Temp 98.6 F (37 C)   Wt 170 lb 12.8 oz (77.5 kg)   SpO2 98%   BMI 27.57 kg/m  Vitals and nursing note reviewed  General: well nourished, tearful, speaking full sentences but needs to take deep breaths in between HEENT: normocephalic, moist mucous membranes  Neck: supple, non-tender, without lymphadenopathy Cardiac: RRR,  clear S1 and S2, no murmurs, rubs, or gallops Respiratory: clear to auscultation bilaterally, increased WOB, bilateral inspiratory and expiratory wheezing throughout lungs, no accessory muscle use  Abdomen: soft, nontender, nondistended, no masses or organomegaly. Bowel sounds present Extremities: no edema or cyanosis. Warm, well perfused.  Skin: warm and dry, no rashes noted, <3sec cap refill  Neuro: alert and oriented, no focal deficits   Assessment & Plan:    Asthma with acute exacerbation Patient with history of chronic asthma with frequent exacerbations.  Most recent exacerbation URI versus allergic.  Ambulatory pulse ox showing 98% O2 sat.  Will give 80 mg Solu-Medrol in clinic IM as well as DuoNeb treatment.  Patient states she feels significantly improved after DuoNeb treatment.  Lung exam also improved but wheezing still present.  Unfortunately there are no Dulera samples in clinic at this moment so unable to give [facility] sample to give to patient until her prescription comes in through the mail.  We will give Rx for 5 days prednisone as well as for DuoNeb treatments be used as needed at home.  Patient already has prescription sent from PCP for albuterol rescue inhaler and nebulizer treatment sent to pharmacy.  Letter given to patient to excuse her from work this week as she will be unable to work due to frequent need of breathing treatments.  Patient is very knowledgeable about her asthma and understands when to go to the emergency department.  Patient is historically a reliable.  Will not need chest x-ray at this time.  Precepted patient with Dr. McDiarmid who independently examined patient as well.  Tries patient to follow-up in 2 weeks or sooner if no improvement.  Strict return precautions given to patient.  Strongly encourage patient to go to the emergency room if she does not improve, worsens, or does not feel relief after albuterol treatment.    Return in about 2 weeks (around  06/24/2018).   Oralia Manis, DO, PGY-2

## 2018-06-10 NOTE — Assessment & Plan Note (Signed)
Patient with history of chronic asthma with frequent exacerbations.  Most recent exacerbation URI versus allergic.  Ambulatory pulse ox showing 98% O2 sat.  Will give 80 mg Solu-Medrol in clinic IM as well as DuoNeb treatment.  Patient states she feels significantly improved after DuoNeb treatment.  Lung exam also improved but wheezing still present.  Unfortunately there are no Dulera samples in clinic at this moment so [facility] to [facility] to give to patient until her prescription comes in through the mail.  We will give Rx for 5 days prednisone as well as for DuoNeb treatments be used as needed at home.  Patient already has prescription sent from PCP for albuterol rescue inhaler and nebulizer treatment sent to pharmacy.  Letter given to patient to excuse her from work this week as she will be unable to work due to frequent need of breathing treatments.  Patient is very knowledgeable about her asthma and understands when to go to the emergency department.  Patient is historically a reliable.  Will not need chest x-ray at this time.  Precepted patient with Dr. McDiarmid who independently examined patient as well.  Tries patient to follow-up in 2 weeks or sooner if no improvement.  Strict return precautions given to patient.  Strongly encourage patient to go to the emergency room if she does not improve, worsens, or does not feel relief after albuterol treatment.

## 2018-06-10 NOTE — Patient Instructions (Signed)
It was a pleasure seeing you today.   Today we discussed your asthma  For your asthma: please continue to use the rescue inhaler as needed. Since you are out of dulera and we do not have any samples please use the duoneb treatments as needed. Please use prednisone daily for 5 days. If you feel worsening shortness of breath PLEASE GO TO THE EMERGENCY ROOM. If you do not feel relief after the rescue inhaler PLEASE GO TO THE EMERGENCY ROOM.   Please follow up in 2 weeks or sooner if symptoms persist or worsen. Please call the clinic immediately if you have any concerns.   Our clinic's number is [phone]. Please call with questions or concerns.   Thank you,  Oralia ManisSherin Rhett Najera, DO

## 2018-07-05 ENCOUNTER — Ambulatory Visit: Payer: Self-pay | Admitting: Family Medicine

## 2018-07-05 ENCOUNTER — Encounter: Payer: Self-pay | Admitting: Licensed Clinical Social Worker

## 2018-07-05 ENCOUNTER — Other Ambulatory Visit: Payer: Self-pay

## 2018-07-05 ENCOUNTER — Ambulatory Visit (INDEPENDENT_AMBULATORY_CARE_PROVIDER_SITE_OTHER): Payer: Self-pay | Admitting: Family Medicine

## 2018-07-05 VITALS — BP 132/72 | HR 77 | Temp 98.1°F | Ht 66.0 in | Wt 167.8 lb

## 2018-07-05 DIAGNOSIS — R634 Abnormal weight loss: Secondary | ICD-10-CM

## 2018-07-05 DIAGNOSIS — Z566 Other physical and mental strain related to work: Secondary | ICD-10-CM

## 2018-07-05 DIAGNOSIS — F331 Major depressive disorder, recurrent, moderate: Secondary | ICD-10-CM

## 2018-07-05 DIAGNOSIS — F411 Generalized anxiety disorder: Secondary | ICD-10-CM

## 2018-07-05 MED ORDER — SERTRALINE HCL 50 MG PO TABS: 50.0000 mg | ORAL_TABLET | Freq: Every day | ORAL | 1 refills | Status: DC

## 2018-07-05 NOTE — Progress Notes (Signed)
Type of Service: Integrated Behavioral Health Introduction  Lydia Griffin is a 57 y.o. female: referred by Sunrise Canyonockamy for assistance with managing symptoms of  depression.   [facility]. LCSW introduced self, provided handout and explained [facility].    Patient appreciative of information.  Sammuel Hineseborah Vern Prestia, LCSW Licensed Clinical Social Worker [facility]   [phone] 11:00 AM

## 2018-07-05 NOTE — Assessment & Plan Note (Signed)
Patient meets criteria for generalized anxiety disorder. I am hopefull that Sertraline given for her MDD will also help control these symptoms as well as getting access to our Integrated Care Team. - GAD-7 at next appointment.

## 2018-07-05 NOTE — Patient Instructions (Addendum)
It was great to meet you today! Thank you for letting me participate in your care!  Today, we discussed your current struggle with your feelings of depression and anxiety. You meet the criteria for medical depression and I am glad you are motivated to seek help. Remember, if you ever develop feelings or thoughts of hurting yourself, others, or ending your life please seek care immediately. I have started you on Sertraline today and I will see you in two weeks to see how you are doing on this medication. If you do not feel any better at that time we can increase the dose.  Also, please exercise at least 3 time per week for 30 minutes a day at your local gym. Getting out and being active will help improve your mood.   I have also given you the telephone number for Dr. Pascal LuxKane and our Integrated Behavioral Care Team. She is an excellent psychologist and I believe will help you as you work through these issues.  I will all you if your blood work from today is abnormal.  Be well, Jules Schickim Nollan Muldrow, DO PGY-2, [facility]

## 2018-07-05 NOTE — Assessment & Plan Note (Signed)
Most likely due to her depression but I have not see a recent TSH.  - Obtain TSH today, follow up with free T3 if TSH is elevated.

## 2018-07-05 NOTE — Assessment & Plan Note (Addendum)
Patient has history of depression but has been reluctant to try medication in the past. She meets criteria for MDD today. - Sertraline 50mg  daily, return in two weeks. If symptoms not improved titrate up to 100mg  daily -PHQ-9 at next appointment - Integrated care appointment in two weeks, warm hand off given today - Exercise 30 minutes per day x 3 times per week at the gym

## 2018-07-05 NOTE — Progress Notes (Signed)
     Subjective: Chief Complaint  Patient presents with  . Anxiety     HPI: Lydia Griffin is a 57 y.o. presenting to clinic today to discuss the following:  Depression Patient endorses that for the past year she has not felt like herself but especially over the past 6 months she has felt depressed, losing weight, had a decreased appetite, poor sleep, and has lost motivation to pursue hobbies and things that she used to enjoy doing. PHQ-9 score today is 16  Generalized Anxiety Patient expresses desire to pursue her GED but is "scared I'll fail". She states she feels "stuck" and is scared of various things including her health, taking care of her mother, being there for her daughter, her work, and she feels unable to deal with the anxiety. She states it feels like she just can't move forward due to the fear it produces and she finds it occupies her thoughts frequently during the day. GAD-7 score today is 13.  Health Maintenance: none     ROS noted in HPI.   Past Medical, Surgical, Social, and Family History Reviewed & Updated per EMR.   Pertinent Historical Findings include:   Social History   Tobacco Use  Smoking Status Never Smoker  Smokeless Tobacco Never Used   Objective: BP 132/72   Pulse 77   Temp 98.1 F (36.7 C) (Oral)   Ht 5\' 6"  (1.676 m)   Wt 167 lb 12.8 oz (76.1 kg)   SpO2 95%   BMI 27.08 kg/m  Vitals and nursing notes reviewed  Physical Exam Gen: Alert and Oriented x 3, NAD MSK: Moves all four extremities Ext: no clubbing, cyanosis, or edema Neuro: No gross deficits Skin: warm, dry, intact, no rashes Psych: tearful, depressed mood, no suicidal ideation or harmful intention  No results found for this or any previous visit (from the past 72 hour(s)).  Assessment/Plan:  Moderate episode of recurrent major depressive disorder [facility](HCC) Patient has history of depression but has been reluctant to try medication in the past. She meets criteria for MDD  today. - Sertraline 50mg  daily, return in two weeks. If symptoms not improved titrate up to 100mg  daily -PHQ-9 at next appointment - Integrated care appointment in two weeks, warm hand off given today - Exercise 30 minutes per day x 3 times per week at the gym  Generalized anxiety disorder Patient meets criteria for generalized anxiety disorder. I am hopefull that Sertraline given for her MDD will also help control these symptoms as well as getting access to our Integrated Care Team. - GAD-7 at next appointment.  Losing weight Most likely due to her depression but I have not see a recent TSH.  - Obtain TSH today, follow up with free T3 if TSH is elevated.   PATIENT EDUCATION PROVIDED: See AVS    Diagnosis and plan along with any newly prescribed medication(s) were discussed in detail with this patient today. The patient verbalized understanding and agreed with the plan. Patient advised if symptoms worsen return to clinic or ER.   Health Maintainance:   Orders Placed This Encounter  Procedures  . TSH    Meds ordered this encounter  Medications  . sertraline (ZOLOFT) 50 MG tablet    Sig: Take 1 tablet (50 mg total) by mouth daily.    Dispense:  30 tablet    Refill:  1    Tim Karen ChafeLockamy, DO 07/05/2018, 10:20 AM PGY-2 [facility]

## 2018-07-06 LAB — TSH: TSH: 0.227 u[IU]/mL — ABNORMAL LOW (ref 0.450–4.500)

## 2018-07-10 NOTE — Addendum Note (Signed)
Addended by: Jennette BillBUSICK, ROBERT L on: 07/10/2018 10:44 AM   Modules accepted: Orders

## 2018-07-11 LAB — T3, FREE: T3, Free: 3.6 pg/mL (ref 2.0–4.4)

## 2018-07-11 LAB — T4, FREE: Free T4: 1.37 ng/dL (ref 0.82–1.77)

## 2018-07-11 LAB — SPECIMEN STATUS REPORT

## 2018-07-12 ENCOUNTER — Encounter: Payer: Self-pay | Admitting: Family Medicine

## 2018-07-12 NOTE — Progress Notes (Signed)
Called patient and informed her of abnormal TSH but normal T3 and T4 and explained that this is common and no longer need to pursue thyroid workup.

## 2018-09-04 ENCOUNTER — Emergency Department (HOSPITAL_COMMUNITY)
Admission: EM | Admit: 2018-09-04 | Discharge: 2018-09-04 | Disposition: A | Discharge status: Home / Self Care | Payer: Self-pay | Attending: Emergency Medicine | Admitting: Emergency Medicine

## 2018-09-04 DIAGNOSIS — J45909 Unspecified asthma, uncomplicated: Secondary | ICD-10-CM | POA: Insufficient documentation

## 2018-09-04 DIAGNOSIS — M5489 Other dorsalgia: Secondary | ICD-10-CM | POA: Insufficient documentation

## 2018-09-04 DIAGNOSIS — Z79899 Other long term (current) drug therapy: Secondary | ICD-10-CM | POA: Insufficient documentation

## 2018-09-04 DIAGNOSIS — T148XXA Other injury of unspecified body region, initial encounter: Secondary | ICD-10-CM

## 2018-09-04 MED ORDER — HYDROXYZINE HCL 25 MG PO TABS: 25.0000 mg | ORAL_TABLET | Freq: Three times a day (TID) | ORAL | 0 refills | Status: DC | PRN

## 2018-09-04 MED ORDER — HYDROXYZINE HCL 25 MG PO TABS
25.0000 mg | ORAL_TABLET | Freq: Once | ORAL | Status: AC
  Administered 2018-09-04: 25 mg via ORAL
  Filled 2018-09-04: qty 1

## 2018-09-04 NOTE — Discharge Instructions (Signed)
Take ibuprofen 3 times a day with meals. Take 800 mg (4 pills) at a time. Do not take other anti-inflammatories at the same time (Advil, Motrin, naproxen, Aleve). You may supplement with Tylenol if you need further pain control. Use the cream such as salon poss, icy hot, or BenGay to help with your pain. Take vistaril as needed for anxiety or inability to sleep.  Use ice packs or heating pads if this helps control your pain. You will likely have continued muscle stiffness and soreness over the next couple days.  Follow-up with primary care in 1 week if your symptoms are not improving. Return to the emergency room if you develop vision changes, vomiting, slurred speech, numbness, loss of bowel or bladder control, or any new or worsening symptoms.

## 2018-09-04 NOTE — ED Notes (Signed)
Pt given prescribed meds prior to pt leaving department but after pt was discharged from ED. Pt taken to lobby by this RN. Wristband removed prior to departure.
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
## 2018-11-25 ENCOUNTER — Encounter (HOSPITAL_COMMUNITY): Payer: Self-pay | Admitting: Emergency Medicine

## 2018-11-25 ENCOUNTER — Emergency Department (HOSPITAL_COMMUNITY): Payer: Self-pay

## 2018-11-25 ENCOUNTER — Emergency Department (HOSPITAL_COMMUNITY)
Admission: EM | Admit: 2018-11-25 | Discharge: 2018-11-26 | Disposition: A | Discharge status: Home / Self Care | Payer: Self-pay | Attending: Emergency Medicine | Admitting: Emergency Medicine

## 2018-11-25 DIAGNOSIS — R5383 Other fatigue: Secondary | ICD-10-CM | POA: Insufficient documentation

## 2018-11-25 DIAGNOSIS — R1110 Vomiting, unspecified: Secondary | ICD-10-CM | POA: Insufficient documentation

## 2018-11-25 DIAGNOSIS — J45909 Unspecified asthma, uncomplicated: Secondary | ICD-10-CM | POA: Insufficient documentation

## 2018-11-25 DIAGNOSIS — J189 Pneumonia, unspecified organism: Secondary | ICD-10-CM | POA: Insufficient documentation

## 2018-11-25 DIAGNOSIS — Z79899 Other long term (current) drug therapy: Secondary | ICD-10-CM | POA: Insufficient documentation

## 2018-11-25 DIAGNOSIS — J181 Lobar pneumonia, unspecified organism: Secondary | ICD-10-CM

## 2018-11-25 LAB — COMPREHENSIVE METABOLIC PANEL
ALT: 22 U/L (ref 0–44)
AST: 35 U/L (ref 15–41)
Albumin: 3.7 g/dL (ref 3.5–5.0)
Alkaline Phosphatase: 92 U/L (ref 38–126)
Anion gap: 8 (ref 5–15)
BILIRUBIN TOTAL: 0.8 mg/dL (ref 0.3–1.2)
BUN: 9 mg/dL (ref 6–20)
CO2: 21 mmol/L — ABNORMAL LOW (ref 22–32)
Calcium: 8.6 mg/dL — ABNORMAL LOW (ref 8.9–10.3)
Chloride: 105 mmol/L (ref 98–111)
Creatinine, Ser: 0.88 mg/dL (ref 0.44–1.00)
GFR calc Af Amer: >60 mL/min (ref >60)
GFR calc non Af Amer: >60 mL/min (ref >60)
Glucose, Bld: 92 mg/dL (ref 70–99)
POTASSIUM: 4.1 mmol/L (ref 3.5–5.1)
Sodium: 134 mmol/L — ABNORMAL LOW (ref 135–145)
Total Protein: 7 g/dL (ref 6.5–8.1)

## 2018-11-25 LAB — CBC
HCT: 40.1 % (ref 36.0–46.0)
Hemoglobin: 13.1 g/dL (ref 12.0–15.0)
MCH: 28.7 pg (ref 26.0–34.0)
MCHC: 32.7 g/dL (ref 30.0–36.0)
MCV: 87.7 fL (ref 80.0–100.0)
Platelets: 319 10*3/uL (ref 150–400)
RBC: 4.57 MIL/uL (ref 3.87–5.11)
RDW: 14.3 % (ref 11.5–15.5)
WBC: 17.3 10*3/uL — ABNORMAL HIGH (ref 4.0–10.5)
nRBC: 0 % (ref 0.0–0.2)

## 2018-11-25 LAB — INFLUENZA PANEL BY PCR (TYPE A & B)
Influenza A By PCR: NEGATIVE
Influenza B By PCR: NEGATIVE

## 2018-11-25 LAB — LIPASE, BLOOD: Lipase: 34 U/L (ref 11–51)

## 2018-11-25 MED ORDER — ONDANSETRON HCL 4 MG/2ML IJ SOLN
4.0000 mg | Freq: Once | INTRAMUSCULAR | Status: AC
  Administered 2018-11-25: 4 mg via INTRAVENOUS
  Filled 2018-11-25: qty 2

## 2018-11-25 MED ORDER — IPRATROPIUM-ALBUTEROL 0.5-2.5 (3) MG/3ML IN SOLN
3.0000 mL | Freq: Once | RESPIRATORY_TRACT | Status: AC
  Administered 2018-11-25: 3 mL via RESPIRATORY_TRACT
  Filled 2018-11-25: qty 3

## 2018-11-25 MED ORDER — SODIUM CHLORIDE 0.9 % IV SOLN: INTRAVENOUS | Status: DC | PRN

## 2018-11-25 MED ORDER — MAGNESIUM SULFATE 2 GM/50ML IV SOLN
2.0000 g | Freq: Once | INTRAVENOUS | Status: AC
  Administered 2018-11-26: 2 g via INTRAVENOUS
  Filled 2018-11-25: qty 50

## 2018-11-25 MED ORDER — IPRATROPIUM-ALBUTEROL 0.5-2.5 (3) MG/3ML IN SOLN
3.0000 mL | Freq: Once | RESPIRATORY_TRACT | Status: AC
  Administered 2018-11-26: 3 mL via RESPIRATORY_TRACT
  Filled 2018-11-25: qty 3

## 2018-11-25 MED ORDER — KETOROLAC TROMETHAMINE 15 MG/ML IJ SOLN
15.0000 mg | Freq: Once | INTRAMUSCULAR | Status: AC
  Administered 2018-11-25: 15 mg via INTRAVENOUS
  Filled 2018-11-25: qty 1

## 2018-11-25 MED ORDER — SODIUM CHLORIDE 0.9 % IV SOLN
500.0000 mg | Freq: Once | INTRAVENOUS | Status: AC
  Administered 2018-11-26: 500 mg via INTRAVENOUS
  Filled 2018-11-25: qty 500

## 2018-11-25 MED ORDER — SODIUM CHLORIDE 0.9 % IV SOLN
1.0000 g | Freq: Once | INTRAVENOUS | Status: AC
  Administered 2018-11-25: 1 g via INTRAVENOUS
  Filled 2018-11-25: qty 10

## 2018-11-25 MED ORDER — METHYLPREDNISOLONE SODIUM SUCC 125 MG IJ SOLR
125.0000 mg | Freq: Once | INTRAMUSCULAR | Status: AC
  Administered 2018-11-25: 125 mg via INTRAVENOUS
  Filled 2018-11-25: qty 2

## 2018-11-25 MED ORDER — SODIUM CHLORIDE 0.9 % IV BOLUS
1000.0000 mL | Freq: Once | INTRAVENOUS | Status: AC
  Administered 2018-11-25: 1000 mL via INTRAVENOUS

## 2018-11-25 NOTE — ED Notes (Signed)
Patient reports that she just went to bathroom in lobby before coming back. Patient will let us know when she can provide urine sample.

## 2018-11-25 NOTE — ED Notes (Signed)
Provided patient a sandwich and ginger ale with permission from [employer]Kelly [employer]. PA

## 2018-11-25 NOTE — ED Triage Notes (Signed)
Per [facility] pt from home for fever, chills, n/v, fatigue since yesterday. Pt works [facility] [employer] and been around sick people.  Zofran 8mg  via IV given in route and NS 500ml. Pt was given Tylenol in route but patient vomited up.  Pt has 20g in right AC.  Vitals: 126/68, 82HR, 96% on RA, 16R, temp 101.2. CBG 112

## 2018-11-25 NOTE — ED Provider Notes (Signed)
[facility]-EMERGENCY DEPT Provider Note   CSN: [id number] Arrival date & time: 11/25/18  1557     History   Chief Complaint Chief Complaint  Patient presents with  . Fever  . Chills  . Emesis  . Fatigue    HPI Lydia Griffin is a 58 y.o. female who presents with fever, shortness of breath, nausea.  Past medical history significant for asthma.  She states that she had acute onset of fever and chills last night about 2 AM.  She is also very nauseous and had recurrent vomiting.  She works at [employer] and exposed to multiple sick contacts.  She has had a flu shot this year.  She reports associated runny nose, sore throat, coughing, wheezing, rib pain.  She also reports a rash around her neck which she states she gets when her asthma is severe.  She had Zofran by EMS at about 3 PM which helped with her nausea but it is returning.  She reports mild upper abdominal pain.  She denies any diarrhea or urinary symptoms.  She reports being hospitalized multiple times for asthma.  HPI  Past Medical History:  Diagnosis Date  . Asthma    Started as a child; Has been intubated for asthma exacerbation twice in approx 1990  . Shortness of breath    "related to asthma attacks" (09/19/2012)    Patient Active Problem List   Diagnosis Date Noted  . Losing weight 07/05/2018  . Generalized anxiety disorder 07/05/2018  . Nasal abscess 08/13/2017  . Asthma exacerbation 05/02/2017  . Healthcare maintenance 02/07/2017  . Screening cholesterol level 10/25/2016  . Insomnia 12/30/2012  . Asthma with acute exacerbation 08/15/2011  . Asthma, chronic 11/02/2009  . Overweight(278.02) 12/20/2006  . 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 hand     flexor tendon repair  . TUBAL LIGATION  ~ 1985     OB History   No obstetric history on file.      Home Medications    Prior to Admission  medications   Medication Sig Start Date End Date Taking? Authorizing Provider  albuterol (PROAIR HFA) 108 (90 Base) MCG/ACT inhaler Inhale 2 puffs into the lungs every 6 (six) hours as needed for wheezing or shortness of breath. 06/05/18   Dolores Patty C, DO  albuterol (PROVENTIL) (2.5 MG/3ML) 0.083% nebulizer solution Take 3 mLs (2.5 mg total) by nebulization every 4 (four) hours as needed for wheezing or shortness of breath. 10/19/17   Tillman Sers, DO  Biotin 1 MG CAPS Take 1 mg by mouth daily.    [provider]  Cyanocobalamin (VITAMIN B 12 PO) Take 1,000 mcg by mouth daily.    [provider]  fluticasone (FLONASE) 50 MCG/ACT nasal spray Place 2 sprays into both nostrils daily. 02/28/18   Garth Bigness, MD  hydrOXYzine (ATARAX/VISTARIL) 25 MG tablet Take 1 tablet (25 mg total) by mouth every 8 (eight) hours as needed. 09/04/18   Caccavale, Sophia, PA-C  ipratropium-albuterol (DUONEB) 0.5-2.5 (3) MG/3ML SOLN Take 3 mLs by nebulization every 4 (four) hours as needed. 06/10/18   Oralia Manis, DO  loratadine (CLARITIN) 10 MG tablet Take 1 tablet (10 mg total) by mouth daily. 02/16/14   Charlane Ferretti, MD  mometasone-formoterol (DULERA) 200-5 MCG/ACT AERO Inhale 2 puffs into the lungs 2 (two) times daily. 11/21/17   Dolores Patty C, DO  montelukast (SINGULAIR) 10 MG  tablet Take 1 tablet (10 mg total) by mouth at bedtime. 07/02/17   Tillman Sersiccio, Angela C, DO  Multiple Vitamins-Minerals (MULTIVITAMIN WITH MINERALS) tablet Take 1 tablet by mouth daily.    [provider]  mupirocin ointment (BACTROBAN) 2 % Place into the nose 2 (two) times daily. 08/11/17   Oralia ManisAbraham, Sherin, DO  sertraline (ZOLOFT) 50 MG tablet Take 1 tablet (50 mg total) by mouth daily. 07/05/18   Arlyce HarmanLockamy, Timothy, DO    Family History Family History  Problem Relation Age of Onset  . Asthma Mother   . Heart disease Father 4564       MI  . Alcohol abuse Father   . Asthma Sister 9       death from  asthma    Social History Social History   Tobacco Use  . Smoking status: Never Smoker  . Smokeless tobacco: Never Used  Substance Use Topics  . Alcohol use: No  . Drug use: Yes    Comment: Pt states she "smoked a lot of weed" as a teenager, but has not used marijuana for >30 years.     Allergies   Patient has no known allergies.   Review of Systems Review of Systems  Constitutional: Positive for chills and fever.  Respiratory: Positive for cough, chest tightness, shortness of breath and wheezing.   Cardiovascular: Positive for chest pain (Rib). Negative for leg swelling.  Gastrointestinal: Positive for abdominal pain (Upper), nausea and vomiting. Negative for diarrhea.  Genitourinary: Negative for dysuria, frequency and hematuria.  Skin: Positive for rash.  Allergic/Immunologic: Negative for immunocompromised state.  All other systems reviewed and are negative.    Physical Exam Updated Vital Signs BP 114/63   Pulse 76   Temp (!) 101 F (38.3 C) (Oral)   Resp 16   SpO2 98%   Physical Exam Vitals signs and nursing note reviewed.  Constitutional:      General: She is not in acute distress.    Appearance: Normal appearance. She is well-developed.  HENT:     Head: Normocephalic and atraumatic.     Right Ear: Hearing, tympanic membrane, ear canal and external ear normal.     Left Ear: Hearing, tympanic membrane, ear canal and external ear normal.     Nose: Rhinorrhea present.     Mouth/Throat:     Lips: Pink.     Mouth: Mucous membranes are moist.     Pharynx: Oropharynx is clear.  Eyes:     General: No scleral icterus.       Right eye: No discharge.        Left eye: No discharge.     Conjunctiva/sclera: Conjunctivae normal.     Pupils: Pupils are equal, round, and reactive to light.  Neck:     Musculoskeletal: Normal range of motion.  Cardiovascular:     Rate and Rhythm: Normal rate and regular rhythm.  Pulmonary:     Effort: Pulmonary effort is normal.  No respiratory distress.     Breath sounds: Wheezing present.  Abdominal:     General: There is no distension.     Palpations: Abdomen is soft.     Tenderness: There is abdominal tenderness (Mild upper abdominal tenderness).  Skin:    General: Skin is warm and dry.     Findings: Rash (Faint erythematous rash around the neck) present.  Neurological:     Mental Status: She is alert and oriented to person, place, and time.  Psychiatric:  Behavior: Behavior normal.      ED Treatments / Results  Labs (all labs ordered are listed, but only abnormal results are displayed) Labs Reviewed  COMPREHENSIVE METABOLIC PANEL - Abnormal; Notable for the following components:      Result Value   Sodium 134 (*)    CO2 21 (*)    Calcium 8.6 (*)    All other components within normal limits  CBC - Abnormal; Notable for the following components:   WBC 17.3 (*)    All other components within normal limits  URINALYSIS, ROUTINE W REFLEX MICROSCOPIC - Abnormal; Notable for the following components:   Hgb urine dipstick SMALL (*)    Bacteria, UA RARE (*)    All other components within normal limits  LIPASE, BLOOD  PREGNANCY, URINE  INFLUENZA PANEL BY PCR (TYPE A & B)    EKG None  Radiology Dg Chest 2 View  Result Date: 11/25/2018 CLINICAL DATA:  Fever, chills and fatigue. EXAM: CHEST - 2 VIEW COMPARISON:  Chest radiograph August 11, 2017 FINDINGS: LEFT lower lobe consolidation. Mild chronic interstitial changes without pleural effusion. Mild similar hyperinflation. Cardiomediastinal silhouette is normal. No pneumothorax. Soft tissue planes and included osseous structures are non suspicious. IMPRESSION: LEFT lower lobe pneumonia. Followup PA and lateral chest X-ray is recommended in 3-4 weeks following trial of antibiotic therapy to ensure resolution and exclude underlying malignancy. Electronically Signed   By: Awilda Metro M.D.   On: 11/25/2018 22:23    Procedures Procedures  (including critical care time)  CRITICAL CARE Performed by: Bethel Born   Total critical care time: 40 minutes  Critical care time was exclusive of separately billable procedures and treating other patients.  Critical care was necessary to treat or prevent imminent or life-threatening deterioration.  Critical care was time spent personally by me on the following activities: development of treatment plan with patient and/or surrogate as well as nursing, discussions with consultants, evaluation of patient's response to treatment, examination of patient, obtaining history from patient or surrogate, ordering and performing treatments and interventions, ordering and review of laboratory studies, ordering and review of radiographic studies, pulse oximetry and re-evaluation of patient's condition.   Medications Ordered in ED Medications  0.9 %  sodium chloride infusion (has no administration in time range)  ipratropium-albuterol (DUONEB) 0.5-2.5 (3) MG/3ML nebulizer solution 3 mL (3 mLs Nebulization Given 11/25/18 2225)  sodium chloride 0.9 % bolus 1,000 mL (0 mLs Intravenous Stopped 11/26/18 0120)  ondansetron (ZOFRAN) injection 4 mg (4 mg Intravenous Given 11/25/18 2221)  ketorolac (TORADOL) 15 MG/ML injection 15 mg (15 mg Intravenous Given 11/25/18 2223)  methylPREDNISolone sodium succinate (SOLU-MEDROL) 125 mg/2 mL injection 125 mg (125 mg Intravenous Given 11/25/18 2223)  cefTRIAXone (ROCEPHIN) 1 g in sodium chloride 0.9 % 100 mL IVPB (0 g Intravenous Stopped 11/26/18 0047)  azithromycin (ZITHROMAX) 500 mg in sodium chloride 0.9 % 250 mL IVPB (0 mg Intravenous Stopped 11/26/18 0231)  ipratropium-albuterol (DUONEB) 0.5-2.5 (3) MG/3ML nebulizer solution 3 mL (3 mLs Nebulization Given 11/26/18 0027)  magnesium sulfate IVPB 2 g 50 mL (0 g Intravenous Stopped 11/26/18 0341)  albuterol (PROVENTIL,VENTOLIN) solution continuous neb (10 mg/hr Nebulization Given 11/26/18 0115)  sodium chloride 0.9 % bolus 1,000 mL  (0 mLs Intravenous Stopped 11/26/18 0518)     Initial Impression / Assessment and Plan / ED Course  I have reviewed the triage vital signs and the nursing notes.  Pertinent labs & imaging results that were available during my care of the  patient were reviewed by me and considered in my medical decision making (see chart for details).  5610PM 58 year old female presents with fever, cough, shortness of breath and wheezing.  Blood pressures have been soft here.  She is febrile to 101.  Otherwise vital signs are normal.  On exam she has rhinorrhea and a cough.  She has diffuse wheezing.  CBC is remarkable for leukocytosis of 17.3.  CMP and lipase are normal.  UA is normal.  Will add flu, chest x-ray and give fluid bolus as well as Solu-Medrol and DuoNeb.  10:30PM Chest x-ray is remarkable for left lower lobe pneumonia.  Flu is negative.  Will give Rocephin and azithromycin.  On repeat lung exam she is still wheezing.  Will give another DuoNeb and magnesium.  1AM Repeated lung exam again.  She is still wheezing but is feeling much better.  She is tolerated p.o.  Shared decision making was made with the patient.  She prefers to go home.  We will give a continuous hour-long neb and we will have her ambulate in the hall to ensure she does not become hypoxic  5AM Patient was ambulated in the hall and did not become hypoxic although did say she was short of breath.  She is resting comfortably.  Blood pressures remain low normal but are stable.  She was given prescription for antibiotics and steroids.  She has inhalers at home.  She was given strict return precautions.  Final Clinical Impressions(s) / ED Diagnoses   Final diagnoses:  Community acquired pneumonia of left lower lobe of lung [facility](HCC)    ED Discharge Orders    None       Bethel BornGekas, Babetta Paterson Marie, PA-C 11/26/18 0604    Benjiman CorePickering, Nathan, MD 11/26/18 2348

## 2018-11-26 ENCOUNTER — Other Ambulatory Visit: Payer: Self-pay

## 2018-11-26 LAB — URINALYSIS, ROUTINE W REFLEX MICROSCOPIC
Bilirubin Urine: NEGATIVE
GLUCOSE, UA: NEGATIVE mg/dL
Ketones, ur: NEGATIVE mg/dL
Leukocytes, UA: NEGATIVE
NITRITE: NEGATIVE
Protein, ur: NEGATIVE mg/dL
Specific Gravity, Urine: 1.016 (ref 1.005–1.030)
pH: 7 (ref 5.0–8.0)

## 2018-11-26 LAB — PREGNANCY, URINE: Preg Test, Ur: NEGATIVE

## 2018-11-26 MED ORDER — PREDNISONE 20 MG PO TABS: 40.0000 mg | ORAL_TABLET | Freq: Every day | ORAL | 0 refills | Status: DC

## 2018-11-26 MED ORDER — SODIUM CHLORIDE 0.9 % IV BOLUS
1000.0000 mL | Freq: Once | INTRAVENOUS | Status: AC
  Administered 2018-11-26: 1000 mL via INTRAVENOUS

## 2018-11-26 MED ORDER — AMOXICILLIN-POT CLAVULANATE 875-125 MG PO TABS: 1.0000 | ORAL_TABLET | Freq: Two times a day (BID) | ORAL | 0 refills | Status: DC

## 2018-11-26 MED ORDER — ALBUTEROL (5 MG/ML) CONTINUOUS INHALATION SOLN
10.0000 mg/h | INHALATION_SOLUTION | Freq: Once | RESPIRATORY_TRACT | Status: AC
  Administered 2018-11-26: 10 mg/h via RESPIRATORY_TRACT
  Filled 2018-11-26: qty 20

## 2018-11-26 NOTE — Discharge Instructions (Addendum)
Take Augmentin twice a day for infection. Take with food. Take Prednisone once daily for asthma exacerbation Use inhaler as needed for shortness of breath Please return if you are not getting better or worsening

## 2018-11-26 NOTE — ED Notes (Signed)
Pt resting comfortably, AOx 4

## 2018-11-26 NOTE — ED Notes (Addendum)
93-98% will ambulating. Patient reports feeling short of breath upon return to room

## 2018-11-28 ENCOUNTER — Ambulatory Visit: Payer: Self-pay | Admitting: Family Medicine

## 2018-11-28 ENCOUNTER — Telehealth: Payer: Self-pay

## 2018-11-28 NOTE — Telephone Encounter (Signed)
That sounds concerning for something else, she should be seen in clinic or UC. She can stop augmentin in the mean time.

## 2018-11-28 NOTE — Telephone Encounter (Signed)
Spoke to patient and made appt for this afternoon.  [name], RN [facility] RN)

## 2018-11-28 NOTE — Telephone Encounter (Signed)
Patient placed on Augmentin by ED for pneumonia.  Left message she is experiencing vomiting, nausea, and dark stools while taking this medication. Can something else be prescribed?  Call back is [phone]  [name], RN [facility] RN)

## 2018-12-03 ENCOUNTER — Ambulatory Visit: Payer: Self-pay | Admitting: Family Medicine

## 2018-12-06 ENCOUNTER — Ambulatory Visit: Payer: Self-pay

## 2019-01-08 ENCOUNTER — Telehealth: Payer: Self-pay

## 2019-01-08 MED ORDER — PREDNISONE 50 MG PO TABS: 50.0000 mg | ORAL_TABLET | Freq: Every day | ORAL | 0 refills | Status: DC

## 2019-01-08 NOTE — Telephone Encounter (Signed)
Patient called with two requests:  1. She has asthma and is wheezing. Is doing ok with her inhalers right now. She would like to have a prescription of Prednisone on hand if she needs it.  2. 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 Does her asthma make her high risk and should she not be at work?  Please call her to discuss. [phone].  [facility].  [name], RN [facility] RN)

## 2019-01-08 NOTE — Telephone Encounter (Signed)
  I sent in a prescription for prednisone burst in case she worsens.  She has severe asthma and she is high risk if she were to be exposed to Covid-19. If she is able to work from home I would highly recommend that.  Please inform her.  Dolores Patty, DO PGY-3, [facility] 01/08/2019 7:11 PM

## 2019-01-09 NOTE — Telephone Encounter (Signed)
LVM on patients phone informing her of medication to her pharmacy. I informed her PCP does not think she should be working, and if she needs a note to call us.

## 2019-01-13 NOTE — Telephone Encounter (Signed)
On nights, please draft letter stating she has severe persistent asthma making her high risk for severe illness if she contacts Covid-19. Would recommend she work from home.   Thanks

## 2019-01-13 NOTE — Telephone Encounter (Signed)
Pt calls back, she would need a letter and would like it faxed to [phone]. Jone Baseman, CMA

## 2019-01-14 NOTE — Telephone Encounter (Signed)
Letter drafted and placed in to be faxed pile. Pt informed. Jone Baseman, CMA

## 2019-01-16 NOTE — Telephone Encounter (Signed)
Valeri calls again.  They letter was never received. She ask [place] to fax it again and email to [email or web address]  Emailed as requested and placed copy in to be faxed pile. Jone Baseman, CMA

## 2019-01-20 ENCOUNTER — Telehealth: Payer: Self-pay | Admitting: *Deleted

## 2019-01-20 NOTE — Telephone Encounter (Signed)
Pt states that she needs a refill on her albuterol and dulera thru the [place] program. Jone Baseman, CMA

## 2019-01-21 ENCOUNTER — Telehealth: Payer: Self-pay | Admitting: Family Medicine

## 2019-01-21 MED ORDER — PREDNISONE 50 MG PO TABS: 50.0000 mg | ORAL_TABLET | Freq: Every day | ORAL | 0 refills | Status: DC

## 2019-01-21 NOTE — Telephone Encounter (Signed)
Pt informed.  She will print and bring to office. Jone Baseman, CMA

## 2019-01-21 NOTE — Telephone Encounter (Signed)
  Happy to do this- from the [facility] website there is a form that needs to be printed and signed by the patient, then I fill out the medications and sign and send it to [facility]. Can patient print this and drop it off at clinic?  If she has an immediate need for meds, do we have samples of dulera or albuterol to give her?  Thanks Dolores Patty, DO PGY-3, [facility] 01/21/2019 1:52 AM

## 2019-01-21 NOTE — Telephone Encounter (Signed)
Pt has been placed in Personal leave.  FMLA forms will need to be completed.  She will fax to my attention and I will place in Dr. Purnell Shoemaker box. Jone Baseman, CMA

## 2019-01-21 NOTE — Telephone Encounter (Addendum)
[facility] Telephone Triage Note  Patient called requesting rx for prednisone. Has history of severe asthma which has flared recently, she states due to weather. Was given rx for prednisone on 3/18 which helped but patient feels like she needs more. No severe shortness of breath, just is wheezing. Has neb machine at home and also has controller medication. No fevers. Not around anyone who is sick. Has been staying home and avoiding others due to the COVID pandemic. Patient states she is very familiar with managing her asthma (has peak flow meter, flutter valve etc) and feels it is time for more prednisone.  Patient in no respiratory distress at all on the phone. Speaking in full sentences comfortably.  Very reasonable to do another course of prednisone. Will send in 50mg  for 5 days. Patient appreciative.  Encouraged patient to stay home as much as possible to avoid exposure to COVID. Educated on emphasis of telehealth right now to minimize risk of disease transmission. Advised she call us with any concerns or needs.   FYI to PCP  Latrelle Dodrill, MD

## 2019-01-27 ENCOUNTER — Telehealth: Payer: Self-pay | Admitting: Family Medicine

## 2019-01-27 DIAGNOSIS — J4550 Severe persistent asthma, uncomplicated: Secondary | ICD-10-CM

## 2019-01-27 MED ORDER — PREDNISONE 50 MG PO TABS: 50.0000 mg | ORAL_TABLET | Freq: Every day | ORAL | 0 refills | Status: DC

## 2019-01-27 NOTE — Telephone Encounter (Signed)
Patient called as she continues to be in an asthma flare. She had a prednisone burst on 3/18 and again on 3/31. She had temporary resolution of her flare after each burst but asthma symptoms seem to come back, particularly when she goes outside as the outdoors are a trigger for her. She is compliant on her Dulera 2 puffs BID, singular, claritin and flonase. She has been using her nebs as needed. She does not have fever, stuffy nose, or cough. She states she is only mildly SOB and this feels exactly like her asthma which she is very familiar with. She is speaking in full sentences comfortably. She is very hesitant to leave her house and requests to be managed at home given how mild her symptoms are at the current time. Since she had some relief with prednisone discussed a longer burst of 10 days. Discussed that if no improvement then she may need solumedrol which may mean in office or ER visit, patient voiced good understanding.

## 2019-01-29 ENCOUNTER — Telehealth: Payer: Self-pay | Admitting: *Deleted

## 2019-01-29 NOTE — Telephone Encounter (Signed)
Pt received a call from work.  They are requesting a more detailed letter with pt specific risk. Jone Baseman, CMA

## 2019-01-30 NOTE — Telephone Encounter (Signed)
Can we get FMLA forms for her or something? Her asthma is very severe and she has had multiple hospitalizations before as well been intubated in the past- if she were to get COVID she would be extremely high risk. Can we update letter or do they need more in the way of FMAL form? Thanks

## 2019-01-30 NOTE — Telephone Encounter (Signed)
LMOVM for pt to call back.  Will ask if she wants letter with info from Dr. Wonda Olds or if she wants to attempt FMLA. Jone Baseman, CMA

## 2019-02-03 NOTE — Telephone Encounter (Signed)
Pt called nurse line annoyed. Pt stated she faxed over FMLA forms "sometime last week." I didn't see anything in PCP box. I asked her to fax them again. Better yet, asked her if she could drop them off here (I would meet her in the parking lot so she doesn't have to come in.) Pt then stated she does not have transportation. Fax number provided again, and if she is able to drop them off call us.

## 2019-02-03 NOTE — Telephone Encounter (Signed)
Pt dropped FMLA forms off. I placed them in your box and filled out what I could.

## 2019-02-04 NOTE — Telephone Encounter (Signed)
Pt contacted and informed her FMLA is ready for pick up. Pt stated she does not have transportation today and asked me to fax to her employer. Fax number provided [phone], [employer] [facility] [employer] [facility].  I have faxed this and made a copy for batch scanning.

## 2019-02-04 NOTE — Telephone Encounter (Signed)
fmla form completed and put back in RN box for pt to pick up

## 2019-02-12 NOTE — Telephone Encounter (Signed)
Pt called nurse line stating her FMLA forms were denied, they are saying its not covid related. Pt is saying she needs another letter from her PCP stating she can not go back to work until May 4th. Please advise.

## 2019-02-17 ENCOUNTER — Telehealth: Payer: Self-pay | Admitting: Family Medicine

## 2019-02-17 NOTE — Telephone Encounter (Signed)
  Wrote note, routed to [place], please print for patient

## 2019-02-17 NOTE — Telephone Encounter (Signed)
Pt notified of note ready for pick up. Pt will call me to bring out to her or her son will come in and get it.

## 2019-02-17 NOTE — Telephone Encounter (Signed)
Patient called as she has been having ongoing intermittent shortness of breath.  She has been treated multiple times for asthma flares.  It seems to temporarily improve and then subsequently worsened.  She is short of breath and having wheezing with any exertion.  She has not had any fevers.  She is having a dry cough that has been persistent in the setting of asthma.  During phone conversation she was having trouble talking in complete sentences due to her wheezing and shortness of breath.  Advised patient to go to emergency room for evaluation given her respiratory status.  Patient was initially hesitant and asked for appointment, first available made on 4/29.  Discussed that since patient was having audible wheezing with speech on telephone that strongly advised in person evaluation in ED. Patient agreed upon further consideration and will go to ER for evaluation at this time.

## 2019-02-19 ENCOUNTER — Emergency Department (HOSPITAL_COMMUNITY): Payer: Self-pay

## 2019-02-19 ENCOUNTER — Ambulatory Visit (INDEPENDENT_AMBULATORY_CARE_PROVIDER_SITE_OTHER): Payer: Self-pay | Admitting: Family Medicine

## 2019-02-19 ENCOUNTER — Emergency Department (HOSPITAL_COMMUNITY)
Admission: EM | Admit: 2019-02-19 | Discharge: 2019-02-19 | Disposition: A | Discharge status: Home / Self Care | Payer: Self-pay | Attending: Emergency Medicine | Admitting: Emergency Medicine

## 2019-02-19 ENCOUNTER — Other Ambulatory Visit: Payer: Self-pay

## 2019-02-19 ENCOUNTER — Encounter (HOSPITAL_COMMUNITY): Payer: Self-pay

## 2019-02-19 VITALS — BP 110/60 | HR 71

## 2019-02-19 DIAGNOSIS — J22 Unspecified acute lower respiratory infection: Secondary | ICD-10-CM | POA: Insufficient documentation

## 2019-02-19 DIAGNOSIS — J4541 Moderate persistent asthma with (acute) exacerbation: Secondary | ICD-10-CM

## 2019-02-19 DIAGNOSIS — J988 Other specified respiratory disorders: Secondary | ICD-10-CM

## 2019-02-19 DIAGNOSIS — Z79899 Other long term (current) drug therapy: Secondary | ICD-10-CM | POA: Insufficient documentation

## 2019-02-19 DIAGNOSIS — J45901 Unspecified asthma with (acute) exacerbation: Secondary | ICD-10-CM | POA: Insufficient documentation

## 2019-02-19 MED ORDER — PREDNISONE 50 MG PO TABS: 50.0000 mg | ORAL_TABLET | Freq: Every day | ORAL | 0 refills | Status: DC

## 2019-02-19 MED ORDER — PREDNISONE 20 MG PO TABS
40.0000 mg | ORAL_TABLET | Freq: Once | ORAL | Status: AC
  Administered 2019-02-19: 40 mg via ORAL
  Filled 2019-02-19: qty 2

## 2019-02-19 MED ORDER — DOXYCYCLINE HYCLATE 100 MG PO CAPS: 100.0000 mg | ORAL_CAPSULE | Freq: Two times a day (BID) | ORAL | 0 refills | Status: DC

## 2019-02-19 MED ORDER — ALBUTEROL SULFATE HFA 108 (90 BASE) MCG/ACT IN AERS
1.0000 | INHALATION_SPRAY | Freq: Once | RESPIRATORY_TRACT | Status: AC
  Administered 2019-02-19: 11:00:00 2 via RESPIRATORY_TRACT
  Filled 2019-02-19: qty 6.7

## 2019-02-19 NOTE — ED Triage Notes (Addendum)
Pt presents with SOB/Asthma since Feb; pt states that she was dx with PNA and tx with ABTs that were not completed   Pt states it was prolonged until now and has gotten worse; Pt states that she did have a bad case of the flu as well.

## 2019-02-19 NOTE — Patient Instructions (Signed)
Sent to ED

## 2019-02-19 NOTE — Discharge Instructions (Signed)
Please read attached information. If you experience any new or worsening signs or symptoms please return to the emergency room for evaluation. Please follow-up with your primary care provider or specialist as discussed. Please use medication prescribed only as directed and discontinue taking if you have any concerning signs or symptoms.   °

## 2019-02-19 NOTE — Progress Notes (Signed)
   CC: SOB, asthma   HPI  Didn't finish amox from ED 2/2 emesis Thought she had the flu before the ED encounter in February, no confirmed test Last course of steroids ended about 2 weeks ago Works at [employer], but no known covid exposure  Doesn't feel as bad as previous ICU admissions, but does feel like previous hospital admissions.  Last prednisone 4/6 as charted Last neb 1.5 hrs ago Cannot ambulate without increased WOB and wheezing No fevers, no sputum production Feels like she has been having several flares over the last few months and hasn't gotten totally better.   ROS: Denies CP, SOB, abdominal pain, dysuria, changes in BMs.   CC, SH/smoking status, and VS noted  Objective: BP 110/60   Pulse 71   SpO2 98%  Gen: NAD, alert, cooperative, and pleasant. HEENT: NCAT, EOMI, PERRL CV: RRR, no murmur Resp:diffuse insp and expiratory wheezing, pausing mid sentence, no accessory muscle use. No decreased breath sounds in any areas.  Abd: SNTND, BS present, no guarding or organomegaly Ext: No edema, warm Neuro: Alert and oriented, Speech clear, No gross deficits  Assessment and plan:  Sent to ED for CXR and obvs, possible admission pending ED eval. Pt has required multiple admission for asthma in the past. Gave report to ED charge RN.  No known COVID exposure. Not in distress here. My CMA will wheel over to ED. Considered ambulatory pulse ox, but would still want ED eval for CXR, so did not complete this.   Loni Muse, MD, PGY3 02/19/2019 9:34 AM

## 2019-02-19 NOTE — TOC Initial Note (Signed)
Transition of Care [facility]) - Initial/Assessment Note    Patient Details  Name: Lydia Griffin MRN: [id number] Date of Birth: Sep 02, 1961  Transition of Care [facility]) CM/SW Contact:    Oletta Cohn, RN Phone Number: 02/19/2019, 10:34 AM  Clinical Narrative:                 [facility] following for disposition needs.  Pt goes to [facility] for PCP needs.  Expected Discharge Plan: Home/Self Care Barriers to Discharge: Barriers Resolved   Patient Goals and CMS Choice Patient states their goals for this hospitalization and ongoing recovery are:: breathe better      Expected Discharge Plan and Services Expected Discharge Plan: Home/Self Care   Discharge Planning Services: Follow-up appt scheduled   Living arrangements for the past 2 months: Apartment                                      Prior Living Arrangements/Services Living arrangements for the past 2 months: Apartment Lives with:: Self   Do you feel safe going back to the place where you live?: Yes               Activities of Daily Living      Permission Sought/Granted Permission sought to share information with : PCP                Emotional Assessment Appearance:: Appears stated age Attitude/Demeanor/Rapport: Ambitious Affect (typically observed): Appropriate Orientation: : Oriented to Self, Oriented to Place, Oriented to  Time, Oriented to Situation Alcohol / Substance Use: Never Used Psych Involvement: No (comment)  Admission diagnosis:  ASTHMA Patient Active Problem List   Diagnosis Date Noted  . Losing weight 07/05/2018  . Generalized anxiety disorder 07/05/2018  . Nasal abscess 08/13/2017  . Asthma exacerbation 05/02/2017  . Healthcare maintenance 02/07/2017  . Screening cholesterol level 10/25/2016  . Insomnia 12/30/2012  . Asthma with acute exacerbation 08/15/2011  . Asthma, chronic 11/02/2009  . Overweight(278.02) 12/20/2006  . Moderate episode of recurrent major depressive  disorder (HCC) 12/20/2006   PCP:  Tillman Sers, DO Pharmacy:   [facility] [address], [place] - [address] [place] [address] Phone: [phone] Fax: [phone]  [facility] - [place], [place] - [address] [place] [address] Phone: [phone] Fax: [phone]  [id number] [place], [place] - [address] AT [facility] [address] [facility] [address] [place] [address] Phone: [phone] Fax: [phone]     Social Determinants of Health (SDOH) Interventions    Readmission Risk Interventions No flowsheet data found.

## 2019-02-19 NOTE — ED Provider Notes (Signed)
[facility] EMERGENCY DEPARTMENT Provider Note   CSN: [id number] Arrival date & time: 02/19/19  1003   History   Chief Complaint Chief Complaint  Patient presents with  . Shortness of Breath    HPI Lydia Griffin is a 58 y.o. female.     HPI   24 YOF presents today with complaints of asthma exacerbation.  She notes a significant past medical history of the same.  She notes that in January she had symptoms that were consistent with the flu.  She started improving this but had persisting times.  She went to the emergency room in February where she was diagnosed with pneumonia.  She notes she was placed on Augmentin at that time.  She took 2 days of the Augmentin but developed diarrhea and subsequently stopped the antibiotic.  She did not restart any other antibiotic.  She notes she has had waxing and waning of her symptoms.  She describes them as similar to her asthma flares, but more persistent.  She notes that she has been using prednisone, Singulair, albuterol, Dulera throughout the last several months with varying levels of effectiveness.  Notes her last dose of prednisone was approximately 1 week ago.  Denies any fever productive cough.  She denies any history DVT or PE or any significant risk factors.  She notes that her symptoms are worse when she spends time out of the house and improved when she is at home.  Known close COVID nauseated patients.  She notes that she sleeps better lying flat.    Past Medical History:  Diagnosis Date  . Asthma    Started as a child; Has been intubated for asthma exacerbation twice in approx 1990  . Shortness of breath    "related to asthma attacks" (09/19/2012)    Patient Active Problem List   Diagnosis Date Noted  . Losing weight 07/05/2018  . Generalized anxiety disorder 07/05/2018  . Nasal abscess 08/13/2017  . Asthma exacerbation 05/02/2017  . Healthcare maintenance 02/07/2017  . Screening cholesterol level 10/25/2016   . Insomnia 12/30/2012  . Asthma with acute exacerbation 08/15/2011  . Asthma, chronic 11/02/2009  . Overweight(278.02) 12/20/2006  . 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 hand     flexor tendon repair  . TUBAL LIGATION  ~ 1985     OB History   No obstetric history on file.      Home Medications    Prior to Admission medications   Medication Sig Start Date End Date Taking? Authorizing Provider  albuterol (PROAIR HFA) 108 (90 Base) MCG/ACT inhaler Inhale 2 puffs into the lungs every 6 (six) hours as needed for wheezing or shortness of breath. 06/05/18   Dolores Patty C, DO  albuterol (PROVENTIL) (2.5 MG/3ML) 0.083% nebulizer solution Take 3 mLs (2.5 mg total) by nebulization every 4 (four) hours as needed for wheezing or shortness of breath. 10/19/17   Tillman Sers, DO  amoxicillin-clavulanate (AUGMENTIN) 875-125 MG tablet Take 1 tablet by mouth every 12 (twelve) hours. 11/26/18   Bethel Born, PA-C  Biotin 1 MG CAPS Take 1 mg by mouth daily.    [provider]  Cyanocobalamin (VITAMIN B 12 PO) Take 1,000 mcg by mouth daily.    [provider]  doxycycline (VIBRAMYCIN) 100 MG capsule Take 1 capsule (100 mg total) by mouth 2 (two) times daily. 02/19/19   Eyvonne Mechanic, PA-C  fluticasone (FLONASE) 50 MCG/ACT nasal spray Place 2 sprays into both nostrils daily. 02/28/18   Garth Bigness, MD  ipratropium-albuterol (DUONEB) 0.5-2.5 (3) MG/3ML SOLN Take 3 mLs by nebulization every 4 (four) hours as needed. 06/10/18   Oralia Manis, DO  loratadine (CLARITIN) 10 MG tablet Take 1 tablet (10 mg total) by mouth daily. 02/16/14   Charlane Ferretti, MD  mometasone-formoterol (DULERA) 200-5 MCG/ACT AERO Inhale 2 puffs into the lungs 2 (two) times daily. 11/21/17   Tillman Sers, DO  montelukast (SINGULAIR) 10 MG tablet Take 1 tablet (10 mg total) by mouth at bedtime. 07/02/17    Tillman Sers, DO  Multiple Vitamins-Minerals (MULTIVITAMIN WITH MINERALS) tablet Take 1 tablet by mouth daily.    [provider]  predniSONE (DELTASONE) 50 MG tablet Take 1 tablet (50 mg total) by mouth daily. 02/19/19   Eyvonne Mechanic, PA-C    Family History Family History  Problem Relation Age of Onset  . Asthma Mother   . Heart disease Father 91       MI  . Alcohol abuse Father   . Asthma Sister 9       death from asthma    Social History Social History   Tobacco Use  . Smoking status: Never Smoker  . Smokeless tobacco: Never Used  Substance Use Topics  . Alcohol use: No  . Drug use: Yes    Comment: Pt states she "smoked a lot of weed" as a teenager, but has not used marijuana for >30 years.     Allergies   Patient has no known allergies.   Review of Systems Review of Systems  All other systems reviewed and are negative.    Physical Exam Updated Vital Signs BP 139/89 (BP Location: Right Arm)   Pulse 63   Temp 98.6 F (37 C) (Oral)   Resp 16   SpO2 100%   Physical Exam Vitals signs and nursing note reviewed.  Constitutional:      Appearance: She is well-developed.  HENT:     Head: Normocephalic and atraumatic.  Eyes:     General: No scleral icterus.       Right eye: No discharge.        Left eye: No discharge.     Conjunctiva/sclera: Conjunctivae normal.     Pupils: Pupils are equal, round, and reactive to light.  Neck:     Musculoskeletal: Normal range of motion.     Vascular: No JVD.     Trachea: No tracheal deviation.  Pulmonary:     Effort: Pulmonary effort is normal. No respiratory distress.     Breath sounds: No stridor. Wheezing present. No rhonchi or rales.     Comments: Bilateral inspiratory and expiratory wheeze, no crackles noted Musculoskeletal:        General: No swelling.  Neurological:     Mental Status: She is alert and oriented to person, place, and time.     Coordination: Coordination normal.  Psychiatric:         Behavior: Behavior normal.        Thought Content: Thought content normal.        Judgment: Judgment normal.      ED Treatments / Results  Labs (all labs ordered are listed, but only abnormal results are displayed) Labs Reviewed - No data to display  EKG None  Radiology Dg Chest 2 View  Result Date: 02/19/2019 CLINICAL DATA:  Asthma.  Shortness of breath.  Recent pneumonia. EXAM: CHEST -  2 VIEW COMPARISON:  Two-view chest x-ray 11/25/2018 FINDINGS: Heart size is normal. Chronic interstitial coarsening is again noted. Previously noted left lower lobe pneumonia has resolved. There is no edema or effusion. No new focal airspace disease is present. The visualized soft tissues and bony thorax are unremarkable. IMPRESSION: 1. Interval resolution of left lower lobe pneumonia. 2. Chronic interstitial coarsening is stable, consistent with reactive airways disease. Electronically Signed   By: Marin Robertshristopher  Mattern M.D.   On: 02/19/2019 10:59    Procedures Procedures (including critical care time)  Medications Ordered in ED Medications  predniSONE (DELTASONE) tablet 40 mg (40 mg Oral Given 02/19/19 1102)  albuterol (VENTOLIN HFA) 108 (90 Base) MCG/ACT inhaler 1-2 puff (2 puffs Inhalation Given 02/19/19 1102)     Initial Impression / Assessment and Plan / ED Course  I have reviewed the triage vital signs and the nursing notes.  Pertinent labs & imaging results that were available during my care of the patient were reviewed by me and considered in my medical decision making (see chart for details).       Labs:   Imaging: DG chest 2 view  Consults:  Therapeutics: Albuterol  Discharge Meds: Doxycycline, prednisone  Assessment/Plan: 58 year old female presents today with complaints of asthma exacerbation.  Patient does have wheezing on exam.  This improved with albuterol inhaler here.  Although patient does not have any objective signs of infection presently, symptoms did start with  infectious etiology and chest x-ray with pneumonia.  Her checks x-ray does show improvement, but patient notes that this has remained more persistent than previous asthma exacerbations.  I do find it reasonable given the ongoing symptoms to treat with an antibiotic.  Personally ambulated the patient in the hallway with her oxygen saying between 96 and 98, no signs of dyspnea.  Patient will be given doxycycline.  She will continue using Dulera, Singulair and albuterol at home.  If she develops any new or worsening signs or symptoms she will return immediately.  Patient is in the process of following up as an outpatient with pulmonology.  Patient verbalized understanding and agreement to today's plan.    Final Clinical Impressions(s) / ED Diagnoses   Final diagnoses:  Moderate asthma with exacerbation, unspecified whether persistent  Respiratory infection    ED Discharge Orders         Ordered    doxycycline (VIBRAMYCIN) 100 MG capsule  2 times daily     02/19/19 1148    predniSONE (DELTASONE) 50 MG tablet  Daily     02/19/19 1148           Eyvonne MechanicHedges, Desiree Fleming, PA-C 02/19/19 1521    Gwyneth SproutPlunkett, Whitney, MD 02/23/19 1948

## 2019-02-20 ENCOUNTER — Telehealth (INDEPENDENT_AMBULATORY_CARE_PROVIDER_SITE_OTHER): Payer: Self-pay | Admitting: Family Medicine

## 2019-02-20 DIAGNOSIS — J4541 Moderate persistent asthma with (acute) exacerbation: Secondary | ICD-10-CM

## 2019-02-20 MED ORDER — TIOTROPIUM BROMIDE MONOHYDRATE 1.25 MCG/ACT IN AERS: 2.5000 ug | INHALATION_SPRAY | Freq: Every day | RESPIRATORY_TRACT | 2 refills | Status: DC

## 2019-02-20 MED ORDER — DOXYCYCLINE HYCLATE 100 MG PO CAPS: 100.0000 mg | ORAL_CAPSULE | Freq: Two times a day (BID) | ORAL | 0 refills | Status: DC

## 2019-02-20 NOTE — Progress Notes (Signed)
[facility] Telemedicine Visit  Patient consented to have virtual visit. Method of visit: Video was attempted, but technology challenges prevented patient from using video, so visit was conducted via telephone.  Encounter participants: Patient: ALEIGHYA FENERTY - located at home Provider: Leland Her - located at [facility] Others (if applicable): none  Chief Complaint: ED follow up  HPI:  Was not able to pick up doxycycline at [facility] due to cost and needs it sent to [facility]. Was able to afford the prednisone. She is taking her dulera, singular, and albuterol.  She does feel slightly better as she is no longer short of breath at rest.  But still has a lot of wheezing and and shortness of breath with any exertion.  She is frustrated because she asthma was previously under control on this medication regimen until she had a flu like illness in February. Ever since every small thing seems to trigger her asthma.  Denies fever, nausea.   ROS: per HPI  Pertinent PMHx: asthma  Exam:  Respiratory: increased work of breathing with audible wheezing but able to speak in full sentences  Assessment/Plan:  Asthma with acute exacerbation Sent doxycycline to patient preferred pharmacy. Discussed continuing prednisone burst. Patient will continue Dulera and singular as well as her albuterol. However since she has had frequent exacerbations over the last few months will add Spiriva as adjunct.     Time spent during visit with patient: 24 minutes  Leland Her, DO PGY-3, [facility] 02/20/2019 9:32 AM

## 2019-02-20 NOTE — Assessment & Plan Note (Signed)
Sent doxycycline to patient preferred pharmacy. Discussed continuing prednisone burst. Patient will continue Dulera and singular as well as her albuterol. However since she has had frequent exacerbations over the last few months will add Spiriva as adjunct.

## 2019-03-12 ENCOUNTER — Other Ambulatory Visit: Payer: Self-pay

## 2019-03-12 ENCOUNTER — Encounter: Payer: Self-pay | Admitting: Family Medicine

## 2019-03-12 ENCOUNTER — Telehealth (INDEPENDENT_AMBULATORY_CARE_PROVIDER_SITE_OTHER): Payer: Self-pay | Admitting: Family Medicine

## 2019-03-12 ENCOUNTER — Telehealth: Payer: Self-pay | Admitting: *Deleted

## 2019-03-12 VITALS — BP 126/68 | Wt 165.0 lb

## 2019-03-12 DIAGNOSIS — J4550 Severe persistent asthma, uncomplicated: Secondary | ICD-10-CM

## 2019-03-12 MED ORDER — IPRATROPIUM-ALBUTEROL 0.5-2.5 (3) MG/3ML IN SOLN: 3.0000 mL | RESPIRATORY_TRACT | 1 refills | Status: DC | PRN

## 2019-03-12 NOTE — Telephone Encounter (Signed)
Pt needs for [place] to call [name] @ [phone] to approve more refills of dulera and albuterol.    Pt is approved for financial assistance thru [name] until August but needs refills called in.  Will forward to MD. Jone Baseman, CMA

## 2019-03-12 NOTE — Assessment & Plan Note (Signed)
Continues to be poorly controlled, has not yet started spiriva due to cost, does not have insurance. No respiratory distress appreciated over the phone but obviously resp exam is limited. Will prescribe duoneb so patient will have SAMA with SABA. Will reach out to pharmacy to see about getting patient set up with PAP to help afford spiriva. Patient will make in-person appointment to be more thoroughly evaluated for cardiac concerns, though low likelihood. Return and emergency precautions reviewed.

## 2019-03-12 NOTE — Progress Notes (Signed)
[facility]medicine Visit  Patient consented to have virtual visit. Method of visit: Telephone  Encounter participants: Patient: Lydia Griffin - located at home Provider: Ellwood Dense - located at [facility] Others (if applicable): N/A  Chief Complaint: congestion  HPI:  Had flu-like illness in February and since has had difficulties controlling her asthma. Takes dulera, singulair, prn albuterol at home. Using albuterol every 4 hours, used one neb so far today, usually using >2-3x per day. 2 ED visits in the last 6 months (1 for CAP in 02/20, 1 for asthma exacerbation). Previously on augmentin and doxycycline, most recently beginning of this month with prednisone burst. Felt better last week but still has difficulties with normal daily activities. Yesterday walked halfway up her street and was short of breath, gasping for air. Has trouble sweeping the floor. Prescribed spiriva in addition to regimen listed above but cannot afford. She works at an [employer] but has been out of work since 3/23. She denies known COVID exposure.  No fevers, chills. Having some sweats but this has been going on prior to her pneumonia and flu-like illness. She is concerned she has heart issues since she is continually having difficulties breathing.  Does have extensive h/o of hospital admissions with prior ICU admission and intubation. Common triggers for her asthma are respiratory illnesses, pollen, rainy weather.  ROS: per HPI  Pertinent PMHx: asthma, overweight, MDD, insomnia, GAD  Exam:  Respiratory: no resp distress, speaks in full sentences. Sounds congested. Intermittently tearful.   Assessment/Plan:  Asthma, chronic Continues to be poorly controlled, has not yet started spiriva due to cost, does not have insurance. No respiratory distress appreciated over the phone but obviously resp exam is limited. Will prescribe duoneb so patient will have SAMA with SABA. Will reach out to  pharmacy to see about getting patient set up with PAP to help afford spiriva. Patient will make in-person appointment to be more thoroughly evaluated for cardiac concerns, though low likelihood. Return and emergency precautions reviewed.   Time spent during visit with patient: 20 minutes

## 2019-03-13 ENCOUNTER — Telehealth: Payer: Self-pay | Admitting: *Deleted

## 2019-03-13 ENCOUNTER — Other Ambulatory Visit: Payer: Self-pay | Admitting: Family Medicine

## 2019-03-13 ENCOUNTER — Telehealth: Payer: Self-pay | Admitting: Pharmacist

## 2019-03-13 MED ORDER — PREDNISONE 50 MG PO TABS: 50.0000 mg | ORAL_TABLET | Freq: Every day | ORAL | 0 refills | Status: DC

## 2019-03-13 NOTE — Telephone Encounter (Signed)
Received inbasket from Dr. Linwood Dibbles asking for assistance in obtaining patient assistance inhalers for this patient, after visit yesterday.   Patient is approved for [facility] and Proventil through [employer] patient assistance through August. Provided verbal refill (see 5/20 phone note).   Patient is eligible to receive Spiriva through [employer] patient assistance. Started paperwork process; will leave provider portion for Dr. Tyron Russell signature.   Contacted patient to discuss this process and needing her signature on Spiriva application, as well as proof of income. Left HIPAA compliant message for patient to return my call at her convenience.

## 2019-03-13 NOTE — Telephone Encounter (Signed)
Sent prednisone Rx to pharmacy.

## 2019-03-13 NOTE — Telephone Encounter (Signed)
Completed. See 03/13/2019 phone note.

## 2019-03-13 NOTE — Telephone Encounter (Signed)
Pt lm on nurse line, requesting prednisone (she thought she was to get this at her appt yesterday).  She was also told by [facility] that there was an issue with the duoneb.  Advised that I would do the following:  1. Send message to MD about prednisone  2. Contact [facility] and find out the issue with Duoneb.   - issue is that we sent in and the script only come in 35ml.  Gave verbal to change and                            was told it will be $41.  Pt informed and agreeable to price.  Will forward to MD for prednisone question.  Jone Baseman, CMA

## 2019-03-19 NOTE — Telephone Encounter (Signed)
I haven't seen any provider paperwork for me to fill out for this- just [facility]

## 2019-03-20 NOTE — Telephone Encounter (Signed)
Received Dr. Tyron Russell signature on BI application for Spiriva.   Contacted patient; explained that patient portion is at the front desk of clinic for her signature. She will come by today to sign and leave proof of income.   She also noted that she spoke with [name], and will be receiving Dulera and Proventil in the mail shortly. They also noted that her approval expires in August, so they went ahead and sent her a re-enrollment application. She is going to drop that off today as well for Dr. Tyron Russell completion and signature.   Patient has my direct line for future questions or concerns. She was extremely grateful for the support she has received from [facility] over the years.

## 2019-03-27 NOTE — Telephone Encounter (Signed)
Patient came by clinic today, signed on [employer] application for Spiriva. I faxed this into the company; will follow up in 7-10 business days.  Patient dropped off [name] re-application for [employer]; completed application and put in Dr. Tyron Russell box for her signature. Once signed, can be placed in the attached envelope and mailed to [employer].   Patient asked for a refill of montelukast. She notes that she has been out of it for ~1.5 years, but felt like it was beneficial to her breathing. Denied any side effects from montelukast therapy. She has been using loratadine QHS since then, and was unaware she could use both in combination. Asked if montelukast could be refilled - will route to Dr. Wonda Olds for this.   Catie Feliz Beam, PharmD, CPP PGY2 Ambulatory Care Pharmacy Resident, [facility] Phone: [phone]

## 2019-03-28 MED ORDER — MONTELUKAST SODIUM 10 MG PO TABS: 10.0000 mg | ORAL_TABLET | Freq: Every day | ORAL | 3 refills | Status: DC

## 2019-03-28 NOTE — Telephone Encounter (Signed)
  Refilled singulair Will sign form next week, on night shifts currently

## 2019-03-31 ENCOUNTER — Telehealth: Payer: Self-pay

## 2019-03-31 NOTE — Telephone Encounter (Signed)
Attempted to call Lydia Griffin back, left voicemail for her that I tried to call. She is higher risk given her severe asthma. It is reassuring that the patient tested negative the second time and that would hopefully convey they are no longer infectious. It is hard to say. She is in a difficult position which I appreciate. I would suggest if she feels unsafe or uncomfortable to speak with her workplace superiors to be reassigned to different patient or placed in a non-clinical role and as always I'm happy to write a letter or fill out forms if she needs me to.

## 2019-03-31 NOTE — Telephone Encounter (Signed)
Pt calling to speak with Dr. Vanetta Shawl about her work situation. She has a pt that has tested pos for COVID and has now tested Negative. How safe is it for Lydia Griffin to be around the patient?  Lydia Griffin is very nervous and wants to hear what Dr. Lyndal Pulley has to say. Please call her on [phone]. Ottis Stain, CMA

## 2019-04-02 NOTE — Telephone Encounter (Signed)
Form filled out and placed in mail  I did receive a letter from the medication assistance program and was unsure what it meant- it's in my inbox on top of everything else if you could take a look at it for me.

## 2019-04-02 NOTE — Telephone Encounter (Signed)
Attempted to call Ms. Sigg, got her voicemail, left message stating the following:  If patient was swabbed again for acute covid infection and it was positive that would not be due to antibodies and would indicate active infection  I would not want her working with this patient given here severe asthma.   Again can fill out forms or write letters if she needs me to.

## 2019-04-02 NOTE — Telephone Encounter (Signed)
Pt stated that the same pt has tested positive again but they say its due to his antibodies. So pt wants to know does that mean he is infectious if he is now testing positive due to his antibodies. Salvatore Marvel, CMA

## 2019-04-03 NOTE — Telephone Encounter (Signed)
[place]. They needed clarification on dosing of Spriva. Clarified and faxing application back in.

## 2019-04-04 NOTE — Telephone Encounter (Signed)
Attempted to call pt. LVM stating the note left by Dr. Vanetta Shawl.  I asked pt to call back if she has any other questions or concerns. Salvatore Marvel, CMA

## 2019-04-10 ENCOUNTER — Telehealth: Payer: Self-pay | Admitting: Pharmacist

## 2019-04-10 NOTE — Telephone Encounter (Signed)
[place]. Patient was APPROVED for Spiriva patient assistance through 04/07/2020. She can receive any medications made by [employer] for free during this time.   Contacted patient to notify, she expressed appreciation.   Routing to PCP for FYI.

## 2019-06-07 ENCOUNTER — Telehealth: Payer: Self-pay | Admitting: Family Medicine

## 2019-06-07 MED ORDER — PREDNISONE 50 MG PO TABS: 50.0000 mg | ORAL_TABLET | Freq: Every day | ORAL | 0 refills | Status: DC

## 2019-06-07 NOTE — Telephone Encounter (Signed)
**  After Hours/ Emergency Line Call**  Received a page to call [phone] - [phone].  Patient: Lydia Griffin  Caller: Self  Confirmed name & DOB of patient with caller  Subjective:  Caller reports trouble breathing. No fevers.  Patient reports that she has had trouble breathing for the last couple of days.  She reports that she has been struggling with her asthma especially for the last few months with COVID.  Currently, she is treating herself at home and does not feel that she needs to go to the ED just yet. Breathing is worse with mobility.  She reports that she can barely get to the bathroom now.  She went to the kitchen earlier for food and couldn't eat her food she was so out of breath. Albuterol inhaler q2-3 hours. Yesterday used 6 nebulizer, today had 3.  Patient does reports improvement with albuterol treatment.  Last ED visit for asthma was in mid April.  She reports that she has not been using her Spiriva and Dulera daily.  She reports that she has not been able to get to the clinic due to [place] recently.  She does report that she has recently filled her prescription for duo nebs to use through her nebulizer.  Per chart review, it appears that patient has had to be intubated in the past with 10 ICU admissions and greater than 20 admissions to the floor.  If she goes see the emergency department, she typically gets admitted.  Objective:  Observations: Able to finish full sentences but is out of breath at the end of sentences.  Coughing intermittently.  Some intermittent shallow breathing.  Assessment & Plan  Lydia Griffin is a 58 y.o. female with PMHx of severe asthma who calls with the following complaints and concerns: Asthma exacerbation.    -- Recommendations: As patient prefers to not go to the hospital or to the ED at this time, she would like to continue treatment at home. 1. Prednisone 50mg  x 5 days.  2. Take Spiriva and Dulera daily 3. Scheduled albuterol every 2 hours while awake  even if not short of breath at the time with as needed albuterol in between. 4. Go to the ED if no improvement with albuterol.  Very low threshold to go to the ED  -- Will forward to PCP.  Wilber Oliphant, M.D.  PGY-2  [facility] 06/07/2019 3:32 PM

## 2019-06-16 ENCOUNTER — Telehealth: Payer: Self-pay | Admitting: Family Medicine

## 2019-06-16 ENCOUNTER — Other Ambulatory Visit: Payer: Self-pay

## 2019-06-16 NOTE — Progress Notes (Signed)
[facility] Visit  Attempted to contact patient via telephone and text message with no response.   Harolyn Rutherford, DO [facility], PGY-3

## 2019-06-23 ENCOUNTER — Other Ambulatory Visit: Payer: Self-pay

## 2019-06-23 ENCOUNTER — Telehealth: Payer: Self-pay | Admitting: Family Medicine

## 2019-06-23 NOTE — Progress Notes (Signed)
Called patient multiple times with no answer.  Left message if she called prior to 1130 we would still do telephone visit.  Patient never returned call.  No charge for visit.

## 2019-07-11 ENCOUNTER — Telehealth: Payer: Self-pay | Admitting: *Deleted

## 2019-07-11 NOTE — Telephone Encounter (Signed)
Patient would need to be seen or have virtual visit to discuss her cramping. She may also need to have a magnesium level before I can recommend her taking a certain level.   Thanks, Dr. Enid Derry

## 2019-07-11 NOTE — Telephone Encounter (Signed)
Pt calls to see how much OTC magnesium she should take.  She is having some leg cramping that she thinks is due to low magnesium. To PCP. Christen Bame, CMA

## 2019-07-14 ENCOUNTER — Encounter: Payer: Self-pay | Admitting: Physician Assistant

## 2019-07-14 ENCOUNTER — Encounter: Payer: Self-pay | Admitting: Family Medicine

## 2019-07-14 ENCOUNTER — Other Ambulatory Visit: Payer: Self-pay

## 2019-07-14 ENCOUNTER — Ambulatory Visit (INDEPENDENT_AMBULATORY_CARE_PROVIDER_SITE_OTHER): Payer: Self-pay | Admitting: Family Medicine

## 2019-07-14 VITALS — BP 114/60 | HR 63 | Wt 169.0 lb

## 2019-07-14 DIAGNOSIS — R252 Cramp and spasm: Secondary | ICD-10-CM

## 2019-07-14 DIAGNOSIS — Z1239 Encounter for other screening for malignant neoplasm of breast: Secondary | ICD-10-CM

## 2019-07-14 DIAGNOSIS — Z1211 Encounter for screening for malignant neoplasm of colon: Secondary | ICD-10-CM

## 2019-07-14 DIAGNOSIS — R634 Abnormal weight loss: Secondary | ICD-10-CM

## 2019-07-14 DIAGNOSIS — J4550 Severe persistent asthma, uncomplicated: Secondary | ICD-10-CM

## 2019-07-14 MED ORDER — PREDNISONE 20 MG PO TABS: 40.0000 mg | ORAL_TABLET | Freq: Every day | ORAL | 0 refills | Status: DC

## 2019-07-14 MED ORDER — IPRATROPIUM-ALBUTEROL 0.5-2.5 (3) MG/3ML IN SOLN: 3.0000 mL | RESPIRATORY_TRACT | 1 refills | Status: DC | PRN

## 2019-07-14 MED ORDER — LORATADINE 10 MG PO TABS: 10.0000 mg | ORAL_TABLET | Freq: Every day | ORAL | 5 refills | Status: DC

## 2019-07-14 NOTE — Patient Instructions (Addendum)
Thank you for coming to see me today. It was a pleasure! Today we talked about:   I will call you with your lab results. For your asthma, I have given you prednisone 40mg  to take for 5 days. Please continue to take your other medications. Call if you have worsening shortness of breath and go tot he ED if it is after hours.   Please schedule your mammogram and colonoscopy.  Please follow-up with me in 6 months or sooner as needed.  If you have any questions or concerns, please do not hesitate to call the office at [phone].  Take Care,   [place] Teniya Filter, DO

## 2019-07-14 NOTE — Progress Notes (Signed)
Subjective:  Patient ID: Lydia Griffin  DOB: 09-30-1961 MRN: [id number]  Lydia Griffin is a 58 y.o. female with a PMH of asthma, MDD, insomnia, GAD, here today for asthma.   HPI:  Asthma: - Back in April given doxycycline, could not take - Appetite has decreased- lost 15 lbs unintentionally - extreme muscle cramps and insomnia - started eating edibles to sleep and eat - back to work since [employer] and drinking ensure - taking vitamins - 4 days ago got magnesium and since then has used albuterol 3- 4 times per day, and she has been dulera and spiriva and singulair -Denies any fevers, chills -Reports that she is having some shortness of breath with exertion that she believes is due to her asthma she also notices wheezing.  Denies any chest pain  Unintentional weight loss -As mentioned above patient reports that she has lost 15 pounds due to not having an appetite.  She states that this is been going on for the past 5 to 6 months.  She states that she is not up-to-date on her colonoscopy because the last provider made her feel bad about not having enough money to afford this test. -Patient states that she knows that she should be getting her regular screening.  She is also not up-to-date on her mammogram. -Patient reports that she has never smoked -She denies any abdominal pain, nausea, vomiting, diarrhea, constipation, trouble swallowing  ROS: All other systems otherwise negative, except as mentioned in HPI  Family hx: no family hx of colon cancer  Social hx: Denies use of illicit drugs, alcohol use Smoking status reviewed  Patient Active Problem List   Diagnosis Date Noted  . Losing weight 07/05/2018  . Generalized anxiety disorder 07/05/2018  . Insomnia 12/30/2012  . Asthma, chronic 11/02/2009  . Moderate episode of recurrent major depressive disorder (HCC) 12/20/2006     Objective:  BP 114/60   Pulse 63   Wt 169 lb (76.7 kg)   SpO2 97%   BMI 27.28 kg/m   Vitals  and nursing note reviewed  General: NAD, pleasant Cardiac: RRR, normal heart sounds, no m/r/g Pulm: normal effort, expiratory wheezing noted throughout with good air exchange Extremities: no edema or cyanosis. WWP. Skin: warm and dry, no rashes noted Neuro: alert and oriented, no focal deficits Psych: normal affect, normal thought content  Assessment & Plan:   Asthma, chronic Patient currently with exacerbation.  We will give her steroid burst.  She is encouraged to continue using her albuterol as needed as well as her duo nebs as needed.  Patient and directed to continue her daily Spiriva, Dulera, montelukast as well as Claritin for her allergies. -Patient given strict return precautions and understanding was voiced -We will check CMP and magnesium given that patient has started taking magnesium supplements which she believes may have helped her during this exacerbation  Losing weight Patient recently worked up for this but has had continued weight loss.  Patient is not up-to-date on her cancer screening.  Patient believes that this may be due to her uncontrolled asthma. -Will refer for colonoscopy and mammogram and patient voiced understanding.   -Previously had TSH which was low however free T3 and T4 were normal.   -Could also be secondary to patient's depression however she reports that overall she is feeling much better and she is doing better financially.  Will obtain a PHQ 9 at follow-up visit  [place] Yadiel Aubry, DO Family Medicine Resident PGY-3

## 2019-07-14 NOTE — Telephone Encounter (Signed)
Patient has an appt with PCP at 9:30am.  Lydia Griffin

## 2019-07-15 LAB — COMPREHENSIVE METABOLIC PANEL
ALT: 13 IU/L (ref 0–32)
AST: 14 IU/L (ref 0–40)
Albumin/Globulin Ratio: 1.5 (ref 1.2–2.2)
Albumin: 4 g/dL (ref 3.8–4.9)
Alkaline Phosphatase: 125 IU/L — ABNORMAL HIGH (ref 39–117)
BUN/Creatinine Ratio: 12 (ref 9–23)
BUN: 11 mg/dL (ref 6–24)
Bilirubin Total: <0.2 mg/dL (ref 0.0–1.2)
CO2: 22 mmol/L (ref 20–29)
Calcium: 9.4 mg/dL (ref 8.7–10.2)
Chloride: 103 mmol/L (ref 96–106)
Creatinine, Ser: 0.91 mg/dL (ref 0.57–1.00)
GFR calc Af Amer: 80 mL/min/{1.73_m2} (ref >59)
GFR calc non Af Amer: 69 mL/min/{1.73_m2} (ref >59)
Globulin, Total: 2.6 g/dL (ref 1.5–4.5)
Glucose: 87 mg/dL (ref 65–99)
Potassium: 4.9 mmol/L (ref 3.5–5.2)
Sodium: 135 mmol/L (ref 134–144)
Total Protein: 6.6 g/dL (ref 6.0–8.5)

## 2019-07-15 LAB — MAGNESIUM: Magnesium: 2.5 mg/dL — ABNORMAL HIGH (ref 1.6–2.3)

## 2019-07-16 NOTE — Assessment & Plan Note (Signed)
Patient currently with exacerbation.  We will give her steroid burst.  She is encouraged to continue using her albuterol as needed as well as her duo nebs as needed.  Patient and directed to continue her daily Spiriva, Dulera, montelukast as well as Claritin for her allergies. -Patient given strict return precautions and understanding was voiced -We will check CMP and magnesium given that patient has started taking magnesium supplements which she believes may have helped her during this exacerbation

## 2019-07-16 NOTE — Assessment & Plan Note (Addendum)
Patient recently worked up for this but has had continued weight loss.  Patient is not up-to-date on her cancer screening.  Patient believes that this may be due to her uncontrolled asthma. -Will refer for colonoscopy and mammogram and patient voiced understanding.   -Previously had TSH which was low however free T3 and T4 were normal.   -Could also be secondary to patient's depression however she reports that overall she is feeling much better and she is doing better financially.  Will obtain a PHQ 9 at follow-up visit

## 2019-07-24 ENCOUNTER — Other Ambulatory Visit: Payer: Self-pay

## 2019-07-24 ENCOUNTER — Encounter (HOSPITAL_COMMUNITY): Payer: Self-pay

## 2019-07-24 ENCOUNTER — Emergency Department (HOSPITAL_COMMUNITY)
Admission: EM | Admit: 2019-07-24 | Discharge: 2019-07-24 | Disposition: A | Discharge status: Home / Self Care | Payer: No Typology Code available for payment source | Attending: Emergency Medicine | Admitting: Emergency Medicine

## 2019-07-24 DIAGNOSIS — Z79899 Other long term (current) drug therapy: Secondary | ICD-10-CM | POA: Diagnosis not present

## 2019-07-24 DIAGNOSIS — Z8709 Personal history of other diseases of the respiratory system: Secondary | ICD-10-CM | POA: Diagnosis not present

## 2019-07-24 DIAGNOSIS — R519 Headache, unspecified: Secondary | ICD-10-CM | POA: Diagnosis not present

## 2019-07-24 DIAGNOSIS — M545 Low back pain: Secondary | ICD-10-CM | POA: Insufficient documentation

## 2019-07-24 DIAGNOSIS — R062 Wheezing: Secondary | ICD-10-CM | POA: Insufficient documentation

## 2019-07-24 DIAGNOSIS — M546 Pain in thoracic spine: Secondary | ICD-10-CM | POA: Diagnosis present

## 2019-07-24 MED ORDER — METHOCARBAMOL 500 MG PO TABS: 500.0000 mg | ORAL_TABLET | Freq: Two times a day (BID) | ORAL | 0 refills | Status: DC

## 2019-07-24 MED ORDER — LIDOCAINE 5 % EX PTCH: 1.0000 | MEDICATED_PATCH | CUTANEOUS | 0 refills | Status: DC

## 2019-07-24 MED ORDER — ALBUTEROL SULFATE HFA 108 (90 BASE) MCG/ACT IN AERS
2.0000 | INHALATION_SPRAY | Freq: Once | RESPIRATORY_TRACT | Status: AC
  Administered 2019-07-24: 2 via RESPIRATORY_TRACT
  Filled 2019-07-24: qty 6.7

## 2019-07-24 NOTE — Discharge Instructions (Addendum)
Expect your soreness to increase over the next 2-3 days. Take it easy, but do not lay around too much as this may make any stiffness worse.  °Antiinflammatory medications: Take 600 mg of ibuprofen every 6 hours or 440 mg (over the counter dose) to 500 mg (prescription dose) of naproxen every 12 hours for the next 3 days. After this time, these medications may be used as needed for pain. Take these medications with food to avoid upset stomach. Choose only one of these medications, do not take them together. °Acetaminophen (generic for Tylenol): Should you continue to have additional pain while taking the ibuprofen or naproxen, you may add in acetaminophen as needed. Your daily total maximum amount of acetaminophen from all sources should be limited to 4000mg/day for persons without liver problems, or 2000mg/day for those with liver problems. °Methocarbamol: Methocarbamol (generic for Robaxin) is a muscle relaxer and can help relieve stiff muscles or muscle spasms.  Do not drive or perform other dangerous activities while taking this medication as it can cause drowsiness as well as changes in reaction time and judgement. °Lidocaine patches: These are available via either prescription or over-the-counter. The over-the-counter option may be more economical one and are likely just as effective. There are multiple over-the-counter brands, such as Salonpas. °Exercises: Be sure to perform the attached exercises starting with three times a week and working up to performing them daily. This is an essential part of preventing long term problems.  °Follow up: Follow up with a primary care provider for any future management of these complaints. Be sure to follow up within 7-10 days. °Return: Return to the ED should symptoms worsen. ° °For prescription assistance, may try using prescription discount sites or apps, such as goodrx.com °
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
## 2019-07-25 ENCOUNTER — Ambulatory Visit: Payer: Self-pay | Admitting: Physician Assistant

## 2019-08-04 ENCOUNTER — Ambulatory Visit: Payer: Self-pay | Admitting: Physician Assistant

## 2019-08-16 ENCOUNTER — Encounter (HOSPITAL_COMMUNITY): Payer: Self-pay

## 2019-08-16 ENCOUNTER — Ambulatory Visit (HOSPITAL_COMMUNITY)
Admission: EM | Admit: 2019-08-16 | Discharge: 2019-08-16 | Disposition: A | Discharge status: Home / Self Care | Payer: Self-pay | Attending: Emergency Medicine | Admitting: Emergency Medicine

## 2019-08-16 ENCOUNTER — Other Ambulatory Visit: Payer: Self-pay

## 2019-08-16 DIAGNOSIS — Z79899 Other long term (current) drug therapy: Secondary | ICD-10-CM | POA: Insufficient documentation

## 2019-08-16 DIAGNOSIS — J45909 Unspecified asthma, uncomplicated: Secondary | ICD-10-CM | POA: Insufficient documentation

## 2019-08-16 DIAGNOSIS — R110 Nausea: Secondary | ICD-10-CM | POA: Insufficient documentation

## 2019-08-16 DIAGNOSIS — R197 Diarrhea, unspecified: Secondary | ICD-10-CM | POA: Insufficient documentation

## 2019-08-16 DIAGNOSIS — Z20828 Contact with and (suspected) exposure to other viral communicable diseases: Secondary | ICD-10-CM | POA: Insufficient documentation

## 2019-08-16 DIAGNOSIS — R6883 Chills (without fever): Secondary | ICD-10-CM | POA: Insufficient documentation

## 2019-08-16 DIAGNOSIS — R1030 Lower abdominal pain, unspecified: Secondary | ICD-10-CM | POA: Insufficient documentation

## 2019-08-16 DIAGNOSIS — R1084 Generalized abdominal pain: Secondary | ICD-10-CM

## 2019-08-16 LAB — POCT URINALYSIS DIP (DEVICE)
Bilirubin Urine: NEGATIVE
Glucose, UA: NEGATIVE mg/dL
Ketones, ur: NEGATIVE mg/dL
Leukocytes,Ua: NEGATIVE
Nitrite: NEGATIVE
Protein, ur: NEGATIVE mg/dL
Specific Gravity, Urine: 1.025 (ref 1.005–1.030)
Urobilinogen, UA: 0.2 mg/dL (ref 0.0–1.0)
pH: 6 (ref 5.0–8.0)

## 2019-08-16 MED ORDER — ONDANSETRON 4 MG PO TBDP
ORAL_TABLET | ORAL | Status: AC
  Filled 2019-08-16: qty 1

## 2019-08-16 MED ORDER — ONDANSETRON HCL 4 MG PO TABS: 4.0000 mg | ORAL_TABLET | Freq: Four times a day (QID) | ORAL | 0 refills | Status: DC

## 2019-08-16 MED ORDER — ONDANSETRON 4 MG PO TBDP
4.0000 mg | ORAL_TABLET | Freq: Once | ORAL | Status: AC
  Administered 2019-08-16: 15:00:00 4 mg via ORAL

## 2019-08-16 NOTE — ED Triage Notes (Addendum)
Pt states she is having diarrhea, lower  abdominal pain and nausea x 1 day.

## 2019-08-16 NOTE — Discharge Instructions (Addendum)
Take the prescribed antinausea medicine as directed.  Keep yourself hydrated with clear liquids such as Sprite or ginger ale.    Go to the emergency department if you have acute abdominal pain.    Follow-up with your primary care provider on Monday if your symptoms are not improving.

## 2019-08-16 NOTE — ED Notes (Signed)
Provided ice chips and sprite

## 2019-08-16 NOTE — ED Provider Notes (Signed)
[place]    CSN: [id number] Arrival date & time: 08/16/19  1316      History   Chief Complaint Chief Complaint  Patient presents with  . Diarrhea  . Abdominal Cramping  . Nausea    HPI Lydia Griffin is a 58 y.o. female.   Patient presents with diarrhea, chills, nausea, and abdominal pain since yesterday.  No treatments attempted at home.  She denies fever, sore throat, congestion, cough, shortness of breath, vomiting, constipation, rash, or other symptoms.  The history is provided by the patient.    Past Medical History:  Diagnosis Date  . Asthma    Started as a child; Has been intubated for asthma exacerbation twice in approx 1990  . Shortness of breath    "related to asthma attacks" (09/19/2012)    Patient Active Problem List   Diagnosis Date Noted  . Losing weight 07/05/2018  . Generalized anxiety disorder 07/05/2018  . Insomnia 12/30/2012  . Asthma, chronic 11/02/2009  . Moderate episode of recurrent major depressive disorder ([place]) 12/20/2006    Past Surgical History:  Procedure Laterality Date  . hand     flexor tendon repair  . TUBAL LIGATION  ~ 1985    OB History   No obstetric history on file.      Home Medications    Prior to Admission medications   Medication Sig Start Date End Date Taking? Authorizing Provider  albuterol (PROAIR HFA) 108 (90 Base) MCG/ACT inhaler Inhale 2 puffs into the lungs every 6 (six) hours as needed for wheezing or shortness of breath. 06/05/18   Lucila Maine C, DO  albuterol (PROVENTIL) (2.5 MG/3ML) 0.083% nebulizer solution Take 3 mLs (2.5 mg total) by nebulization every 4 (four) hours as needed for wheezing or shortness of breath. 10/19/17   Steve Rattler, DO  Biotin 1 MG CAPS Take 1 mg by mouth daily.    [provider]  Cyanocobalamin (VITAMIN B 12 PO) Take 1,000 mcg by mouth daily.    [provider]  fluticasone (FLONASE) 50 MCG/ACT nasal spray Place 2 sprays into both nostrils  daily. 02/28/18   Glenis Smoker, MD  ipratropium-albuterol (DUONEB) 0.5-2.5 (3) MG/3ML SOLN Take 3 mLs by nebulization every 4 (four) hours as needed. 07/14/19   Shirley, [place], DO  lidocaine (LIDODERM) 5 % Place 1 patch onto the skin daily. Remove & Discard patch within 12 hours or as directed by MD 07/24/19   Joy, Shawn C, PA-C  loratadine (CLARITIN) 10 MG tablet Take 1 tablet (10 mg total) by mouth daily. 07/14/19   Shirley, [place], DO  methocarbamol (ROBAXIN) 500 MG tablet Take 1 tablet (500 mg total) by mouth 2 (two) times daily. 07/24/19   Joy, Shawn C, PA-C  mometasone-formoterol (DULERA) 200-5 MCG/ACT AERO Inhale 2 puffs into the lungs 2 (two) times daily. 11/21/17   Steve Rattler, DO  montelukast (SINGULAIR) 10 MG tablet Take 1 tablet (10 mg total) by mouth at bedtime. 03/28/19   Steve Rattler, DO  Multiple Vitamins-Minerals (MULTIVITAMIN WITH MINERALS) tablet Take 1 tablet by mouth daily.    [provider]  ondansetron (ZOFRAN) 4 MG tablet Take 1 tablet (4 mg total) by mouth every 6 (six) hours. 08/16/19   Sharion Balloon, NP  predniSONE (DELTASONE) 20 MG tablet Take 2 tablets (40 mg total) by mouth daily with breakfast. 07/14/19   Shirley, [place], DO  Tiotropium Bromide Monohydrate 1.25 MCG/ACT AERS Inhale 2.5 mcg into the lungs daily.  02/20/19   Leland HerYoo, Elsia J, DO    Family History Family History  Problem Relation Age of Onset  . Asthma Mother   . Heart disease Father [id number]       MI  . Alcohol abuse Father   . Asthma Sister 9       death from asthma    Social History Social History   Tobacco Use  . Smoking status: Never Smoker  . Smokeless tobacco: Never Used  Substance Use Topics  . Alcohol use: No  . Drug use: Yes    Comment: Pt states she "smoked a lot of weed" as a teenager, but has not used marijuana for >30 years.     Allergies   Patient has no known allergies.   Review of Systems Review of Systems  Constitutional: Positive for chills. Negative for  fever.  HENT: Negative for ear pain and sore throat.   Eyes: Negative for pain and visual disturbance.  Respiratory: Negative for cough and shortness of breath.   Cardiovascular: Negative for chest pain and palpitations.  Gastrointestinal: Positive for abdominal pain, diarrhea and nausea. Negative for constipation and vomiting.  Genitourinary: Negative for dysuria and hematuria.  Musculoskeletal: Negative for arthralgias and back pain.  Skin: Negative for color change and rash.  Neurological: Negative for seizures and syncope.  All other systems reviewed and are negative.    Physical Exam Triage Vital Signs ED Triage Vitals  Enc Vitals Group     BP      Pulse      Resp      Temp      Temp src      SpO2      Weight      Height      Head Circumference      Peak Flow      Pain Score      Pain Loc      Pain Edu?      Excl. in GC?    No data found.  Updated Vital Signs BP 136/78 (BP Location: Left Arm)   Pulse 78   Temp 98.7 F (37.1 C)   Resp 18   SpO2 96%   Visual Acuity Right Eye Distance:   Left Eye Distance:   Bilateral Distance:    Right Eye Near:   Left Eye Near:    Bilateral Near:     Physical Exam Vitals signs and nursing note reviewed.  Constitutional:      General: She is not in acute distress.    Appearance: She is well-developed.  HENT:     Head: Normocephalic and atraumatic.     Right Ear: Tympanic membrane normal.     Left Ear: Tympanic membrane normal.     Nose: Nose normal.     Mouth/Throat:     Mouth: Mucous membranes are moist.     Pharynx: Oropharynx is clear.  Eyes:     Conjunctiva/sclera: Conjunctivae normal.  Neck:     Musculoskeletal: Neck supple.  Cardiovascular:     Rate and Rhythm: Normal rate and regular rhythm.     Heart sounds: No murmur.  Pulmonary:     Effort: Pulmonary effort is normal. No respiratory distress.     Breath sounds: Normal breath sounds.  Abdominal:     General: Bowel sounds are normal.      Palpations: Abdomen is soft.     Tenderness: There is no abdominal tenderness. There is no right CVA tenderness, left CVA tenderness,  guarding or rebound.  Skin:    General: Skin is warm and dry.     Findings: No rash.  Neurological:     General: No focal deficit present.     Mental Status: She is alert and oriented to person, place, and time.      UC Treatments / Results  Labs (all labs ordered are listed, but only abnormal results are displayed) Labs Reviewed  POCT URINALYSIS DIP (DEVICE) - Abnormal; Notable for the following components:      Result Value   Hgb urine dipstick MODERATE (*)    All other components within normal limits  NOVEL CORONAVIRUS, NAA (HOSP ORDER, SEND-OUT TO REF LAB; TAT 18-24 HRS)    EKG   Radiology No results found.  Procedures Procedures (including critical care time)  Medications Ordered in UC Medications  ondansetron (ZOFRAN-ODT) disintegrating tablet 4 mg (4 mg Oral Given 08/16/19 1437)  ondansetron (ZOFRAN-ODT) 4 MG disintegrating tablet (has no administration in time range)    Initial Impression / Assessment and Plan / UC Course  I have reviewed the triage vital signs and the nursing notes.  Pertinent labs & imaging results that were available during my care of the patient were reviewed by me and considered in my medical decision making (see chart for details).    Diarrhea.  Abdominal pain.  Nausea.  Patient improved after receiving Zofran here and was able to drink 8 ounces of Sprite without difficulty.  She reports improved symptoms.  Treating with prescription for Zofran and instructions given to patient to stay hydrated with clear liquids.  Instructed patient to go to the emergency department if she develops acute worsening abdominal pain.  Instructed her to follow-up with her PCP on Monday if her symptoms or not improving.  Patient agrees to plan of care.     Final Clinical Impressions(s) / UC Diagnoses   Final diagnoses:   Diarrhea, unspecified type  Generalized abdominal pain  Nausea     Discharge Instructions     Take the prescribed antinausea medicine as directed.  Keep yourself hydrated with clear liquids such as Sprite or ginger ale.    Go to the emergency department if you have acute abdominal pain.    Follow-up with your primary care provider on Monday if your symptoms are not improving.         ED Prescriptions    Medication Sig Dispense Auth. Provider   ondansetron (ZOFRAN) 4 MG tablet Take 1 tablet (4 mg total) by mouth every 6 (six) hours. 12 tablet Mickie Bail, NP     PDMP not reviewed this encounter.   Mickie Bail, NP 08/16/19 1505

## 2019-08-18 LAB — NOVEL CORONAVIRUS, NAA (HOSP ORDER, SEND-OUT TO REF LAB; TAT 18-24 HRS): SARS-CoV-2, NAA: NOT DETECTED

## 2019-11-04 ENCOUNTER — Telehealth (INDEPENDENT_AMBULATORY_CARE_PROVIDER_SITE_OTHER): Payer: Self-pay | Admitting: Family Medicine

## 2019-11-04 ENCOUNTER — Other Ambulatory Visit: Payer: Self-pay

## 2019-11-04 NOTE — Progress Notes (Signed)
[facility] Telemedicine Visit  Attempted to call patient at [phone] on two occasions for our virtual visit and sent a text message, but patient did not answer the call or click the link.   [place] Nyeisha Goodall, DO PGY-3, [facility]

## 2019-11-05 ENCOUNTER — Other Ambulatory Visit: Payer: Self-pay

## 2019-11-05 ENCOUNTER — Telehealth (INDEPENDENT_AMBULATORY_CARE_PROVIDER_SITE_OTHER): Payer: Self-pay | Admitting: Family Medicine

## 2019-11-05 NOTE — Progress Notes (Signed)
Called patient multiple times with no answer and left voicemail however patient never called back for her telemedicine visit. No charge for encounter.  Ellwood Dense, DO PGY-3, [facility] 11/05/2019 5:38 PM

## 2019-11-13 ENCOUNTER — Telehealth: Payer: Self-pay | Admitting: *Deleted

## 2019-11-13 NOTE — Telephone Encounter (Signed)
Pt is very anxious about getting the covid vaccine and would like to know what PCP recommends for her.  Jone Baseman, CMA

## 2019-11-14 NOTE — Telephone Encounter (Signed)
Called and discussed resources with patient including CDC and NEJM to help her research more information. She is also still having unintentional weight loss with fatigue and is not feeling well in general. I have scheduled her with ATC on 1/25 at 10:10 and informed Dr. Frances Furbish of appointment. 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

## 2019-11-17 ENCOUNTER — Ambulatory Visit (INDEPENDENT_AMBULATORY_CARE_PROVIDER_SITE_OTHER): Payer: Self-pay | Admitting: Family Medicine

## 2019-11-17 ENCOUNTER — Other Ambulatory Visit: Payer: Self-pay

## 2019-11-17 VITALS — BP 108/66 | HR 87 | Wt 160.6 lb

## 2019-11-17 DIAGNOSIS — F331 Major depressive disorder, recurrent, moderate: Secondary | ICD-10-CM

## 2019-11-17 DIAGNOSIS — R634 Abnormal weight loss: Secondary | ICD-10-CM

## 2019-11-17 MED ORDER — SERTRALINE HCL 50 MG PO TABS: 50.0000 mg | ORAL_TABLET | Freq: Every day | ORAL | 2 refills | Status: DC

## 2019-11-17 MED ORDER — ONDANSETRON 4 MG PO TBDP: 4.0000 mg | ORAL_TABLET | Freq: Three times a day (TID) | ORAL | 0 refills | Status: DC | PRN

## 2019-11-17 NOTE — Patient Instructions (Signed)
It was nice meeting you today Lydia Griffin!  Today, we are checking several labs to look into your loss of appetite and nausea.  I have also prescribed Zofran to help with your nausea, and we will try Zoloft once daily to help treat your depression and anxiety.  This medication reduce its full effect after about a month and a half, so continue taking it even if you do not feel its effects right away.  Please make an appointment with either myself or Dr. Talbert Forest for about a month from now to see how you are doing.  Please get your mammogram and colonoscopy as soon as insurance will allow.  Try MiraLAX to help with constipation.  We will let you know if you have any abnormal labs from today.  If you have any questions or concerns, please feel free to call the clinic.   Be well,  Dr. Frances Furbish

## 2019-11-17 NOTE — Progress Notes (Signed)
Subjective:    Lydia Griffin - 59 y.o. female MRN [id number]  Date of birth: March 31, 1961  CC:  Lydia Griffin is here for loss of appetite.  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, whether to get of Covid vaccine, and also feels significant pressure as the caretaker of her mother and the rest of her siblings She experiences nausea constantly and just does not feel much motivation to eat No difficulty swallowing Dramamine seems to help to with nausea, but it makes her sleepy Has weighed over 200 pounds in the past, but reports that she has stayed around 150 to 170 pounds over the last year Denies fever symptoms, night sweats, feeling overall pretty well Has received orders for mammogram and colonoscopy but has not yet completed these due to insurance issues Health Maintenance:  Health Maintenance Due  Topic Date Due  . MAMMOGRAM  05/10/2010  . COLONOSCOPY  05/10/2010  . TETANUS/TDAP  03/24/2015    -  reports that she has never smoked. She has never used smokeless tobacco. - Review of Systems: Per HPI. - Past Medical History: Patient Active Problem List   Diagnosis Date Noted  . Losing weight 07/05/2018  . Generalized anxiety disorder 07/05/2018  . Insomnia 12/30/2012  . Asthma, chronic 11/02/2009  . Moderate episode of recurrent major depressive disorder ([place]) 12/20/2006   - Medications: reviewed and updated   Objective:   Physical Exam BP 108/66   Pulse 87   Wt 160 lb 9.6 oz (72.8 kg)   SpO2 97%   BMI 25.92 kg/m  Gen: NAD, alert, cooperative with exam, well-appearing HEENT: NCAT, supple neck, no cervical or supraclavicular lymphadenopathy CV: RRR, good S1/S2, no murmur, no edema Resp: CTABL, no wheezes, non-labored Abd: SNTND, BS present, no guarding or organomegaly Skin: no rashes, normal turgor  Neuro: no gross deficits  Psych: good insight, alert and oriented, anxious and sad mood and affect,  frequently tearful during exam GAD 7 : Generalized Anxiety Score 11/18/2019  Nervous, Anxious, on Edge 1  Control/stop worrying 1  Worry too much - different things 2  Trouble relaxing 2  Restless 1  Easily annoyed or irritable 1  Afraid - awful might happen 0  Total GAD 7 Score 8  Anxiety Difficulty Somewhat difficult   Depression screen [facility] 2/9 11/18/2019 11/17/2019 11/17/2019  Decreased Interest 1 1 0  Down, Depressed, Hopeless 1 1 0  PHQ - 2 Score 2 2 0  Altered sleeping 2 - -  Tired, decreased energy 2 - -  Change in appetite 2 - -  Feeling bad or failure about yourself  1 - -  Trouble concentrating 1 - -  Moving slowly or fidgety/restless 0 - -  Suicidal thoughts 0 - -  PHQ-9 Score 10 - -  Difficult doing work/chores Very difficult - -         Assessment & Plan:   Moderate episode of recurrent major depressive disorder (HCC) Given patient's significant amount of stress, reported symptoms, PHQ-9 and GAD-7 scores, and tearfulness on exam, patient's anxiety and depression are the most likely causes of her weight loss due to lack of appetite.  Empathic listening was provided, and we discussed that she may benefit from trying an SSRI such as Zoloft, which tends to work well for anxiety as well as depression.  I also recommended that patient follow closely with Dr. Enid Derry or myself regarding her mood  and life stressors, and we would be happy to refer her to therapy as well.  She was agreeable to starting Zoloft 50 mg once daily.  Advised her that this medication takes several weeks to reach its full effect.  We will follow up with her in about 1 month.  Losing weight Patient has lost a significant amount of weight in the last 2 years, but she has gained 5 pounds since a few months ago and does not have any red flag symptoms currently.  However, we will check a CBC, TSH, CMP, and lipid panel today to investigate other causes.  Encouraged patient to complete her mammogram and  colonoscopy as soon as insurance allows.  We will follow-up in 1 month.    Lezlie Octave, M.D. 11/18/2019, 7:03 AM PGY-3, [facility]

## 2019-11-18 ENCOUNTER — Telehealth: Payer: Self-pay | Admitting: Family Medicine

## 2019-11-18 ENCOUNTER — Other Ambulatory Visit: Payer: Self-pay | Admitting: Family Medicine

## 2019-11-18 DIAGNOSIS — R634 Abnormal weight loss: Secondary | ICD-10-CM

## 2019-11-18 LAB — COMPREHENSIVE METABOLIC PANEL
ALT: 16 IU/L (ref 0–32)
AST: 18 IU/L (ref 0–40)
Albumin/Globulin Ratio: 1.7 (ref 1.2–2.2)
Albumin: 4.5 g/dL (ref 3.8–4.9)
Alkaline Phosphatase: 126 IU/L — ABNORMAL HIGH (ref 39–117)
BUN/Creatinine Ratio: 8 — ABNORMAL LOW (ref 9–23)
BUN: 7 mg/dL (ref 6–24)
Bilirubin Total: 0.3 mg/dL (ref 0.0–1.2)
CO2: 24 mmol/L (ref 20–29)
Calcium: 9.5 mg/dL (ref 8.7–10.2)
Chloride: 103 mmol/L (ref 96–106)
Creatinine, Ser: 0.83 mg/dL (ref 0.57–1.00)
GFR calc Af Amer: 89 mL/min/{1.73_m2} (ref >59)
GFR calc non Af Amer: 77 mL/min/{1.73_m2} (ref >59)
Globulin, Total: 2.7 g/dL (ref 1.5–4.5)
Glucose: 96 mg/dL (ref 65–99)
Potassium: 4.5 mmol/L (ref 3.5–5.2)
Sodium: 139 mmol/L (ref 134–144)
Total Protein: 7.2 g/dL (ref 6.0–8.5)

## 2019-11-18 LAB — CBC
Hematocrit: 44.5 % (ref 34.0–46.6)
Hemoglobin: 14.7 g/dL (ref 11.1–15.9)
MCH: 28.5 pg (ref 26.6–33.0)
MCHC: 33 g/dL (ref 31.5–35.7)
MCV: 86 fL (ref 79–97)
Platelets: 370 10*3/uL (ref 150–450)
RBC: 5.16 x10E6/uL (ref 3.77–5.28)
RDW: 13.3 % (ref 11.7–15.4)
WBC: 5.3 10*3/uL (ref 3.4–10.8)

## 2019-11-18 LAB — LIPID PANEL
Chol/HDL Ratio: 3.1 ratio (ref 0.0–4.4)
Cholesterol, Total: 193 mg/dL (ref 100–199)
HDL: 63 mg/dL (ref >39)
LDL Chol Calc (NIH): 119 mg/dL — ABNORMAL HIGH (ref 0–99)
Triglycerides: 58 mg/dL (ref 0–149)
VLDL Cholesterol Cal: 11 mg/dL (ref 5–40)

## 2019-11-18 LAB — TSH: TSH: 0.438 u[IU]/mL — ABNORMAL LOW (ref 0.450–4.500)

## 2019-11-18 NOTE — Progress Notes (Signed)
Free t3 and t4

## 2019-11-18 NOTE — Telephone Encounter (Signed)
Left VM detailing patient's recent lab results, which are mostly normal.  Her TSH is slightly low although improved from 1 year ago.  Unfortunately, I did not check a T3 and T4, so this should likely be rechecked when patient sees Dr. Talbert Forest in a week.  Her LDL is also slightly elevated, so she can discuss whether a statin would be indicated with Dr. Talbert Forest.  Her total and HDL cholesterol look very reassuring however.

## 2019-11-18 NOTE — Assessment & Plan Note (Signed)
Given patient's significant amount of stress, reported symptoms, PHQ-9 and GAD-7 scores, and tearfulness on exam, patient's anxiety and depression are the most likely causes of her weight loss due to lack of appetite.  Empathic listening was provided, and we discussed that she may benefit from trying an SSRI such as Zoloft, which tends to work well for anxiety as well as depression.  I also recommended that patient follow closely with Dr. Talbert Forest or myself regarding her mood and life stressors, and we would be happy to refer her to therapy as well.  She was agreeable to starting Zoloft 50 mg once daily.  Advised her that this medication takes several weeks to reach its full effect.  We will follow up with her in about 1 month.

## 2019-11-18 NOTE — Assessment & Plan Note (Addendum)
Patient has lost a significant amount of weight in the last 2 years, but she has gained 5 pounds since a few months ago and does not have any red flag symptoms currently.  However, we will check a CBC, TSH, CMP, and lipid panel today to investigate other causes.  Encouraged patient to complete her mammogram and colonoscopy as soon as insurance allows.  We will follow-up in 1 month.

## 2019-11-18 NOTE — Addendum Note (Signed)
Addended by: Anselmo Rod on: 11/18/2019 05:21 PM   Modules accepted: Orders

## 2019-11-22 LAB — T4F+T3FREE
Free T-3: 3.2 pg/mL
Free Thyroxine: 1.27 ng/dL
Thyroxine (T4): 8.6 ug/dL
Triiodothyronine (T-3), Serum: 116 ng/dL

## 2019-11-26 ENCOUNTER — Telehealth: Payer: Self-pay

## 2019-11-26 ENCOUNTER — Ambulatory Visit: Payer: Self-pay | Admitting: Family Medicine

## 2019-11-26 NOTE — Telephone Encounter (Signed)
-----   Message from [name], MD sent at 11/24/2019  8:44 AM EST ----- Would you let [name] know that her thyroid hormone levels are within the normal range?  Thank you!

## 2019-11-26 NOTE — Telephone Encounter (Signed)
Pt has been informed. Milissa Fesperman T Tyffani Foglesong, CMA  

## 2019-12-02 ENCOUNTER — Telehealth: Payer: Self-pay | Admitting: *Deleted

## 2019-12-02 NOTE — Telephone Encounter (Signed)
LVm to call office to go over screening questions prior to visit tomorrow. Lydia Griffin, CMA

## 2019-12-03 ENCOUNTER — Ambulatory Visit (INDEPENDENT_AMBULATORY_CARE_PROVIDER_SITE_OTHER): Payer: Self-pay | Admitting: Family Medicine

## 2019-12-03 ENCOUNTER — Encounter: Payer: Self-pay | Admitting: Family Medicine

## 2019-12-03 ENCOUNTER — Other Ambulatory Visit: Payer: Self-pay

## 2019-12-03 VITALS — BP 116/60 | HR 74 | Wt 166.5 lb

## 2019-12-03 DIAGNOSIS — F411 Generalized anxiety disorder: Secondary | ICD-10-CM

## 2019-12-03 DIAGNOSIS — R634 Abnormal weight loss: Secondary | ICD-10-CM
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
## 2019-12-04 ENCOUNTER — Encounter: Payer: Self-pay | Admitting: Family Medicine

## 2019-12-04 ENCOUNTER — Ambulatory Visit (INDEPENDENT_AMBULATORY_CARE_PROVIDER_SITE_OTHER): Payer: Self-pay | Admitting: Family Medicine

## 2019-12-04 ENCOUNTER — Other Ambulatory Visit: Payer: Self-pay

## 2019-12-04 DIAGNOSIS — F331 Major depressive disorder, recurrent, moderate: Secondary | ICD-10-CM

## 2019-12-04 NOTE — Patient Instructions (Signed)
Thank you for trusting me with your story.   I will call you back when I learn more about counseling resources.
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
## 2019-12-04 NOTE — Assessment & Plan Note (Addendum)
Dr. Leveda Anna did call and reach out to patient and they had a talk which has hopefully helped patient work through some things.  However she left the office before I was able to give her counseling resources.  Will have CCM consult so that social work may reach out to patient so that she can receive any help she needs in order to work through her past traumas.  No concern for SI/HI at this time.  Patient would like to avoid starting any medications and try counseling alone.  She did not start the Zoloft as previously prescribed.

## 2019-12-04 NOTE — Assessment & Plan Note (Signed)
Patient currently having an appetite with use of his CBD oil and denies any nausea.  She is currently at 166 pounds and is less worried regarding her weight now that she has started CBD oil as well as eating an orange first thing in the morning which she believes may have also increased her appetite.  Given that there are no risk associated with this do not see any harm in patient continuing this.  However did encourage her to continue to move forward with obtaining a colonoscopy and mammogram for regular cancer screening as she is due given her age.
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
## 2019-12-12 ENCOUNTER — Ambulatory Visit: Payer: Self-pay | Admitting: Licensed Clinical Social Worker

## 2019-12-12 DIAGNOSIS — F331 Major depressive disorder, recurrent, moderate: Secondary | ICD-10-CM

## 2019-12-12 NOTE — Chronic Care Management (AMB) (Signed)
   Clinical Social Work  Care Management referral   12/12/2019 Name: Lydia Griffin MRN: [id number] DOB: Jan 05, 1961  Lydia Griffin is a 59 y.o. year old female who is a primary care patient of Shirley, [place], DO . LCSW was consulted for assistance with Mental Health Counseling and Resources. LCSW reached out to Ginger Organ today by phone to introduce self, assess needs and offer Care Management services and interventions.  Patient agreed to services provided today, however does not require or desire additional follow up.  Assessment: Patient received counseling resources mailed by provider however has not connected with them due to changing her mind.  States she has had contact with all agencies on the list and does not want to use the services. Patient is unsure if she will explore other options discussed today.    SDOH (Social Determinants of Health) assessments performed: Yes:  SDOH Interventions     Most Recent Value  SDOH Interventions  SDOH Interventions for the Following Domains  Depression  Depression Interventions/Treatment   -- [provided counseling resources]    Intervention: assessed barriers to connecting to counseling, provided education on other support options such as mental health [facility] with wellness classed, support groups and  Peer support.  Other interventions include Solution-Focused Strategies.  Review of patient status, including review of consultants reports, relevant laboratory and other test results, and collaboration with appropriate care team members and the patient's provider was performed as part of comprehensive patient evaluation and provision of care management services.   Plan:   1. Patient will think about information discussed today and will decide if she would like to call [facility] [place] 2. Patient will contact office if she needs support or information 3. No further follow up required by LCSW at this time.  [name], LCSW Clinical  Social Worker [facility]   [phone] 3:53 PM

## 2019-12-20 ENCOUNTER — Emergency Department (HOSPITAL_COMMUNITY): Payer: No Typology Code available for payment source

## 2019-12-20 ENCOUNTER — Emergency Department (HOSPITAL_COMMUNITY)
Admission: EM | Admit: 2019-12-20 | Discharge: 2019-12-20 | Disposition: A | Discharge status: Home / Self Care | Payer: No Typology Code available for payment source | Attending: Emergency Medicine | Admitting: Emergency Medicine

## 2019-12-20 ENCOUNTER — Encounter (HOSPITAL_COMMUNITY): Payer: Self-pay | Admitting: Emergency Medicine

## 2019-12-20 DIAGNOSIS — R918 Other nonspecific abnormal finding of lung field: Secondary | ICD-10-CM | POA: Insufficient documentation

## 2019-12-20 DIAGNOSIS — Z79899 Other long term (current) drug therapy: Secondary | ICD-10-CM | POA: Insufficient documentation

## 2019-12-20 DIAGNOSIS — R1010 Upper abdominal pain, unspecified: Secondary | ICD-10-CM | POA: Insufficient documentation

## 2019-12-20 DIAGNOSIS — J45901 Unspecified asthma with (acute) exacerbation: Secondary | ICD-10-CM | POA: Insufficient documentation

## 2019-12-20 DIAGNOSIS — Z20822 Contact with and (suspected) exposure to covid-19: Secondary | ICD-10-CM | POA: Insufficient documentation

## 2019-12-20 DIAGNOSIS — R0782 Intercostal pain: Secondary | ICD-10-CM | POA: Diagnosis not present

## 2019-12-20 DIAGNOSIS — R519 Headache, unspecified: Secondary | ICD-10-CM | POA: Diagnosis present

## 2019-12-20 LAB — COMPREHENSIVE METABOLIC PANEL
ALT: 15 U/L (ref 0–44)
AST: 18 U/L (ref 15–41)
Albumin: 3.7 g/dL (ref 3.5–5.0)
Alkaline Phosphatase: 95 U/L (ref 38–126)
Anion gap: 10 (ref 5–15)
BUN: 10 mg/dL (ref 6–20)
CO2: 22 mmol/L (ref 22–32)
Calcium: 8.9 mg/dL (ref 8.9–10.3)
Chloride: 106 mmol/L (ref 98–111)
Creatinine, Ser: 0.89 mg/dL (ref 0.44–1.00)
GFR calc Af Amer: >60 mL/min (ref >60)
GFR calc non Af Amer: >60 mL/min (ref >60)
Glucose, Bld: 101 mg/dL — ABNORMAL HIGH (ref 70–99)
Potassium: 3.9 mmol/L (ref 3.5–5.1)
Sodium: 138 mmol/L (ref 135–145)
Total Bilirubin: 0.5 mg/dL (ref 0.3–1.2)
Total Protein: 6.8 g/dL (ref 6.5–8.1)

## 2019-12-20 LAB — SARS CORONAVIRUS 2 (TAT 6-24 HRS): SARS Coronavirus 2: NEGATIVE

## 2019-12-20 LAB — CBC
HCT: 40.1 % (ref 36.0–46.0)
Hemoglobin: 13.4 g/dL (ref 12.0–15.0)
MCH: 28.3 pg (ref 26.0–34.0)
MCHC: 33.4 g/dL (ref 30.0–36.0)
MCV: 84.6 fL (ref 80.0–100.0)
Platelets: 332 10*3/uL (ref 150–400)
RBC: 4.74 MIL/uL (ref 3.87–5.11)
RDW: 14.7 % (ref 11.5–15.5)
WBC: 5.2 10*3/uL (ref 4.0–10.5)
nRBC: 0 % (ref 0.0–0.2)

## 2019-12-20 MED ORDER — AZITHROMYCIN 250 MG PO TABS: 250.0000 mg | ORAL_TABLET | Freq: Every day | ORAL | 0 refills | Status: DC

## 2019-12-20 MED ORDER — FENTANYL CITRATE (PF) 100 MCG/2ML IJ SOLN
50.0000 ug | Freq: Once | INTRAMUSCULAR | Status: AC
  Administered 2019-12-20: 13:00:00 50 ug via INTRAVENOUS
  Filled 2019-12-20: qty 2

## 2019-12-20 MED ORDER — AEROCHAMBER PLUS FLO-VU LARGE MISC
1.0000 | Freq: Once | Status: AC
  Administered 2019-12-20: 16:00:00 1

## 2019-12-20 MED ORDER — PREDNISONE 10 MG (21) PO TBPK: ORAL_TABLET | Freq: Every day | ORAL | 0 refills | Status: DC

## 2019-12-20 MED ORDER — ALBUTEROL SULFATE HFA 108 (90 BASE) MCG/ACT IN AERS
2.0000 | INHALATION_SPRAY | Freq: Once | RESPIRATORY_TRACT | Status: AC
  Administered 2019-12-20: 16:00:00 2 via RESPIRATORY_TRACT
  Filled 2019-12-20: qty 6.7

## 2019-12-20 MED ORDER — ONDANSETRON HCL 4 MG/2ML IJ SOLN
4.0000 mg | Freq: Once | INTRAMUSCULAR | Status: AC
  Administered 2019-12-20: 4 mg via INTRAVENOUS
  Filled 2019-12-20: qty 2

## 2019-12-20 MED ORDER — IOHEXOL 300 MG/ML  SOLN
100.0000 mL | Freq: Once | INTRAMUSCULAR | Status: AC | PRN
  Administered 2019-12-20: 15:00:00 100 mL via INTRAVENOUS

## 2019-12-20 MED ORDER — METHOCARBAMOL 500 MG PO TABS: 500.0000 mg | ORAL_TABLET | Freq: Three times a day (TID) | ORAL | 0 refills | Status: DC | PRN

## 2019-12-20 MED ORDER — AEROCHAMBER PLUS FLO-VU LARGE MISC
Status: AC
  Filled 2019-12-20: qty 1
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
## 2019-12-20 NOTE — ED Notes (Signed)
Patient verbalizes understanding of discharge instructions. Opportunity for questioning and answers were provided. Pt discharged from ED. 

## 2019-12-20 NOTE — Discharge Instructions (Addendum)
Please read and follow all provided instructions.  Tests performed today include: CT imaging of your head/neck/chest/abdomen: No significant acute injury.  It did show some pulmonary nodules and thickening of your bronchioles, this can occur with a viral infection, please discuss possible repeat imaging with your primary care provider after antibiotic and steroid treatment.  We have also tested you for COVID-19 and will call you if results are positive.  If positive you will need to quarantine and follow attached guidelines. Lab work: No significant abnormality.   Medications prescribed:    -Prednisone taper: This is a steroid to treat your asthma exacerbation, please take as prescribed -Azithromycin: This is an antibiotic to treat for an atypical pneumonia, please take this as prescribed. - Robaxin is the muscle relaxer I have prescribed, this is meant to help with muscle tightness. Be aware that this medication may make you drowsy therefore the first time you take this it should be at a time you are in an environment where you can rest. Do not drive or operate heavy machinery when taking this medication. Do not drink alcohol or take other sedating medications with this medicine such as narcotics or benzodiazepines.   Use inhaler 1 to 2 puffs every 4-6 hours continuously for the next 24 to 48 hours then as needed.  You make take Tylenol per over the counter dosing with these medications.   We have prescribed you new medication(s) today. Discuss the medications prescribed today with your pharmacist as they can have adverse effects and interactions with your other medicines including over the counter and prescribed medications. Seek medical evaluation if you start to experience new or abnormal symptoms after taking one of these medicines, seek care immediately if you start to experience difficulty breathing, feeling of your throat closing, facial swelling, or rash as these could be indications of a  more serious allergic reaction   Follow-up instructions: Please follow-up with your primary care provider in 1 week for further re-evaluation.   Return instructions:  Please return to the Emergency Department if you experience worsening symptoms.  You have numbness, tingling, or weakness in the arms or legs.  You develop severe headaches not relieved with medicine.  You have severe neck pain, especially tenderness in the middle of the back of your neck.  You have vision or hearing changes If you develop confusion You have changes in bowel or bladder control.  There is increasing pain in any area of the body.  You have shortness of breath, lightheadedness, dizziness, or fainting.  You have chest pain.  You feel sick to your stomach (nauseous), or throw up (vomit).  You have increasing abdominal discomfort.  There is blood in your urine, stool, or vomit.  You have pain in your shoulder (shoulder strap areas).  You feel your symptoms are getting worse or if you have any other emergent concerns  Additional Information:  Your vital signs today were: Vitals:   12/20/19 1234 12/20/19 1235  BP: 133/77   Pulse:  69  Resp:    Temp:    SpO2:  98%     If your blood pressure (BP) was elevated above 135/85 this visit, please have this repeated by your doctor within one month -----------------------------------------------------

## 2019-12-22 ENCOUNTER — Other Ambulatory Visit: Payer: Self-pay

## 2019-12-22 ENCOUNTER — Telehealth (INDEPENDENT_AMBULATORY_CARE_PROVIDER_SITE_OTHER): Payer: Self-pay | Admitting: Family Medicine

## 2019-12-22 ENCOUNTER — Telehealth: Payer: Self-pay

## 2019-12-22 DIAGNOSIS — S46819A Strain of other muscles, fascia and tendons at shoulder and upper arm level, unspecified arm, initial encounter: Secondary | ICD-10-CM

## 2019-12-22 DIAGNOSIS — S46812A Strain of other muscles, fascia and tendons at shoulder and upper arm level, left arm, initial encounter: Secondary | ICD-10-CM

## 2019-12-22 DIAGNOSIS — J4550 Severe persistent asthma, uncomplicated: Secondary | ICD-10-CM

## 2019-12-22 HISTORY — DX: Strain of other muscles, fascia and tendons at shoulder and upper arm level, unspecified arm, initial encounter: S46.819A
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
## 2019-12-22 NOTE — Assessment & Plan Note (Signed)
Symptoms seem consistent with trapezius muscle strain given the distribution.  I gave rehab exercises for the patient to do.  I think given the degree of stiffness and pain it is okay for her to miss work for a few more days to get better.  She can take naproxen 500 mg twice daily.  Muscle relaxer as needed if it helps her.
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
## 2019-12-22 NOTE — Assessment & Plan Note (Signed)
Apparently had an asthma exacerbation secondary to inhaled dust particles from.  Doing well from this standpoint although still feels a little tight per report.  Still taking azithromycin and the prednisone taper.  Can follow-up as needed for shortness of breath.  Reviewed alarm symptoms and when to follow-up.

## 2020-01-02 ENCOUNTER — Other Ambulatory Visit: Payer: Self-pay

## 2020-01-02 ENCOUNTER — Telehealth (INDEPENDENT_AMBULATORY_CARE_PROVIDER_SITE_OTHER): Payer: Self-pay | Admitting: Family Medicine

## 2020-01-02 DIAGNOSIS — J45909 Unspecified asthma, uncomplicated: Secondary | ICD-10-CM

## 2020-01-02 DIAGNOSIS — T17908A Unspecified foreign body in respiratory tract, part unspecified causing other injury, initial encounter: Secondary | ICD-10-CM

## 2020-01-02 HISTORY — DX: Unspecified foreign body in respiratory tract, part unspecified causing other injury, initial encounter: T17.908A
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
## 2020-01-02 NOTE — Assessment & Plan Note (Signed)
Asthma symptoms appear stable, but still has wheezing -Continue prednisone course, Dulera, duo nebs as needed -We will write out for 1 week additionally off from work -Referral to pulmonology for ongoing shortness of breath asthma control

## 2020-01-08 ENCOUNTER — Ambulatory Visit (INDEPENDENT_AMBULATORY_CARE_PROVIDER_SITE_OTHER): Payer: Self-pay | Admitting: Family Medicine

## 2020-01-08 ENCOUNTER — Other Ambulatory Visit: Payer: Self-pay

## 2020-01-08 ENCOUNTER — Encounter: Payer: Self-pay | Admitting: Family Medicine

## 2020-01-08 VITALS — BP 124/64 | HR 107 | Wt 157.0 lb

## 2020-01-08 DIAGNOSIS — J45901 Unspecified asthma with (acute) exacerbation: Secondary | ICD-10-CM

## 2020-01-08 MED ORDER — PREDNISONE 20 MG PO TABS: 40.0000 mg | ORAL_TABLET | Freq: Every day | ORAL | 0 refills | Status: AC

## 2020-01-08 NOTE — Patient Instructions (Addendum)
It was great to see you!  Our plans for today:  - We are keeping you out of work for another week. - Take the prednisone for another 5 day course. - Use duonebs every 4-6 hours while you are awake. Use albuterol as needed in between. - Keep using the flutter valve. - Come back in one week for follow up.  Take care and seek immediate care sooner if you develop any concerns.   Dr. [facility]
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
## 2020-01-12 ENCOUNTER — Ambulatory Visit (INDEPENDENT_AMBULATORY_CARE_PROVIDER_SITE_OTHER): Payer: Self-pay | Admitting: Family Medicine

## 2020-01-12 ENCOUNTER — Other Ambulatory Visit: Payer: Self-pay

## 2020-01-12 ENCOUNTER — Encounter: Payer: Self-pay | Admitting: Family Medicine

## 2020-01-12 DIAGNOSIS — J45901 Unspecified asthma with (acute) exacerbation: Secondary | ICD-10-CM

## 2020-01-12 MED ORDER — TRELEGY ELLIPTA 100-62.5-25 MCG/INH IN AEPB: 1.0000 "application " | INHALATION_SPRAY | Freq: Every day | RESPIRATORY_TRACT | 0 refills | Status: DC

## 2020-01-12 MED ORDER — IPRATROPIUM-ALBUTEROL 0.5-2.5 (3) MG/3ML IN SOLN: 3.0000 mL | RESPIRATORY_TRACT | 1 refills | Status: DC | PRN

## 2020-01-12 NOTE — Patient Instructions (Addendum)
Thank you for coming to see me today. It was a pleasure! Today we talked about:   We have started a new inhaler called Trelegy.  He does have another medication in it that may help better control your asthma.  Please discontinue the [facility].  Use the Trelegy once daily.  In the meantime please use your duo nebs every 4 hours for wheezing.  You may use albuterol alone between those treatments.  Please continue your current steroids.  If you are not seeing significant improvement please do not hesitate to come to our office the end of this week.  If you are feeling much better than there is no need to make an appointment.  For your rib pain, I recommend that you use a cream called Voltaren gel.  You can pick this up at any pharmacy or [employer].  Please follow-up as needed.  If you have any questions or concerns, please do not hesitate to call the office at [phone].  Take Care,   [place] Reino Lybbert, DO
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
## 2020-01-14 ENCOUNTER — Encounter: Payer: Self-pay | Admitting: Family Medicine
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
## 2020-01-15 ENCOUNTER — Telehealth: Payer: Self-pay | Admitting: Family Medicine

## 2020-01-15 NOTE — Telephone Encounter (Signed)
Spoke with pt. She stated that she would do a virtual visit with Dr. Talbert Forest on 3/29. She also said that she would come into the office on 3/29 to sign ROI form. I told pt I would leave the ROI at the front desk. Pt understood.  Aquilla Solian, CMA
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
## 2020-01-19 ENCOUNTER — Telehealth (INDEPENDENT_AMBULATORY_CARE_PROVIDER_SITE_OTHER): Payer: Self-pay | Admitting: Family Medicine

## 2020-01-19 ENCOUNTER — Other Ambulatory Visit: Payer: Self-pay

## 2020-01-19 DIAGNOSIS — Z5329 Procedure and treatment not carried out because of patient's decision for other reasons: Secondary | ICD-10-CM

## 2020-01-19 NOTE — Progress Notes (Signed)
[facility] Telemedicine Visit  Attempted to call patient at [phone] [phone] with no answer and voicemail was full.   [place] Samule Life, DO PGY-3, [facility]

## 2020-01-20 ENCOUNTER — Telehealth: Payer: Self-pay | Admitting: Family Medicine

## 2020-01-20 NOTE — Telephone Encounter (Signed)
[employer] form dropped off for at front desk for completion. Patient would like PCP to give her a call, and for her PCP to provide her last 3 visits on the insurance claim.Verified that patient section of form has been completed.  Last DOS/WCC with PCP was 01-12-2020 .  Placed form in [place] team folder to be completed by clinical staff.  Lydia Griffin

## 2020-01-21 NOTE — Telephone Encounter (Signed)
Spoke with pt. Informed her that a different ROI form would need to filled out. She said that was fine and that she would come after 1:30 to fill it out. Aquilla Solian, CMA

## 2020-01-21 NOTE — Telephone Encounter (Signed)
Please put ROI in blue team folder. Aquilla Solian, CMA

## 2020-01-21 NOTE — Telephone Encounter (Signed)
Attemtped to reach Ms. Deoliveira. To inform her that she would have to fill out a different ROI form. Due to her wanting 3 additonal office visit sent. There was no answer. And her mailbox is full. Aquilla Solian, CMA

## 2020-01-21 NOTE — Telephone Encounter (Signed)
I let her know that the form would be at the front desk. Aquilla Solian, CMA

## 2020-01-21 NOTE — Telephone Encounter (Signed)
Forms where given to [facility] to be faxed off. Aquilla Solian, CMA

## 2020-01-26 ENCOUNTER — Telehealth: Payer: Self-pay

## 2020-01-26 NOTE — Telephone Encounter (Signed)
Patient calls nurse line requesting Trelegy samples. Patient stated she was given 1 sample at most recent office visit. Patient stated the inhaler is "700 dollars* at her pharmacy. Will forward to [place] to see if appropriate to give her another sample.

## 2020-01-26 NOTE — Telephone Encounter (Signed)
Per Raymondo Band, 2 Trelegy samples were left up front for the patient. The patient has been contacted and informed.

## 2020-01-27 NOTE — Telephone Encounter (Signed)
Samples of Trelegy were given to the patient, quantity 2, Lot Number [id number]. Expiration Date: 04/2021 NDC: 9741-6384-53.   [name], RN

## 2020-02-23 ENCOUNTER — Ambulatory Visit (INDEPENDENT_AMBULATORY_CARE_PROVIDER_SITE_OTHER): Payer: Self-pay | Admitting: Family Medicine

## 2020-02-23 ENCOUNTER — Encounter (HOSPITAL_COMMUNITY): Payer: Self-pay | Admitting: Emergency Medicine

## 2020-02-23 ENCOUNTER — Telehealth: Payer: Self-pay

## 2020-02-23 ENCOUNTER — Emergency Department (HOSPITAL_COMMUNITY)
Admission: EM | Admit: 2020-02-23 | Discharge: 2020-02-23 | Disposition: A | Discharge status: Home / Self Care | Payer: No Typology Code available for payment source | Attending: Emergency Medicine | Admitting: Emergency Medicine

## 2020-02-23 ENCOUNTER — Emergency Department (HOSPITAL_COMMUNITY): Payer: No Typology Code available for payment source

## 2020-02-23 ENCOUNTER — Other Ambulatory Visit: Payer: Self-pay

## 2020-02-23 ENCOUNTER — Encounter: Payer: Self-pay | Admitting: Family Medicine

## 2020-02-23 DIAGNOSIS — Z20822 Contact with and (suspected) exposure to covid-19: Secondary | ICD-10-CM | POA: Insufficient documentation

## 2020-02-23 DIAGNOSIS — Z79899 Other long term (current) drug therapy: Secondary | ICD-10-CM | POA: Insufficient documentation

## 2020-02-23 DIAGNOSIS — J45901 Unspecified asthma with (acute) exacerbation: Secondary | ICD-10-CM | POA: Insufficient documentation

## 2020-02-23 DIAGNOSIS — R0602 Shortness of breath: Secondary | ICD-10-CM | POA: Diagnosis present

## 2020-02-23 LAB — POC SARS CORONAVIRUS 2 AG -  ED: SARS Coronavirus 2 Ag: NEGATIVE

## 2020-02-23 MED ORDER — PREDNISONE 10 MG PO TABS: 40.0000 mg | ORAL_TABLET | Freq: Every day | ORAL | 0 refills | Status: AC

## 2020-02-23 MED ORDER — ALBUTEROL SULFATE (2.5 MG/3ML) 0.083% IN NEBU
5.0000 mg | INHALATION_SOLUTION | Freq: Once | RESPIRATORY_TRACT | Status: DC
  Filled 2020-02-23: qty 6

## 2020-02-23 MED ORDER — ALBUTEROL SULFATE HFA 108 (90 BASE) MCG/ACT IN AERS
6.0000 | INHALATION_SPRAY | Freq: Once | RESPIRATORY_TRACT | Status: AC
  Administered 2020-02-23: 6 via RESPIRATORY_TRACT
  Filled 2020-02-23: qty 6.7

## 2020-02-23 MED ORDER — METHYLPREDNISOLONE SODIUM SUCC 125 MG IJ SOLR
125.0000 mg | Freq: Once | INTRAMUSCULAR | Status: AC
  Administered 2020-02-23: 125 mg via INTRAMUSCULAR
  Filled 2020-02-23: qty 2

## 2020-02-23 NOTE — Patient Instructions (Signed)
Sorry you are not feeling well. Due to your asthma and shortness of breath we are sending you to the emergency department.

## 2020-02-23 NOTE — Assessment & Plan Note (Signed)
Assessment: Asthma exacerbation.  Patient denies infectious symptoms including fevers, sore throat.  Patient was previously at the emergency department earlier today but left due to what she felt was a delay in treatment.  Plan: -As we are unable to do duo nebs in the clinic due to COVID-19 pandemic recommended patient return to the emergency department for further work-up -Called charge nurse in the ED to let her know that the patient would be, -Patient stated that she felt well enough to go without EMS assistance and that she would go straight to the emergency department

## 2020-02-23 NOTE — Telephone Encounter (Signed)
Patient calls nurse line back. Patient crying and upset that she can not get help in the ED. Patient scheduled for this morning at our clinic.
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
## 2020-02-23 NOTE — Telephone Encounter (Signed)
Patient LVM on nurse line stating she is SOB and outside of family practice. I called patient back. Patient reports she has been SOB since last night. She worked all night (at [employer]) and got off at 7am this morning. Patient went to the ED after her shift and was given a treatment for asthma and was told to go back and wait in the lobby. Patient was very upset about this and stated she was going to leave [facility] and go to [place]. Patient advised to go to a ED immediately if she felt she was still SOB. Patient stated, "I am just going to go home,"" and hung up.

## 2020-02-23 NOTE — Discharge Instructions (Signed)
Thank you for allowing me to care for you today. Please return to the emergency department if you have new or worsening symptoms. Take your medications as instructed.  ° °

## 2020-02-23 NOTE — ED Notes (Signed)
Pt stated she is leaving and is going to her doctors office. Informed pt that pt are about to go upstairs and rooms are about to available. Pt said she is going to go ahead and go
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
## 2020-02-23 NOTE — ED Triage Notes (Signed)
Pt here after working at [employer] all night using her inhaler , no smoking , able to talk in complete sentences

## 2020-03-25 ENCOUNTER — Encounter: Payer: Self-pay | Admitting: Family Medicine

## 2020-03-25 ENCOUNTER — Telehealth (INDEPENDENT_AMBULATORY_CARE_PROVIDER_SITE_OTHER): Payer: Self-pay | Admitting: Family Medicine

## 2020-03-25 ENCOUNTER — Other Ambulatory Visit: Payer: Self-pay

## 2020-03-25 DIAGNOSIS — J438 Other emphysema: Secondary | ICD-10-CM

## 2020-03-25 DIAGNOSIS — J439 Emphysema, unspecified: Secondary | ICD-10-CM

## 2020-03-25 DIAGNOSIS — F331 Major depressive disorder, recurrent, moderate: Secondary | ICD-10-CM

## 2020-03-25 DIAGNOSIS — J4551 Severe persistent asthma with (acute) exacerbation: Secondary | ICD-10-CM

## 2020-03-25 DIAGNOSIS — J4550 Severe persistent asthma, uncomplicated: Secondary | ICD-10-CM

## 2020-03-25 HISTORY — DX: Emphysema, unspecified: J43.9

## 2020-03-25 MED ORDER — PREDNISONE 20 MG PO TABS: 40.0000 mg | ORAL_TABLET | Freq: Every day | ORAL | 1 refills | Status: AC

## 2020-03-26 NOTE — Assessment & Plan Note (Addendum)
Established problem worsened.  Rx: Prednisone 40 mg daily x 5 days        Continue use of Abluterol via neb QID        Continue Dulera 2 puffs BID        Advised to go to ED if breathing worsens.  Will see if there is a Trelegy MDI available for patient.           Social Determinant of Health: Uninsured. Will request CCM to assist patient with process of applying for enrollment in the [facility] and assistance with complex case management in an uninsured patient.   Ms Lydia Griffin has worked with CCM - LCSW back in February in management of her mood disorder and acquistion of counseling.  Ms Lydia Griffin is interested in working again with CCM in management of her chronic disease and accessing healthcare.

## 2020-03-26 NOTE — Progress Notes (Signed)
[facility] Telemedicine / E- Visit   This visit type was conducted due to national recommendations for restrictions regarding the COVID-19 Pandemic (e.g. social distancing) in an effort to limit this patient's exposure and mitigate transmission in our community.   Due to their co-morbid illnesses, this patient is at least at moderate risk for complications without adequate follow up: yes   This format is felt to be most appropriate for this patient at this time.  All issues noted in this document were discussed and addressed.  A limited physical exam was performed with this format.   Patient's identity was confirmed using her Full Name and DOB.  We discussed the limitations of evaluation and management by telemedicine and the availability of in person appointments.  We discussed the limitations in assessment, security and privacy with performing an evaluation and management service by telephone and the availability of in person appointments.  We discussed with the patient that there may be a patient responsible charge related to this service.  The patient expressed understanding and agreed to proceed.   Encounter participants: Patient: Lydia Griffin  Patient Location: Home Provider: Tawanna Cooler Jahmel Flannagan at office  Others (if applicable): none  Chief Complaint: Wheezing  HPI:  Asthma Exacerbation: She has previously been evaluated here for asthma and now presents with an asthma exacerbation. This exacerbation began 2 days ago.  Associated symptoms include dyspnea, nonproductive cough and wheezing.  Suspected precipitants include pollens and weather change.  Symptoms have been gradually worsening since their onset.  This is the first evaluation that has occurred during this exacerbation. The previous exacerbation occurred 3 weeks ago. She has treated this current exacerbation with short-acting inhaled beta-adrenergic agonists.She has Dulera which she has been taking though  she felt the Trelegy she had gotten from our clinic 3 weeks ago (and run out) helped the best.  ROS: No fever           No N/V           No CoVid exposure. No exposure to sick idividuals.  Has been staying home.    Pertinent PMHx: Severe asthma with hisotry of intub x 2.  Exam:  Respiratory: speaking in full sentence, no audible wheeze  Assessment/Plan:  No problem-specific Assessment & Plan notes found for this encounter.    Follow Up Instructions:     We discussed the assessment and treatment plan with the patient. The patient was provided an opportunity to ask questions and all were answered. The patient agreed with the plan and demonstrated an understanding of the instructions.   The patient was advised to call back or seek an in-person evaluation if the symptoms worsen or if the condition fails to improve as anticipated.  COVID-19 Education: The signs and symptoms of COVID-19 were discussed with the patient and how to seek care for testing (follow up with PCP or arrange E-visit).  The importance of social distancing was discussed today.  Time spent on phone with patient: 30 minutes

## 2020-03-29 ENCOUNTER — Telehealth: Payer: Self-pay | Admitting: Family Medicine

## 2020-03-29 NOTE — Chronic Care Management (AMB) (Signed)
Care Management   Outreach Note  03/29/2020 Name: Lydia Griffin MRN: [id number] DOB: 1961-03-17  Referred by: Shirley, [place], DO Reason for referral : Care Management (CM Initial outreach unsuccessful)   An unsuccessful telephone outreach was attempted today. The patient was referred to the case management team for assistance with care management and care coordination.   Follow Up Plan: A HIPPA compliant phone message was left for the patient providing contact information and requesting a return call.  The care management team will reach out to the patient again over the next 7 days.  If patient returns call to provider office, please advise to call Embedded Care Management Care Guide Elisha Ponder LPN at [phone]  Elisha Ponder, LPN Health Advisor, Embedded Care Coordination [facility] Care Management ??[email or web address] ??[phone]

## 2020-03-30 NOTE — Chronic Care Management (AMB) (Signed)
Care Management   Note  03/30/2020 Name: Lydia Griffin MRN: [id number] DOB: 09/14/1961  Lydia Griffin is a 59 y.o. year old female who is a primary care patient of Shirley, [place], DO. I reached out to Ginger Organ by phone today in response to a referral sent by Ms. [name] [name]'s health plan.    Ms. [name] was given information about care management services today including:  1. Care management services include personalized support from designated clinical staff supervised by her physician, including individualized plan of care and coordination with other care providers 2. 24/7 contact phone numbers for assistance for urgent and routine care needs. 3. The patient may stop care management services at any time by phone call to the office staff.  Patient agreed to services and verbal consent obtained.   Follow up plan: Telephone appointment with care management team member scheduled for:04/06/2020  Elisha Ponder, LPN Health Advisor, Embedded Care Coordination [facility] Health Care Management ??[email or web address] ??[phone]

## 2020-04-06 ENCOUNTER — Ambulatory Visit: Payer: Self-pay

## 2020-04-06 ENCOUNTER — Other Ambulatory Visit: Payer: Self-pay

## 2020-04-06 NOTE — Patient Instructions (Signed)
Visit Information  Goals Addressed              This Visit's Progress   .  I need Help (pt-stated)        CARE PLAN ENTRY (see longitudinal plan of care for additional care plan information)  Current Barriers:  . Care Coordination needs related to need for insurance  in a patient with Chronic Asthma (disease states) . Chronic Disease Management support and education needs related to Chronic Asthma  Nurse Case Manager Clinical Goal(s):  [name] Kitchen Over the next 30 days, patient will work with RN Case Production designer, theatre/television/film to address needs related to no insurance  Interventions:  . Inter-disciplinary care team collaboration (see longitudinal plan of care) . Evaluation of current treatment plan related to asthma treatment and patient's adherence to plan as established by provider. [name] Kitchen Collaborated with Lydia Griffin  regarding [employer] . Discussed plans with patient for ongoing care management follow up and provided patient with direct contact information for care management team . Collaborated with LCSW Lydia Griffin about other options for the pationt regarding insurance . Advised patient to continue to work with [name] for medication assistance to receive her inhalers . Discussed her chronic condition and preventative measures  Patient Self Care Activities:  . Patient verbalizes understanding of plan  . Self administers medications as prescribed . Attends all scheduled provider appointments . Performs ADL's independently . Performs IADL's independently . Calls provider office for new concerns or questions . Unable to independently self navigate system for insurance  Initial goal documentation        Lydia Griffin was given information about Care Management services today including:  1. Care Management services include personalized support from designated clinical staff supervised by her physician, including individualized plan of care and coordination with other care providers 2. 24/7 contact  phone numbers for assistance for urgent and routine care needs. 3. The patient may stop CCM services at any time (effective at the end of the month) by phone call to the office staff.  Patient agreed to services and verbal consent obtained.   The patient verbalized understanding of instructions provided today and declined a print copy of patient instruction materials.   The care management team will reach out to the patient again over the next 10 days.  The patient has been provided with contact information for the care management team and has been advised to call with any health related questions or concerns.   Lydia Fairly RN, BSN, [facility] Care Management Coordinator [facility] Phone: [phone] Fax: [phone]

## 2020-04-06 NOTE — Chronic Care Management (AMB) (Signed)
Care Management   Initial Visit Note  04/06/2020 Name: Lydia Griffin MRN: [id number] DOB: 30-Jun-1961   Assessment: Lydia Griffin is a 59 y.o. year old female who sees [place], [place], DO for primary care. The care management team was consulted for assistance with care management and care coordination needs related to Disease Management Educational Needs Care Coordination Asthma and need for Insurance.   Review of patient status, including review of consultants reports, relevant laboratory and other test results, and collaboration with appropriate care team members and the patient's provider was performed as part of comprehensive patient evaluation and provision of care management services.    SDOH (Social Determinants of Health) assessments performed: No See Care Plan activities for detailed interventions related to [facility]     Outpatient Encounter Medications as of 04/06/2020  Medication Sig  . albuterol (PROAIR HFA) 108 (90 Base) MCG/ACT inhaler Inhale 2 puffs into the lungs every 6 (six) hours as needed for wheezing or shortness of breath.  [name] Kitchen albuterol (PROVENTIL) (2.5 MG/3ML) 0.083% nebulizer solution Take 3 mLs (2.5 mg total) by nebulization every 4 (four) hours as needed for wheezing or shortness of breath.  [name] Kitchen azithromycin (ZITHROMAX) 250 MG tablet Take 1 tablet (250 mg total) by mouth daily. Take first 2 tablets together, then 1 every day until finished.  . Biotin 1 MG CAPS Take 1 mg by mouth daily.  . Cyanocobalamin (VITAMIN B 12 PO) Take 1,000 mcg by mouth daily.  . fluticasone (FLONASE) 50 MCG/ACT nasal spray Place 2 sprays into both nostrils daily.  . Fluticasone-Umeclidin-Vilant (TRELEGY ELLIPTA) 100-62.5-25 MCG/INH AEPB Inhale 1 application into the lungs daily.  [name] Kitchen ipratropium-albuterol (DUONEB) 0.5-2.5 (3) MG/3ML SOLN Take 3 mLs by nebulization every 4 (four) hours as needed.  . lidocaine (LIDODERM) 5 % Place 1 patch onto the skin daily. Remove & Discard patch within 12 hours or  as directed by MD  . loratadine (CLARITIN) 10 MG tablet Take 1 tablet (10 mg total) by mouth daily.  . methocarbamol (ROBAXIN) 500 MG tablet Take 1 tablet (500 mg total) by mouth every 8 (eight) hours as needed for muscle spasms.  . montelukast (SINGULAIR) 10 MG tablet Take 1 tablet (10 mg total) by mouth at bedtime.  . Multiple Vitamins-Minerals (MULTIVITAMIN WITH MINERALS) tablet Take 1 tablet by mouth daily.  . ondansetron (ZOFRAN) 4 MG tablet Take 1 tablet (4 mg total) by mouth every 6 (six) hours.  . ondansetron (ZOFRAN-ODT) 4 MG disintegrating tablet Take 1 tablet (4 mg total) by mouth every 8 (eight) hours as needed for nausea or vomiting.  . sertraline (ZOLOFT) 50 MG tablet Take 1 tablet (50 mg total) by mouth daily.  . Tiotropium Bromide Monohydrate 1.25 MCG/ACT AERS Inhale 2.5 mcg into the lungs daily.   No facility-administered encounter medications on file as of 04/06/2020.    Goals Addressed              This Visit's Progress   .  I need Help (pt-stated)        CARE PLAN ENTRY (see longitudinal plan of care for additional care plan information)  Current Barriers:  . Care Coordination needs related to need for insurance  in a patient with Chronic Asthma (disease states) . Chronic Disease Management support and education needs related to Chronic Asthma  Nurse Case Manager Clinical Goal(s):  [name] Kitchen Over the next 30 days, patient will work with RN Case Production designer, theatre/television/film to address needs related to no insurance  Interventions:  . Inter-disciplinary care  team collaboration (see longitudinal plan of care) . Evaluation of current treatment plan related to asthma treatment and patient's adherence to plan as established by provider. [name] Kitchen Collaborated with Penelope Galas  regarding [employer] . Discussed plans with patient for ongoing care management follow up and provided patient with direct contact information for care management team . Collaborated with LCSW Casimer Lanius about other  options for the pationt regarding insurance . Advised patient to continue to work with [name] for medication assistance to receive her inhalers . Discussed her chronic condition and preventative measures  Patient Self Care Activities:  . Patient verbalizes understanding of plan  . Self administers medications as prescribed . Attends all scheduled provider appointments . Performs ADL's independently . Performs IADL's independently . Calls provider office for new concerns or questions . Unable to independently self navigate system for insurance  Initial goal documentation         Follow up plan:  The care management team will reach out to the patient again over the next 10 days.  The patient has been provided with contact information for the care management team and has been advised to call with any health related questions or concerns.   Lydia Griffin was given information about Care Management services today including:  1. Care Management services include personalized support from designated clinical staff supervised by a physician, including individualized plan of care and coordination with other care providers 2. 24/7 contact phone numbers for assistance for urgent and routine care needs. 3. The patient may stop Care Management services at any time (effective at the end of the month) by phone call to the office staff.  Patient agreed to services and verbal consent obtained.  Lazaro Arms RN, BSN, [facility] Care Management Coordinator [place] Phone: [phone] Fax: [phone]

## 2020-04-09 ENCOUNTER — Ambulatory Visit: Payer: Self-pay | Admitting: Licensed Clinical Social Worker

## 2020-04-09 NOTE — Chronic Care Management (AMB) (Signed)
  Social Work Care Management  Unsuccessful Phone Outreach   04/09/2020 Name: Lydia Griffin MRN: [id number] DOB: 10/29/1960  Referred by:  CCM RN Reason for referral : Care Coordination (counseling & resources)   Lydia Griffin is a 59 y.o. year old female who sees [place], [place], DO for primary care.  LCSW received referral to assist patient with connecting to counseling resources and explore insurance options as patient does not have insurance.  LCSW called patient to assess needs and barriers reference the above referral. Telephone outreach was unsuccessful. A HIPPA compliant phone message was left for the patient providing contact information and requesting a return call.  Plan:  If no return call is received. LCSW will call again in 5 to 7 days.  Review of patient status, including review of consultants reports, relevant laboratory and other test results, and collaboration with CCM RN team members and the patient's provider was performed as part of comprehensive patient evaluation and provision of care management services.   [name], LCSW Care Management & Coordination  [facility] / [facility]   [phone] 11:37 AM

## 2020-04-12 ENCOUNTER — Ambulatory Visit: Payer: Self-pay

## 2020-04-12 ENCOUNTER — Other Ambulatory Visit: Payer: Self-pay

## 2020-04-12 NOTE — Patient Instructions (Signed)
Visit Information  Goals Addressed              This Visit's Progress   .  I need Help (pt-stated)        CARE PLAN ENTRY (see longitudinal plan of care for additional care plan information)  Current Barriers:  . Care Coordination needs related to need for insurance  in a patient with Chronic Asthma (disease states) . Chronic Disease Management support and education needs related to Chronic Asthma  Nurse Case Manager Clinical Goal(s):  [name] Kitchen Over the next 30 days, patient will work with RN Case Production designer, theatre/television/film to address needs related to no insurance  Interventions:  . Inter-disciplinary care team collaboration (see longitudinal plan of care) . Evaluation of current treatment plan related to asthma treatment and patient's adherence to plan as established by provider. [name] Kitchen Collaborated with Dereck Ligas  regarding [employer] . Discussed plans with patient for ongoing care management follow up and provided patient with direct contact information for care management team . Collaborated with LCSW [name] about other options for the pationt regarding insurance . Advised patient to continue to work with [name] for medication assistance to receive her inhalers . Discussed her chronic condition and preventative measures . 04/12/20 . Spoke with the patient and talked with the patient about two options available. . One option is the orange card.  Put the information in an envelope up at the front for the front for the patient to pick up and the other option is for to apply for Obama care. . The patient states that she will try to get by the office soon to get the information.  Patient Self Care Activities:  . Patient verbalizes understanding of plan  . Self administers medications as prescribed . Attends all scheduled provider appointments . Performs ADL's independently . Performs IADL's independently . Calls provider office for new concerns or questions . Unable to independently self  navigate system for insurance  Please see past updates related to this goal by clicking on the "Past Updates" button in the selected goal         Ms. Pitz was given information about Care Management services today including:  1. Care Management services include personalized support from designated clinical staff supervised by her physician, including individualized plan of care and coordination with other care providers 2. 24/7 contact phone numbers for assistance for urgent and routine care needs. 3. The patient may stop CCM services at any time (effective at the end of the month) by phone call to the office staff.  Patient agreed to services and verbal consent obtained.   The patient verbalized understanding of instructions provided today and declined a print copy of patient instruction materials.   The care management team will reach out to the patient again over the next 21 days.  The patient has been provided with contact information for the care management team and has been advised to call with any health related questions or concerns.   Juanell Fairly RN, BSN, [facility] Care Management Coordinator [facility] Phone: [phone] Fax: [phone]

## 2020-04-12 NOTE — Chronic Care Management (AMB) (Signed)
  Care Management   Follow Up Note   04/12/2020 Name: ILIANNA Griffin MRN: [id number] DOB: [birth year]  Referred by: [place], [place], DO Reason for referral : Care Coordination (Care Management RNCM Insurance)   Lydia Griffin is a 59 y.o. year old female who is a primary care patient of [place], [place], DO. The care management team was consulted for assistance with care management and care coordination needs.    Review of patient status, including review of consultants reports, relevant laboratory and other test results, and collaboration with appropriate care team members and the patient's provider was performed as part of comprehensive patient evaluation and provision of chronic care management services.    SDOH (Social Determinants of Health) assessments performed: No See Care Plan activities for detailed interventions related to [facility])     Advanced Directives: See Care Plan and Vynca application for related entries.   Goals Addressed              This Visit's Progress   .  I need Help (pt-stated)        CARE PLAN ENTRY (see longitudinal plan of care for additional care plan information)  Current Barriers:  . Care Coordination needs related to need for insurance  in a patient with Chronic Asthma (disease states) . Chronic Disease Management support and education needs related to Chronic Asthma  Nurse Case Manager Clinical Goal(s):  [name] Kitchen Over the next 30 days, patient will work with RN Case Production designer, theatre/television/film to address needs related to no insurance  Interventions:  . Inter-disciplinary care team collaboration (see longitudinal plan of care) . Evaluation of current treatment plan related to asthma treatment and patient's adherence to plan as established by provider. [name] Kitchen Collaborated with Dereck Ligas  regarding [employer] . Discussed plans with patient for ongoing care management follow up and provided patient with direct contact information for care management team . Collaborated  with LCSW [name] about other options for the pationt regarding insurance . Advised patient to continue to work with [name] for medication assistance to receive her inhalers . Discussed her chronic condition and preventative measures . 04/12/20 . Spoke with the patient and talked with the patient about two options available. . One option is the orange card.  Put the information in an envelope up at the front for the front for the patient to pick up and the other option is for to apply for Obama care. . The patient states that she will try to get by the office soon to get the information.  Patient Self Care Activities:  . Patient verbalizes understanding of plan  . Self administers medications as prescribed . Attends all scheduled provider appointments . Performs ADL's independently . Performs IADL's independently . Calls provider office for new concerns or questions . Unable to independently self navigate system for insurance  Please see past updates related to this goal by clicking on the "Past Updates" button in the selected goal          The care management team will reach out to the patient again over the next 21 days.  The patient has been provided with contact information for the care management team and has been advised to call with any health related questions or concerns.   SIGNATURE

## 2020-04-15 ENCOUNTER — Telehealth: Payer: Self-pay

## 2020-04-15 ENCOUNTER — Ambulatory Visit: Payer: Self-pay | Admitting: Licensed Clinical Social Worker

## 2020-04-15 ENCOUNTER — Other Ambulatory Visit: Payer: Self-pay

## 2020-04-15 NOTE — Chronic Care Management (AMB) (Addendum)
     Social Work Care Management  2nd Unsuccessful  Phone Outreach   04/15/2020 Name: Lydia Griffin MRN: [id number] DOB: 05/13/1961  Referred by: [place], [place], DO ,  Reason for referral : Care Coordination (information and resources)   [name] is a 59 y.o. year old female who sees [place], [place], DO for primary care. 2nd unsuccessful telephone outreach attempt to Ms. [name] today.  A HIPPA compliant phone message was left for the patient providing contact information and requesting a return call.  Plan: LCSW will wait for return call, if no return call is received, will reach out to Ms. [name] again over the next 7 days. If unable to reach Ms. [name] by phone on the 3rd attempt, will discontinue outreach calls but will be available at any time to provide services to Ms. [name] Bilek.   Review of patient status, including review of consultants reports, relevant laboratory and other test results, and collaboration with appropriate care team members and the patient's provider was performed as part of comprehensive patient evaluation and provision of care management services.   [name], LCSW Care Management & Coordination  [facility] / [facility]   [phone] 3:55 PM

## 2020-04-23 ENCOUNTER — Ambulatory Visit: Payer: Self-pay | Admitting: Licensed Clinical Social Worker

## 2020-04-23 NOTE — Chronic Care Management (AMB) (Signed)
   Social Work Care Management  3rd Unsuccessful  Phone Outreach  04/23/2020 Name: Lydia Griffin MRN: [id number] DOB: 01-24-1961  Referred by: Bethena Midget, DO Reason for referral : Care Coordination  For counseling resources(3rd outreach) ;Lydia Griffin is a 59 y.o. year old female who sees [place], [place], DO for primary care. 3rd unsuccessful telephone outreach attempt to [name] today.  A HIPPA compliant phone message was left for the patient providing contact information and requesting a return call.  Review of patient status, including review of consultants reports, relevant laboratory and other test results, and collaboration with appropriate care team members and the patient's provider was performed as part of comprehensive patient evaluation and provision of care management services.   Plan: LCSW will discontinue outreach calls but will gladly be available at any time to provide services to [name].   [name], LCSW Care Management & Coordination  [facility] / [facility]   [phone] 2:38 PM

## 2020-04-26 ENCOUNTER — Encounter (HOSPITAL_COMMUNITY): Payer: Self-pay

## 2020-04-26 ENCOUNTER — Other Ambulatory Visit: Payer: Self-pay

## 2020-04-26 ENCOUNTER — Ambulatory Visit (HOSPITAL_COMMUNITY)
Admission: EM | Admit: 2020-04-26 | Discharge: 2020-04-26 | Disposition: A | Discharge status: Home / Self Care | Payer: Self-pay | Attending: Family Medicine | Admitting: Family Medicine

## 2020-04-26 DIAGNOSIS — Z7721 Contact with and (suspected) exposure to potentially hazardous body fluids: Secondary | ICD-10-CM | POA: Insufficient documentation

## 2020-04-26 LAB — HIV ANTIBODY (ROUTINE TESTING W REFLEX): HIV Screen 4th Generation wRfx: NONREACTIVE

## 2020-04-26 LAB — HEPATITIS C ANTIBODY: HCV Ab: NONREACTIVE

## 2020-04-26 NOTE — Discharge Instructions (Signed)
Follow-up with employee health.
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
## 2020-04-27 ENCOUNTER — Telehealth (HOSPITAL_COMMUNITY): Payer: Self-pay

## 2020-05-03 ENCOUNTER — Ambulatory Visit: Payer: Self-pay

## 2020-05-03 ENCOUNTER — Other Ambulatory Visit: Payer: Self-pay

## 2020-05-03 NOTE — Patient Instructions (Signed)
Visit Information  Goals Addressed              This Visit's Progress   .  I need Help (pt-stated)        CARE PLAN ENTRY (see longitudinal plan of care for additional care plan information)  Current Barriers:  . Care Coordination needs related to need for insurance  in a patient with Chronic Asthma (disease states) . Chronic Disease Management support and education needs related to Chronic Asthma  Nurse Case Manager Clinical Goal(s):  [name] Kitchen Over the next 30 days, patient will work with RN Case Production designer, theatre/television/film to address needs related to no insurance  Interventions:  . Inter-disciplinary care team collaboration (see longitudinal plan of care) . Evaluation of current treatment plan related to asthma treatment and patient's adherence to plan as established by provider. [name] Kitchen Collaborated with Dereck Ligas  regarding [employer] . Discussed plans with patient for ongoing care management follow up and provided patient with direct contact information for care management team . Collaborated with LCSW [name] about other options for the pationt regarding insurance . Advised patient to continue to work with [name] for medication assistance to receive her inhalers . Discussed her chronic condition and preventative measures . 05/03/20 . Spoke with the patient and she stated that she had not done anything as far as insurance.  She stated that she had a lot going on.  She seemed like she had been crying.  I asked the patient would shhe like to talk with some one she stated that she would.  I asked the patient did she have any SI and she stated no.  She just had a lot on her mind. . I collaborated with Dr Shawnee Knapp in the office and she gave several suggestions for the patient but I offered 24 Hour Availability [facility] [address] [place], [place] [employer] [phone] Crisis [phone].  She could call and talk with them and she does not have to worry about having insurance.   The patient was appreciative.  She took the number down.  Read aback and affirmed.  . Patient was given [name] LCSW number to give her a call tomorrow for more coping strategies.  Patient Self Care Activities:  . Patient verbalizes understanding of plan  . Self administers medications as prescribed . Attends all scheduled provider appointments . Performs ADL's independently . Performs IADL's independently . Calls provider office for new concerns or questions . Unable to independently self navigate system for insurance  Please see past updates related to this goal by clicking on the "Past Updates" button in the selected goal         Lydia Griffin was given information about Care Management services today including:  1. Care Management services include personalized support from designated clinical staff supervised by her physician, including individualized plan of care and coordination with other care providers 2. 24/7 contact phone numbers for assistance for urgent and routine care needs. 3. The patient may stop CCM services at any time (effective at the end of the month) by phone call to the office staff.  Patient agreed to services and verbal consent obtained.   The patient verbalized understanding of instructions provided today and declined a print copy of patient instruction materials.   The care management team will reach out to the patient again over the next 3 days.   Lydia Fairly RN, BSN, [facility] Care Management Coordinator [facility] Phone: [phone] Fax: [phone]

## 2020-05-03 NOTE — Chronic Care Management (AMB) (Signed)
Care Management   Follow Up Note   05/03/2020 Name: Lydia Griffin MRN: [id number] DOB: 08/24/1961  Referred by: Bethena Midget, DO Reason for referral : Chronic Care Management (Insurance)   Lydia Griffin is a 59 y.o. year old female who is a primary care patient of Bethena Midget, DO. The care management team was consulted for assistance with care management and care coordination needs.    Review of patient status, including review of consultants reports, relevant laboratory and other test results, and collaboration with appropriate care team members and the patient's provider was performed as part of comprehensive patient evaluation and provision of chronic care management services.    SDOH (Social Determinants of Health) assessments performed: No See Care Plan activities for detailed interventions related to [facility])     Advanced Directives: See Care Plan and Vynca application for related entries.   Goals Addressed              This Visit's Progress   .  I need Help (pt-stated)        CARE PLAN ENTRY (see longitudinal plan of care for additional care plan information)  Current Barriers:  . Care Coordination needs related to need for insurance  in a patient with Chronic Asthma (disease states) . Chronic Disease Management support and education needs related to Chronic Asthma  Nurse Case Manager Clinical Goal(s):  [name] Kitchen Over the next 30 days, patient will work with RN Case Production designer, theatre/television/film to address needs related to no insurance  Interventions:  . Inter-disciplinary care team collaboration (see longitudinal plan of care) . Evaluation of current treatment plan related to asthma treatment and patient's adherence to plan as established by provider. [name] Kitchen Collaborated with Dereck Ligas  regarding [employer] . Discussed plans with patient for ongoing care management follow up and provided patient with direct contact information for care management team . Collaborated with LCSW  [name] about other options for the pationt regarding insurance . Advised patient to continue to work with [name] for medication assistance to receive her inhalers . Discussed her chronic condition and preventative measures . 05/03/20 . Spoke with the patient and she stated that she had not done anything as far as insurance.  She stated that she had a lot going on.  She seemed like she had been crying.  I asked the patient would shhe like to talk with some one she stated that she would.  I asked the patient did she have any SI and she stated no.  She just had a lot on her mind. . I collaborated with Dr Shawnee Knapp in the office and she gave several suggestions for the patient but I offered 24 Hour Availability [facility] [address] [place], [place] [employer] [phone] Crisis [phone].  She could call and talk with them and she does not have to worry about having insurance.  The patient was appreciative.  She took the number down.  Read aback and affirmed.  . Patient was given [name] LCSW number to give her a call tomorrow for more coping strategies.  Patient Self Care Activities:  . Patient verbalizes understanding of plan  . Self administers medications as prescribed . Attends all scheduled provider appointments . Performs ADL's independently . Performs IADL's independently . Calls provider office for new concerns or questions . Unable to independently self navigate system for insurance  Please see past updates related to this goal by clicking on the "Past Updates" button in the selected goal  The care management team will reach out to the patient again over the next 3 days.   Juanell Fairly RN, BSN, [facility] Care Management Coordinator [facility] Phone: [phone] Fax: [phone]

## 2020-05-05 ENCOUNTER — Other Ambulatory Visit: Payer: Self-pay

## 2020-05-05 ENCOUNTER — Ambulatory Visit: Payer: Self-pay

## 2020-05-05 NOTE — Chronic Care Management (AMB) (Signed)
°  Care Management   Outreach Note  05/05/2020 Name: Lydia Griffin MRN: [id number] DOB: September 24, 1961  Referred by: Bethena Midget, DO Reason for referral : Chronic Care Management (Follow up Mood)   An unsuccessful telephone outreach was attempted today. The patient was referred to the case management team for assistance with care management and care coordination.   Follow Up Plan: A HIPPA compliant phone message was left for the patient providing contact information and requesting a return call.  The care management team will reach out to the patient again over the next 5-7 days.   Juanell Fairly RN, BSN, [facility] Care Management Coordinator [facility] Phone: [phone] Fax: [phone]

## 2020-05-11 ENCOUNTER — Ambulatory Visit: Payer: Self-pay

## 2020-05-11 NOTE — Chronic Care Management (AMB) (Signed)
  Care Management   Outreach Note  05/11/2020 Name: Lydia Griffin MRN: [id number] DOB: [birth year]  Referred by: Bethena Midget, DO Reason for referral : Chronic Care Management (2nd attempt Mood)   A second unsuccessful telephone outreach was attempted today. The patient was referred to the case management team for assistance with care management and care coordination.   Follow Up Plan: A HIPPA compliant phone message was left for the patient providing contact information and requesting a return call.  The care management team will reach out to the patient again over the next 5-7 days.   Juanell Fairly RN, BSN, [facility] Care Management Coordinator [facility] Phone: [phone] Fax: [phone]

## 2020-05-17 ENCOUNTER — Ambulatory Visit: Payer: Self-pay

## 2020-05-17 NOTE — Patient Instructions (Signed)
Visit Information  Goals Addressed              This Visit's Progress   .  I need Help (pt-stated)        CARE PLAN ENTRY (see longitudinal plan of care for additional care plan information)  Current Barriers:  . Care Coordination needs related to need for insurance  in a patient with Chronic Asthma (disease states) . Chronic Disease Management support and education needs related to Chronic Asthma  Nurse Case Manager Clinical Goal(s):  [name] Kitchen Over the next 30 days, patient will work with RN Case Production designer, theatre/television/film to address needs related to no insurance  Interventions:  . Inter-disciplinary care team collaboration (see longitudinal plan of care) . Evaluation of current treatment plan related to asthma treatment and patient's adherence to plan as established by provider. [name] Kitchen Collaborated with Dereck Ligas  regarding [employer] . Discussed plans with patient for ongoing care management follow up and provided patient with direct contact information for care management team . Collaborated with LCSW [name] about other options for the pationt regarding insurance . Advised patient to continue to work with [name] for medication assistance to receive her inhalers . Discussed her chronic condition and preventative measures . 05/17/20 . Patient called me to let me know that she was doing ok. . She is working she states just enough to keep her head above water . She has not been able to look into insurance she is trying to get certain things in order . 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  She did not reach out to anyone yet but plans to reach out to someone today.  She would like an appointment to talk.   Patient Self Care Activities:  . Patient verbalizes understanding of plan  . Self administers medications as prescribed . Attends all scheduled provider appointments . Performs ADL's independently . Performs IADL's independently . Calls provider office for new concerns or  questions . Unable to independently self navigate system for insurance  Please see past updates related to this goal by clicking on the "Past Updates" button in the selected goal         Ms. Camba was given information about Care Management services today including:  1. Care Management services include personalized support from designated clinical staff supervised by her physician, including individualized plan of care and coordination with other care providers 2. 24/7 contact phone numbers for assistance for urgent and routine care needs. 3. The patient may stop CCM services at any time (effective at the end of the month) by phone call to the office staff.  Patient agreed to services and verbal consent obtained.   The patient verbalized understanding of instructions provided today and declined a print copy of patient instruction materials.   The care management team will reach out to the patient again over the next 14 days.   Juanell Fairly RN, BSN, [facility] Care Management Coordinator [facility] Phone: [phone] Fax: [phone]

## 2020-05-17 NOTE — Chronic Care Management (AMB) (Signed)
  Care Management   Follow Up Note   05/17/2020 Name: JOVAN SCHICKLING MRN: [id number] DOB: 02-Apr-1961  Referred by: Bethena Midget, DO Reason for referral : Chronic Care Management (Mood)   RADIE BERGES is a 59 y.o. year old female who is a primary care patient of Bethena Midget, DO. The care management team was consulted for assistance with care management and care coordination needs.    Review of patient status, including review of consultants reports, relevant laboratory and other test results, and collaboration with appropriate care team members and the patient's provider was performed as part of comprehensive patient evaluation and provision of chronic care management services.    SDOH (Social Determinants of Health) assessments performed: No See Care Plan activities for detailed interventions related to [facility])     Advanced Directives: See Care Plan and Vynca application for related entries.   Goals Addressed              This Visit's Progress   .  I need Help (pt-stated)        CARE PLAN ENTRY (see longitudinal plan of care for additional care plan information)  Current Barriers:  . Care Coordination needs related to need for insurance  in a patient with Chronic Asthma (disease states) . Chronic Disease Management support and education needs related to Chronic Asthma  Nurse Case Manager Clinical Goal(s):  [name] Kitchen Over the next 30 days, patient will work with RN Case Production designer, theatre/television/film to address needs related to no insurance  Interventions:  . Inter-disciplinary care team collaboration (see longitudinal plan of care) . Evaluation of current treatment plan related to asthma treatment and patient's adherence to plan as established by provider. [name] Kitchen Collaborated with Dereck Ligas  regarding [employer] . Discussed plans with patient for ongoing care management follow up and provided patient with direct contact information for care management team . Collaborated with LCSW [name] about other options for the pationt regarding insurance . Advised patient to continue to work with [name] for medication assistance to receive her inhalers . Discussed her chronic condition and preventative measures . 05/17/20 . Patient called me to let me know that she was doing ok. . She is working she states just enough to keep her head above water . She has not been able to look into insurance she is trying to get certain things in order . 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  She did not reach out to anyone yet but plans to reach out to someone today.  She would like an appointment to talk.   Patient Self Care Activities:  . Patient verbalizes understanding of plan  . Self administers medications as prescribed . Attends all scheduled provider appointments . Performs ADL's independently . Performs IADL's independently . Calls provider office for new concerns or questions . Unable to independently self navigate system for insurance  Please see past updates related to this goal by clicking on the "Past Updates" button in the selected goal          The care management team will reach out to the patient again over the next 14 days.   Juanell Fairly RN, BSN, [facility] Care Management Coordinator [facility] Phone: [phone] Fax: [phone]

## 2020-05-18 ENCOUNTER — Telehealth: Payer: Self-pay | Admitting: Psychology

## 2020-05-18 ENCOUNTER — Ambulatory Visit: Payer: Self-pay

## 2020-05-18 NOTE — Telephone Encounter (Signed)
Pt called [facility] in search for mental health services.  Pt shared that she is struggling and having lots of anxiety especially related to driving.  Pt was offered resources who provide services for patients without insurance.  Pt reported she prefer to stay at [facility] for mental health services even if there is a financial cost.  Pt was scheduled with Dr. Shawnee Knapp for tomorrow at 2pm.  Pt denied SI/HI and was given suicide hotline number in case she needed to talk to anyone outside of office hours.

## 2020-05-18 NOTE — Chronic Care Management (AMB) (Signed)
  Care Management   Follow Up Note   05/18/2020 Name: JOELINE FREER MRN: [id number] DOB: [birth year]  Referred by: Bethena Midget, DO Reason for referral : Chronic Care Management (Mood)   HARMANI NETO is a 59 y.o. year old female who is a primary care patient of Bethena Midget, DO. The care management team was consulted for assistance with care management and care coordination needs.    Review of patient status, including review of consultants reports, relevant laboratory and other test results, and collaboration with appropriate care team members and the patient's provider was performed as part of comprehensive patient evaluation and provision of chronic care management services.    SDOH (Social Determinants of Health) assessments performed: No See Care Plan activities for detailed interventions related to [facility])     Advanced Directives: See Care Plan and Vynca application for related entries.   Goals Addressed              This Visit's Progress   .  I need Help (pt-stated)        CARE PLAN ENTRY (see longitudinal plan of care for additional care plan information)  Current Barriers:  . Care Coordination needs related to need for insurance  in a patient with Chronic Asthma (disease states) . Chronic Disease Management support and education needs related to Chronic Asthma  Nurse Case Manager Clinical Goal(s):  [name] Kitchen Over the next 30 days, patient will work with RN Case Production designer, theatre/television/film to address needs related to no insurance  Interventions:  . Inter-disciplinary care team collaboration (see longitudinal plan of care) . Evaluation of current treatment plan related to asthma treatment and patient's adherence to plan as established by provider. [name] Kitchen Collaborated with Dereck Ligas  regarding [employer] . Discussed plans with patient for ongoing care management follow up and provided patient with direct contact information for care management team . Collaborated with LCSW [name] about other options for the pationt regarding insurance . Advised patient to continue to work with [name] for medication assistance to receive her inhalers . Discussed her chronic condition and preventative measures . 05/18/20 . Patient called me to let me know that she has scheduled and appointment with Dr Shawnee Knapp 05/19/20 @ 2 pm. . She states that she needs to be in the right environment to talk with someone.  [name] Kitchen RNCM congratulated her on making a step to talk with someone to help her . She stated that she appreciated me being concerned an caring. . She states that she will follow up with me next week.  Patient Self Care Activities:  . Patient verbalizes understanding of plan  . Self administers medications as prescribed . Attends all scheduled provider appointments . Performs ADL's independently . Performs IADL's independently . Calls provider office for new concerns or questions . Unable to independently self navigate system for insurance  Please see past updates related to this goal by clicking on the "Past Updates" button in the selected goal          The care management team will reach out to the patient again over the next 14 days.   Juanell Fairly RN, BSN, [facility] Care Management Coordinator [facility] Phone: [phone] Fax: [phone]

## 2020-05-18 NOTE — Patient Instructions (Signed)
Visit Information  Goals Addressed              This Visit's Progress     I need Help (pt-stated)        CARE PLAN ENTRY (see longitudinal plan of care for additional care plan information)  Current Barriers:   Care Coordination needs related to need for insurance  in a patient with Chronic Asthma (disease states)  Chronic Disease Management support and education needs related to Chronic Asthma  Nurse Case Manager Clinical Goal(s):   Over the next 30 days, patient will work with RN Case Manager to address needs related to no insurance  Interventions:   Inter-disciplinary care team collaboration (see longitudinal plan of care)  Evaluation of current treatment plan related to asthma treatment and patient's adherence to plan as established by provider.  Collaborated with Dereck Ligas  regarding [employer]  Discussed plans with patient for ongoing care management follow up and provided patient with direct contact information for care management team  Collaborated with LCSW [name] about other options for the pationt regarding insurance  Advised patient to continue to work with [name] for medication assistance to receive her inhalers  Discussed her chronic condition and preventative measures  05/18/20  Patient called me to let me know that she has scheduled and appointment with Dr Shawnee Knapp 05/19/20 @ 2 pm.  She states that she needs to be in the right environment to talk with someone.   RNCM congratulated her on making a step to talk with someone to help her  She stated that she appreciated me being concerned an caring.  She states that she will follow up with me next week.  Patient Self Care Activities:   Patient verbalizes understanding of plan   Self administers medications as prescribed  Attends all scheduled provider appointments  Performs ADL's independently  Performs IADL's independently  Calls provider office for new concerns or  questions  Unable to independently self navigate system for insurance  Please see past updates related to this goal by clicking on the "Past Updates" button in the selected goal         Ms. Hench was given information about Care Management services today including:  1. Care Management services include personalized support from designated clinical staff supervised by her physician, including individualized plan of care and coordination with other care providers 2. 24/7 contact phone numbers for assistance for urgent and routine care needs. 3. The patient may stop CCM services at any time (effective at the end of the month) by phone call to the office staff.  Patient agreed to services and verbal consent obtained.   The patient verbalized understanding of instructions provided today and declined a print copy of patient instruction materials.   The care management team will reach out to the patient again over the next 14 days.   Juanell Fairly RN, BSN, [facility] Care Management Coordinator [facility] Phone: [phone] Fax: [phone]

## 2020-05-19 ENCOUNTER — Telehealth: Payer: Self-pay

## 2020-05-19 ENCOUNTER — Ambulatory Visit (INDEPENDENT_AMBULATORY_CARE_PROVIDER_SITE_OTHER): Payer: Self-pay | Admitting: Psychology

## 2020-05-19 ENCOUNTER — Other Ambulatory Visit: Payer: Self-pay

## 2020-05-19 DIAGNOSIS — F331 Major depressive disorder, recurrent, moderate: Secondary | ICD-10-CM

## 2020-05-19 NOTE — BH Specialist Note (Signed)
Integrated Behavioral Health Visit   05/19/2020 DONNARAE RAE [id number]   Session Start time: 2  Session End time: 250 Total time: 50   Referring Provider: self referral  PRESENTING CONCERNS: Patient and/or family reports the following symptoms/concerns: Pt reported feeling overwhelmed as she processes her past trauma.  Pt reported about her experiences in prison and relationships with family.  Duration of problem: acutely past few weeks; Severity of problem: moderate  STRENGTHS (Protective Factors/Coping Skills): Pt has insight and drive to improve self  GOALS ADDRESSED: Patient will: 1.  Reduce symptoms of: depression: pt reports syx of sadness, overwhelmed, emotions regulations struggles, tired 2.  Increase knowledge and/or ability of: self-management skills: pt seeking ways to manage her stress and depression 3.  Demonstrate ability to: Increase healthy adjustment to current life circumstances  INTERVENTIONS: Interventions utilized:  Supportive Counseling Standardized Assessments completed: Not Needed  ASSESSMENT: Patient currently experiencing depression related to past traumas in her life.   Patient may benefit from supportive and narrative therapy  PLAN: 1. Follow up with behavioral health clinician on : trauma processing 2. Behavioral recommendations: cont therapy f/u 3. Referral(s): Integrated [employer] (In Clinic)  Royetta Asal, PhD., LMFT-A

## 2020-05-26 ENCOUNTER — Ambulatory Visit: Payer: Self-pay | Admitting: Psychology

## 2020-05-26 ENCOUNTER — Telehealth: Payer: Self-pay | Admitting: *Deleted

## 2020-05-26 NOTE — Chronic Care Management (AMB) (Signed)
  Care Management   Note  05/26/2020 Name: Lydia Griffin MRN: [id number] DOB: Jun 25, 1961  Lydia Griffin is a 59 y.o. year old female who is a primary care patient of Bethena Midget, DO and is actively engaged with the care management team. I reached out to Lydia Griffin by phone today to assist with re-scheduling a follow up visit with the RN Case Manager.  Follow up plan: Telephone appointment with care management team member scheduled for:06/14/2020.  Lydia Griffin  Care Guide, Embedded Care Coordination [facility]  [place], [place] [address] Direct Dial: [phone] [email or web address]: [email or web address]

## 2020-06-02 ENCOUNTER — Telehealth: Payer: Self-pay

## 2020-06-03 ENCOUNTER — Ambulatory Visit: Payer: Self-pay | Admitting: Psychology

## 2020-06-14 ENCOUNTER — Other Ambulatory Visit: Payer: Self-pay

## 2020-06-14 ENCOUNTER — Ambulatory Visit (INDEPENDENT_AMBULATORY_CARE_PROVIDER_SITE_OTHER): Payer: Self-pay | Admitting: Psychology

## 2020-06-14 ENCOUNTER — Telehealth: Payer: Self-pay

## 2020-06-14 DIAGNOSIS — F331 Major depressive disorder, recurrent, moderate: Secondary | ICD-10-CM

## 2020-06-14 NOTE — Telephone Encounter (Signed)
  Care Management   Outreach Note  06/14/2020 Name: Lydia Griffin MRN: [id number] DOB: 12-03-1960  Referred by: Cora Collum, DO Reason for referral : Appointment ([place])   An unsuccessful telephone outreach was attempted today. The patient was referred to the case management team for assistance with care management and care coordination.   Follow Up Plan: A HIPPA compliant phone message was left for the patient providing contact information and requesting a return call.  The care management team will reach out to the patient again over the next 7-14 days.   Juanell Fairly RN, BSN, [facility] Care Management Coordinator [facility] Phone: [phone] Fax: [phone]

## 2020-06-14 NOTE — BH Specialist Note (Signed)
Integrated Behavioral Health Visit via Telemedicine (Telephone)  06/14/2020 Lydia Griffin [id number]   Session Start time: 9  Session End time: 930 Total time: 30 minutes  Referring Provider: Dr. Idalia Needle self-referral  Type of Visit: Telephonic Patient location: home [facility] Provider location: [facility] All persons participating in visit: pt and provider   The following statements were read to the patient and/or legal guardian that are established with the [facility] Provider.  "The purpose of this phone visit is to provide behavioral health care while limiting exposure to the coronavirus (COVID19).  There is a possibility of technology failure and discussed alternative modes of communication if that failure occurs."  "By engaging in this telephone visit, you consent to the provision of healthcare.  Additionally, you authorize for your insurance to be billed for the services provided during this telephone visit."   Patient and/or legal guardian consented to telephone visit: Yes   PRESENTING CONCERNS: Patient and/or family reports the following symptoms/concerns: Pt reported feeling anxious and overwhelmed today. Pt and Dr. Shawnee Knapp discussed how trauma impacts her current mood.  Pt and Dr. Shawnee Knapp talked about her 3 stressors including: work, finances, and future.  Pt and Dr. Shawnee Knapp discussed why these might be causing anxiety and how lack of control stems for anxiety.  Duration of problem: acutely past month; Severity of problem: moderate  STRENGTHS (Protective Factors/Coping Skills): Insight and drive to self-improve   GOALS ADDRESSED: Patient will: 1.  Reduce symptoms of: depression: pt expresses feeling overwhelmed, emotional reactions, anxious, sad and tiredness  2.  Increase knowledge and/or ability of: self-management skills: pt plans to engage in creating space at work for decompression; pt engaged in self-insight activity during appt to think about anxiety role in life 3.  Demonstrate  ability to: Increase healthy adjustment to current life circumstances  INTERVENTIONS: Interventions utilized:  Brief CBT and supportive counseling  Standardized Assessments completed: Not Needed  ASSESSMENT: Patient currently experiencing depression with anxiety syx related to past traumas in her life  Patient may benefit from supportive therapy, narrative and some CBT  PLAN: 1. Follow up with behavioral health clinician on : taking breaks during work for mental space  2. Behavioral recommendations:self-care and reflection 3. Referral(s): Integrated [employer] (In Clinic)  Royetta Asal, PhD., LMFT-A

## 2020-06-16 NOTE — Telephone Encounter (Signed)
Spoke to patient rescheduled telephone call with RN CM for 07/02/2020 she had forgot about call.

## 2020-06-21 ENCOUNTER — Ambulatory Visit (INDEPENDENT_AMBULATORY_CARE_PROVIDER_SITE_OTHER): Payer: Self-pay | Admitting: Psychology

## 2020-06-21 DIAGNOSIS — F331 Major depressive disorder, recurrent, moderate: Secondary | ICD-10-CM

## 2020-06-21 NOTE — BH Specialist Note (Signed)
Integrated Behavioral Health Visit via Telemedicine (Telephone)  06/21/2020 Lydia Griffin [id number]   Session Start time: 9  Session End time: 930 Total time: 30 minutes  Referring Provider: Self referral  Type of Visit: Telephonic Patient location: home [facility] Provider location: [facility] All persons participating in visit: pt and provider   The following statements were read to the patient and/or legal guardian that are established with the [facility] Provider.  "The purpose of this phone visit is to provide behavioral health care while limiting exposure to the coronavirus (COVID19).  There is a possibility of technology failure and discussed alternative modes of communication if that failure occurs."  "By engaging in this telephone visit, you consent to the provision of healthcare.  Additionally, you authorize for your insurance to be billed for the services provided during this telephone visit."   PRESENTING CONCERNS: Patient and/or family reports the following symptoms/concerns: Pt reported improved anxiety due to new coping strategies she has engaged in. Pt reported she has began to start yoga again and it has been helpful for her.  Pt also reported thinking about making plans for her future has been helpful for her in managing her anxiety. Pt reported insight related to how anxiety impacts depression.  Duration of problem: past month; Severity of problem: moderate  STRENGTHS (Protective Factors/Coping Skills): Insight and drive to self-improve   GOALS ADDRESSED: Patient will: 1.  Reduce symptoms of: anxiety causing depression: pt reports feeling overwhelmed, emotional reactions, anxious, sad and tired  2.  Increase knowledge and/or ability of: self-management skills: Pt is engaging in reflection of syx. Pt started behavior change of doing yoga to manage her anxiety  3.  Demonstrate ability to: Increase healthy adjustment to current life  circumstances  INTERVENTIONS: Interventions utilized:  Brief CBT Standardized Assessments completed: Not Needed  ASSESSMENT: Patient currently experiencing depression with anxiety syx related to past traumas in her life   Patient may benefit from supportive therapy and CBT.  PLAN: 1. Follow up with behavioral health clinician on : f/u in 1 week 2. Behavioral recommendations: self-care, reflection, yoga  3. Referral(s): Integrated [employer] (In Clinic)  Royetta Asal, PhD., LMFT-A

## 2020-06-25 ENCOUNTER — Other Ambulatory Visit: Payer: Self-pay

## 2020-06-25 NOTE — Telephone Encounter (Signed)
Patient calls nurse line requesting a refill on albuterol inhaler and prednisone. Patient reports with the change in weather she has noticed a change in breathing. Patient is speaking in full sentences and states its not "bad," just with the long weekend would like to have on hand. Patient reports she often gets prednisone for asthma flares. I offered her an apt today, however she reports she does not have a ride. I scheduled her for 9/10 so she can plan for a ride. Strict ED precautions given. Please advise on prednisone and albuterol.

## 2020-06-25 NOTE — Telephone Encounter (Signed)
Patient returning call to nurse line to check on status of receiving albuterol inhaler and prednisone.   To PCP  [name], RN

## 2020-06-27 MED ORDER — ALBUTEROL SULFATE HFA 108 (90 BASE) MCG/ACT IN AERS: 2.0000 | INHALATION_SPRAY | Freq: Four times a day (QID) | RESPIRATORY_TRACT | 0 refills | Status: DC | PRN

## 2020-07-01 ENCOUNTER — Telehealth: Payer: Self-pay | Admitting: Psychology

## 2020-07-01 ENCOUNTER — Encounter: Payer: Self-pay | Admitting: Psychology

## 2020-07-01 NOTE — Telephone Encounter (Signed)
Called pt twice and left VM for virtual appt

## 2020-07-02 ENCOUNTER — Telehealth: Payer: Self-pay

## 2020-07-02 ENCOUNTER — Ambulatory Visit: Payer: Self-pay | Admitting: Family Medicine

## 2020-07-02 NOTE — Telephone Encounter (Signed)
  Care Management   Outreach Note  07/02/2020 Name: Lydia Griffin MRN: [id number] DOB: 1960/11/01  Referred by: Cora Collum, DO Reason for referral : Appointment ([place])   A second unsuccessful telephone outreach was attempted today. The patient was referred to the case management team for assistance with care management and care coordination.   Follow Up Plan: The care management team will reach out to the patient again over the next 7-14 days.   Juanell Fairly RN, BSN, [facility] Care Management Coordinator [facility] Phone: [phone] Fax: [phone]

## 2020-07-05 NOTE — Telephone Encounter (Signed)
Lydia Griffin   Patient is back working at [employer] and is getting called in she said she works nights usually and has been missing your call because she was working. Patient is rescheduled for 9/30 in AM.   Thanks   Misty Stanley

## 2020-07-09 ENCOUNTER — Ambulatory Visit: Payer: Self-pay | Admitting: Family Medicine

## 2020-07-09 NOTE — Progress Notes (Deleted)
    SUBJECTIVE:   CHIEF COMPLAINT / HPI:   Asthma Patient is here for evaluation of asthma. Patient's symptoms include {asthma symptoms:406::"dyspnea","non-productive cough","wheezing"}. Associated symptoms include {respiratory symptoms:16811}. The patient has been suffering from these symptoms for approximately {numbers 1-12:10294} {Time; units w/plural:11::"years"}. Symptoms have been {clinical course - history:17::"unchanged"} since their onset. Medications used in the past to treat these symptoms include {asthma treatment:11087}. Suspected precipitants include {asthma precips:408}. Patient is awoken from sleep approximately {0-5 (w/ >5):11666} times per night. Patient {has/not:18111::"has not"} required emergency room treatment for these symptoms, and {has/not:18111::"has not"} required hospitalization. The patient {has/not:18111::"has not"} been intubated in the past.   PERTINENT  PMH / PSH: ***  OBJECTIVE:   There were no vitals taken for this visit.  ***  ASSESSMENT/PLAN:   No problem-specific Assessment & Plan notes found for this encounter.     Dana Allan, MD [facility]

## 2020-07-12 ENCOUNTER — Ambulatory Visit (INDEPENDENT_AMBULATORY_CARE_PROVIDER_SITE_OTHER): Payer: Self-pay | Admitting: Psychology

## 2020-07-12 NOTE — BH Specialist Note (Signed)
Attempted to call pt twice and left VM

## 2020-07-22 ENCOUNTER — Telehealth: Payer: Self-pay

## 2020-07-22 NOTE — Telephone Encounter (Signed)
°  Care Management   Outreach Note  07/22/2020 Name: Lydia Griffin MRN: [id number] DOB: 06-Feb-1961  Referred by: Cora Collum, DO Reason for referral : Appointment (mood) and Chronic Care Management   Third unsuccessful telephone outreach was attempted today. The patient was referred to the case management team for assistance with care management and care coordination. The patient's primary care provider has been notified of our unsuccessful attempts to make or maintain contact with the patient. The care management team is pleased to engage with this patient at any time in the future should he/she be interested in assistance from the care management team.   Follow Up Plan: No further follow up required:   Juanell Fairly RN, BSN, [facility] Care Management Coordinator [facility] Phone: [phone] Fax: [phone]

## 2020-07-23 ENCOUNTER — Telehealth: Payer: Self-pay | Admitting: Psychology

## 2020-07-27 ENCOUNTER — Other Ambulatory Visit: Payer: Self-pay

## 2020-07-27 ENCOUNTER — Ambulatory Visit (INDEPENDENT_AMBULATORY_CARE_PROVIDER_SITE_OTHER): Payer: Self-pay

## 2020-07-27 ENCOUNTER — Ambulatory Visit (INDEPENDENT_AMBULATORY_CARE_PROVIDER_SITE_OTHER): Payer: Self-pay | Admitting: Psychology

## 2020-07-27 DIAGNOSIS — F331 Major depressive disorder, recurrent, moderate: Secondary | ICD-10-CM

## 2020-07-27 DIAGNOSIS — Z23 Encounter for immunization: Secondary | ICD-10-CM

## 2020-07-27 NOTE — Progress Notes (Signed)
   Covid-19 Vaccination Clinic  Name:  AUDYN DIMERCURIO    MRN: [id number] DOB: 1960-10-25  07/27/2020  Ms. Abrams was observed post Covid-19 immunization for 15 minutes without incident. She was provided with Vaccine Information Sheet and instruction to access the V-Safe system.   Ms. Cocke was instructed to call 911 with any severe reactions post vaccine: [name] Kitchen Difficulty breathing  . Swelling of face and throat  . A fast heartbeat  . A bad rash all over body  . Dizziness and weakness   #1 Covid Vaccine administered LD without complication.  #2 Covid Vaccine due 08/17/2020.  Patient requests Flu Vaccine.  Flu Vaccine administered RD without complication.  See admin for details.
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
## 2020-08-03 ENCOUNTER — Ambulatory Visit: Payer: Self-pay | Admitting: Psychology

## 2020-08-04 NOTE — Telephone Encounter (Signed)
Called pt and left VM.  Pt wanted to reschedule appt.

## 2020-08-17 ENCOUNTER — Ambulatory Visit: Payer: Self-pay

## 2020-08-29 ENCOUNTER — Other Ambulatory Visit: Payer: Self-pay | Admitting: Family Medicine

## 2020-10-12 ENCOUNTER — Other Ambulatory Visit: Payer: Self-pay | Admitting: Family Medicine

## 2020-10-18 ENCOUNTER — Other Ambulatory Visit: Payer: Self-pay | Admitting: Family Medicine

## 2020-10-18 NOTE — Telephone Encounter (Signed)
Patient calls nurse line requesting another albuterol inhaler. Patient reports one was just refilled last week, however she has misplaced it. Please advise on additional refill.

## 2020-10-18 NOTE — Telephone Encounter (Signed)
I sent it in.  Thanks

## 2020-11-01 ENCOUNTER — Telehealth: Payer: Self-pay

## 2020-11-01 NOTE — Telephone Encounter (Signed)
Sounds good thanks for letting me know °

## 2020-11-01 NOTE — Telephone Encounter (Signed)
Patient calls nurse line reporting a "mild" asthma attack. Patient has a significant history of asthma. Patient reports she was using [facility], however she is no longer able to get this medication. Patient is speaking in full sentences. Patient is using her albuterol inhaler and nebulizer. Patient stresses she does not want to have to go to an ED or UC, however she will go if she has to. Patient scheduled for tomorrow, strict ED precautions given.

## 2020-11-02 ENCOUNTER — Other Ambulatory Visit: Payer: Self-pay

## 2020-11-02 ENCOUNTER — Ambulatory Visit (INDEPENDENT_AMBULATORY_CARE_PROVIDER_SITE_OTHER): Payer: Self-pay | Admitting: Family Medicine

## 2020-11-02 VITALS — BP 122/70 | HR 68 | Ht 65.0 in | Wt 164.0 lb

## 2020-11-02 DIAGNOSIS — J4550 Severe persistent asthma, uncomplicated: Secondary | ICD-10-CM

## 2020-11-02 DIAGNOSIS — Z23 Encounter for immunization: Secondary | ICD-10-CM

## 2020-11-02 DIAGNOSIS — J45901 Unspecified asthma with (acute) exacerbation: Secondary | ICD-10-CM

## 2020-11-02 MED ORDER — PREDNISONE 20 MG PO TABS: 40.0000 mg | ORAL_TABLET | Freq: Every day | ORAL | 0 refills | Status: AC

## 2020-11-02 MED ORDER — IPRATROPIUM-ALBUTEROL 0.5-2.5 (3) MG/3ML IN SOLN: 3.0000 mL | RESPIRATORY_TRACT | 1 refills | Status: DC | PRN

## 2020-11-02 MED ORDER — BREO ELLIPTA 100-25 MCG/INH IN AEPB: 1.0000 | INHALATION_SPRAY | Freq: Every day | RESPIRATORY_TRACT | 1 refills | Status: DC

## 2020-11-02 NOTE — Patient Instructions (Addendum)
It was great meeting you today!   Stop by the pharmacy to pick up your steroids. You were provided samples of Breo Ellipta to better control your asthma.  Please use this medication daily.   I placed a referred to our chronic care management office to better assess if there are any programs to help you afford your asthma medication. They will likely contact you in the coming weeks.   If you have increasing shortness of breath, new or worsening chest pain go directly to the ED.    If you have questions or concerns please call at [phone].  Dr. Katherina Right [facility]

## 2020-11-02 NOTE — Progress Notes (Signed)
   Covid-19 Vaccination Clinic  Name:  Lydia Griffin    MRN: [id number] DOB: [birth year]  11/02/2020  Ms. [name] was observed post Covid-19 immunization for 15 minutes without incident. She was provided with Vaccine Information Sheet and instruction to access the V-Safe system.   Ms. [name] was instructed to call 911 with any severe reactions post vaccine: [name] Kitchen Difficulty breathing  . Swelling of face and throat  . A fast heartbeat  . A bad rash all over body  . Dizziness and weakness    Provided patient with updated immunization card and record.   [name], RN

## 2020-11-02 NOTE — Progress Notes (Signed)
   SUBJECTIVE:   CHIEF COMPLAINT / HPI:   Chief Complaint  Patient presents with  . Asthma     Lydia Griffin is a 60 y.o. female here for asthma.  Asthma Patient reports at least 1 asthma exacerbation awake for the past month or so.  She has not been able to afford her controller medication, Dulera, as a medical program she was no longer carries asthma medication.  States that she has been using her albuterol inhaler and nebulizers more frequently as she does not have controller medications.  Feels as if she is more short of breath than she is today.  Cough is worse at night.  She does not smoke.  Denies fever, chest pain, sore throat, myalgias.      PERTINENT  PMH / PSH: reviewed and updated as appropriate   OBJECTIVE:   BP 122/70   Pulse 68   Ht 5\' 5"  (1.651 m)   Wt 164 lb (74.4 kg)   SpO2 94%   BMI 27.29 kg/m    GEN: well appearing female, in no acute distress  CV: regular rate and rhythm, no murmurs appreciated  RESP: no increased work of breathing, diffuse wheezing anteriorly and posteriorly,  SKIN: warm, dry, brisk cap refill    ASSESSMENT/PLAN:   Asthma exacerbation Chronic and uncontrolled.  Prednisone 40 x 5 days.  Refilled DuoNeb's.  Given samples of Breo elliptica as there are no Dulera samples available. Pt having trouble affording her prescriptions.  Referral to CCM placed.  Advised to go to the ED if breathing worsens.  Follow-up in clinic on Friday, if not improving.   COVID-vaccine discussed and recommended. Patient would like COVID-vaccine today.  Wednesday, DO PGY-2, [facility] 11/02/2020

## 2020-11-03 ENCOUNTER — Telehealth: Payer: Self-pay

## 2020-11-03 NOTE — Telephone Encounter (Signed)
MA1/09/2021 1st Attempt  Name: Lydia Griffin   MRN: [id number]   DOB: 05-12-1961   AGE: 60 y.o.   GENDER: female   PCP Cora Collum, DO.   Unsuccessful outbound call made today to assist with:  Medication Assistance  Outreach Attempt:  1st Attempt  A HIPAA compliant voice message was left requesting a return call.  Instructed patient to call back at [phone].    Saturnino Liew, AAS Paralegal, [facility] Care Guide . Embedded Care Coordination [facility]  Care Management  [address] [place], [place] [address] [email or web address]  [phone]   [email or web address]

## 2020-11-03 NOTE — Assessment & Plan Note (Addendum)
Chronic and uncontrolled.  Prednisone 40 x 5 days.  Refilled DuoNeb's.  Given samples of Breo elliptica as there are no Dulera samples available. Pt having trouble affording her prescriptions.  Referral to CCM placed.  Advised to go to the ED if breathing worsens.  Follow-up in clinic on Friday, if not improving.

## 2020-11-05 ENCOUNTER — Telehealth: Payer: Self-pay

## 2020-11-05 NOTE — Telephone Encounter (Signed)
Patient LVM on nurse line requesting another round of prednisone. Patient reports she has finished the 5 day course, however she is still wheezing "some." Patient reports no significant change. Patient is speaking in full sentences. I attempted to call her back for ED precautions, however no answer. Will forward to provider who saw patient.

## 2020-11-05 NOTE — Telephone Encounter (Signed)
Please make an appointment to see if another round of steroids is appropriate.   Thanks   [employer]

## 2020-11-09 ENCOUNTER — Telehealth: Payer: Self-pay

## 2020-11-09 NOTE — Telephone Encounter (Signed)
Telephone encounter was:  Unsuccessful.  11/09/2020 Name: Lydia Griffin MRN: [id number] DOB: [birth year]  Unsuccessful outbound call made today to assist with:  Medication assistance  Outreach Attempt:  2nd Attempt  A HIPAA compliant voice message was left requesting a return call.  Instructed patient to call back at [phone]  Ricarda Frame, AAS Paralegal, [facility] Care Guide . Embedded Care Coordination [facility]  Care Management  [address] [place], [place] [address] [email or web address]  [phone]   [email or web address]

## 2020-11-10 ENCOUNTER — Telehealth: Payer: Self-pay

## 2020-11-10 NOTE — Telephone Encounter (Signed)
Telephone encounter was:  Unsuccessful.  11/10/2020 Name: Lydia Griffin MRN: [id number] DOB: [birth year]  Unsuccessful outbound call made today to assist with:  Medication assistance  Outreach Attempt:  2nd Attempt  A HIPAA compliant voice message was left requesting a return call.  Instructed patient to call back at [phone]  Ricarda Frame, AAS Paralegal, [facility] Care Guide . Embedded Care Coordination [facility]  Care Management  [address] [place], [place] [address] [email or web address]  [phone]   [email or web address]

## 2020-11-12 ENCOUNTER — Other Ambulatory Visit: Payer: Self-pay | Admitting: Family Medicine

## 2020-11-12 DIAGNOSIS — J4550 Severe persistent asthma, uncomplicated: Secondary | ICD-10-CM

## 2020-11-15 NOTE — Telephone Encounter (Signed)
Patient calls nurse line checking the status of inhaler refill. Please advise.

## 2020-11-16 MED ORDER — TRELEGY ELLIPTA 200-62.5-25 MCG/INH IN AEPB: 1.0000 | INHALATION_SPRAY | Freq: Every day | RESPIRATORY_TRACT | 0 refills | Status: DC

## 2020-11-16 NOTE — Addendum Note (Signed)
Addended by: Kathrin Ruddy on: 11/16/2020 02:40 PM   Modules accepted: Orders

## 2020-11-16 NOTE — Telephone Encounter (Signed)
Contact by Pharmacy Candidate Michiel Cowboy.  Informed patient sample was ready for pick-up.   Patient plans to pick up soon.   Medication Samples have been provided to the patient.  Drug name: Trelegy   Qty: 2 inhalers (28 day supply))  LOT: [id number]  Exp.Date: 11/22/2021  The patient has been instructed regarding the correct time, dose, and frequency of taking this medication, including desired effects and most common side effects.   Madelon Lips 2:38 PM 11/16/2020

## 2020-11-16 NOTE — Telephone Encounter (Signed)
Patient returns call to nurse line to check status of inhaler rx being sent to pharmacy. Informed patient that inhaler was sent in yesterday afternoon.   Patient is also requesting a refill on prednisone. Patient reports that while symptoms have improved she is still having a continued cough, mild wheezing and increased SHOB on exertion. Patient is not currently having SHOB and is able to speak in complete sentences. Patient is supposed to return to work [employer]. Informed patient that she may need to schedule f/u appointment for continued rx for steroid.   Patient is also requesting trelegy samples. Will route to Dr. Raymondo Band as well. In med room, we have 2 samples of 200 mcg inhalers.   Please advise  Lydia Prude, RN

## 2020-11-16 NOTE — Telephone Encounter (Signed)
I spoke to Ms. Casseus and she felt that she did not get much relief from the [facility] she was prescribed earlier in the month. She would like to sample the Trelogy (has used it briefly in the past and found relief with that) and we can potentially switch to continued use of the Trelogy since she is using her Albuterol inhaler too frequently. She has reported increased cough and SOB over the last few weeks especially with exertion, but at this time not experiencing those symptoms. We opted to forego the prednisone for now and see how the Trelogy works for her and she will schedule a follow up with me once she gets her work schedule.   Can we leave the sample for her at the front desk? She said she should be able to get a ride later this afternoon to pick it up.   Let me know if this sounds ok or if there is anything in addition that you would recommend Dr. Raymondo Band. Thank you!

## 2020-11-17 ENCOUNTER — Telehealth: Payer: Self-pay

## 2020-11-17 NOTE — Telephone Encounter (Signed)
Telephone encounter was:  Unsuccessful.  11/17/2020 Name: Lydia Griffin MRN: [id number] DOB: 09-21-1961  Unsuccessful outbound call made today to assist with:  medication assistance  Outreach Attempt:  3rd Attempt.  Referral closed unable to contact patient.  A HIPAA compliant voice message was left requesting a return call.  Instructed patient to call back at [phone].  Patte Winkel, AAS Paralegal, [facility] Care Guide . Embedded Care Coordination [facility]  Care Management  [address] [place], [place] [address] ??[email or web address]  ?? [phone]   [email or web address]

## 2020-12-05 ENCOUNTER — Other Ambulatory Visit: Payer: Self-pay | Admitting: Family Medicine

## 2020-12-05 DIAGNOSIS — J45901 Unspecified asthma with (acute) exacerbation: Secondary | ICD-10-CM

## 2020-12-08 ENCOUNTER — Other Ambulatory Visit: Payer: Self-pay | Admitting: Family Medicine

## 2021-01-05 ENCOUNTER — Other Ambulatory Visit: Payer: Self-pay | Admitting: Family Medicine

## 2021-01-05 DIAGNOSIS — J45901 Unspecified asthma with (acute) exacerbation: Secondary | ICD-10-CM

## 2021-01-09 ENCOUNTER — Other Ambulatory Visit: Payer: Self-pay | Admitting: Family Medicine

## 2021-01-09 ENCOUNTER — Telehealth: Payer: Self-pay | Admitting: Family Medicine

## 2021-01-09 MED ORDER — ALBUTEROL SULFATE HFA 108 (90 BASE) MCG/ACT IN AERS: 2.0000 | INHALATION_SPRAY | Freq: Four times a day (QID) | RESPIRATORY_TRACT | 0 refills | Status: DC | PRN

## 2021-01-09 MED ORDER — PREDNISONE 20 MG PO TABS: 40.0000 mg | ORAL_TABLET | Freq: Every day | ORAL | 0 refills | Status: AC

## 2021-01-09 NOTE — Telephone Encounter (Signed)
Received call from OOH line regarding asthma exacerbation. Patient reports albuterol puffs, trillegy, duonebs, albuterol nebs and Singulair. She reports coughing and wheezing since Thursday which is worsening. Sats>96% at rest. Sats drop to early 90s on exertion. On exertion she feels like she is about to pass out. 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 She said she will get fired if she does not turn up to work today. Of note pt has previous intubations x 2 and has been admitted to hospital for asthma exacerbation.   I explained to the pt that she is likely has a asthma exacerbation and needs to be evaluated in the ER ASAP. She is reluctant to go the ER due to the potential of getting fired. I therefore recommended prednisone 40mg  for 5 days and recommended doing duoneb, albuterol nebs right now and to the ER urgently. ATC booked for 3/21.  Strict ER precautions provided to patient who expressed understanding. Will forward to patient's PCP Dr 4/21.  Idalia Needle MD PGY-2, Family Medicine

## 2021-01-10 NOTE — Telephone Encounter (Signed)
Thank you Poonam!

## 2021-01-11 ENCOUNTER — Ambulatory Visit (INDEPENDENT_AMBULATORY_CARE_PROVIDER_SITE_OTHER): Payer: Self-pay | Admitting: Family Medicine

## 2021-01-11 DIAGNOSIS — J4550 Severe persistent asthma, uncomplicated: Secondary | ICD-10-CM

## 2021-01-11 DIAGNOSIS — Z5329 Procedure and treatment not carried out because of patient's decision for other reasons: Secondary | ICD-10-CM

## 2021-01-11 NOTE — Progress Notes (Signed)
Patient was a no-show to her appointment today 01/11/2021.  Can reschedule as needed.  Peggyann Shoals, DO [facility] Family Medicine, PGY-3 01/11/2021 1:07 PM

## 2021-01-20 ENCOUNTER — Other Ambulatory Visit: Payer: Self-pay | Admitting: Family Medicine

## 2021-01-21 ENCOUNTER — Inpatient Hospital Stay (HOSPITAL_COMMUNITY)
Admission: EM | Admit: 2021-01-21 | Discharge: 2021-01-24 | DRG: 871 | Disposition: A | Discharge status: Home / Self Care | Payer: Self-pay | Attending: Internal Medicine | Admitting: Internal Medicine

## 2021-01-21 ENCOUNTER — Emergency Department (HOSPITAL_COMMUNITY)
Admission: EM | Admit: 2021-01-21 | Discharge: 2021-01-21 | Disposition: A | Discharge status: Home / Self Care | Payer: Self-pay | Attending: Emergency Medicine | Admitting: Emergency Medicine

## 2021-01-21 ENCOUNTER — Encounter (HOSPITAL_COMMUNITY): Payer: Self-pay

## 2021-01-21 ENCOUNTER — Other Ambulatory Visit: Payer: Self-pay

## 2021-01-21 ENCOUNTER — Emergency Department (HOSPITAL_COMMUNITY): Payer: Self-pay

## 2021-01-21 ENCOUNTER — Telehealth: Payer: Self-pay | Admitting: Family Medicine

## 2021-01-21 ENCOUNTER — Ambulatory Visit (INDEPENDENT_AMBULATORY_CARE_PROVIDER_SITE_OTHER): Payer: Self-pay | Admitting: Family Medicine

## 2021-01-21 DIAGNOSIS — J4551 Severe persistent asthma with (acute) exacerbation: Secondary | ICD-10-CM

## 2021-01-21 DIAGNOSIS — A419 Sepsis, unspecified organism: Principal | ICD-10-CM | POA: Diagnosis present

## 2021-01-21 DIAGNOSIS — Z20822 Contact with and (suspected) exposure to covid-19: Secondary | ICD-10-CM | POA: Diagnosis present

## 2021-01-21 DIAGNOSIS — Z825 Family history of asthma and other chronic lower respiratory diseases: Secondary | ICD-10-CM

## 2021-01-21 DIAGNOSIS — Z7722 Contact with and (suspected) exposure to environmental tobacco smoke (acute) (chronic): Secondary | ICD-10-CM | POA: Diagnosis present

## 2021-01-21 DIAGNOSIS — M549 Dorsalgia, unspecified: Secondary | ICD-10-CM | POA: Diagnosis present

## 2021-01-21 DIAGNOSIS — R062 Wheezing: Secondary | ICD-10-CM | POA: Insufficient documentation

## 2021-01-21 DIAGNOSIS — F32A Depression, unspecified: Secondary | ICD-10-CM | POA: Diagnosis present

## 2021-01-21 DIAGNOSIS — Z5321 Procedure and treatment not carried out due to patient leaving prior to being seen by health care provider: Secondary | ICD-10-CM | POA: Insufficient documentation

## 2021-01-21 DIAGNOSIS — Z79899 Other long term (current) drug therapy: Secondary | ICD-10-CM

## 2021-01-21 DIAGNOSIS — R519 Headache, unspecified: Secondary | ICD-10-CM | POA: Diagnosis present

## 2021-01-21 DIAGNOSIS — Z811 Family history of alcohol abuse and dependence: Secondary | ICD-10-CM

## 2021-01-21 DIAGNOSIS — Z5320 Procedure and treatment not carried out because of patient's decision for unspecified reasons: Secondary | ICD-10-CM

## 2021-01-21 DIAGNOSIS — Z7951 Long term (current) use of inhaled steroids: Secondary | ICD-10-CM

## 2021-01-21 DIAGNOSIS — J441 Chronic obstructive pulmonary disease with (acute) exacerbation: Secondary | ICD-10-CM | POA: Diagnosis present

## 2021-01-21 DIAGNOSIS — Z9114 Patient's other noncompliance with medication regimen: Secondary | ICD-10-CM

## 2021-01-21 DIAGNOSIS — R6520 Severe sepsis without septic shock: Secondary | ICD-10-CM | POA: Diagnosis present

## 2021-01-21 DIAGNOSIS — Z8249 Family history of ischemic heart disease and other diseases of the circulatory system: Secondary | ICD-10-CM

## 2021-01-21 DIAGNOSIS — J9601 Acute respiratory failure with hypoxia: Secondary | ICD-10-CM

## 2021-01-21 DIAGNOSIS — J849 Interstitial pulmonary disease, unspecified: Secondary | ICD-10-CM | POA: Diagnosis present

## 2021-01-21 DIAGNOSIS — Z9119 Patient's noncompliance with other medical treatment and regimen: Secondary | ICD-10-CM

## 2021-01-21 DIAGNOSIS — Z28311 Partially vaccinated for covid-19: Secondary | ICD-10-CM

## 2021-01-21 DIAGNOSIS — J0100 Acute maxillary sinusitis, unspecified: Secondary | ICD-10-CM

## 2021-01-21 DIAGNOSIS — J449 Chronic obstructive pulmonary disease, unspecified: Secondary | ICD-10-CM | POA: Diagnosis present

## 2021-01-21 DIAGNOSIS — J45901 Unspecified asthma with (acute) exacerbation: Secondary | ICD-10-CM | POA: Diagnosis present

## 2021-01-21 HISTORY — DX: Acute respiratory failure with hypoxia: J96.01

## 2021-01-21 LAB — COMPREHENSIVE METABOLIC PANEL
ALT: 28 U/L (ref 0–44)
AST: 24 U/L (ref 15–41)
Albumin: 3.9 g/dL (ref 3.5–5.0)
Alkaline Phosphatase: 110 U/L (ref 38–126)
Anion gap: 9 (ref 5–15)
BUN: 7 mg/dL (ref 6–20)
CO2: 22 mmol/L (ref 22–32)
Calcium: 9.4 mg/dL (ref 8.9–10.3)
Chloride: 104 mmol/L (ref 98–111)
Creatinine, Ser: 0.69 mg/dL (ref 0.44–1.00)
GFR, Estimated: >60 mL/min (ref >60)
Glucose, Bld: 102 mg/dL — ABNORMAL HIGH (ref 70–99)
Potassium: 3.9 mmol/L (ref 3.5–5.1)
Sodium: 135 mmol/L (ref 135–145)
Total Bilirubin: 0.7 mg/dL (ref 0.3–1.2)
Total Protein: 7.2 g/dL (ref 6.5–8.1)

## 2021-01-21 LAB — CBC WITH DIFFERENTIAL/PLATELET
Abs Immature Granulocytes: 0.03 10*3/uL (ref 0.00–0.07)
Abs Immature Granulocytes: 0.03 10*3/uL (ref 0.00–0.07)
Basophils Absolute: 0 10*3/uL (ref 0.0–0.1)
Basophils Absolute: 0.1 10*3/uL (ref 0.0–0.1)
Basophils Relative: 0 %
Basophils Relative: 1 %
Eosinophils Absolute: 0.3 10*3/uL (ref 0.0–0.5)
Eosinophils Absolute: 0.3 10*3/uL (ref 0.0–0.5)
Eosinophils Relative: 3 %
Eosinophils Relative: 3 %
HCT: 43.3 % (ref 36.0–46.0)
HCT: 44.9 % (ref 36.0–46.0)
Hemoglobin: 14.4 g/dL (ref 12.0–15.0)
Hemoglobin: 15.3 g/dL — ABNORMAL HIGH (ref 12.0–15.0)
Immature Granulocytes: 0 %
Immature Granulocytes: 0 %
Lymphocytes Relative: 24 %
Lymphocytes Relative: 22 %
Lymphs Abs: 2.3 10*3/uL (ref 0.7–4.0)
Lymphs Abs: 2.3 10*3/uL (ref 0.7–4.0)
MCH: 29.1 pg (ref 26.0–34.0)
MCH: 29.3 pg (ref 26.0–34.0)
MCHC: 33.3 g/dL (ref 30.0–36.0)
MCHC: 34.1 g/dL (ref 30.0–36.0)
MCV: 87.5 fL (ref 80.0–100.0)
MCV: 86 fL (ref 80.0–100.0)
Monocytes Absolute: 0.8 10*3/uL (ref 0.1–1.0)
Monocytes Absolute: 0.8 10*3/uL (ref 0.1–1.0)
Monocytes Relative: 9 %
Monocytes Relative: 7 %
Neutro Abs: 6.1 10*3/uL (ref 1.7–7.7)
Neutro Abs: 6.9 10*3/uL (ref 1.7–7.7)
Neutrophils Relative %: 64 %
Neutrophils Relative %: 67 %
Platelets: 352 10*3/uL (ref 150–400)
Platelets: 371 10*3/uL (ref 150–400)
RBC: 4.95 MIL/uL (ref 3.87–5.11)
RBC: 5.22 MIL/uL — ABNORMAL HIGH (ref 3.87–5.11)
RDW: 14.8 % (ref 11.5–15.5)
RDW: 14.7 % (ref 11.5–15.5)
WBC: 9.6 10*3/uL (ref 4.0–10.5)
WBC: 10.3 10*3/uL (ref 4.0–10.5)
nRBC: 0 % (ref 0.0–0.2)
nRBC: 0 % (ref 0.0–0.2)

## 2021-01-21 LAB — RESP PANEL BY RT-PCR (FLU A&B, COVID) ARPGX2
Influenza A by PCR: NEGATIVE
Influenza A by PCR: NEGATIVE
Influenza B by PCR: NEGATIVE
Influenza B by PCR: NEGATIVE
SARS Coronavirus 2 by RT PCR: NEGATIVE
SARS Coronavirus 2 by RT PCR: NEGATIVE

## 2021-01-21 LAB — BASIC METABOLIC PANEL
Anion gap: 8 (ref 5–15)
BUN: 8 mg/dL (ref 6–20)
CO2: 25 mmol/L (ref 22–32)
Calcium: 9.3 mg/dL (ref 8.9–10.3)
Chloride: 103 mmol/L (ref 98–111)
Creatinine, Ser: 1.01 mg/dL — ABNORMAL HIGH (ref 0.44–1.00)
GFR, Estimated: >60 mL/min (ref >60)
Glucose, Bld: 104 mg/dL — ABNORMAL HIGH (ref 70–99)
Potassium: 6.1 mmol/L — ABNORMAL HIGH (ref 3.5–5.1)
Sodium: 136 mmol/L (ref 135–145)

## 2021-01-21 LAB — POTASSIUM: Potassium: 4.3 mmol/L (ref 3.5–5.1)

## 2021-01-21 MED ORDER — IPRATROPIUM-ALBUTEROL 0.5-2.5 (3) MG/3ML IN SOLN
3.0000 mL | Freq: Four times a day (QID) | RESPIRATORY_TRACT | Status: DC
  Administered 2021-01-22 – 2021-01-24 (×10): 3 mL via RESPIRATORY_TRACT
  Filled 2021-01-21 (×11): qty 3

## 2021-01-21 MED ORDER — METHYLPREDNISOLONE SODIUM SUCC 125 MG IJ SOLR
125.0000 mg | Freq: Once | INTRAMUSCULAR | Status: AC
  Administered 2021-01-21: 125 mg via INTRAVENOUS
  Filled 2021-01-21: qty 2

## 2021-01-21 MED ORDER — ACETAMINOPHEN 650 MG RE SUPP: 650.0000 mg | Freq: Four times a day (QID) | RECTAL | Status: DC | PRN

## 2021-01-21 MED ORDER — SODIUM CHLORIDE 0.9 % IV SOLN: INTRAVENOUS | Status: DC

## 2021-01-21 MED ORDER — ACETAMINOPHEN 325 MG PO TABS
650.0000 mg | ORAL_TABLET | Freq: Four times a day (QID) | ORAL | Status: DC | PRN
  Administered 2021-01-22 – 2021-01-23 (×5): 650 mg via ORAL
  Filled 2021-01-21 (×5): qty 2

## 2021-01-21 MED ORDER — ALBUTEROL SULFATE (2.5 MG/3ML) 0.083% IN NEBU: 2.5000 mg | INHALATION_SOLUTION | RESPIRATORY_TRACT | Status: DC | PRN

## 2021-01-21 MED ORDER — METHYLPREDNISOLONE SODIUM SUCC 125 MG IJ SOLR: 60.0000 mg | Freq: Three times a day (TID) | INTRAMUSCULAR | Status: DC

## 2021-01-21 MED ORDER — IPRATROPIUM BROMIDE HFA 17 MCG/ACT IN AERS
2.0000 | INHALATION_SPRAY | Freq: Once | RESPIRATORY_TRACT | Status: AC
  Administered 2021-01-21: 2 via RESPIRATORY_TRACT
  Filled 2021-01-21: qty 12.9

## 2021-01-21 MED ORDER — ALBUTEROL (5 MG/ML) CONTINUOUS INHALATION SOLN
10.0000 mg/h | INHALATION_SOLUTION | Freq: Once | RESPIRATORY_TRACT | Status: AC
  Administered 2021-01-21: 10 mg/h via RESPIRATORY_TRACT
  Filled 2021-01-21: qty 20

## 2021-01-21 MED ORDER — MONTELUKAST SODIUM 10 MG PO TABS
10.0000 mg | ORAL_TABLET | Freq: Every day | ORAL | Status: DC
  Administered 2021-01-22 – 2021-01-23 (×3): 10 mg via ORAL
  Filled 2021-01-21 (×4): qty 1

## 2021-01-21 MED ORDER — AMOXICILLIN-POT CLAVULANATE 500-125 MG PO TABS: 1.0000 | ORAL_TABLET | Freq: Three times a day (TID) | ORAL | 0 refills | Status: DC

## 2021-01-21 MED ORDER — ALBUTEROL SULFATE HFA 108 (90 BASE) MCG/ACT IN AERS
4.0000 | INHALATION_SPRAY | Freq: Once | RESPIRATORY_TRACT | Status: AC
  Administered 2021-01-21: 4 via RESPIRATORY_TRACT
  Filled 2021-01-21: qty 6.7

## 2021-01-21 MED ORDER — ENOXAPARIN SODIUM 40 MG/0.4ML ~~LOC~~ SOLN
40.0000 mg | SUBCUTANEOUS | Status: DC
  Administered 2021-01-22 – 2021-01-24 (×3): 40 mg via SUBCUTANEOUS
  Filled 2021-01-21 (×3): qty 0.4

## 2021-01-21 MED ORDER — BREO ELLIPTA 100-25 MCG/INH IN AEPB: 1.0000 | INHALATION_SPRAY | Freq: Every day | RESPIRATORY_TRACT | 0 refills | Status: DC
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
## 2021-01-21 NOTE — ED Notes (Signed)
Respiratory called for continuous nebulizer treatment.  

## 2021-01-21 NOTE — ED Triage Notes (Signed)
Pt reports she has been having difficulty with her asthma over the past few days. Pts work of breathing is increased. Pt reports she has been taking prednisone and using inhalers without relief.

## 2021-01-21 NOTE — ED Notes (Signed)
SBAR in chart, vitals up to date. Jabier Mutton, RN messaged and ready for patient.

## 2021-01-21 NOTE — ED Notes (Signed)
Pt reports she does not want to sit out there in the waiting room for so long she is going to go to [employer]

## 2021-01-21 NOTE — Progress Notes (Signed)
    SUBJECTIVE:   CHIEF COMPLAINT / HPI: asthma exacerbation  60 yo woman with h/o severe, persistent asthma presents today with concern for asthma exacerbation. She received prednisone on 01/09/21 for increased wheezing and finished the course on 01/14/21. Since that time she has had increased wheezing, coughing, waking up at night SOB x>5 nights per week. Has trouble walking due to wheezing. She has an albulterol nebulizer and MDI with spacer at home, demonstrates appropriate use of MDI. She used her nebulizer twice today, last time about 2 pm. She reports that she does immediately feel better after nebulizer, but then about 1.5-2 hours later, the symptoms return. She has no h/o COPD (never diagnosed) per patient (chart review shows paraseptal emphysema by CT chest in February 2021), no PFTs that I can find in chart, no h/o smoking. She used to use dulera, but this fell off her formulary and she had to switch to Trelegy due to cost. She initially received samples of this. She has since run out of samples and reports she cannot afford controller medication. She has been intubated/hospitalized many times.   PERTINENT  PMH / PSH: severe, persistent asthma  OBJECTIVE:   BP 130/60   Pulse 100   Temp 98 F (36.7 C)   SpO2 94%   Nursing note and vitals reviewed GEN: ill appearing AA woman, tripoding, looks uncomfortable, moderate distress, WNWD HEENT: NCAT. PERRLA. Sclera without injection or icterus. MMM.  Neck: Supple. No LAD Cardiac: Regular rate and rhythm. Normal S1/S2. No murmurs, rubs, or gallops appreciated. 2+ radial pulses. Lungs: Diffuse inspiratory and expiratory wheezing in all lung fields, accessory muscle usage. Able to speak in full sentences. Neuro: Alert and at baseline Ext: no edema Psych: Pleasant and appropriate  ASSESSMENT/PLAN:   Asthma exacerbation Patient with acute asthma exacerbation in setting of not being able to afford controller medications. Patient given  albuterol nebulizer in office. Reports feeling better, maintained oxygenation >90% SpO2. However, she is unable to ambulate even after nebulizer. Due to severe symptoms and failure to improve adequately with neb trial, recommend patient go to ED for work up and stabilization. Patient needs PFTs and close follow up when discharged. Given Breo sample (trelegy sample not available), but patient forgot in room. Will leave at front for patient to pick up when able. Discussed with Dr. Manson Passey.     Shirlean Mylar, MD [facility]

## 2021-01-21 NOTE — ED Notes (Signed)
Amy, Respiratory Therapist, said that patient will need to finish the continuous nebulizer in the ED before she can go upstairs.
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
## 2021-01-21 NOTE — ED Notes (Signed)
Patient given some coffee. Dr. Freida Busman said that patient is allowed to drink.

## 2021-01-21 NOTE — ED Triage Notes (Signed)
Pt reports was at work today and states Asthma just had progressively gotten worse and states use her albuterol inhaler with no relief and then went to her PCP office where they gave her 1 breathing treatment and told pt to come to the ED for further evaluation

## 2021-01-21 NOTE — ED Provider Notes (Cosign Needed)
MSE was initiated and I personally evaluated the patient and placed orders (if any) at  5:37 PM on January 21, 2021.  The patient appears stable so that the remainder of the MSE may be completed by another provider.  Patient presents today for asthma exacerbation.  She just used three puffs prior to my evaluation and I asked her to take an additional three puffs of her home inhaler for a total of 6.    She states that finished a steroid dose and then got worse yesterday.   She has had to be intubated for her asthma before.   Patient has had two vaccines, has not been around anyone sick, but works in [employer].    Patient has diffuse bilateral wheezes.     Charge made aware that patient needs a room.    Initiation of care has begun. The patient has been counseled on the process, plan, and necessity for staying for the completion/evaluation, and the remainder of the medical screening examination.         Cristina Gong, [place] 01/21/21 1741

## 2021-01-21 NOTE — ED Notes (Signed)
Jabier Mutton, RN for [phone], notified and aware that patient is getting continuous nebulizer before she can go upstairs for admission.

## 2021-01-21 NOTE — Telephone Encounter (Signed)
AFTER HOURS CALL   Received call from patient stating that she was in the [facility] ED waiting to be roomed. She reported that she was seen in the [facility] and recommended to be evaluated in the ED due to an asthma exacerbation.  Patient states that she would like to go to [facility] ED due to the 3 hour wait to be roomed. Patient reported she had been advised to use her personal inhaler and she was not happy with treatment course nor environment of the [facility].  Discussed case with Dr. Miquel Dunn and advised patient to remain in [facility] ED for further evaluation and to be treated immediately if her condition were to worsen. Patient reported that she would prefer to go to [facility] ED due to less wait time and she would have her friend who is a [employer] fundraiser to transport her.

## 2021-01-21 NOTE — Assessment & Plan Note (Signed)
Patient with acute asthma exacerbation in setting of not being able to afford controller medications. Patient given albuterol nebulizer in office. Reports feeling better, maintained oxygenation >90% SpO2. However, she is unable to ambulate even after nebulizer. Due to severe symptoms and failure to improve adequately with neb trial, recommend patient go to ED for work up and stabilization. Patient needs PFTs and close follow up when discharged. Given Breo sample (trelegy sample not available), but patient forgot in room. Will leave at front for patient to pick up when able. Discussed with Dr. Manson Passey.

## 2021-01-22 DIAGNOSIS — J9601 Acute respiratory failure with hypoxia: Secondary | ICD-10-CM

## 2021-01-22 DIAGNOSIS — J441 Chronic obstructive pulmonary disease with (acute) exacerbation: Secondary | ICD-10-CM

## 2021-01-22 DIAGNOSIS — A419 Sepsis, unspecified organism: Principal | ICD-10-CM

## 2021-01-22 DIAGNOSIS — J438 Other emphysema: Secondary | ICD-10-CM

## 2021-01-22 DIAGNOSIS — R6520 Severe sepsis without septic shock: Secondary | ICD-10-CM

## 2021-01-22 DIAGNOSIS — J449 Chronic obstructive pulmonary disease, unspecified: Secondary | ICD-10-CM | POA: Diagnosis present

## 2021-01-22 HISTORY — DX: Severe sepsis without septic shock: R65.20

## 2021-01-22 HISTORY — DX: Sepsis, unspecified organism: A41.9

## 2021-01-22 LAB — PROCALCITONIN: Procalcitonin: <0.1 ng/mL

## 2021-01-22 LAB — EXPECTORATED SPUTUM ASSESSMENT W GRAM STAIN, RFLX TO RESP C

## 2021-01-22 LAB — RESPIRATORY PANEL BY PCR

## 2021-01-22 LAB — BASIC METABOLIC PANEL
Anion gap: 9 (ref 5–15)
BUN: 11 mg/dL (ref 6–20)
CO2: 22 mmol/L (ref 22–32)
Calcium: 9.6 mg/dL (ref 8.9–10.3)
Chloride: 103 mmol/L (ref 98–111)
Creatinine, Ser: 0.8 mg/dL (ref 0.44–1.00)
GFR, Estimated: >60 mL/min (ref >60)
Glucose, Bld: 158 mg/dL — ABNORMAL HIGH (ref 70–99)
Potassium: 4.3 mmol/L (ref 3.5–5.1)
Sodium: 134 mmol/L — ABNORMAL LOW (ref 135–145)

## 2021-01-22 LAB — PHOSPHORUS: Phosphorus: 1.3 mg/dL — ABNORMAL LOW (ref 2.5–4.6)

## 2021-01-22 LAB — LACTIC ACID, PLASMA
Lactic Acid, Venous: 2.1 mmol/L (ref 0.5–1.9)
Lactic Acid, Venous: 1.2 mmol/L (ref 0.5–1.9)

## 2021-01-22 LAB — MAGNESIUM: Magnesium: 2.4 mg/dL (ref 1.7–2.4)

## 2021-01-22 MED ORDER — METHYLPREDNISOLONE SODIUM SUCC 125 MG IJ SOLR
60.0000 mg | Freq: Four times a day (QID) | INTRAMUSCULAR | Status: DC
  Administered 2021-01-22 – 2021-01-23 (×6): 60 mg via INTRAVENOUS
  Filled 2021-01-22 (×7): qty 2

## 2021-01-22 MED ORDER — KETOROLAC TROMETHAMINE 15 MG/ML IJ SOLN
15.0000 mg | Freq: Once | INTRAMUSCULAR | Status: AC
  Administered 2021-01-22: 15 mg via INTRAVENOUS
  Filled 2021-01-22: qty 1

## 2021-01-22 MED ORDER — SODIUM CHLORIDE 0.9 % IV SOLN: INTRAVENOUS | Status: DC

## 2021-01-22 MED ORDER — SODIUM CHLORIDE 0.9 % IV SOLN
2.0000 g | INTRAVENOUS | Status: DC
  Administered 2021-01-22 – 2021-01-23 (×2): 2 g via INTRAVENOUS
  Filled 2021-01-22: qty 20
  Filled 2021-01-22: qty 2
  Filled 2021-01-22: qty 20

## 2021-01-22 MED ORDER — MAGNESIUM SULFATE 2 GM/50ML IV SOLN
2.0000 g | Freq: Once | INTRAVENOUS | Status: AC
  Administered 2021-01-22: 2 g via INTRAVENOUS
  Filled 2021-01-22: qty 50

## 2021-01-22 MED ORDER — FLUTICASONE-UMECLIDIN-VILANT 200-62.5-25 MCG/INH IN AEPB: 1.0000 | INHALATION_SPRAY | Freq: Every day | RESPIRATORY_TRACT | Status: DC

## 2021-01-22 MED ORDER — SODIUM CHLORIDE 0.9 % IV SOLN
500.0000 mg | INTRAVENOUS | Status: DC
  Administered 2021-01-22 – 2021-01-23 (×2): 500 mg via INTRAVENOUS
  Filled 2021-01-22 (×3): qty 500

## 2021-01-22 MED ORDER — POTASSIUM PHOSPHATES 15 MMOLE/5ML IV SOLN
30.0000 mmol | Freq: Once | INTRAVENOUS | Status: AC
  Administered 2021-01-22: 30 mmol via INTRAVENOUS
  Filled 2021-01-22: qty 10

## 2021-01-22 NOTE — Plan of Care (Signed)
Initiated care plan 

## 2021-01-22 NOTE — Progress Notes (Signed)
PROGRESS NOTE    Lydia Griffin  ZDG:[id number] DOB: 1961-05-23 DOA: 01/21/2021 PCP: Cora Collum, DO     Brief Narrative:  Lydia Griffin is a 60 y.o. BF PMHx severe persistent asthma with multiple prior hospitalizations/intubation, depression.   She finished a 5-day course of prednisone at the end of March for asthma exacerbation, however, continued to be symptomatic due to not being able to afford controller medications and was seen by her PCP today for asthma exacerbation.  She was given albuterol nebulizer treatment in the office and maintained oxygen saturation above 90%, however, continued to have dyspnea with ambulation.  Sent to the ED for further management.  In the ED, wheezing and in respiratory distress.  She was given IV Solu-Medrol 125 mg, albuterol-ipratropium inhaler treatment, and subsequently continuous albuterol neb treatment.  Potassium was high on initial labs but normal on repeat.  Covid and influenza PCR negative. Chest x-ray showing no active disease.  Patient states she was previously on [facility] for a long time but recently switched to Trelegy as she started having asthma attacks.  She also uses an albuterol inhaler and DuoNeb nebulizer at home.  States Trelegy works well for her but she has not been able to afford it.  Her PCP has been giving her samples but 2 weeks ago she ran out completely and since then having increasing shortness of breath and wheezing.  She is not coughing much.  Denies fevers.  States her PCP gave her a course of steroid last week which helped but her breathing became worse after she finished the steroid course.  She is vaccinated against COVID.  States she has never smoked cigarettes but previously lived with her daughter for 5 years who was a smoker.   Subjective: A/O x4, states her asthma is triggered by springtime weather (rain and pollen), as well as cold weather.  States has never had full spirometry, but has had PFTs many years ago.   Years ago have been seen by [facility].  States he had increasing respiratory issues starting November 2019---> March 2020.  Her PCP will place her on oral steroids which would help until she discontinued steroids.  Last dose was a week ago prednisone 40 mg x 5 days.   Assessment & Plan: Covid vaccination; vaccinated 2/3   Principal Problem:   Acute asthma exacerbation Active Problems:   Acute hypoxemic respiratory failure (HCC)   Severe sepsis (HCC)   COPD with acute exacerbation (HCC)   Severe sepsis -On admission patient meets criteria for severe sepsis HR> 90, RR> 20, lactic acid> 2 site of infection most likely lungs -4/2 normal saline 163ml/hr  Acute respiratory failure with hypoxia/acute exacerbation severe persistent asthma -Unable to afford medication -4/2 consult LCSW for help with medication -Albuterol nebulizer PRN -DuoNeb QID -Solu-Medrol 60 mg QID -Singular 10 mg daily -Flutter valve -Incentive spirometry -Patient with productive cough (green) we will treat for CAP -Titrate O2 to maintain SPO2> 88%   Emphysema/COPD exacerbation -See acute respiratory failure -If patient's symptoms do not improve will consider obtaining CT chest.  Last CT chest in 2021 abnormal   Hypophosphatemia* -Phosphorus goal> 2.5 -K-Phos IV 30 mmol  Refractory headache/backache -Toradol IV 50 mg x 1    DVT prophylaxis: Lovenox Code Status: Full Family Communication:  Status is: Inpatient    Dispo: The patient is from: Home              Anticipated d/c is to: Home  Anticipated d/c date is: 4/9              Patient currently unstable      Consultants:    Procedures/Significant Events:    I have personally reviewed and interpreted all radiology studies and my findings are as above.  VENTILATOR SETTINGS:    Cultures 4/2 sputum pending  Antimicrobials: Anti-infectives (From admission, onward)   Start     Dose/Rate Route Frequency Ordered  Stop   01/22/21 1500  azithromycin (ZITHROMAX) 500 mg in sodium chloride 0.9 % 250 mL IVPB        500 mg 250 mL/hr over 60 Minutes Intravenous Every 24 hours 01/22/21 1418     01/22/21 1500  cefTRIAXone (ROCEPHIN) 2 g in sodium chloride 0.9 % 100 mL IVPB        2 g 200 mL/hr over 30 Minutes Intravenous Every 24 hours 01/22/21 1418         Devices    LINES / TUBES:      Continuous Infusions: . sodium chloride Stopped (01/22/21 1114)  . sodium chloride 100 mL/hr at 01/22/21 1800  . azithromycin Stopped (01/22/21 1721)  . cefTRIAXone (ROCEPHIN)  IV Stopped (01/22/21 1754)     Objective: Vitals:   01/22/21 0739 01/22/21 0820 01/22/21 1142 01/22/21 1331  BP:  (!) 157/74 (!) 158/88   Pulse:  86 92   Resp:  (!) 25 (!) 23   Temp:  97.9 F (36.6 C) 97.9 F (36.6 C)   TempSrc:  Oral    SpO2: 96% 98% 98% 93%  Weight:      Height:        Intake/Output Summary (Last 24 hours) at 01/22/2021 1910 Last data filed at 01/22/2021 1800 Gross per 24 hour  Intake 1688.93 ml  Output --  Net 1688.93 ml   Filed Weights   01/22/21 0045  Weight: 73.5 kg    Examination:  General: A/O x4, positive acute respiratory distress Eyes: negative scleral hemorrhage, negative anisocoria, negative icterus ENT: Negative Runny nose, negative gingival bleeding, Neck:  Negative scars, masses, torticollis, lymphadenopathy, JVD Lungs: diffuse decreased breath sounds bilaterally, severe expiratory wheezes, negative crackles Cardiovascular: Regular rate and rhythm without murmur gallop or rub normal S1 and S2 Abdomen: negative abdominal pain, nondistended, positive soft, bowel sounds, no rebound, no ascites, no appreciable mass Extremities: No significant cyanosis, clubbing, or edema bilateral lower extremities Skin: Negative rashes, lesions, ulcers Psychiatric:  Negative depression, negative anxiety, negative fatigue, negative mania  Central nervous system:  Cranial nerves II through XII intact,  tongue/uvula midline, all extremities muscle strength 5/5, sensation intact throughout, negative dysarthria, negative expressive aphasia, negative receptive aphasia.  .     Data Reviewed: Care during the described time interval was provided by me .  I have reviewed this patient's available data, including medical history, events of note, physical examination, and all test results as part of my evaluation.  CBC: Recent Labs  Lab 01/21/21 1759 01/21/21 2001  WBC 9.6 10.3  NEUTROABS 6.1 6.9  HGB 14.4 15.3*  HCT 43.3 44.9  MCV 87.5 86.0  PLT 352 371   Basic Metabolic Panel: Recent Labs  Lab 01/21/21 1759 01/21/21 2001 01/21/21 2140 01/22/21 0504  NA 135 136  --  134*  K 3.9 6.1* 4.3 4.3  CL 104 103  --  103  CO2 22 25  --  22  GLUCOSE 102* 104*  --  158*  BUN 7 8  --  11  CREATININE 0.69  1.01*  --  0.80  CALCIUM 9.4 9.3  --  9.6  MG  --   --   --  2.4  PHOS  --   --   --  1.3*   GFR: Estimated Creatinine Clearance: 76.7 mL/min (by C-G formula based on SCr of 0.8 mg/dL). Liver Function Tests: Recent Labs  Lab 01/21/21 1759  AST 24  ALT 28  ALKPHOS 110  BILITOT 0.7  PROT 7.2  ALBUMIN 3.9   No results for input(s): LIPASE, AMYLASE in the last 168 hours. No results for input(s): AMMONIA in the last 168 hours. Coagulation Profile: No results for input(s): INR, PROTIME in the last 168 hours. Cardiac Enzymes: No results for input(s): CKTOTAL, CKMB, CKMBINDEX, TROPONINI in the last 168 hours. BNP (last 3 results) No results for input(s): PROBNP in the last 8760 hours. HbA1C: No results for input(s): HGBA1C in the last 72 hours. CBG: No results for input(s): GLUCAP in the last 168 hours. Lipid Profile: No results for input(s): CHOL, HDL, LDLCALC, TRIG, CHOLHDL, LDLDIRECT in the last 72 hours. Thyroid Function Tests: No results for input(s): TSH, T4TOTAL, FREET4, T3FREE, THYROIDAB in the last 72 hours. Anemia Panel: No results for input(s): VITAMINB12, FOLATE,  FERRITIN, TIBC, IRON, RETICCTPCT in the last 72 hours. Sepsis Labs: Recent Labs  Lab 01/22/21 1511 01/22/21 1750  PROCALCITON <0.10  --   LATICACIDVEN 2.1* 1.2    Recent Results (from the past 240 hour(s))  Resp Panel by RT-PCR (Flu A&B, Covid) Nasopharyngeal Swab     Status: None   Collection Time: 01/21/21  5:42 PM   Specimen: Nasopharyngeal Swab; Nasopharyngeal(NP) swabs in vial transport medium  Result Value Ref Range Status   SARS Coronavirus 2 by RT PCR NEGATIVE NEGATIVE Final    Comment: (NOTE) SARS-CoV-2 target nucleic acids are NOT DETECTED.  The SARS-CoV-2 RNA is generally detectable in upper respiratory specimens during the acute phase of infection. The lowest concentration of SARS-CoV-2 viral copies this assay can detect is 138 copies/mL. A negative result does not preclude SARS-Cov-2 infection and should not be used as the sole basis for treatment or other patient management decisions. A negative result may occur with  improper specimen collection/handling, submission of specimen other than nasopharyngeal swab, presence of viral mutation(s) within the areas targeted by this assay, and inadequate number of viral copies(<138 copies/mL). A negative result must be combined with clinical observations, patient history, and epidemiological information. The expected result is Negative.  Fact Sheet for Patients:  BloggerCourse.com  Fact Sheet for Healthcare Providers:  SeriousBroker.it  This test is no t yet approved or cleared by the [place] FDA and  has been authorized for detection and/or diagnosis of SARS-CoV-2 by FDA under an Emergency Use Authorization (EUA). This EUA will remain  in effect (meaning this test can be used) for the duration of the COVID-19 declaration under Section 564(b)(1) of the Act, 21 U.S.C.section 360bbb-3(b)(1), unless the authorization is terminated  or revoked sooner.        Influenza A by PCR NEGATIVE NEGATIVE Final   Influenza B by PCR NEGATIVE NEGATIVE Final    Comment: (NOTE) The Xpert Xpress SARS-CoV-2/FLU/RSV plus assay is intended as an aid in the diagnosis of influenza from Nasopharyngeal swab specimens and should not be used as a sole basis for treatment. Nasal washings and aspirates are unacceptable for Xpert Xpress SARS-CoV-2/FLU/RSV testing.  Fact Sheet for Patients: BloggerCourse.com  Fact Sheet for Healthcare Providers: SeriousBroker.it  This test is not yet approved  or cleared by the [place] and has been authorized for detection and/or diagnosis of SARS-CoV-2 by FDA under an Emergency Use Authorization (EUA). This EUA will remain in effect (meaning this test can be used) for the duration of the COVID-19 declaration under Section 564(b)(1) of the Act, 21 U.S.C. section 360bbb-3(b)(1), unless the authorization is terminated or revoked.  Performed at [facility], [address] [place], [place] [address]   Resp Panel by RT-PCR (Flu A&B, Covid) Nasopharyngeal Swab     Status: None   Collection Time: 01/21/21  8:01 PM   Specimen: Nasopharyngeal Swab; Nasopharyngeal(NP) swabs in vial transport medium  Result Value Ref Range Status   SARS Coronavirus 2 by RT PCR NEGATIVE NEGATIVE Final    Comment: (NOTE) SARS-CoV-2 target nucleic acids are NOT DETECTED.  The SARS-CoV-2 RNA is generally detectable in upper respiratory specimens during the acute phase of infection. The lowest concentration of SARS-CoV-2 viral copies this assay can detect is 138 copies/mL. A negative result does not preclude SARS-Cov-2 infection and should not be used as the sole basis for treatment or other patient management decisions. A negative result may occur with  improper specimen collection/handling, submission of specimen other than nasopharyngeal swab, presence of viral mutation(s) within  the areas targeted by this assay, and inadequate number of viral copies(<138 copies/mL). A negative result must be combined with clinical observations, patient history, and epidemiological information. The expected result is Negative.  Fact Sheet for Patients:  BloggerCourse.com  Fact Sheet for Healthcare Providers:  SeriousBroker.it  This test is no t yet approved or cleared by the [place] FDA and  has been authorized for detection and/or diagnosis of SARS-CoV-2 by FDA under an Emergency Use Authorization (EUA). This EUA will remain  in effect (meaning this test can be used) for the duration of the COVID-19 declaration under Section 564(b)(1) of the Act, 21 U.S.C.section 360bbb-3(b)(1), unless the authorization is terminated  or revoked sooner.       Influenza A by PCR NEGATIVE NEGATIVE Final   Influenza B by PCR NEGATIVE NEGATIVE Final    Comment: (NOTE) The Xpert Xpress SARS-CoV-2/FLU/RSV plus assay is intended as an aid in the diagnosis of influenza from Nasopharyngeal swab specimens and should not be used as a sole basis for treatment. Nasal washings and aspirates are unacceptable for Xpert Xpress SARS-CoV-2/FLU/RSV testing.  Fact Sheet for Patients: BloggerCourse.com  Fact Sheet for Healthcare Providers: SeriousBroker.it  This test is not yet approved or cleared by the [place] FDA and has been authorized for detection and/or diagnosis of SARS-CoV-2 by FDA under an Emergency Use Authorization (EUA). This EUA will remain in effect (meaning this test can be used) for the duration of the COVID-19 declaration under Section 564(b)(1) of the Act, 21 U.S.C. section 360bbb-3(b)(1), unless the authorization is terminated or revoked.  Performed at [facility], [address] [place], [place] [address]   Respiratory (~20 pathogens) panel by PCR      Status: None   Collection Time: 01/22/21  9:04 AM   Specimen: Nasopharyngeal Swab; Respiratory  Result Value Ref Range Status   Adenovirus NOT DETECTED NOT DETECTED Final   Coronavirus 229E NOT DETECTED NOT DETECTED Final    Comment: (NOTE) The Coronavirus on the Respiratory Panel, DOES NOT test for the novel  Coronavirus (2019 nCoV)    Coronavirus HKU1 NOT DETECTED NOT DETECTED Final   Coronavirus NL63 NOT DETECTED NOT DETECTED Final   Coronavirus OC43 NOT DETECTED NOT  DETECTED Final   Metapneumovirus NOT DETECTED NOT DETECTED Final   Rhinovirus / Enterovirus NOT DETECTED NOT DETECTED Final   Influenza A NOT DETECTED NOT DETECTED Final   Influenza B NOT DETECTED NOT DETECTED Final   Parainfluenza Virus 1 NOT DETECTED NOT DETECTED Final   Parainfluenza Virus 2 NOT DETECTED NOT DETECTED Final   Parainfluenza Virus 3 NOT DETECTED NOT DETECTED Final   Parainfluenza Virus 4 NOT DETECTED NOT DETECTED Final   Respiratory Syncytial Virus NOT DETECTED NOT DETECTED Final   Bordetella pertussis NOT DETECTED NOT DETECTED Final   Bordetella Parapertussis NOT DETECTED NOT DETECTED Final   Chlamydophila pneumoniae NOT DETECTED NOT DETECTED Final   Mycoplasma pneumoniae NOT DETECTED NOT DETECTED Final    Comment: Performed at [facility], [address], [place], [place] [address]  Expectorated Sputum Assessment w Gram Stain, Rflx to Resp Cult     Status: None   Collection Time: 01/22/21  4:25 PM   Specimen: Sputum  Result Value Ref Range Status   Specimen Description SPUTUM EXPECTORATED  Final   Special Requests NONE  Final   Sputum evaluation   Final    THIS SPECIMEN IS ACCEPTABLE FOR SPUTUM CULTURE Performed at [facility], [address] [place], [place] [address]    Report Status 01/22/2021 FINAL  Final         Radiology Studies: DG Chest Port 1 View  Result Date: 01/21/2021 CLINICAL DATA:  Shortness of breath EXAM: PORTABLE CHEST 1 VIEW COMPARISON:   02/23/2020 FINDINGS: Heart and mediastinal contours are within normal limits. No focal opacities or effusions. No acute bony abnormality. IMPRESSION: No active disease. Electronically Signed   By: Charlett Nose M.D.   On: 01/21/2021 20:08        Scheduled Meds: . enoxaparin (LOVENOX) injection  40 mg Subcutaneous Q24H  . ipratropium-albuterol  3 mL Nebulization Q6H  . methylPREDNISolone (SOLU-MEDROL) injection  60 mg Intravenous Q6H  . montelukast  10 mg Oral QHS   Continuous Infusions: . sodium chloride Stopped (01/22/21 1114)  . sodium chloride 100 mL/hr at 01/22/21 1800  . azithromycin Stopped (01/22/21 1721)  . cefTRIAXone (ROCEPHIN)  IV Stopped (01/22/21 1754)     LOS: 1 day    Time spent:40 min    Yarieliz Wasser, Roselind Messier, MD [facility]   If 7PM-7AM, please contact night-coverage 01/22/2021, 7:10 PM

## 2021-01-22 NOTE — Progress Notes (Addendum)
Resumed care for this pt at 1100. I agree with previous RN's assessment. Pt currently resting in bed. No complaints at this time. Will continue plan of care.

## 2021-01-23 ENCOUNTER — Inpatient Hospital Stay (HOSPITAL_COMMUNITY): Payer: Self-pay

## 2021-01-23 LAB — CBC WITH DIFFERENTIAL/PLATELET
Abs Immature Granulocytes: 0.29 10*3/uL — ABNORMAL HIGH (ref 0.00–0.07)
Basophils Absolute: 0 10*3/uL (ref 0.0–0.1)
Basophils Relative: 0 %
Eosinophils Absolute: 0 10*3/uL (ref 0.0–0.5)
Eosinophils Relative: 0 %
HCT: 37 % (ref 36.0–46.0)
Hemoglobin: 12.5 g/dL (ref 12.0–15.0)
Immature Granulocytes: 1 %
Lymphocytes Relative: 3 %
Lymphs Abs: 0.8 10*3/uL (ref 0.7–4.0)
MCH: 28.9 pg (ref 26.0–34.0)
MCHC: 33.8 g/dL (ref 30.0–36.0)
MCV: 85.6 fL (ref 80.0–100.0)
Monocytes Absolute: 0.7 10*3/uL (ref 0.1–1.0)
Monocytes Relative: 3 %
Neutro Abs: 24 10*3/uL — ABNORMAL HIGH (ref 1.7–7.7)
Neutrophils Relative %: 93 %
Platelets: 321 10*3/uL (ref 150–400)
RBC: 4.32 MIL/uL (ref 3.87–5.11)
RDW: 14.9 % (ref 11.5–15.5)
WBC: 25.9 10*3/uL — ABNORMAL HIGH (ref 4.0–10.5)
nRBC: 0 % (ref 0.0–0.2)

## 2021-01-23 LAB — PROCALCITONIN: Procalcitonin: <0.1 ng/mL

## 2021-01-23 LAB — COMPREHENSIVE METABOLIC PANEL
ALT: 28 U/L (ref 0–44)
AST: 27 U/L (ref 15–41)
Albumin: 3.5 g/dL (ref 3.5–5.0)
Alkaline Phosphatase: 98 U/L (ref 38–126)
Anion gap: 10 (ref 5–15)
BUN: 13 mg/dL (ref 6–20)
CO2: 21 mmol/L — ABNORMAL LOW (ref 22–32)
Calcium: 9 mg/dL (ref 8.9–10.3)
Chloride: 104 mmol/L (ref 98–111)
Creatinine, Ser: 0.7 mg/dL (ref 0.44–1.00)
GFR, Estimated: >60 mL/min (ref >60)
Glucose, Bld: 137 mg/dL — ABNORMAL HIGH (ref 70–99)
Potassium: 4.5 mmol/L (ref 3.5–5.1)
Sodium: 135 mmol/L (ref 135–145)
Total Bilirubin: 0.3 mg/dL (ref 0.3–1.2)
Total Protein: 6.6 g/dL (ref 6.5–8.1)

## 2021-01-23 LAB — MAGNESIUM: Magnesium: 2.1 mg/dL (ref 1.7–2.4)

## 2021-01-23 LAB — PHOSPHORUS: Phosphorus: 3 mg/dL (ref 2.5–4.6)

## 2021-01-23 MED ORDER — TRAZODONE HCL 50 MG PO TABS
50.0000 mg | ORAL_TABLET | Freq: Every evening | ORAL | Status: DC | PRN
  Administered 2021-01-23: 50 mg via ORAL
  Filled 2021-01-23: qty 1

## 2021-01-23 MED ORDER — ONDANSETRON HCL 4 MG PO TABS
4.0000 mg | ORAL_TABLET | Freq: Three times a day (TID) | ORAL | Status: DC | PRN
  Administered 2021-01-23: 4 mg via ORAL
  Filled 2021-01-23: qty 1

## 2021-01-23 MED ORDER — SENNA 8.6 MG PO TABS
1.0000 | ORAL_TABLET | Freq: Two times a day (BID) | ORAL | Status: DC | PRN
  Administered 2021-01-23: 8.6 mg via ORAL
  Filled 2021-01-23: qty 1

## 2021-01-23 MED ORDER — SORBITOL 70 % SOLN
960.0000 mL | TOPICAL_OIL | Freq: Once | ORAL | Status: DC
  Filled 2021-01-23: qty 473

## 2021-01-23 MED ORDER — SUMATRIPTAN SUCCINATE 6 MG/0.5ML ~~LOC~~ SOLN
6.0000 mg | Freq: Once | SUBCUTANEOUS | Status: AC
  Administered 2021-01-23: 6 mg via SUBCUTANEOUS
  Filled 2021-01-23: qty 0.5

## 2021-01-23 MED ORDER — METHYLPREDNISOLONE SODIUM SUCC 125 MG IJ SOLR
60.0000 mg | Freq: Two times a day (BID) | INTRAMUSCULAR | Status: DC
  Administered 2021-01-23 – 2021-01-24 (×2): 60 mg via INTRAVENOUS
  Filled 2021-01-23 (×2): qty 2

## 2021-01-23 NOTE — Progress Notes (Signed)
PROGRESS NOTE    [name]  TFT:[id number] DOB: Mar 21, 1961 DOA: 01/21/2021 PCP: Cora Collum, DO     Brief Narrative:  Lydia Griffin is a 60 y.o. BF PMHx severe persistent asthma with multiple prior hospitalizations/intubation, depression.   She finished a 5-day course of prednisone at the end of March for asthma exacerbation, however, continued to be symptomatic due to not being able to afford controller medications and was seen by her PCP today for asthma exacerbation.  She was given albuterol nebulizer treatment in the office and maintained oxygen saturation above 90%, however, continued to have dyspnea with ambulation.  Sent to the ED for further management.  In the ED, wheezing and in respiratory distress.  She was given IV Solu-Medrol 125 mg, albuterol-ipratropium inhaler treatment, and subsequently continuous albuterol neb treatment.  Potassium was high on initial labs but normal on repeat.  Covid and influenza PCR negative. Chest x-ray showing no active disease.  Patient states she was previously on [facility] for a long time but recently switched to Trelegy as she started having asthma attacks.  She also uses an albuterol inhaler and DuoNeb nebulizer at home.  States Trelegy works well for her but she has not been able to afford it.  Her PCP has been giving her samples but 2 weeks ago she ran out completely and since then having increasing shortness of breath and wheezing.  She is not coughing much.  Denies fevers.  States her PCP gave her a course of steroid last week which helped but her breathing became worse after she finished the steroid course.  She is vaccinated against COVID.  States she has never smoked cigarettes but previously lived with her daughter for 5 years who was a smoker.   Subjective: 4/3 A/O x4, states feels slightly better however still positive DOE while on 2 L O2 via [place]     Assessment & Plan: Covid vaccination; vaccinated 2/3   Principal Problem:    Acute asthma exacerbation Active Problems:   Acute hypoxemic respiratory failure (HCC)   Severe sepsis (HCC)   COPD with acute exacerbation (HCC)   Severe sepsis -On admission patient meets criteria for severe sepsis HR> 90, RR> 20, lactic acid> 2 site of infection most likely lungs -4/2 normal saline 122ml/hr  Acute respiratory failure with hypoxia/acute exacerbation severe persistent asthma -Unable to afford medication -4/2 consult LCSW for help with medication -Albuterol nebulizer PRN -DuoNeb QID -4/3 decrease Solu-Medrol 60 mg BID -Singular 10 mg daily -Flutter valve -Incentive spirometry -Patient with productive cough (green) we will treat for CAP -Titrate O2 to maintain SPO2> 88%   Emphysema/COPD exacerbation/ILD? -See acute respiratory failure -If patient's symptoms do not improve will consider obtaining CT chest.  Last CT chest in 2021 abnormal -4/3 HRCT pending if findings consistent with ILD will need to begin work-up for underlying cause, may want to involve PCCM at that point. -6-8 weeks post discharge will need establish care PCCM for spirometry, DLCO, pre-/postbronchodilator   Hypophosphatemia -Phosphorus goal> 2.5  Refractory headache/backache -Toradol IV 50 mg x 1 -Imitrex 6 mg subcu x1    DVT prophylaxis: Lovenox Code Status: Full Family Communication:  Status is: Inpatient    Dispo: The patient is from: Home              Anticipated d/c is to: Home              Anticipated d/c date is: 4/9  Patient currently unstable      Consultants:    Procedures/Significant Events:    I have personally reviewed and interpreted all radiology studies and my findings are as above.  VENTILATOR SETTINGS:    Cultures 4/2 respiratory virus panel negative 4/2 sputum pending    Antimicrobials: Anti-infectives (From admission, onward)   Start     Ordered Stop   01/22/21 1500  azithromycin (ZITHROMAX) 500 mg in sodium chloride 0.9 %  250 mL IVPB        01/22/21 1418     01/22/21 1500  cefTRIAXone (ROCEPHIN) 2 g in sodium chloride 0.9 % 100 mL IVPB        01/22/21 1418         Devices    LINES / TUBES:      Continuous Infusions: . sodium chloride Stopped (01/22/21 1114)  . sodium chloride 100 mL/hr at 01/23/21 0524  . azithromycin Stopped (01/22/21 1721)  . cefTRIAXone (ROCEPHIN)  IV Stopped (01/22/21 1754)     Objective: Vitals:   01/22/21 2037 01/23/21 0206 01/23/21 0612 01/23/21 0805  BP: (!) 141/86  136/80   Pulse: 98  80   Resp: 18  18   Temp: 97.8 F (36.6 C)  97.8 F (36.6 C)   TempSrc: Oral  Oral   SpO2: 99% 97% 100% 96%  Weight:      Height:        Intake/Output Summary (Last 24 hours) at 01/23/2021 1340 Last data filed at 01/23/2021 0300 Gross per 24 hour  Intake 1341.29 ml  Output --  Net 1341.29 ml   Filed Weights   01/22/21 0045  Weight: 73.5 kg   Physical Exam:  General: A/O x4, positive acute respiratory distress Eyes: negative scleral hemorrhage, negative anisocoria, negative icterus ENT: Negative Runny nose, negative gingival bleeding, Neck:  Negative scars, masses, torticollis, lymphadenopathy, JVD Lungs: decreased breath sounds bilaterally, positive moderate wheezes, negative crackles Cardiovascular: Regular rate and rhythm without murmur gallop or rub normal S1 and S2 Abdomen: negative abdominal pain, nondistended, positive soft, bowel sounds, no rebound, no ascites, no appreciable mass Extremities: No significant cyanosis, clubbing, or edema bilateral lower extremities Skin: Negative rashes, lesions, ulcers Psychiatric:  Negative depression, negative anxiety, negative fatigue, negative mania  Central nervous system:  Cranial nerves II through XII intact, tongue/uvula midline, all extremities muscle strength 5/5, sensation intact throughout, negative dysarthria, negative expressive aphasia, negative receptive aphasia.  .     Data Reviewed: Care during the  described time interval was provided by me .  I have reviewed this patient's available data, including medical history, events of note, physical examination, and all test results as part of my evaluation.  CBC: Recent Labs  Lab 01/21/21 1759 01/21/21 2001 01/23/21 0424  WBC 9.6 10.3 25.9*  NEUTROABS 6.1 6.9 24.0*  HGB 14.4 15.3* 12.5  HCT 43.3 44.9 37.0  MCV 87.5 86.0 85.6  PLT 352 371 321   Basic Metabolic Panel: Recent Labs  Lab 01/21/21 1759 01/21/21 2001 01/21/21 2140 01/22/21 0504 01/23/21 0424  NA 135 136  --  134* 135  K 3.9 6.1* 4.3 4.3 4.5  CL 104 103  --  103 104  CO2 22 25  --  22 21*  GLUCOSE 102* 104*  --  158* 137*  BUN 7 8  --  11 13  CREATININE 0.69 1.01*  --  0.80 0.70  CALCIUM 9.4 9.3  --  9.6 9.0  MG  --   --   --  2.4 2.1  PHOS  --   --   --  1.3* 3.0   GFR: Estimated Creatinine Clearance: 76.7 mL/min (by C-G formula based on SCr of 0.7 mg/dL). Liver Function Tests: Recent Labs  Lab 01/21/21 1759 01/23/21 0424  AST 24 27  ALT 28 28  ALKPHOS 110 98  BILITOT 0.7 0.3  PROT 7.2 6.6  ALBUMIN 3.9 3.5   No results for input(s): LIPASE, AMYLASE in the last 168 hours. No results for input(s): AMMONIA in the last 168 hours. Coagulation Profile: No results for input(s): INR, PROTIME in the last 168 hours. Cardiac Enzymes: No results for input(s): CKTOTAL, CKMB, CKMBINDEX, TROPONINI in the last 168 hours. BNP (last 3 results) No results for input(s): PROBNP in the last 8760 hours. HbA1C: No results for input(s): HGBA1C in the last 72 hours. CBG: No results for input(s): GLUCAP in the last 168 hours. Lipid Profile: No results for input(s): CHOL, HDL, LDLCALC, TRIG, CHOLHDL, LDLDIRECT in the last 72 hours. Thyroid Function Tests: No results for input(s): TSH, T4TOTAL, FREET4, T3FREE, THYROIDAB in the last 72 hours. Anemia Panel: No results for input(s): VITAMINB12, FOLATE, FERRITIN, TIBC, IRON, RETICCTPCT in the last 72 hours. Sepsis  Labs: Recent Labs  Lab 01/22/21 1511 01/22/21 1750 01/23/21 0424  PROCALCITON <0.10  --  <0.10  LATICACIDVEN 2.1* 1.2  --     Recent Results (from the past 240 hour(s))  Resp Panel by RT-PCR (Flu A&B, Covid) Nasopharyngeal Swab     Status: None   Collection Time: 01/21/21  5:42 PM   Specimen: Nasopharyngeal Swab; Nasopharyngeal(NP) swabs in vial transport medium  Result Value Ref Range Status   SARS Coronavirus 2 by RT PCR NEGATIVE NEGATIVE Final    Comment: (NOTE) SARS-CoV-2 target nucleic acids are NOT DETECTED.  The SARS-CoV-2 RNA is generally detectable in upper respiratory specimens during the acute phase of infection. The lowest concentration of SARS-CoV-2 viral copies this assay can detect is 138 copies/mL. A negative result does not preclude SARS-Cov-2 infection and should not be used as the sole basis for treatment or other patient management decisions. A negative result may occur with  improper specimen collection/handling, submission of specimen other than nasopharyngeal swab, presence of viral mutation(s) within the areas targeted by this assay, and inadequate number of viral copies(<138 copies/mL). A negative result must be combined with clinical observations, patient history, and epidemiological information. The expected result is Negative.  Fact Sheet for Patients:  BloggerCourse.com  Fact Sheet for Healthcare Providers:  SeriousBroker.it  This test is no t yet approved or cleared by the [place] FDA and  has been authorized for detection and/or diagnosis of SARS-CoV-2 by FDA under an Emergency Use Authorization (EUA). This EUA will remain  in effect (meaning this test can be used) for the duration of the COVID-19 declaration under Section 564(b)(1) of the Act, 21 U.S.C.section 360bbb-3(b)(1), unless the authorization is terminated  or revoked sooner.       Influenza A by PCR NEGATIVE NEGATIVE Final    Influenza B by PCR NEGATIVE NEGATIVE Final    Comment: (NOTE) The Xpert Xpress SARS-CoV-2/FLU/RSV plus assay is intended as an aid in the diagnosis of influenza from Nasopharyngeal swab specimens and should not be used as a sole basis for treatment. Nasal washings and aspirates are unacceptable for Xpert Xpress SARS-CoV-2/FLU/RSV testing.  Fact Sheet for Patients: BloggerCourse.com  Fact Sheet for Healthcare Providers: SeriousBroker.it  This test is not yet approved or cleared by the [place] and  has been authorized for detection and/or diagnosis of SARS-CoV-2 by FDA under an Emergency Use Authorization (EUA). This EUA will remain in effect (meaning this test can be used) for the duration of the COVID-19 declaration under Section 564(b)(1) of the Act, 21 U.S.C. section 360bbb-3(b)(1), unless the authorization is terminated or revoked.  Performed at [facility], [address], [place], [place] [address]   Resp Panel by RT-PCR (Flu A&B, Covid) Nasopharyngeal Swab     Status: None   Collection Time: 01/21/21  8:01 PM   Specimen: Nasopharyngeal Swab; Nasopharyngeal(NP) swabs in vial transport medium  Result Value Ref Range Status   SARS Coronavirus 2 by RT PCR NEGATIVE NEGATIVE Final    Comment: (NOTE) SARS-CoV-2 target nucleic acids are NOT DETECTED.  The SARS-CoV-2 RNA is generally detectable in upper respiratory specimens during the acute phase of infection. The lowest concentration of SARS-CoV-2 viral copies this assay can detect is 138 copies/mL. A negative result does not preclude SARS-Cov-2 infection and should not be used as the sole basis for treatment or other patient management decisions. A negative result may occur with  improper specimen collection/handling, submission of specimen other than nasopharyngeal swab, presence of viral mutation(s) within the areas targeted by this assay, and inadequate  number of viral copies(<138 copies/mL). A negative result must be combined with clinical observations, patient history, and epidemiological information. The expected result is Negative.  Fact Sheet for Patients:  BloggerCourse.com  Fact Sheet for Healthcare Providers:  SeriousBroker.it  This test is no t yet approved or cleared by the [place] FDA and  has been authorized for detection and/or diagnosis of SARS-CoV-2 by FDA under an Emergency Use Authorization (EUA). This EUA will remain  in effect (meaning this test can be used) for the duration of the COVID-19 declaration under Section 564(b)(1) of the Act, 21 U.S.C.section 360bbb-3(b)(1), unless the authorization is terminated  or revoked sooner.       Influenza A by PCR NEGATIVE NEGATIVE Final   Influenza B by PCR NEGATIVE NEGATIVE Final    Comment: (NOTE) The Xpert Xpress SARS-CoV-2/FLU/RSV plus assay is intended as an aid in the diagnosis of influenza from Nasopharyngeal swab specimens and should not be used as a sole basis for treatment. Nasal washings and aspirates are unacceptable for Xpert Xpress SARS-CoV-2/FLU/RSV testing.  Fact Sheet for Patients: BloggerCourse.com  Fact Sheet for Healthcare Providers: SeriousBroker.it  This test is not yet approved or cleared by the [place] FDA and has been authorized for detection and/or diagnosis of SARS-CoV-2 by FDA under an Emergency Use Authorization (EUA). This EUA will remain in effect (meaning this test can be used) for the duration of the COVID-19 declaration under Section 564(b)(1) of the Act, 21 U.S.C. section 360bbb-3(b)(1), unless the authorization is terminated or revoked.  Performed at [facility], [address] [place], [place] [address]   Respiratory (~20 pathogens) panel by PCR     Status: None   Collection Time: 01/22/21  9:04  AM   Specimen: Nasopharyngeal Swab; Respiratory  Result Value Ref Range Status   Adenovirus NOT DETECTED NOT DETECTED Final   Coronavirus 229E NOT DETECTED NOT DETECTED Final    Comment: (NOTE) The Coronavirus on the Respiratory Panel, DOES NOT test for the novel  Coronavirus (2019 nCoV)    Coronavirus HKU1 NOT DETECTED NOT DETECTED Final   Coronavirus NL63 NOT DETECTED NOT DETECTED Final   Coronavirus OC43 NOT DETECTED NOT DETECTED Final   Metapneumovirus NOT DETECTED NOT  DETECTED Final   Rhinovirus / Enterovirus NOT DETECTED NOT DETECTED Final   Influenza A NOT DETECTED NOT DETECTED Final   Influenza B NOT DETECTED NOT DETECTED Final   Parainfluenza Virus 1 NOT DETECTED NOT DETECTED Final   Parainfluenza Virus 2 NOT DETECTED NOT DETECTED Final   Parainfluenza Virus 3 NOT DETECTED NOT DETECTED Final   Parainfluenza Virus 4 NOT DETECTED NOT DETECTED Final   Respiratory Syncytial Virus NOT DETECTED NOT DETECTED Final   Bordetella pertussis NOT DETECTED NOT DETECTED Final   Bordetella Parapertussis NOT DETECTED NOT DETECTED Final   Chlamydophila pneumoniae NOT DETECTED NOT DETECTED Final   Mycoplasma pneumoniae NOT DETECTED NOT DETECTED Final    Comment: Performed at [facility], [address] [place], [place] [address]  Expectorated Sputum Assessment w Gram Stain, Rflx to Resp Cult     Status: None   Collection Time: 01/22/21  4:25 PM   Specimen: Sputum  Result Value Ref Range Status   Specimen Description SPUTUM EXPECTORATED  Final   Special Requests NONE  Final   Sputum evaluation   Final    THIS SPECIMEN IS ACCEPTABLE FOR SPUTUM CULTURE Performed at [facility], [address], [place], [place] [address]    Report Status 01/22/2021 FINAL  Final  Culture, Respiratory w Gram Stain     Status: None (Preliminary result)   Collection Time: 01/22/21  4:25 PM   Specimen: SPU  Result Value Ref Range Status   Specimen Description   Final    SPUTUM  EXPECTORATED Performed at [facility], [address], [place], [place] [address]    Special Requests   Final    NONE Reflexed from [phone] [phone] Performed at [facility], [address] [place], [place] [address]    Gram Stain   Final    FEW WBC PRESENT, PREDOMINANTLY PMN ABUNDANT GRAM NEGATIVE COCCOBACILLI Performed at [facility], [address] [place], [place] [address]    Culture PENDING  Incomplete   Report Status PENDING  Incomplete         Radiology Studies: DG Chest Port 1 View  Result Date: 01/21/2021 CLINICAL DATA:  Shortness of breath EXAM: PORTABLE CHEST 1 VIEW COMPARISON:  02/23/2020 FINDINGS: Heart and mediastinal contours are within normal limits. No focal opacities or effusions. No acute bony abnormality. IMPRESSION: No active disease. Electronically Signed   By: Charlett Nose M.D.   On: 01/21/2021 20:08        Scheduled Meds: . enoxaparin (LOVENOX) injection  40 mg Subcutaneous Q24H  . ipratropium-albuterol  3 mL Nebulization Q6H  . methylPREDNISolone (SOLU-MEDROL) injection  60 mg Intravenous Q6H  . montelukast  10 mg Oral QHS   Continuous Infusions: . sodium chloride Stopped (01/22/21 1114)  . sodium chloride 100 mL/hr at 01/23/21 0524  . azithromycin Stopped (01/22/21 1721)  . cefTRIAXone (ROCEPHIN)  IV Stopped (01/22/21 1754)     LOS: 2 days    Time spent:40 min    Sriyan Cutting, Roselind Messier, MD [facility]   If 7PM-7AM, please contact night-coverage 01/23/2021, 1:40 PM

## 2021-01-23 NOTE — Plan of Care (Signed)

## 2021-01-24 ENCOUNTER — Other Ambulatory Visit: Payer: Self-pay

## 2021-01-24 LAB — CBC WITH DIFFERENTIAL/PLATELET
Abs Immature Granulocytes: 0.13 10*3/uL — ABNORMAL HIGH (ref 0.00–0.07)
Basophils Absolute: 0 10*3/uL (ref 0.0–0.1)
Basophils Relative: 0 %
Eosinophils Absolute: 0 10*3/uL (ref 0.0–0.5)
Eosinophils Relative: 0 %
HCT: 35.5 % — ABNORMAL LOW (ref 36.0–46.0)
Hemoglobin: 11.8 g/dL — ABNORMAL LOW (ref 12.0–15.0)
Immature Granulocytes: 1 %
Lymphocytes Relative: 4 %
Lymphs Abs: 0.6 10*3/uL — ABNORMAL LOW (ref 0.7–4.0)
MCH: 28.8 pg (ref 26.0–34.0)
MCHC: 33.2 g/dL (ref 30.0–36.0)
MCV: 86.6 fL (ref 80.0–100.0)
Monocytes Absolute: 0.4 10*3/uL (ref 0.1–1.0)
Monocytes Relative: 2 %
Neutro Abs: 16 10*3/uL — ABNORMAL HIGH (ref 1.7–7.7)
Neutrophils Relative %: 93 %
Platelets: 282 10*3/uL (ref 150–400)
RBC: 4.1 MIL/uL (ref 3.87–5.11)
RDW: 15.1 % (ref 11.5–15.5)
WBC: 17.1 10*3/uL — ABNORMAL HIGH (ref 4.0–10.5)
nRBC: 0 % (ref 0.0–0.2)

## 2021-01-24 LAB — COMPREHENSIVE METABOLIC PANEL
ALT: 26 U/L (ref 0–44)
AST: 17 U/L (ref 15–41)
Albumin: 3.2 g/dL — ABNORMAL LOW (ref 3.5–5.0)
Alkaline Phosphatase: 83 U/L (ref 38–126)
Anion gap: 6 (ref 5–15)
BUN: 15 mg/dL (ref 6–20)
CO2: 25 mmol/L (ref 22–32)
Calcium: 8.7 mg/dL — ABNORMAL LOW (ref 8.9–10.3)
Chloride: 103 mmol/L (ref 98–111)
Creatinine, Ser: 0.62 mg/dL (ref 0.44–1.00)
GFR, Estimated: >60 mL/min (ref >60)
Glucose, Bld: 129 mg/dL — ABNORMAL HIGH (ref 70–99)
Potassium: 4.3 mmol/L (ref 3.5–5.1)
Sodium: 134 mmol/L — ABNORMAL LOW (ref 135–145)
Total Bilirubin: 0.6 mg/dL (ref 0.3–1.2)
Total Protein: 6.1 g/dL — ABNORMAL LOW (ref 6.5–8.1)

## 2021-01-24 LAB — PROCALCITONIN: Procalcitonin: <0.1 ng/mL

## 2021-01-24 LAB — MAGNESIUM: Magnesium: 2.1 mg/dL (ref 1.7–2.4)

## 2021-01-24 LAB — PHOSPHORUS: Phosphorus: 3 mg/dL (ref 2.5–4.6)

## 2021-01-24 MED ORDER — PREDNISONE 10 MG PO TABS
ORAL_TABLET | ORAL | 0 refills | Status: DC
  Filled 2021-01-24: qty 19, 7d supply, fill #0

## 2021-01-24 MED ORDER — FLUTICASONE PROPIONATE 50 MCG/ACT NA SUSP
2.0000 | Freq: Every day | NASAL | 6 refills | Status: DC
  Filled 2021-01-24: qty 16, 30d supply, fill #0
  Filled 2021-05-25: qty 16, 30d supply, fill #1

## 2021-01-24 MED ORDER — MONTELUKAST SODIUM 10 MG PO TABS
10.0000 mg | ORAL_TABLET | Freq: Every day | ORAL | 3 refills | Status: DC
  Filled 2021-01-24: qty 30, 30d supply, fill #0
  Filled 2021-03-31: qty 30, 30d supply, fill #1
  Filled 2021-05-25 – 2021-06-06 (×2): qty 30, 30d supply, fill #2

## 2021-01-24 MED ORDER — DOXYCYCLINE HYCLATE 100 MG PO TABS
100.0000 mg | ORAL_TABLET | Freq: Two times a day (BID) | ORAL | 0 refills | Status: AC
  Filled 2021-01-24: qty 6, 3d supply, fill #0

## 2021-01-24 MED ORDER — FLUTICASONE FUROATE-VILANTEROL 100-25 MCG/INH IN AEPB
1.0000 | INHALATION_SPRAY | Freq: Every day | RESPIRATORY_TRACT | Status: DC
  Filled 2021-01-24: qty 28

## 2021-01-24 MED ORDER — TRELEGY ELLIPTA 200-62.5-25 MCG/INH IN AEPB
1.0000 | INHALATION_SPRAY | Freq: Every day | RESPIRATORY_TRACT | 2 refills | Status: DC
  Filled 2021-01-24: qty 1, 30d supply, fill #0
  Filled 2021-03-30: qty 1, 30d supply, fill #1

## 2021-01-24 MED ORDER — ALBUTEROL SULFATE HFA 108 (90 BASE) MCG/ACT IN AERS
2.0000 | INHALATION_SPRAY | Freq: Four times a day (QID) | RESPIRATORY_TRACT | 2 refills | Status: DC | PRN
  Filled 2021-01-24: qty 18, 25d supply, fill #0

## 2021-01-24 MED ORDER — ALBUTEROL SULFATE (2.5 MG/3ML) 0.083% IN NEBU
2.5000 mg | INHALATION_SOLUTION | RESPIRATORY_TRACT | 5 refills | Status: DC | PRN
  Filled 2021-01-24: qty 75, 4d supply, fill #0
  Filled 2021-03-31: qty 75, 4d supply, fill #1
  Filled 2021-05-25: qty 75, 4d supply, fill #2

## 2021-01-24 MED ORDER — IPRATROPIUM-ALBUTEROL 0.5-2.5 (3) MG/3ML IN SOLN
3.0000 mL | RESPIRATORY_TRACT | 2 refills | Status: DC | PRN
  Filled 2021-01-24 – 2021-01-25 (×2): qty 360, 20d supply, fill #0

## 2021-01-24 NOTE — TOC Transition Note (Signed)
Transition of Care [facility]) - CM/SW Discharge Note   Patient Details  Name: Lydia Griffin MRN: [id number] Date of Birth: 01/23/1961  Transition of [facility] CM/SW Contact:  Lanier Clam, RN Phone Number: 01/24/2021, 12:31 PM   Clinical Narrative:Provided w/MATCH letter for med asst-[facility] pharmacy will asst. Patient informed of $3/co pay for each med-voiced understanding. Patient aware to go directly to pharmacy @ d/c with [facility] letter.No further CM needs.       Final next level of care: Home/Self Care Barriers to Discharge: No Barriers Identified   Patient Goals and CMS Choice        Discharge Placement                       Discharge Plan and Services                                     Social Determinants of Health (SDOH) Interventions     Readmission Risk Interventions No flowsheet data found.

## 2021-01-24 NOTE — Plan of Care (Signed)
Plan of care discussed with pt.

## 2021-01-24 NOTE — Discharge Summary (Signed)
Physician Discharge Summary  [name] HYQ:[id number] DOB: 1961-03-08 DOA: 01/21/2021  PCP: Cora Collum, DO  Admit date: 01/21/2021 Discharge date: 01/24/2021  Admitted From: Home  Discharge disposition: Home   Recommendations for Outpatient Follow-Up:   . Follow up with your primary care provider in one week.  . Check CBC, BMP, magnesium in the next visit . Medication assistance has been provided for inhaled steroids bronchodilators and other medication.  Patient would benefit from ongoing assistance with medication. . Patient would ideally benefit from follow-up with pulmonary in 6 to 8 weeks per spirometry pulmonary function test and further management.   Discharge Diagnosis:   Principal Problem:   Acute asthma exacerbation Active Problems:   Acute hypoxemic respiratory failure (HCC)   Severe sepsis (HCC)   COPD with acute exacerbation (HCC)   Discharge Condition: Improved.  Diet recommendation:  Regular.  Wound care: None.  Code status: Full.   History of Present Illness:   Lydia Griffin a 60 y.o.years old female with severe persistent asthma with multiple hospitalization and intubation in the past, depression presented to hospital with increasing shortness of breath cough wheezing.  Patient had finished 5-day course of prednisone end of March but could not afford her inhalers and had presented to her primary care office.  Patient was then sent to the ED due to ongoing wheezes and respiratory distress.  In the ED patient received IV Solu-Medrol 125 mg DuoNebs and was admitted to hospital.  Covid and influenza was negative.  Patient was previously on [facility] and then had been switched to Trelegy.  Trelegy has been working well but she has not been able to afford it.  Patient was then admitted hospital for further evaluation and treatment.   Hospital Course:   Following conditions were addressed during hospitalization as listed below,  Severe  sepsis Present on admission with tachycardia tachypnea and lactate of more than 2 and possible pneumonia as infection.  Has improved this time.   Acute respiratory failure with hypoxia/acute exacerbation severe persistent asthma Ambulatory medication noncompliance.  Transition of care was consulted for medication assistance on discharge.  Will be discharged home on inhaled steroids oral steroids bronchodilators.  Medication assistance will be given.  Patient was advised to follow-up with her primary care physician as outpatient.  Patient was initially on supplemental oxygen which has been weaned off and is currently on room air.  Will be empirically treated with doxycycline for the next 3 days for possible pneumonia and bronchitis.  Emphysema/COPD exacerbation/ILD Improved clinically at this time.  We will continue albuterol inhaler oral steroids inhaled steroids on discharge.  Patient will benefit from follow-up with primary care patient and pulmonary physician in 6 to 8 weeks.  Patient has not been able to take her inhalers at home currently and medication assistance will be provided to the patient  Hypophosphatemia Replenished.  Headache  required Toradol and Imitrex during hospitalization.  No headache today.  Disposition.  At this time, patient is stable for disposition home with outpatient PCP follow-up.  Medical Consultants:    None.  Procedures:    None Subjective:   Today, patient was seen and examined at bedside.  Continues to feel better with breathing.  Ambulated to the bedside and hallway.  Mild wheezing but no cough or dyspnea  Discharge Exam:   Vitals:   01/24/21 0741 01/24/21 1307  BP:  (!) 147/93  Pulse:  80  Resp:  16  Temp:  98.4 F (36.9 C)  SpO2: 99% 98%   Vitals:   01/24/21 0124 01/24/21 0601 01/24/21 0741 01/24/21 1307  BP:  138/80  (!) 147/93  Pulse:  67  80  Resp:  18  16  Temp:  98 F (36.7 C)  98.4 F (36.9 C)  TempSrc:  Oral  Oral   SpO2: 99% 97% 99% 98%  Weight:      Height:        General: Alert awake, not in obvious distress HENT: pupils equally reacting to light,  No scleral pallor or icterus noted. Oral mucosa is moist.  Chest:   Diminished breath sounds bilaterally.  Mild wheezes noted CVS: S1 &S2 heard. No murmur.  Regular rate and rhythm. Abdomen: Soft, nontender, nondistended.  Bowel sounds are heard.   Extremities: No cyanosis, clubbing or edema.  Peripheral pulses are palpable. Psych: Alert, awake and oriented, normal mood CNS:  No cranial nerve deficits.  Power equal in all extremities.   Skin: Warm and dry.  No rashes noted.  The results of significant diagnostics from this hospitalization (including imaging, microbiology, ancillary and laboratory) are listed below for reference.     Diagnostic Studies:   DG Chest Port 1 View  Result Date: 01/21/2021 CLINICAL DATA:  Shortness of breath EXAM: PORTABLE CHEST 1 VIEW COMPARISON:  02/23/2020 FINDINGS: Heart and mediastinal contours are within normal limits. No focal opacities or effusions. No acute bony abnormality. IMPRESSION: No active disease. Electronically Signed   By: Charlett Nose M.D.   On: 01/21/2021 20:08     Labs:   Basic Metabolic Panel: Recent Labs  Lab 01/21/21 1759 01/21/21 2001 01/21/21 2140 01/22/21 0504 01/23/21 0424 01/24/21 0422  NA 135 136  --  134* 135 134*  K 3.9 6.1*   < > 4.3 4.5 4.3  CL 104 103  --  103 104 103  CO2 22 25  --  22 21* 25  GLUCOSE 102* 104*  --  158* 137* 129*  BUN 7 8  --  11 13 15   CREATININE 0.69 1.01*  --  0.80 0.70 0.62  CALCIUM 9.4 9.3  --  9.6 9.0 8.7*  MG  --   --   --  2.4 2.1 2.1  PHOS  --   --   --  1.3* 3.0 3.0   < > = values in this interval not displayed.   GFR Estimated Creatinine Clearance: 76.7 mL/min (by C-G formula based on SCr of 0.62 mg/dL). Liver Function Tests: Recent Labs  Lab 01/21/21 1759 01/23/21 0424 01/24/21 0422  AST 24 27 17   ALT 28 28 26   ALKPHOS 110 98 83   BILITOT 0.7 0.3 0.6  PROT 7.2 6.6 6.1*  ALBUMIN 3.9 3.5 3.2*   No results for input(s): LIPASE, AMYLASE in the last 168 hours. No results for input(s): AMMONIA in the last 168 hours. Coagulation profile No results for input(s): INR, PROTIME in the last 168 hours.  CBC: Recent Labs  Lab 01/21/21 1759 01/21/21 2001 01/23/21 0424 01/24/21 0422  WBC 9.6 10.3 25.9* 17.1*  NEUTROABS 6.1 6.9 24.0* 16.0*  HGB 14.4 15.3* 12.5 11.8*  HCT 43.3 44.9 37.0 35.5*  MCV 87.5 86.0 85.6 86.6  PLT 352 371 321 282   Cardiac Enzymes: No results for input(s): CKTOTAL, CKMB, CKMBINDEX, TROPONINI in the last 168 hours. BNP: Invalid input(s): POCBNP CBG: No results for input(s): GLUCAP in the last 168 hours. D-Dimer No results for input(s): DDIMER in the last 72 hours. Hgb A1c No results  for input(s): HGBA1C in the last 72 hours. Lipid Profile No results for input(s): CHOL, HDL, LDLCALC, TRIG, CHOLHDL, LDLDIRECT in the last 72 hours. Thyroid function studies No results for input(s): TSH, T4TOTAL, T3FREE, THYROIDAB in the last 72 hours.  Invalid input(s): FREET3 Anemia work up No results for input(s): VITAMINB12, FOLATE, FERRITIN, TIBC, IRON, RETICCTPCT in the last 72 hours. Microbiology Recent Results (from the past 240 hour(s))  Resp Panel by RT-PCR (Flu A&B, Covid) Nasopharyngeal Swab     Status: None   Collection Time: 01/21/21  5:42 PM   Specimen: Nasopharyngeal Swab; Nasopharyngeal(NP) swabs in vial transport medium  Result Value Ref Range Status   SARS Coronavirus 2 by RT PCR NEGATIVE NEGATIVE Final    Comment: (NOTE) SARS-CoV-2 target nucleic acids are NOT DETECTED.  The SARS-CoV-2 RNA is generally detectable in upper respiratory specimens during the acute phase of infection. The lowest concentration of SARS-CoV-2 viral copies this assay can detect is 138 copies/mL. A negative result does not preclude SARS-Cov-2 infection and should not be used as the sole basis for treatment  or other patient management decisions. A negative result may occur with  improper specimen collection/handling, submission of specimen other than nasopharyngeal swab, presence of viral mutation(s) within the areas targeted by this assay, and inadequate number of viral copies(<138 copies/mL). A negative result must be combined with clinical observations, patient history, and epidemiological information. The expected result is Negative.  Fact Sheet for Patients:  BloggerCourse.com  Fact Sheet for Healthcare Providers:  SeriousBroker.it  This test is no t yet approved or cleared by the [place] FDA and  has been authorized for detection and/or diagnosis of SARS-CoV-2 by FDA under an Emergency Use Authorization (EUA). This EUA will remain  in effect (meaning this test can be used) for the duration of the COVID-19 declaration under Section 564(b)(1) of the Act, 21 U.S.C.section 360bbb-3(b)(1), unless the authorization is terminated  or revoked sooner.       Influenza A by PCR NEGATIVE NEGATIVE Final   Influenza B by PCR NEGATIVE NEGATIVE Final    Comment: (NOTE) The Xpert Xpress SARS-CoV-2/FLU/RSV plus assay is intended as an aid in the diagnosis of influenza from Nasopharyngeal swab specimens and should not be used as a sole basis for treatment. Nasal washings and aspirates are unacceptable for Xpert Xpress SARS-CoV-2/FLU/RSV testing.  Fact Sheet for Patients: BloggerCourse.com  Fact Sheet for Healthcare Providers: SeriousBroker.it  This test is not yet approved or cleared by the [place] FDA and has been authorized for detection and/or diagnosis of SARS-CoV-2 by FDA under an Emergency Use Authorization (EUA). This EUA will remain in effect (meaning this test can be used) for the duration of the COVID-19 declaration under Section 564(b)(1) of the Act, 21 U.S.C. section  360bbb-3(b)(1), unless the authorization is terminated or revoked.  Performed at [facility], [address], [place], [place] [address]   Resp Panel by RT-PCR (Flu A&B, Covid) Nasopharyngeal Swab     Status: None   Collection Time: 01/21/21  8:01 PM   Specimen: Nasopharyngeal Swab; Nasopharyngeal(NP) swabs in vial transport medium  Result Value Ref Range Status   SARS Coronavirus 2 by RT PCR NEGATIVE NEGATIVE Final    Comment: (NOTE) SARS-CoV-2 target nucleic acids are NOT DETECTED.  The SARS-CoV-2 RNA is generally detectable in upper respiratory specimens during the acute phase of infection. The lowest concentration of SARS-CoV-2 viral copies this assay can detect is 138 copies/mL. A negative result does not preclude SARS-Cov-2  infection and should not be used as the sole basis for treatment or other patient management decisions. A negative result may occur with  improper specimen collection/handling, submission of specimen other than nasopharyngeal swab, presence of viral mutation(s) within the areas targeted by this assay, and inadequate number of viral copies(<138 copies/mL). A negative result must be combined with clinical observations, patient history, and epidemiological information. The expected result is Negative.  Fact Sheet for Patients:  BloggerCourse.com  Fact Sheet for Healthcare Providers:  SeriousBroker.it  This test is no t yet approved or cleared by the [place] FDA and  has been authorized for detection and/or diagnosis of SARS-CoV-2 by FDA under an Emergency Use Authorization (EUA). This EUA will remain  in effect (meaning this test can be used) for the duration of the COVID-19 declaration under Section 564(b)(1) of the Act, 21 U.S.C.section 360bbb-3(b)(1), unless the authorization is terminated  or revoked sooner.       Influenza A by PCR NEGATIVE NEGATIVE Final   Influenza B by PCR NEGATIVE  NEGATIVE Final    Comment: (NOTE) The Xpert Xpress SARS-CoV-2/FLU/RSV plus assay is intended as an aid in the diagnosis of influenza from Nasopharyngeal swab specimens and should not be used as a sole basis for treatment. Nasal washings and aspirates are unacceptable for Xpert Xpress SARS-CoV-2/FLU/RSV testing.  Fact Sheet for Patients: BloggerCourse.com  Fact Sheet for Healthcare Providers: SeriousBroker.it  This test is not yet approved or cleared by the [place] FDA and has been authorized for detection and/or diagnosis of SARS-CoV-2 by FDA under an Emergency Use Authorization (EUA). This EUA will remain in effect (meaning this test can be used) for the duration of the COVID-19 declaration under Section 564(b)(1) of the Act, 21 U.S.C. section 360bbb-3(b)(1), unless the authorization is terminated or revoked.  Performed at [facility], [address], [place], [place] [address]   Respiratory (~20 pathogens) panel by PCR     Status: None   Collection Time: 01/22/21  9:04 AM   Specimen: Nasopharyngeal Swab; Respiratory  Result Value Ref Range Status   Adenovirus NOT DETECTED NOT DETECTED Final   Coronavirus 229E NOT DETECTED NOT DETECTED Final    Comment: (NOTE) The Coronavirus on the Respiratory Panel, DOES NOT test for the novel  Coronavirus (2019 nCoV)    Coronavirus HKU1 NOT DETECTED NOT DETECTED Final   Coronavirus NL63 NOT DETECTED NOT DETECTED Final   Coronavirus OC43 NOT DETECTED NOT DETECTED Final   Metapneumovirus NOT DETECTED NOT DETECTED Final   Rhinovirus / Enterovirus NOT DETECTED NOT DETECTED Final   Influenza A NOT DETECTED NOT DETECTED Final   Influenza B NOT DETECTED NOT DETECTED Final   Parainfluenza Virus 1 NOT DETECTED NOT DETECTED Final   Parainfluenza Virus 2 NOT DETECTED NOT DETECTED Final   Parainfluenza Virus 3 NOT DETECTED NOT DETECTED Final   Parainfluenza Virus 4 NOT  DETECTED NOT DETECTED Final   Respiratory Syncytial Virus NOT DETECTED NOT DETECTED Final   Bordetella pertussis NOT DETECTED NOT DETECTED Final   Bordetella Parapertussis NOT DETECTED NOT DETECTED Final   Chlamydophila pneumoniae NOT DETECTED NOT DETECTED Final   Mycoplasma pneumoniae NOT DETECTED NOT DETECTED Final    Comment: Performed at [facility], [address] [place], [place] [address]  Expectorated Sputum Assessment w Gram Stain, Rflx to Resp Cult     Status: None   Collection Time: 01/22/21  4:25 PM   Specimen: Sputum  Result Value Ref Range Status  Specimen Description SPUTUM EXPECTORATED  Final   Special Requests NONE  Final   Sputum evaluation   Final    THIS SPECIMEN IS ACCEPTABLE FOR SPUTUM CULTURE Performed at [facility], [address], [place], [place] [address]    Report Status 01/22/2021 FINAL  Final  Culture, Respiratory w Gram Stain     Status: None (Preliminary result)   Collection Time: 01/22/21  4:25 PM   Specimen: SPU  Result Value Ref Range Status   Specimen Description   Final    SPUTUM EXPECTORATED Performed at [facility], [address], [place], [place] [address]    Special Requests   Final    NONE Reflexed from [id number] Performed at [facility], [address], [place], [place] [address]    Gram Stain   Final    FEW WBC PRESENT, PREDOMINANTLY PMN ABUNDANT GRAM NEGATIVE COCCOBACILLI    Culture   Final    CULTURE REINCUBATED FOR BETTER GROWTH Performed at [facility], [address], [place], [place] [address]    Report Status PENDING  Incomplete     Discharge Instructions:   Discharge Instructions    Call MD for:  difficulty breathing, headache or visual disturbances   Complete by: As directed    Call MD for:  severe uncontrolled pain   Complete by: As directed    Call MD for:  temperature >100.4   Complete by: As directed    Diet general   Complete by: As  directed    Discharge instructions   Complete by: As directed    Follow-up with your primary care provider in 1 week.  Continue to take all medications as prescribed.  Encourage use of mask outdoors.   Increase activity slowly   Complete by: As directed      Allergies as of 01/24/2021   No Known Allergies     Medication List    STOP taking these medications   Breo Ellipta 100-25 MCG/INH Aepb Generic drug: fluticasone furoate-vilanterol   lidocaine 5 % Commonly known as: Lidoderm   methocarbamol 500 MG tablet Commonly known as: ROBAXIN   ondansetron 4 MG disintegrating tablet Commonly known as: ZOFRAN-ODT   ondansetron 4 MG tablet Commonly known as: ZOFRAN   sertraline 50 MG tablet Commonly known as: ZOLOFT     TAKE these medications   albuterol 108 (90 Base) MCG/ACT inhaler Commonly known as: VENTOLIN HFA Inhale 2 puffs into the lungs every 6 (six) hours as needed for wheezing or shortness of breath.   albuterol (2.5 MG/3ML) 0.083% nebulizer solution Commonly known as: PROVENTIL Take 3 mLs (2.5 mg total) by nebulization every 4 (four) hours as needed for wheezing or shortness of breath.   ARTIFICIAL TEARS OP Place 1 drop into both eyes daily as needed (dry eyes).   Biotin 1 MG Caps Take 1 mg by mouth daily.   doxycycline 100 MG tablet Commonly known as: VIBRA-TABS Take 1 tablet (100 mg total) by mouth 2 (two) times daily for 3 days.   fluticasone 50 MCG/ACT nasal spray Commonly known as: FLONASE Place 2 sprays into both nostrils daily.   ipratropium-albuterol 0.5-2.5 (3) MG/3ML Soln Commonly known as: DUONEB Inhale 3 mLs into the lungs every 4 (four) hours as needed (wheezing).   montelukast 10 MG tablet Commonly known as: SINGULAIR Take 1 tablet (10 mg total) by mouth at bedtime.   multivitamin with minerals tablet Take 1 tablet by mouth daily.  predniSONE 10 MG tablet Commonly known as: DELTASONE Take 4 tablets (40 mg) daily for 2 days,  then, Take 3 tablets (30 mg) daily for 2 days, then, Take 2 tablets (20 mg) daily for 2 days, then, Take 1 tablets (10 mg) daily for 1 days, then stop   Trelegy Ellipta 200-62.5-25 MCG/INH Aepb Generic drug: Fluticasone-Umeclidin-Vilant Inhale 1 Inhaler into the lungs daily.   VITAMIN B 12 PO Take 1,000 mcg by mouth daily.       Follow-up Information    Cora Collum, DO. Schedule an appointment as soon as possible for a visit in 1 week(s).   Specialty: Family Medicine Why: Regular follow-up Contact information: [address] [place] [address] [phone]                Time coordinating discharge: 39 minutes  Signed:  Scarlet Abad  [facility] 01/24/2021, 3:05 PM

## 2021-01-25 ENCOUNTER — Telehealth: Payer: Self-pay

## 2021-01-25 ENCOUNTER — Other Ambulatory Visit: Payer: Self-pay

## 2021-01-25 LAB — CULTURE, RESPIRATORY W GRAM STAIN: Culture: NORMAL

## 2021-01-25 NOTE — Telephone Encounter (Signed)
Patient calls nurse line reporting she was released from the hospital with Doxycycline. Patient reports she remembers now she can not take this medication due to causing black bile and black stools. Patient reports she did take a couple of tabs and has noticed the darker stools. Patient denies any nausea or vomiting or blood in the toilet after a BM. Patient has been scheduled for 4/13 for a hospital FU. In the meantime please advise on medication change.

## 2021-01-26 ENCOUNTER — Other Ambulatory Visit: Payer: Self-pay

## 2021-01-31 NOTE — Telephone Encounter (Signed)
I discussed with patient and we are holding off on antibiotics for now. Will reassess at our visit this week. Thanks!

## 2021-02-01 ENCOUNTER — Other Ambulatory Visit: Payer: Self-pay | Admitting: Family Medicine

## 2021-02-01 DIAGNOSIS — J45901 Unspecified asthma with (acute) exacerbation: Secondary | ICD-10-CM

## 2021-02-01 NOTE — Progress Notes (Signed)
    SUBJECTIVE:   CHIEF COMPLAINT / HPI:   Ms. Becknell is a 60 yo who presents for a hospital follow up. She went to the ED on 4/1 due to ongoing wheezes and respiratory distress. Was discharged with predisone taper, albuterol inhaler and neb, fluticasone , Ipratropium-albuterol, and montelukast. Was discharged on Doxycycline but patient did not take due to stomach upset.   Today she states she feels a lot better.  She received medication assistance in the hospital for her inhalers.  She states the Trelegy she was switched to from [place] works for her, but she has been unable to afford it.  Has been working with Dr. Raymondo Band in pharmacy clinic to get samples of inhalers.  States that she has not been eligible to receive government assisted support since she was incarcerated. Is eligible for insurance through [facility] but states she did not have enough money for the insurance.   PERTINENT  PMH / PSH:  Severe persistent asthma   OBJECTIVE:   BP (!) 146/80   Pulse 88   Wt 181 lb 9.6 oz (82.4 kg)   SpO2 98%   BMI 29.31 kg/m    General: alert, pleasant, NAD CV: RRR no murmurs Resp: Diffuse wheezing in all lung fields bilaterally. Normal WOB GI: soft, non distended Skin: warm, dry. No visible lesions   ASSESSMENT/PLAN:   No problem-specific Assessment & Plan notes found for this encounter.   Hospital follow up  Severe persistent asthma  - check BMP, CBC, Mag  - continue Trelegy daily - albuterol prn   - will discuss PFTs with Dr. Raymondo Band  - consider pulmonology referral   Health maintenance - check lipid panel and A1c  - f/u in next couple of weeks for pap smear and BP check  Cora Collum, DO [facility]

## 2021-02-02 ENCOUNTER — Ambulatory Visit (INDEPENDENT_AMBULATORY_CARE_PROVIDER_SITE_OTHER): Payer: Self-pay | Admitting: Family Medicine

## 2021-02-02 ENCOUNTER — Encounter: Payer: Self-pay | Admitting: Family Medicine

## 2021-02-02 ENCOUNTER — Other Ambulatory Visit: Payer: Self-pay

## 2021-02-02 VITALS — BP 146/80 | HR 88 | Wt 181.6 lb

## 2021-02-02 DIAGNOSIS — J4550 Severe persistent asthma, uncomplicated: Secondary | ICD-10-CM

## 2021-02-02 NOTE — Patient Instructions (Signed)
It was great seeing you today!  Today we followed up on your hospitalization for asthma. I am glad you are doing better! Please continue to take the Trelegy and use your inhaler as needed.  Please check-out at the front desk before leaving the clinic. I'd like to see you back as soon as you can for your pap smear and to check on your blood pressure.   Regarding lab work today:  Due to recent changes in healthcare laws, you may see the results of your imaging and laboratory studies on MyChart before your provider has had a chance to review them.  I understand that in some cases there may be results that are confusing or concerning to you. Not all laboratory results come back in the same time frame and you may be waiting for multiple results in order to interpret others.  Please give [place] 72 hours in order for your provider to thoroughly review all the results before contacting the office for clarification of your results. If everything is normal, you will get a letter in the mail or a message in My Chart. Please give [place] a call if you do not hear from [place] after 2 weeks.  Please bring all of your medications with you to each visit.    If you haven't already, sign up for My Chart to have easy access to your labs results, and communication with your primary care physician.  Feel free to call with any questions or concerns at any time, at [phone].   Take care,  Dr. Cora Collum [facility]

## 2021-02-03 LAB — HEMOGLOBIN A1C
Est. average glucose Bld gHb Est-mCnc: 103 mg/dL
Hgb A1c MFr Bld: 5.2 % (ref 4.8–5.6)

## 2021-02-03 LAB — CBC
Hematocrit: 30.4 % — ABNORMAL LOW (ref 34.0–46.6)
Hemoglobin: 9.6 g/dL — ABNORMAL LOW (ref 11.1–15.9)
MCH: 28.1 pg (ref 26.6–33.0)
MCHC: 31.6 g/dL (ref 31.5–35.7)
MCV: 89 fL (ref 79–97)
Platelets: 379 10*3/uL (ref 150–450)
RBC: 3.42 x10E6/uL — ABNORMAL LOW (ref 3.77–5.28)
RDW: 13.9 % (ref 11.7–15.4)
WBC: 9.4 10*3/uL (ref 3.4–10.8)

## 2021-02-03 LAB — LIPID PANEL
Chol/HDL Ratio: 2.9 ratio (ref 0.0–4.4)
Cholesterol, Total: 223 mg/dL — ABNORMAL HIGH (ref 100–199)
HDL: 76 mg/dL (ref >39)
LDL Chol Calc (NIH): 131 mg/dL — ABNORMAL HIGH (ref 0–99)
Triglycerides: 92 mg/dL (ref 0–149)
VLDL Cholesterol Cal: 16 mg/dL (ref 5–40)

## 2021-02-03 LAB — MAGNESIUM: Magnesium: 2.2 mg/dL (ref 1.6–2.3)

## 2021-02-03 LAB — BASIC METABOLIC PANEL
BUN/Creatinine Ratio: 10 — ABNORMAL LOW (ref 12–28)
BUN: 8 mg/dL (ref 8–27)
CO2: 24 mmol/L (ref 20–29)
Calcium: 8.7 mg/dL (ref 8.7–10.3)
Chloride: 105 mmol/L (ref 96–106)
Creatinine, Ser: 0.78 mg/dL (ref 0.57–1.00)
Glucose: 96 mg/dL (ref 65–99)
Potassium: 4.1 mmol/L (ref 3.5–5.2)
Sodium: 140 mmol/L (ref 134–144)
eGFR: 87 mL/min/{1.73_m2} (ref >59)

## 2021-02-16 ENCOUNTER — Other Ambulatory Visit: Payer: Self-pay | Admitting: Family Medicine

## 2021-03-30 ENCOUNTER — Other Ambulatory Visit: Payer: Self-pay

## 2021-03-30 MED ORDER — TRELEGY ELLIPTA 200-62.5-25 MCG/INH IN AEPB
INHALATION_SPRAY | RESPIRATORY_TRACT | 1 refills | Status: DC
  Filled 2021-03-30: qty 60, 30d supply, fill #0
  Filled 2021-05-25 – 2021-06-06 (×2): qty 60, 30d supply, fill #1

## 2021-03-31 ENCOUNTER — Other Ambulatory Visit: Payer: Self-pay

## 2021-04-01 ENCOUNTER — Other Ambulatory Visit: Payer: Self-pay

## 2021-05-14 ENCOUNTER — Telehealth: Payer: Self-pay | Admitting: Family Medicine

## 2021-05-14 NOTE — Telephone Encounter (Signed)
**  AFTER HOURS EMERGENCY LINE CALL**  11:20am: Received after hours page. Patient believes she has been exposed to COVID last Tuesday, July 19th. Granddaughter who was there on Tuesday tested positive today.   Symptoms starting Thursday: scratchy throat, "junky cough", headache, nausea, retching, weakness, couple episode of diarrhea, and fevers (Tmax 101.4*F on Thursday PM)  Denies shortness of breath, frank vomiting, anosmia, dysgeusia.   PMHx asthma.  Has been using breathing treatments every 4 and using flutter valve because she wanted to get ahead of this "junky cough".  Again, no shortness of breath, is breathing well.  COVID exposure Doubt symptoms above represent COVID infection given proximity to initial known exposure.  However, could have acquired in community prior to Tuesday exposure.  Given her vaccination status (initial COVID vaccine x2, no booster), recommended home quarantine through end of Sunday with testing for COVID Sunday-Monday per current CDC guidelines.  Nausea Still able to nibble on foods and maintain hydration.  Recommended if nausea became worse and she was unable to hydrate, call us back and we will prescribe an antiemetic.  QTC normal last EKG 01/24/2021.  Fayette Pho, MD PGY-2, [facility] Service pager [phone]

## 2021-05-17 NOTE — Telephone Encounter (Signed)
Patient returns call to nurse line. Reports that she tested positive on Monday, 7/25 for COVID.   Advised of supportive measures. Patient is requesting to schedule an appointment for further evaluation and possible treatment options as she has history of asthma.   Scheduled patient in [facility] clinic on Thursday afternoon.    [name], RN

## 2021-05-18 NOTE — Progress Notes (Deleted)
    SUBJECTIVE:   CHIEF COMPLAINT / HPI:   COVID-19 Tested positive on 05/16/21 Vaccinated x2 (07/27/2020 and 11/02/2020) ***  Health Maintenance Due for colonoscopy, mammogram, pap Advised follow up with PCP  PERTINENT  PMH / PSH: asthma, COPD, anxiety  OBJECTIVE:   There were no vitals taken for this visit.  ***  ASSESSMENT/PLAN:   No problem-specific Assessment & Plan notes found for this encounter.   Patient has risk factors for severe disease (hx asthma and COPD, elevated BMI?, not boosted), symptom onset within 5 days ***, no known renal or hepatic impairment,  No med interactions (no statin,  Paxlovid 300-100mg  BID x5 days  Maury Dus, MD [facility]

## 2021-05-19 ENCOUNTER — Ambulatory Visit: Payer: Self-pay

## 2021-05-19 NOTE — Telephone Encounter (Signed)
Patient calls nurse line requesting samples of Trelegy inhalers.   Patient is recently COVID positive. Advised patient that if we do have samples available we would need to meet her at her car to dispense them to her. Patient is agreeable with that plan.   Please advise if samples can be provided to patient.   [name], RN

## 2021-05-20 NOTE — Telephone Encounter (Signed)
Called patient. Patient will come to office to pick up sample. Instructed patient to call when in parking lot.   Trelegy Ellipta LOT: [id number] Exp Date: 09/2022  [name], RN

## 2021-05-25 ENCOUNTER — Other Ambulatory Visit: Payer: Self-pay

## 2021-06-01 ENCOUNTER — Other Ambulatory Visit: Payer: Self-pay

## 2021-06-06 ENCOUNTER — Encounter: Payer: Self-pay | Admitting: Student

## 2021-06-06 ENCOUNTER — Ambulatory Visit (INDEPENDENT_AMBULATORY_CARE_PROVIDER_SITE_OTHER): Payer: Self-pay | Admitting: Student

## 2021-06-06 ENCOUNTER — Other Ambulatory Visit: Payer: Self-pay

## 2021-06-06 VITALS — BP 150/81 | HR 102 | Wt 173.0 lb

## 2021-06-06 DIAGNOSIS — J4551 Severe persistent asthma with (acute) exacerbation: Secondary | ICD-10-CM

## 2021-06-06 MED ORDER — PREDNISONE 50 MG PO TABS: ORAL_TABLET | ORAL | 0 refills | Status: DC

## 2021-06-06 MED ORDER — TRELEGY ELLIPTA 100-62.5-25 MCG/INH IN AEPB: 1.0000 | INHALATION_SPRAY | Freq: Once | RESPIRATORY_TRACT | 0 refills | Status: AC

## 2021-06-06 MED ORDER — ALBUTEROL SULFATE (2.5 MG/3ML) 0.083% IN NEBU
2.5000 mg | INHALATION_SOLUTION | Freq: Once | RESPIRATORY_TRACT | Status: AC
  Administered 2021-06-06: 2.5 mg via RESPIRATORY_TRACT

## 2021-06-06 MED ORDER — TRIAMCINOLONE ACETONIDE 40 MG/ML IJ SUSP
40.0000 mg | Freq: Once | INTRAMUSCULAR | Status: AC
  Administered 2021-06-06: 40 mg via INTRAMUSCULAR

## 2021-06-06 MED ORDER — IPRATROPIUM BROMIDE 0.02 % IN SOLN
0.5000 mg | Freq: Once | RESPIRATORY_TRACT | Status: AC
  Administered 2021-06-06: 0.5 mg via RESPIRATORY_TRACT

## 2021-06-06 NOTE — Patient Instructions (Addendum)
It was great to see you! Thank you for allowing me to participate in your care!   I recommend that you always bring your medications to each appointment as this makes it easy to ensure we are on the correct medications and helps [place] not miss when refills are needed.  Our plans for today:  - We gave you a breathing treatment in clinic to make your breathing feel better -We also gave you a steroid injection to make you feel better sooner - We are providing a sample of Trelegy for you in case you won't be able to make it to the clinic today - Please take your prednisone that I have sent to your [facility] for 5 days - Please go to the ED if your asthma is worsened or you are breathing faster, your albuterol and controller medications are not working, or you feel uncontrolled. I have attached a form above as well.  Take care and seek immediate care sooner if you develop any concerns.   Levin Erp, MD [facility]

## 2021-06-06 NOTE — Assessment & Plan Note (Signed)
-  gave trelegy sample in clinic today -sent prednisone 50 mg for 5 days to [facility] -gave albuterol/ipratropium nebulizer treatment in clinic today -gave kenalog-40 in clinic today -gave strict return precautions for asthma in AVS and told her to go to ED if worsening

## 2021-06-06 NOTE — Progress Notes (Signed)
    SUBJECTIVE:   CHIEF COMPLAINT / HPI: Asthma Flare  Asthma Exacerbation Has had previous intubation x2 and been admitted to the hospital for asthma exacerbations. Thurs-Friday went on an outdoor retreat. For 2 days has had shortness of breath. Picking up trelegy today at the wellness clinic because she had run out. Hasn't been taking the trelegy for 4 days due to unable to refill early. Has been taking albuterol inhaler in past two days, but does not use much when using trelegy. Started using albuterol 2 puffs q3h on Saturday. 1x duoneb this morning. Usually just uses trelegy and singulair daily.Feeling like she has not been able to walk without shortness of breath. Today was really bad and took her an hour to get dressed. O2 at home when she checks was lowest was 92. Has had no fevers, and tested negative for covid this morning. Some coughing after treatments at home and walking. No PFTs charted. Denies fevers.   PERTINENT  PMH / PSH: chronic asthma, mdd, gad  OBJECTIVE:   BP (!) 150/81   Pulse (!) 102   Wt 173 lb (78.5 kg)   SpO2 92%   BMI 27.92 kg/m   Physical Exam Constitutional:      Appearance: She is not toxic-appearing.  HENT:     Head: Normocephalic and atraumatic.  Cardiovascular:     Rate and Rhythm: Normal rate and regular rhythm.     Heart sounds: No murmur heard.   No friction rub. No gallop.  Pulmonary:     Breath sounds: No stridor. Wheezing present. No decreased breath sounds.     Comments: Wheezes present in all lung fields. Increased WOB without suprasternal or supraclavicular retractions. Mild belly breathing Neurological:     Mental Status: She is alert.  Extremities: No pitting edema in lower extremties  ASSESSMENT/PLAN:   Asthma exacerbation -gave trelegy sample in clinic today -sent [facility]one 50 mg for [facility] -gave albuterol/ipratropium nebulizer treatment in clinic today -gave kenalog-40 in clinic today -gave strict return  precautions for asthma in AVS and told her to go to ED if worsening      Levin Erp, MD [facility]

## 2021-06-07 ENCOUNTER — Other Ambulatory Visit: Payer: Self-pay

## 2021-06-08 ENCOUNTER — Other Ambulatory Visit: Payer: Self-pay

## 2021-07-05 NOTE — Progress Notes (Signed)
Received notification from [employer] regarding approval for TRELEGY 100/62.5. Patient assistance approved from 06/29/21 to 06/28/22.  MEDICATION WILL SHIP TO PT'S HOME  Phone: [phone]

## 2021-08-01 ENCOUNTER — Other Ambulatory Visit: Payer: Self-pay

## 2021-08-05 ENCOUNTER — Telehealth: Payer: Self-pay

## 2021-08-05 NOTE — Telephone Encounter (Signed)
Patient calls nurse line requesting antibiotic and prednisone. Advised patient that she would need an appointment prior to receiving these medications.   Patient reports low grade fever (99.4), nasal congestion, cough, no wheezing, no difficulty breathing. Patient has been using nebulizer and inhalers as needed. Home COVID tests were negative.   Offered patient appointment for next available on Tuesday, 10/18. Patient states that she cannot wait that long and she will end up in the hospital before then.   Patient is requesting returned phone call from provider to discuss further.   Patient aware of UC/ED precautions.   [name], RN

## 2021-08-05 NOTE — Telephone Encounter (Signed)
I discussed this with Dr. McDiarmid who stated that she would need to be seen in office for antibiotics and/or steroids. If she needs this sooner, would have her go to UC/ED. Please let her know, thanks!

## 2021-08-05 NOTE — Telephone Encounter (Signed)
Patient called and informed of below. Patient will seek evaluation in UC.   [name], RN

## 2021-08-12 ENCOUNTER — Other Ambulatory Visit: Payer: Self-pay

## 2021-08-12 ENCOUNTER — Ambulatory Visit (HOSPITAL_COMMUNITY)
Admission: EM | Admit: 2021-08-12 | Discharge: 2021-08-12 | Disposition: A | Discharge status: Home / Self Care | Payer: Self-pay | Attending: Internal Medicine | Admitting: Internal Medicine

## 2021-08-12 ENCOUNTER — Encounter (HOSPITAL_COMMUNITY): Payer: Self-pay

## 2021-08-12 ENCOUNTER — Ambulatory Visit (INDEPENDENT_AMBULATORY_CARE_PROVIDER_SITE_OTHER): Payer: Self-pay

## 2021-08-12 DIAGNOSIS — J4541 Moderate persistent asthma with (acute) exacerbation: Secondary | ICD-10-CM

## 2021-08-12 DIAGNOSIS — R110 Nausea: Secondary | ICD-10-CM

## 2021-08-12 DIAGNOSIS — J189 Pneumonia, unspecified organism: Secondary | ICD-10-CM

## 2021-08-12 DIAGNOSIS — R0602 Shortness of breath: Secondary | ICD-10-CM

## 2021-08-12 MED ORDER — PREDNISONE 10 MG (21) PO TBPK: ORAL_TABLET | Freq: Every day | ORAL | 0 refills | Status: DC

## 2021-08-12 MED ORDER — ONDANSETRON 8 MG PO TBDP: 8.0000 mg | ORAL_TABLET | Freq: Three times a day (TID) | ORAL | 0 refills | Status: DC | PRN

## 2021-08-12 MED ORDER — AMOXICILLIN-POT CLAVULANATE 875-125 MG PO TABS: 1.0000 | ORAL_TABLET | Freq: Two times a day (BID) | ORAL | 0 refills | Status: DC

## 2021-08-12 MED ORDER — AZITHROMYCIN 250 MG PO TABS: 250.0000 mg | ORAL_TABLET | Freq: Every day | ORAL | 0 refills | Status: DC
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
## 2021-08-12 NOTE — Discharge Instructions (Addendum)
-  Start the antibiotic-Augmentin (amoxicillin-clavulanate), 1 pill every 12 hours for 7 days.  You can take this with food like with breakfast and dinner. -Zpack x5 days -Prednisone taper for asthma/cough/bronchitis. I recommend taking this in the morning as it could give you energy.  Avoid NSAIDs like ibuprofen and alleve while taking this medication as they can increase your risk of stomach upset and even GI bleeding when in combination with a steroid. You can continue tylenol (acetaminophen) up to 1000mg  3x daily. -Take the Zofran (ondansetron) up to 3 times daily for nausea and vomiting. Dissolve one pill under your tongue or between your teeth and your cheek. -Come back and see if symptoms getting worse instead of better, like shortness of breath, fevers, weakness, dizziness, etc.

## 2021-08-12 NOTE — ED Triage Notes (Signed)
Pt c/o cough x1wk that's now productive with green sputum. States having SOB x2 days. Hx of asthma, using her inhaler and neb tx's with short relief. Pt appears SOB but now speaking in complete sentences. No distress noted at this time. States have had 7 neg home covid test.

## 2021-09-17 ENCOUNTER — Other Ambulatory Visit: Payer: Self-pay | Admitting: Family Medicine

## 2021-11-09 ENCOUNTER — Other Ambulatory Visit: Payer: Self-pay | Admitting: Family Medicine

## 2022-01-11 ENCOUNTER — Ambulatory Visit (INDEPENDENT_AMBULATORY_CARE_PROVIDER_SITE_OTHER): Payer: Self-pay | Admitting: Student

## 2022-01-11 ENCOUNTER — Other Ambulatory Visit: Payer: Self-pay

## 2022-01-11 ENCOUNTER — Ambulatory Visit
Admission: RE | Admit: 2022-01-11 | Discharge: 2022-01-11 | Disposition: A | Discharge status: Home / Self Care | Payer: Self-pay | Source: Ambulatory Visit | Attending: Family Medicine | Admitting: Family Medicine

## 2022-01-11 ENCOUNTER — Encounter: Payer: Self-pay | Admitting: Student

## 2022-01-11 VITALS — BP 87/70 | HR 92 | Wt 166.4 lb

## 2022-01-11 DIAGNOSIS — R059 Cough, unspecified: Secondary | ICD-10-CM

## 2022-01-11 NOTE — Patient Instructions (Signed)
It was great to see you! Thank you for allowing me to participate in your care! ? ?We are concerned that you may have a pneumonia and want to get imaging to rule this out.  ? ?Our plans for today:  ?- Chest X-Ray ?- Seek medical care if: ? Develop fever greater than 100.4 degrees F ? Develop shortness of breath, difficulty breathing ? Develop increased sputum production/change in color ? Develop wheezing difficulty breathing, chest tightness ? ?Take care and seek immediate care sooner if you develop any concerns.  ? ?Dr. Bess Kinds, MD ?[facility] ? ?

## 2022-01-11 NOTE — Progress Notes (Signed)
?  SUBJECTIVE:  ? ?CHIEF COMPLAINT / HPI:  ? ?Cold, congestion, asthma ? ?Having a lot of congestion, no asthma symptoms, stiffness in joints, with body aches, and some nausea. No fever or weightloss. Also having some insomnia. Congestion has been an issues for a couple of weeks, started off with a cold that cleared, cough stayed. Congestion continues to get worse. Congestion is mostly in chest, with productive cough. Clear and thick sputum. No SOB or wheezing. Had some chest pain with cough. Fatigue feels like she has no energy when walking, needs to rest to catch up. No issues with eyes, redness or itchy. Body aches in lower back and hips lasting about a week with stiff legs. Insomnia lasting 3-4 months, averaging 4 hours of sleep a night. Nausea without vomitting for about 4 months, issues eating foods with certain textures.  ? ?Meds: Trellegy, duonebs, albuterol, montelukast ?Hx of Severe persistent Asthma with exacerbation 1 year ago 01/21/21 ? ?Issues for PCP visit: ?Pap smear, Colonoscopy, Tdap, Covid, issues eating good regular meals ?-Plans to get it in April visit. ? ? ? ?PERTINENT  PMH / PSH: Asthma, COPD ? ? ?OBJECTIVE:  ?BP (!) 87/70   Pulse 92   Wt 166 lb 6.4 oz (75.5 kg)   SpO2 96%   BMI 26.86 kg/m?  ? ?General: NAD, pleasant, able to participate in exam ?HEENT: No cervical lymphadenopathy, no swelling, oropharynx pink and moist, no exudate, TM visualized and normal, left nares slightly erythematous.  ?Cardiac: RRR, no murmurs auscultated. ?Respiratory: CTAB, normal effort, no wheezes, rales or rhonchi ?Abdomen: soft, non-tender, non-distended, normoactive bowel sounds ?Extremities: warm and well perfused, no edema or cyanosis. ?Skin: warm and dry, no rashes noted ?Neuro: alert, no obvious focal deficits, speech normal ?Psych: Normal affect and mood ? ?ASSESSMENT/PLAN:  ? ?Productive Cough  ?Patient with cough lasting 2-3 weeks, s/p cold. Cough is productive of thick white sputum. Patient also had  some congestion, body aches and sleep disturbances. Body aches resolved but patient continues to have congestion. Symptoms and history concerning for COPD exacerbation, but patient has no increased work of breathing, but does have increased production of sputum. Patient could also be suffering from atypical pneumonia. Will obtain chest x-ray to rule out. Patient could also be suffering from allergies, although this less likely given the body aches and sleep disturbances. Sleep disturbance has been issue for months, and may not be related to present sick episode. ?-CXR, if positive for atypical pneumonia will start azithromycin ?-Return precautions reviewed ? ?Issues for PCP visit: ?Pap smear, Colonoscopy, Tdap, Covid, issues eating good regular meals ?-Plans to follow up at visit in April. ? ?Orders Placed This Encounter  ?Procedures  ? DG Chest 2 View  ?  Standing Status:   Future  ?  Number of Occurrences:   1  ?  Standing Expiration Date:   01/12/2023  ?  Order Specific Question:   Reason for Exam (SYMPTOM  OR DIAGNOSIS REQUIRED)  ?  Answer:   productive cough  ?  Order Specific Question:   Preferred imaging location?  ?  Answer:   [id number]  ? ?No orders of the defined types were placed in this encounter. ? ?No follow-ups on file. ?@SIGNNOTE @ ? ?

## 2022-01-16 ENCOUNTER — Other Ambulatory Visit: Payer: Self-pay | Admitting: Family Medicine

## 2022-01-18 ENCOUNTER — Telehealth: Payer: Self-pay

## 2022-01-18 NOTE — Telephone Encounter (Signed)
Patient calls nurse line requesting Trelegy samples.  ? ?Patient reports she gets her medication from the manufacture, however there will be a delay of ~1 week. ? ?Patent is requesting 1-2 samples. ? ?Confirmed medication and dosing on current medication list.  ? ?Will forward to pharmacy team for approval.  ?

## 2022-01-19 ENCOUNTER — Telehealth: Payer: Self-pay

## 2022-01-19 NOTE — Telephone Encounter (Signed)
Noted and agree. 

## 2022-01-19 NOTE — Telephone Encounter (Signed)
Returned call to patient.  She reports being out of her Trelegy for approximately 2 weeks.  She reports her breathing has been problematic recently due to shortness of breath and wheezing. She has been using a morning nebulizer treatment which helps but she notices her breathing deteriorates by evening.  ? ?Medication Samples have been provided to the patient. ? ?Drug name: Trelegy       Strength: 200/62.5/25mcg        Qty: 2 inhalers (28 day supply)  LOT: [id number]  Exp.Date: 03/23/2023 ? ?Dosing instructions: 1 inhalation daily ? ?The patient has been instructed regarding the correct time, dose, and frequency of taking this medication, including desired effects and most common side effects.  ? ?Lydia Griffin ?9:43 AM ?01/19/2022 ? ?

## 2022-01-19 NOTE — Telephone Encounter (Signed)
Patient calls nurse line reporting "low grade" fevers over the last couple of nights.  ? ?Patient reports temps ranging from 99.0-99.8. Patient reports no other symptoms associated except for a cough. Patient had a chest xray earlier this week and normal. Patient reports negative covid test. ? ?Patient advised to drink plenty of fluids and to use tylenol/motrin as needed.  ? ?Patient advised to monitor symptoms and to call for an apt if symptoms worsen.  ? ?Will forward to PCP for further recommendations.  ?

## 2022-02-01 ENCOUNTER — Ambulatory Visit: Payer: Self-pay | Admitting: Family Medicine

## 2022-02-02 ENCOUNTER — Ambulatory Visit: Payer: Self-pay

## 2022-02-07 ENCOUNTER — Ambulatory Visit: Payer: Self-pay | Admitting: Family Medicine

## 2022-02-13 ENCOUNTER — Ambulatory Visit (INDEPENDENT_AMBULATORY_CARE_PROVIDER_SITE_OTHER): Payer: Self-pay | Admitting: Family Medicine

## 2022-02-13 ENCOUNTER — Encounter: Payer: Self-pay | Admitting: Family Medicine

## 2022-02-13 ENCOUNTER — Other Ambulatory Visit (HOSPITAL_COMMUNITY)
Admission: RE | Admit: 2022-02-13 | Discharge: 2022-02-13 | Disposition: A | Discharge status: Home / Self Care | Payer: Self-pay | Source: Ambulatory Visit | Attending: Family Medicine | Admitting: Family Medicine

## 2022-02-13 VITALS — BP 127/74 | HR 77 | Ht 66.0 in | Wt 160.8 lb

## 2022-02-13 DIAGNOSIS — Z1231 Encounter for screening mammogram for malignant neoplasm of breast: Secondary | ICD-10-CM | POA: Insufficient documentation

## 2022-02-13 DIAGNOSIS — Z0000 Encounter for general adult medical examination without abnormal findings: Secondary | ICD-10-CM

## 2022-02-13 DIAGNOSIS — Z1211 Encounter for screening for malignant neoplasm of colon: Secondary | ICD-10-CM

## 2022-02-13 NOTE — Patient Instructions (Signed)
It was great seeing you today! ? ?Today we did you pap smear. I will call you if anything is abnormal, and will send my chart message if normal.  ? ?I also provided you with the numbers to call to schedule your mammogram and your colonoscopy. ? ?I also recommend getting your shingles vaccine and Tdap from the pharmacy. ? ?Feel free to call with any questions or concerns at any time, at [phone]. ?  ?Take care,  ?Dr. Cora Collum ?[facility]  ?

## 2022-02-13 NOTE — Progress Notes (Signed)
? ? ? ?  SUBJECTIVE:  ? ? ?Ms. Filice is a 61 yo who presents for her annual exam and pap smear. Denies any vaginal or urinary complaints. Not currently sexually active so not desiring STI testing at this time.  ? ?Health maintenance ?Has not had colonoscopy. Fearful of bowel perforation  ?Has not had mammogram ? ?Denies tobacco use or alcohol, does smoke marijuana about 3 times a week.  ? ? ?OBJECTIVE:  ? ?BP 127/74   Pulse 77   Ht 5\' 6"  (1.676 m)   Wt 160 lb 12.8 oz (72.9 kg)   SpO2 99%   BMI 25.95 kg/m?   ? ?Physical exam ?General: well appearing, NAD ?Cardiovascular: RRR, no murmurs ?Lungs: CTAB. Normal WOB ?Abdomen: soft, non-distended, non-tender ?Skin: warm, dry. No edema ?Pelvic exam: normal external genitalia, vulva, vagina, cervix ? ?ASSESSMENT/PLAN:  ? ?No problem-specific Assessment & Plan notes found for this encounter. ?  ? ?Annual Examination  ?See AVS for age appropriate recommendations  ?PHQ score 7, reviewed and discussed.  ?BP reviewed and at goal.  ?Asked about intimate partner violence and patient denies.  ? ?Considered the following items based upon USPSTF recommendations: ?Diabetes screening: A1c normal 1 year ago. Did not check today  ?Screening for elevated cholesterol: will check at next visit  ?HIV testing:  not indicated  ?Hepatitis C:  normal 1 year ago ?Hepatitis B:  not ordered  ?Syphilis if at high risk: Declined  ?GC/CT not at high risk and not ordered. Declined  ?Osteoporosis screening considered based upon risk of fracture from [facility] calculator. Major osteoporotic fracture risk is 7.7%. DEXA not ordered.  ?Reviewed risk factors for latent tuberculosis and not indicated ? ?Cervical cancer screening: due for Pap today, cytology + HPV ordered ?Breast cancer screening: mammogram ordered  ?Colorectal cancer screening: discussed, colonoscopy ordered ?Vaccinations: since patient is self pay, recommended Tdap and shingles vaccines from pharmacy .  ? ?Follow up in 1 year or sooner if  indicated.  ? ? ?[facility], DO ?[facility]   ?

## 2022-02-17 LAB — CYTOLOGY - PAP
Comment: NEGATIVE
Diagnosis: UNDETERMINED — AB
High risk HPV: NEGATIVE

## 2022-02-27 ENCOUNTER — Encounter: Payer: Self-pay | Admitting: Gastroenterology

## 2022-02-28 ENCOUNTER — Other Ambulatory Visit: Payer: Self-pay

## 2022-02-28 DIAGNOSIS — Z1231 Encounter for screening mammogram for malignant neoplasm of breast: Secondary | ICD-10-CM

## 2022-03-21 ENCOUNTER — Ambulatory Visit (AMBULATORY_SURGERY_CENTER): Payer: Self-pay | Admitting: *Deleted

## 2022-03-21 VITALS — Ht 66.0 in | Wt 154.0 lb

## 2022-03-21 DIAGNOSIS — Z1211 Encounter for screening for malignant neoplasm of colon: Secondary | ICD-10-CM

## 2022-03-21 MED ORDER — PLENVU 140 G PO SOLR: 1.0000 | Freq: Once | ORAL | 0 refills | Status: AC

## 2022-03-21 NOTE — Progress Notes (Signed)
Patient is here in-person for PV. Patient denies any allergies to eggs or soy. Patient denies any problems with anesthesia/sedation. Patient is not on any oxygen at home. Patient is not taking any diet/weight loss medications or blood thinners. Patient is aware of our care-partner policy. Pt c/o constipation-pt states she has bleeding hemorrhoids.  Plenvu sample given to pt with 2day prep.   EMMI education assigned to the patient for the procedure, sent to MyChart.

## 2022-03-23 ENCOUNTER — Ambulatory Visit
Admission: RE | Admit: 2022-03-23 | Discharge: 2022-03-23 | Disposition: A | Discharge status: Home / Self Care | Payer: No Typology Code available for payment source | Source: Ambulatory Visit | Attending: Obstetrics and Gynecology | Admitting: Obstetrics and Gynecology

## 2022-03-23 DIAGNOSIS — Z1231 Encounter for screening mammogram for malignant neoplasm of breast: Secondary | ICD-10-CM

## 2022-03-28 ENCOUNTER — Other Ambulatory Visit: Payer: Self-pay | Admitting: Obstetrics and Gynecology

## 2022-03-28 ENCOUNTER — Encounter: Payer: Self-pay | Admitting: *Deleted

## 2022-03-28 DIAGNOSIS — R928 Other abnormal and inconclusive findings on diagnostic imaging of breast: Secondary | ICD-10-CM

## 2022-03-30 ENCOUNTER — Telehealth: Payer: Self-pay

## 2022-03-30 NOTE — Telephone Encounter (Signed)
Patient calls nurse line reporting asthma flare due to air quality.   Patient reports symptoms have been present for ~ 4 days.   Patient reports she has been using her inhalers with no concerns, however feels she may need a course of prednisone.   Patient is speaking in full sentences.   Patient scheduled for tomorrow morning for evaluation.   Strict precautions given.

## 2022-03-31 ENCOUNTER — Ambulatory Visit (INDEPENDENT_AMBULATORY_CARE_PROVIDER_SITE_OTHER): Payer: Self-pay | Admitting: Family Medicine

## 2022-03-31 VITALS — BP 138/86 | HR 66 | Ht 66.0 in | Wt 153.6 lb

## 2022-03-31 DIAGNOSIS — J4541 Moderate persistent asthma with (acute) exacerbation: Secondary | ICD-10-CM

## 2022-03-31 MED ORDER — ALBUTEROL SULFATE (2.5 MG/3ML) 0.083% IN NEBU
2.5000 mg | INHALATION_SOLUTION | Freq: Once | RESPIRATORY_TRACT | Status: AC
  Administered 2022-03-31: 2.5 mg via RESPIRATORY_TRACT

## 2022-03-31 MED ORDER — TRELEGY ELLIPTA 200-62.5-25 MCG/ACT IN AEPB: 1.0000 | INHALATION_SPRAY | Freq: Every day | RESPIRATORY_TRACT | 0 refills | Status: DC

## 2022-03-31 MED ORDER — IPRATROPIUM-ALBUTEROL 0.5-2.5 (3) MG/3ML IN SOLN: 3.0000 mL | Freq: Once | RESPIRATORY_TRACT | Status: DC

## 2022-03-31 MED ORDER — IPRATROPIUM BROMIDE 0.02 % IN SOLN
0.5000 mg | Freq: Once | RESPIRATORY_TRACT | Status: AC
  Administered 2022-03-31: 0.5 mg via RESPIRATORY_TRACT

## 2022-03-31 MED ORDER — PREDNISONE 20 MG PO TABS: 40.0000 mg | ORAL_TABLET | Freq: Every day | ORAL | 0 refills | Status: AC

## 2022-03-31 NOTE — Progress Notes (Addendum)
     SUBJECTIVE:   CHIEF COMPLAINT / HPI:   Lydia Griffin is a 61 y.o. female presents for asthma exacerbation   Asthma exacerbation Pt reports one week hx of dyspnea, wheezing, coughing and congestion. Sputum is light yellow colored. Pt reports this is a typical asthma flare for her. She feels like the weather change has worsened her symptoms. She usually takes albuterol, singular, trelegy and duonebs, endorses compliance. She has been getting trelegy samples from [facility]. She ran out of Trelegy yesterday. Has used home nebulizer twice a day over the past few weeks. Using albuterol 4-5 times a day. Pt has been hospitalized several times due to asthma and intubated twice.  Had PNA 6 months ago but when she had PNA she has chest pain too. Denies sick contacts. Denies fevers, chest pain, dizziness. Asthma flares approx 12 times a year. Has not been seen by a pulmonologist yet.   [place] Office Visit from 03/31/2022 in [place]  PHQ-9 Total Score 4                   PERTINENT  PMH / PSH: Asthma, anxiety, depression  OBJECTIVE:   BP 138/86   Pulse 66   Ht 5\' 6"  (1.676 m)   Wt 153 lb 9.6 oz (69.7 kg)   SpO2 96%   BMI 24.79 kg/m    General: Alert, no acute distress, well-appearing Cardio: Normal S1 and S2, RRR, no r/m/g Pulm: Moderately increased work of breathing, bilateral inspiratory and expiratory wheeze Extremities: No peripheral edema.  Neuro: Cranial nerves grossly intact   ASSESSMENT/PLAN:   Acute asthma exacerbation Symptoms consistent with acute asthma exacerbation.  Patient is able to speak in full sentences and sats 96% on room air which is reassuring.  Patient received DuoNeb's in the clinic today.  Reexamined after DuoNeb's wheezing similar to prior however pt's symptoms were improved.  Provide 1 month supply of Trellegy inhaler, continue 1 puff daily.  Patient is working on getting trilogy inhaler approved by her insurance company. Also  prescribed 5 days of 40 mg prednisone.  Continue Singulair daily, albuterol as needed.  Given multiple exacerbations of asthma, I have referred patient to pulmonology.  Strict ER precautions given to patient who expressed understanding.     [name], MD PGY-3 [place]

## 2022-03-31 NOTE — Patient Instructions (Signed)
Thank you for coming to see me today. It was a pleasure. Today we discussed your asthma exacerbation. I recommend: -Prednisone 40 mg for 5 days -Continue Trelegy 1 puff daily -Continue albuterol as needed, continue Singulair daily -I have referred you to the pulmonologist  If you have worsening symptoms of shortness of breath or wheezing then please go back to the ER immediately  If you have any questions or concerns, please do not hesitate to call the office at [phone].  Best wishes,   Dr Allena Katz

## 2022-03-31 NOTE — Assessment & Plan Note (Addendum)
Symptoms consistent with acute asthma exacerbation.  Patient is able to speak in full sentences and sats 96% on room air which is reassuring.  Patient received DuoNeb's in the clinic today.  Reexamined after DuoNeb's wheezing similar to prior however pt's symptoms were improved.  Provide 1 month supply of Trellegy inhaler, continue 1 puff daily.  Patient is working on getting trilogy inhaler approved by her insurance company. Also prescribed 5 days of 40 mg prednisone.  Continue Singulair daily, albuterol as needed.  Given multiple exacerbations of asthma, I have referred patient to pulmonology.  Strict ER precautions given to patient who expressed understanding.

## 2022-04-11 ENCOUNTER — Encounter: Payer: Self-pay | Admitting: Gastroenterology

## 2022-04-22 ENCOUNTER — Other Ambulatory Visit: Payer: Self-pay | Admitting: Family Medicine

## 2022-04-28 ENCOUNTER — Encounter: Payer: Self-pay | Admitting: Gastroenterology

## 2022-05-04 ENCOUNTER — Ambulatory Visit: Payer: No Typology Code available for payment source

## 2022-05-05 ENCOUNTER — Institutional Professional Consult (permissible substitution): Payer: No Typology Code available for payment source | Admitting: Internal Medicine

## 2022-05-11 ENCOUNTER — Other Ambulatory Visit: Payer: No Typology Code available for payment source

## 2022-05-26 ENCOUNTER — Institutional Professional Consult (permissible substitution): Payer: No Typology Code available for payment source | Admitting: Internal Medicine

## 2022-06-02 ENCOUNTER — Telehealth: Payer: Self-pay

## 2022-06-02 NOTE — Telephone Encounter (Signed)
Patient calls nurse line requesting Trelegy samples. Please advise.   [name], RN

## 2022-06-06 NOTE — Telephone Encounter (Signed)
4 boxes of Trelegy set aside for patient, per Dr. Raymondo Band.   Called patient to inform. She did not answer, LVM.   Medication placed in med room for pick up.   [name], RN

## 2022-07-11 ENCOUNTER — Other Ambulatory Visit: Payer: Self-pay

## 2022-07-11 ENCOUNTER — Ambulatory Visit (INDEPENDENT_AMBULATORY_CARE_PROVIDER_SITE_OTHER): Payer: Self-pay | Admitting: Student

## 2022-07-11 ENCOUNTER — Ambulatory Visit (INDEPENDENT_AMBULATORY_CARE_PROVIDER_SITE_OTHER): Payer: Self-pay | Admitting: Psychology

## 2022-07-11 DIAGNOSIS — F411 Generalized anxiety disorder: Secondary | ICD-10-CM

## 2022-07-11 DIAGNOSIS — F331 Major depressive disorder, recurrent, moderate: Secondary | ICD-10-CM

## 2022-07-11 DIAGNOSIS — F430 Acute stress reaction: Secondary | ICD-10-CM

## 2022-07-11 MED ORDER — SERTRALINE HCL 100 MG PO TABS: 100.0000 mg | ORAL_TABLET | Freq: Every day | ORAL | 1 refills | Status: DC

## 2022-07-11 NOTE — Patient Instructions (Signed)
It was great to see you! Thank you for allowing me to participate in your care!  It looks like you have some depression and anxiety. These can both be treated with an antidepressant. We are going to start you on Zoloft.  Our plans for today:  - Zoloft 100 mg daily - Follow up appointment in 4-6 weeks - Continue therapy weekly  Take care and seek immediate care sooner if you develop any concerns.   Dr. Holley Bouche, MD [place]

## 2022-07-11 NOTE — Assessment & Plan Note (Addendum)
Patient reports months of low activity/interest, not eating, more irritable, and tearful. Reports she knows her mental health isn't well and she want's to fix it. Denies SI/HI and not responding to internal stimuli. Patient reports she tried Zoloft once for 2 weeks, but didn't give it a fair go. She is open and wanting medication today. Patient also wanting to continue with therapy. Patient with social stressors causing her symptoms. Patient MDQ low concern for bipolar disorder, with PHQ9 of 15 and GAD7 of 14. -Therapy weekly -Zoloft 100 mg daily -F/u 4-6 weeks

## 2022-07-11 NOTE — BH Specialist Note (Signed)
Integrated Behavioral Health Follow Up In-Person Visit  MRN: [id number] Name: Lydia Griffin  Number of [facility] Clinician visits: 1- Initial Visit  Session Start time: 3474   Session End time: 1410  Total time in minutes: 35   Types of Service: Individual psychotherapy   Subjective: Lydia GALLICCHIO is a 61 y.o. female  Patient was referred by self for stress. Patient reports the following symptoms/concerns:    Pt reported mood has shifted in past few months. Reports lack of motivation, feeling overwhelmed and tired.  Shared she just quit a stressful job.  She shared it was getting to the point where she felt angry and wanted to fight at her old job but was able to monitor herself.  Denied HI/SI.  Reported had to put her dog down recently.   Reported that she feels her mood is impacting her heath.   Validated insight pt has with emotions and talked about seeking therapist due to provider maternity leave.  Pt is interested in therapy and getting on medication. GAD7 (14) and PHQ9 (15) performed.    Duration of problem: past few months; Severity of problem: moderate  Objective: Mood: Depressed and Affect: Tearful Risk of harm to self or others: No plan to harm self or others  Life Context: Life Changes: quit job, lost dog   Patient and/or Family's Strengths/Protective Factors: Social and Emotional competence  Goals Addressed: Patient will:  Reduce symptoms of: stress: loss of appetite, feeling tired and low motivation; GAD 7 and PHQ9 scores   Increase knowledge and/or ability of: coping skills : connect to therapy and medication management    Progress towards Goals: Ongoing  Interventions: Interventions utilized:  Supportive Counseling, Psychoeducation and/or Health Education, and Link to [employer] Standardized Assessments completed: GAD-7 and PHQ 2&9  Patient and/or Family Response: Pt engaged in treatment planning   Patient  Centered Plan: Patient is on the following Treatment Plan(s): stress treatment plan Assessment: Patient currently experiencing stress due to life transitions.   Patient may benefit from f/u with therapy in community and medication management .  Plan: Follow up with behavioral health clinician on : with doctor for medication Behavioral recommendations: f/u with therapist and provider for medication management  Referral(s): Community Resources:  therapy   Erlinda Hong, PhD., LMFT

## 2022-07-11 NOTE — Patient Instructions (Signed)
Outpatient Mental Health Providers (No Insurance required or Self Pay)  [facility]  [phone] [email or web address] [place] [facility] and [facility]  [address] [place], [place] Crisis [phone]  [facility]) can see uninsured folks for outpatient therapy [email or web address] [address] [place], [place] [address] [phone]  [place] Mon-Fri, 8am-3pm [email or web address] [place], [place]   [place] [phone] [facility] for psych med management, there may be a wait- if [facility] is working with clients for OPT, they will coordinate with [place] for [place]   Walk-in-Clinic: Monday- Friday 9:00 AM - 4:00 PM [place], [place] [phone]  [facility] [place] ([employer]) walk in M-F 8am-12pm and  1pm-3pm [place]- [place]  [place]  Phone: [phone]  [facility] ([place] and substance challenges) [address], [place] [phone]    [email or web address], [place]     Phone:  [phone] [place]  [phone]    [facility] ( virtual or zoom therapy) [email or web address]  [place]  [place]    [facility] [phone]  grief counseling, dementia and caregiver support    [facility] Walk-in MWF 12:30 to 3:00     [place] [address]  [phone]  [email or web address].[email or web address] call to schedule an appointment     National [employer] on Mental Illness (NAMI) [place]- Wellness classes, Support groups        [address],  [place], [place] [address] [phone]   CurrentJokes.cz   [facility]  (Psycho-social Rehabilitation clubhouse, Individual and group therapy) [address] [place], [place] [address]   [phone]  24- Hour Availability:  *[place] or [phone] * [facility] [employer] (Domestic Violence, Rape, etc. )[phone] [place] [phone] or [phone] * [place] [phone] only) [phone] (after hours) *[facility] Mobile Crisis Unit [phone] *[place] National Suicide Hotline [phone] Diamantina Monks)
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
## 2022-07-11 NOTE — Assessment & Plan Note (Addendum)
Patient reports increased worries with many thoughts about circumstance and current health/meantal health. Patient wanting to start medicine for symptoms. GAD7 of 14 -Zoloft 100 mg, daily -Therapy weekly - f/u 4-6 months

## 2022-07-14 ENCOUNTER — Ambulatory Visit: Payer: No Typology Code available for payment source | Admitting: Family Medicine

## 2022-07-21 ENCOUNTER — Encounter: Payer: Self-pay | Admitting: Family Medicine

## 2022-07-21 ENCOUNTER — Ambulatory Visit (INDEPENDENT_AMBULATORY_CARE_PROVIDER_SITE_OTHER): Payer: Self-pay | Admitting: Family Medicine

## 2022-07-21 VITALS — BP 120/70 | HR 73 | Temp 98.1°F | Ht 66.0 in | Wt 155.8 lb

## 2022-07-21 DIAGNOSIS — Z23 Encounter for immunization: Secondary | ICD-10-CM

## 2022-07-21 DIAGNOSIS — F331 Major depressive disorder, recurrent, moderate: Secondary | ICD-10-CM

## 2022-07-21 NOTE — Patient Instructions (Addendum)
It was great seeing you today!  Im glad to see you are doing well and the Zoloft is working well for you!  Today you also got your flu vaccine    Feel free to call with any questions or concerns at any time, at [phone].   Take care,  Dr. Shary Key [facility]

## 2022-07-21 NOTE — Progress Notes (Unsigned)
    SUBJECTIVE:   CHIEF COMPLAINT / HPI:   Patient was seen in the clinic on 9/19 for her MDD and was started on Zoloft 100 mg daily.  She has also been doing therapy with Dr. Hartford Poli.  Feeling improvement in mood, moving around more. Has been eating more.  Having some constipation for the past couple of weeks. Has been having some cramps. Last BM on Wed and normally can go daily. Denies blood in stool  Due for colonoscopy but waiting for insurance   Has appointment with [facility] pulmonary which she will see after insurance changes   PERTINENT  PMH / PSH: Reviewed   OBJECTIVE:   BP 120/70   Pulse 73   Temp 98.1 F (36.7 C)   Ht 5\' 6"  (1.676 m)   Wt 155 lb 12.8 oz (70.7 kg)   SpO2 96%   BMI 25.15 kg/m    Physical exam General: well appearing, NAD Cardiovascular: RRR, no murmurs Lungs: CTAB. Normal WOB Abdomen: soft, non-distended, non-tender Skin: warm, dry. No edema Psych: mood and affect appropriate. Normal thought content. Speech non pressured   ASSESSMENT/PLAN:   Moderate episode of recurrent major depressive disorder ([place]) PHQ9 today 9 down from 19 on 9/19. Denies SI/HI. Currently in therapy with Dr. Hartford Poli. Was started on Zoloft 100mg  at last visit and has been tolerating it well. Feels improvement in mood and appetite. Discussed that the addition of Zoloft at the high dose could possibly be contributing to constipation but she feels it is manageable and would like to continue this dose.   Health maintenance Received flu vaccine   [facility]

## 2022-07-25 NOTE — Assessment & Plan Note (Signed)
PHQ9 today 9 down from 19 on 9/19. Denies SI/HI. Currently in therapy with Dr. Hartford Poli. Was started on Zoloft 100mg  at last visit and has been tolerating it well. Feels improvement in mood and appetite. Discussed that the addition of Zoloft at the high dose could possibly be contributing to constipation but she feels it is manageable and would like to continue this dose.

## 2022-08-14 ENCOUNTER — Emergency Department (HOSPITAL_COMMUNITY): Payer: No Typology Code available for payment source

## 2022-08-14 ENCOUNTER — Encounter (HOSPITAL_COMMUNITY): Payer: Self-pay | Admitting: Family Medicine

## 2022-08-14 ENCOUNTER — Other Ambulatory Visit: Payer: Self-pay

## 2022-08-14 ENCOUNTER — Observation Stay (HOSPITAL_COMMUNITY)
Admission: EM | Admit: 2022-08-14 | Discharge: 2022-08-16 | Disposition: A | Discharge status: Home / Self Care | Payer: No Typology Code available for payment source | Attending: Family Medicine | Admitting: Family Medicine

## 2022-08-14 DIAGNOSIS — R112 Nausea with vomiting, unspecified: Secondary | ICD-10-CM

## 2022-08-14 DIAGNOSIS — J9600 Acute respiratory failure, unspecified whether with hypoxia or hypercapnia: Principal | ICD-10-CM | POA: Insufficient documentation

## 2022-08-14 DIAGNOSIS — J45909 Unspecified asthma, uncomplicated: Secondary | ICD-10-CM | POA: Insufficient documentation

## 2022-08-14 DIAGNOSIS — J449 Chronic obstructive pulmonary disease, unspecified: Secondary | ICD-10-CM

## 2022-08-14 DIAGNOSIS — Z1152 Encounter for screening for COVID-19: Secondary | ICD-10-CM | POA: Insufficient documentation

## 2022-08-14 DIAGNOSIS — R197 Diarrhea, unspecified: Secondary | ICD-10-CM | POA: Insufficient documentation

## 2022-08-14 DIAGNOSIS — E876 Hypokalemia: Secondary | ICD-10-CM | POA: Insufficient documentation

## 2022-08-14 DIAGNOSIS — J9601 Acute respiratory failure with hypoxia: Secondary | ICD-10-CM

## 2022-08-14 DIAGNOSIS — J439 Emphysema, unspecified: Secondary | ICD-10-CM

## 2022-08-14 DIAGNOSIS — Z79899 Other long term (current) drug therapy: Secondary | ICD-10-CM | POA: Insufficient documentation

## 2022-08-14 DIAGNOSIS — J189 Pneumonia, unspecified organism: Secondary | ICD-10-CM

## 2022-08-14 LAB — CBC WITH DIFFERENTIAL/PLATELET
Abs Immature Granulocytes: 0.04 10*3/uL (ref 0.00–0.07)
Basophils Absolute: 0 10*3/uL (ref 0.0–0.1)
Basophils Relative: 0 %
Eosinophils Absolute: 0.1 10*3/uL (ref 0.0–0.5)
Eosinophils Relative: 1 %
HCT: 38.9 % (ref 36.0–46.0)
Hemoglobin: 13.6 g/dL (ref 12.0–15.0)
Immature Granulocytes: 0 %
Lymphocytes Relative: 10 %
Lymphs Abs: 1.2 10*3/uL (ref 0.7–4.0)
MCH: 28.9 pg (ref 26.0–34.0)
MCHC: 35 g/dL (ref 30.0–36.0)
MCV: 82.6 fL (ref 80.0–100.0)
Monocytes Absolute: 1.1 10*3/uL — ABNORMAL HIGH (ref 0.1–1.0)
Monocytes Relative: 9 %
Neutro Abs: 10.2 10*3/uL — ABNORMAL HIGH (ref 1.7–7.7)
Neutrophils Relative %: 80 %
Platelets: 238 10*3/uL (ref 150–400)
RBC: 4.71 MIL/uL (ref 3.87–5.11)
RDW: 14.1 % (ref 11.5–15.5)
WBC: 12.7 10*3/uL — ABNORMAL HIGH (ref 4.0–10.5)
nRBC: 0 % (ref 0.0–0.2)

## 2022-08-14 LAB — BASIC METABOLIC PANEL
Anion gap: 16 — ABNORMAL HIGH (ref 5–15)
BUN: 8 mg/dL (ref 8–23)
CO2: 20 mmol/L — ABNORMAL LOW (ref 22–32)
Calcium: 9 mg/dL (ref 8.9–10.3)
Chloride: 99 mmol/L (ref 98–111)
Creatinine, Ser: 0.84 mg/dL (ref 0.44–1.00)
GFR, Estimated: >60 mL/min (ref >60)
Glucose, Bld: 97 mg/dL (ref 70–99)
Potassium: 3.3 mmol/L — ABNORMAL LOW (ref 3.5–5.1)
Sodium: 135 mmol/L (ref 135–145)

## 2022-08-14 LAB — HEPATIC FUNCTION PANEL
ALT: 46 U/L — ABNORMAL HIGH (ref 0–44)
AST: 46 U/L — ABNORMAL HIGH (ref 15–41)
Albumin: 3.5 g/dL (ref 3.5–5.0)
Alkaline Phosphatase: 131 U/L — ABNORMAL HIGH (ref 38–126)
Bilirubin, Direct: 0.4 mg/dL — ABNORMAL HIGH (ref 0.0–0.2)
Indirect Bilirubin: 1.1 mg/dL — ABNORMAL HIGH (ref 0.3–0.9)
Total Bilirubin: 1.5 mg/dL — ABNORMAL HIGH (ref 0.3–1.2)
Total Protein: 7.3 g/dL (ref 6.5–8.1)

## 2022-08-14 LAB — RESP PANEL BY RT-PCR (FLU A&B, COVID) ARPGX2
Influenza A by PCR: NEGATIVE
Influenza B by PCR: NEGATIVE
SARS Coronavirus 2 by RT PCR: NEGATIVE

## 2022-08-14 LAB — LACTATE DEHYDROGENASE: LDH: 376 U/L — ABNORMAL HIGH (ref 98–192)

## 2022-08-14 LAB — LIPASE, BLOOD: Lipase: 28 U/L (ref 11–51)

## 2022-08-14 MED ORDER — IPRATROPIUM-ALBUTEROL 0.5-2.5 (3) MG/3ML IN SOLN
3.0000 mL | RESPIRATORY_TRACT | Status: DC
  Administered 2022-08-14 – 2022-08-16 (×12): 3 mL via RESPIRATORY_TRACT
  Filled 2022-08-14 (×11): qty 3

## 2022-08-14 MED ORDER — IPRATROPIUM-ALBUTEROL 0.5-2.5 (3) MG/3ML IN SOLN
3.0000 mL | RESPIRATORY_TRACT | Status: AC
  Administered 2022-08-14: 3 mL via RESPIRATORY_TRACT
  Filled 2022-08-14: qty 3

## 2022-08-14 MED ORDER — AZITHROMYCIN 500 MG PO TABS
500.0000 mg | ORAL_TABLET | Freq: Every day | ORAL | Status: AC
  Administered 2022-08-15 – 2022-08-16 (×2): 500 mg via ORAL
  Filled 2022-08-14: qty 2
  Filled 2022-08-14: qty 1

## 2022-08-14 MED ORDER — UMECLIDINIUM BROMIDE 62.5 MCG/ACT IN AEPB
1.0000 | INHALATION_SPRAY | Freq: Every day | RESPIRATORY_TRACT | Status: DC
  Administered 2022-08-16: 1 via RESPIRATORY_TRACT
  Filled 2022-08-14: qty 7

## 2022-08-14 MED ORDER — LACTATED RINGERS IV BOLUS
1000.0000 mL | Freq: Once | INTRAVENOUS | Status: AC
  Administered 2022-08-14: 1000 mL via INTRAVENOUS

## 2022-08-14 MED ORDER — ACETAMINOPHEN 325 MG PO TABS
650.0000 mg | ORAL_TABLET | Freq: Four times a day (QID) | ORAL | Status: DC | PRN
  Administered 2022-08-14: 650 mg via ORAL
  Filled 2022-08-14: qty 2

## 2022-08-14 MED ORDER — LACTATED RINGERS IV SOLN: INTRAVENOUS | Status: DC

## 2022-08-14 MED ORDER — ADULT MULTIVITAMIN W/MINERALS CH
1.0000 | ORAL_TABLET | Freq: Every day | ORAL | Status: DC
  Administered 2022-08-14 – 2022-08-16 (×3): 1 via ORAL
  Filled 2022-08-14 (×3): qty 1

## 2022-08-14 MED ORDER — PREDNISONE 20 MG PO TABS
40.0000 mg | ORAL_TABLET | Freq: Every day | ORAL | Status: DC
  Administered 2022-08-15 – 2022-08-16 (×2): 40 mg via ORAL
  Filled 2022-08-14 (×2): qty 2

## 2022-08-14 MED ORDER — METHYLPREDNISOLONE SODIUM SUCC 125 MG IJ SOLR
125.0000 mg | Freq: Once | INTRAMUSCULAR | Status: AC
  Administered 2022-08-14: 125 mg via INTRAVENOUS
  Filled 2022-08-14: qty 2

## 2022-08-14 MED ORDER — FLUTICASONE PROPIONATE 50 MCG/ACT NA SUSP
2.0000 | Freq: Every day | NASAL | Status: DC
  Administered 2022-08-16: 2 via NASAL
  Filled 2022-08-14: qty 16

## 2022-08-14 MED ORDER — ENOXAPARIN SODIUM 40 MG/0.4ML IJ SOSY
40.0000 mg | PREFILLED_SYRINGE | INTRAMUSCULAR | Status: DC
  Administered 2022-08-14 – 2022-08-15 (×2): 40 mg via SUBCUTANEOUS
  Filled 2022-08-14 (×2): qty 0.4

## 2022-08-14 MED ORDER — SODIUM CHLORIDE 0.9 % IV SOLN
500.0000 mg | Freq: Once | INTRAVENOUS | Status: AC
  Administered 2022-08-14: 500 mg via INTRAVENOUS
  Filled 2022-08-14: qty 5

## 2022-08-14 MED ORDER — FLUTICASONE FUROATE-VILANTEROL 200-25 MCG/ACT IN AEPB
1.0000 | INHALATION_SPRAY | Freq: Every day | RESPIRATORY_TRACT | Status: DC
  Administered 2022-08-16: 1 via RESPIRATORY_TRACT
  Filled 2022-08-14 (×2): qty 28

## 2022-08-14 MED ORDER — ALBUTEROL SULFATE (2.5 MG/3ML) 0.083% IN NEBU: 2.5000 mg | INHALATION_SOLUTION | RESPIRATORY_TRACT | Status: DC | PRN

## 2022-08-14 MED ORDER — MONTELUKAST SODIUM 10 MG PO TABS
10.0000 mg | ORAL_TABLET | Freq: Every day | ORAL | Status: DC
  Administered 2022-08-14 – 2022-08-15 (×2): 10 mg via ORAL
  Filled 2022-08-14 (×2): qty 1

## 2022-08-14 MED ORDER — ONDANSETRON HCL 4 MG/2ML IJ SOLN
4.0000 mg | Freq: Once | INTRAMUSCULAR | Status: AC
  Administered 2022-08-14: 4 mg via INTRAVENOUS
  Filled 2022-08-14: qty 2

## 2022-08-14 MED ORDER — SODIUM CHLORIDE 0.9 % IV SOLN
1.0000 g | Freq: Once | INTRAVENOUS | Status: AC
  Administered 2022-08-14: 1 g via INTRAVENOUS
  Filled 2022-08-14: qty 10

## 2022-08-14 MED ORDER — POTASSIUM CHLORIDE CRYS ER 20 MEQ PO TBCR
20.0000 meq | EXTENDED_RELEASE_TABLET | Freq: Once | ORAL | Status: AC
  Administered 2022-08-14: 20 meq via ORAL
  Filled 2022-08-14: qty 1

## 2022-08-14 MED ORDER — IPRATROPIUM-ALBUTEROL 0.5-2.5 (3) MG/3ML IN SOLN
3.0000 mL | RESPIRATORY_TRACT | Status: AC
  Administered 2022-08-14 (×2): 3 mL via RESPIRATORY_TRACT
  Filled 2022-08-14 (×2): qty 3

## 2022-08-14 MED ORDER — IOHEXOL 350 MG/ML SOLN
100.0000 mL | Freq: Once | INTRAVENOUS | Status: AC | PRN
  Administered 2022-08-14: 75 mL via INTRAVENOUS

## 2022-08-14 MED ORDER — SODIUM CHLORIDE 0.9 % IV SOLN
2.0000 g | INTRAVENOUS | Status: DC
  Administered 2022-08-15 – 2022-08-16 (×2): 2 g via INTRAVENOUS
  Filled 2022-08-14 (×2): qty 20

## 2022-08-14 MED ORDER — MELATONIN 3 MG PO TABS
3.0000 mg | ORAL_TABLET | Freq: Every day | ORAL | Status: DC
  Administered 2022-08-14 – 2022-08-15 (×2): 3 mg via ORAL
  Filled 2022-08-14 (×2): qty 1

## 2022-08-14 MED ORDER — PROCHLORPERAZINE MALEATE 5 MG PO TABS
5.0000 mg | ORAL_TABLET | Freq: Four times a day (QID) | ORAL | Status: DC | PRN
  Administered 2022-08-14: 5 mg via ORAL
  Filled 2022-08-14 (×2): qty 1

## 2022-08-14 MED ORDER — SERTRALINE HCL 100 MG PO TABS
100.0000 mg | ORAL_TABLET | Freq: Every day | ORAL | Status: DC
  Administered 2022-08-14 – 2022-08-16 (×3): 100 mg via ORAL
  Filled 2022-08-14 (×3): qty 1

## 2022-08-14 MED ORDER — ACETAMINOPHEN 500 MG PO TABS
1000.0000 mg | ORAL_TABLET | Freq: Once | ORAL | Status: AC
  Administered 2022-08-14: 1000 mg via ORAL
  Filled 2022-08-14: qty 2

## 2022-08-14 NOTE — ED Triage Notes (Signed)
Pt arrived from home via [facility] c/o nausea, vomiting, diarrhea, abdominal pain, fever, cough, and nasal congestion. This started last Thursday. Pt has been urinating and taking tylenol as needed, however has not had any today. Hx of asthma.  LVS  SpO2 - 92%  HR 90  BP 140/70  CBG 108

## 2022-08-14 NOTE — Assessment & Plan Note (Addendum)
Stable, no episodes since admission. LDH elevated, consider infectious etiology vs hemolysis given normal haptoglobin. Hepatic function otherwise drastically improved, consider lab error vs dehydration as cause. -Continue regular diet as tolerated -Compazine 5 mg Q6H prn -AM CBC, CMP

## 2022-08-14 NOTE — Assessment & Plan Note (Addendum)
K stable. -BMP AM

## 2022-08-14 NOTE — H&P (Addendum)
Hospital Admission History and Physical Service Pager: [phone]  Patient name: Lydia Griffin Medical record number: [id number] Date of Birth: 1961/01/08 Age: 61 y.o. Gender: female  Primary Care Provider: Shary Key, DO Consultants: None Code Status: FULL Preferred Emergency Contact: [place]  Chief Complaint: Shortness of Breath and Abdominal Pain  Assessment and Plan: Lydia Griffin is a 61 y.o. female presenting with SOB and abdominal pain. Differential for this patient's presentation of this includes COPD exacerbation (most likely given history of asthma/possible COPD, acute findings for pneumonia that could have triggered this, and overall exam), viral infection (more likely given GI symptoms in addition to evidence of pneumonia on imaging and exam), alpha-1 antitrypsin deficiency (less likely given only acute rise in hepatic function tests but interesting she has more severe pulmonary disease without history of smoking), pulmonary embolism (less likely given history, pulmonary exam, and imaging findings), and ACS (less likely given history, reassuring EKG, and pulmonary exam/imaging).  * Acute respiratory failure with hypoxia (HCC) Stable on 4L [place], not on O2 at home. Has history of multiple ICU admissions for respiratory failure. Has been taking her medications at home. Suspect at this time asthma exacerbation as cause, likely 2/2 community acquired pneumonia. Question viral etiology with GI symptoms as discussed below. Given her history, will admit to FMTS attending Dr. Owens Shark. -S/p 1x rocephin, will continue for 4 more days and transition to PO as conditions improve -S/p 1 dose azithromycin, will continue PO for 2 more days -S/p methylprednisolone in ED, will continue PO prednisone 40 mg daily for 4 more days -Scheduled duonebs -Albuterol nebs prn -Continue home trelegy, flonase, and singulair (or formulary alternatives) -O2 supplementation, wean as able;  monitor with continuous pulse ox -Cardiac monitoring given high risk presentation -Vitals per unit routine -Will need PFTs outpatient  Nausea vomiting and diarrhea Stable, improved from before admission. Suspect viral process at cause given other signs of infection. No longer BRBPR as described from a couple days before admission. CTAP with diverticulosis, consider this as etiology. Hgb stable at 13.6. Appears dehydrated on exam with dry MM. Hepatic function also mildly elevated with unconjugated hyperbilirubinemia; consider hemolytic process for hyperbilirubinemia. -Compazine 5 mg Q6H prn -LDH, retic count, haptoglobin -mIVF with LR -Regular diet as tolerated -Tylenol 650 mg Q6H prn -AM CBC, BMP, hepatic fx panel  Hypokalemia K 3.3 on admission. -Added 20 mEq K in LR -20 mEq K tablet -BMP AM  Chronic and stable: Depression: continued sertraline 100 mg daily  FEN/GI: Regular diet, mIVF LR for 1 day VTE Prophylaxis: Lovenox  Disposition: Med-tele  History of Present Illness:  Lydia Griffin is a 61 y.o. female presenting with SOB and abdominal pain.  On Tuesday started having cough and congestion and has persisted until Thursday morning also started having stomach indigestion. Couldn't eat anything soft due to nausea that day. Vomiting all day on Thursday. Has been vomiting every day since then. Hasn't eaten anythign since Thursday. Has been sipping on water. 103.1 Tmax today at 0200 this morning. Headache also-pressure and throbbing on right side. Started Thursday. Never had headaches before. Green thick sputum from cough. Albuterol has been doing it 4 times a day and waking up at night to take albuterol.   Stomach pain in right lower abdomen. Has been ongoing since Thursday. Has been seeing blood (when wiping and in water) in diarrhea 2 times (once on Thursday last week and once on Saturday). Never had a colonoscopy before. NBNB emesis.  Took dramamine, tylenol. Alka seltzer cold  plus, Hemp vana cream over the past few days.  Some chest spasms, none now. Feeling short of breath espescially with exertion. Denies any dysuria or hematuria but does have less urine. Denies anyone around her who has been sick.   In the ED, she was found to be afebrile, not tachycardic or tachypneic, and satting on 90% RA. She was placed on 4L O2 with improvement. EKG unremarkable. CXT with possible bibasilar pneumonia. CTAP with diverticulosis, pulm infection in LLL and pulm edema in RLL. Hepatic function tests also elevated. She was given IV rocephin and azithro. She had 1L fluid bolus.  Review Of Systems: Per HPI.  Pertinent Past Medical History: Ashtma-Trelegy, albuterol, singulair Depression Remainder reviewed in history tab.   Pertinent Past Surgical History: Hand surgery Tubal ligation  Breast biopsy-normal Remainder reviewed in history tab.   Pertinent Social History: Tobacco use: Never (daughter smokes around her) Alcohol use: No Other Substance use: marijuana smokes-2 times a week on the weekends > last time 2 weeks ago; no other drugs Lives with mother (64 years old)  Pertinent Family History: Asthma in all of the girls of the family No MI/strokes in family Aunt-lung cancer in 70s-smoker No known A1AT or liver/abdominal issues Remainder reviewed in history tab.   Important Outpatient Medications: Trelegy Albuterol Singulair Zoloft started 1 month ago MV one a day Keratin Biotin Bloom  Remainder reviewed in medication history.   Objective: BP (!) 146/78   Pulse 98   Temp 98.8 F (37.1 C) (Oral)   Resp 20   Ht $R'5\' 6"'Tr$  (1.676 m)   Wt 70.7 kg Comment: 07/21/22  SpO2 96%   BMI 25.15 kg/m  Exam: General: Alert and oriented, in NAD Skin: Warm, clammy HEENT: NCAT, EOM grossly normal, midline nasal septum, dry mucous membranes Cardiac: RRR, no m/r/g appreciated, no LE edema Respiratory: Inspiratory wheezes with overlying rhonchi throughout, breathing and  speaking comfortably on [place] Abdominal: Soft, minimally TTP throughout, nondistended, normoactive bowel sounds Extremities: Moves all extremities grossly equally in bed Neurological: No gross focal deficit Psychiatric: Emotionally labile throughout encounter  Labs:  CBC BMET  Recent Labs  Lab 08/14/22 0829  WBC 12.7*  HGB 13.6  HCT 38.9  PLT 238   Recent Labs  Lab 08/14/22 0829  NA 135  K 3.3*  CL 99  CO2 20*  BUN 8  CREATININE 0.84  GLUCOSE 97  CALCIUM 9.0        Latest Ref Rng & Units 08/14/2022    8:29 AM 01/24/2021    4:22 AM 01/23/2021    4:24 AM  Hepatic Function  Total Protein 6.5 - 8.1 g/dL 7.3  6.1  6.6   Albumin 3.5 - 5.0 g/dL 3.5  3.2  3.5   AST 15 - 41 U/L 46  17  27   ALT 0 - 44 U/L 46  26  28   Alk Phosphatase 38 - 126 U/L 131  83  98   Total Bilirubin 0.3 - 1.2 mg/dL 1.5  0.6  0.3   Bilirubin, Direct 0.0 - 0.2 mg/dL 0.4      EKG: NSR with Qtc 446, no STEMI/BBB noted  Imaging Studies Performed:  CXR IMPRESSION: Chronic bronchial thickening and pulmonary scarring. Question mild patchy density at both lung bases that could go along with mild basilar pneumonia. Consider two-view chest radiography.  CTAP IMPRESSION: 1. Normal appendix and gallbladder. 2. Diverticulosis of the RIGHT colon without evidence of acute diverticulitis.  3. Pulmonary infection pattern in the LEFT lower lobe and pulmonary edema pattern in the RIGHT lower lobe.  Ethelene Hal, MD 08/14/2022, 2:28 PM PGY-1, [place] Intern pager: [phone], text pages welcome Secure chat group [place]

## 2022-08-14 NOTE — Assessment & Plan Note (Addendum)
Stable on RA. Exam still not improved. Given her history and current exam, will continue IV abx and management for now. -S/p 2x rocephin, will continue for 3 more days and transition to PO as conditions improve -S/p 2 dose azithromycin, will continue PO for 1 more day -Continue PO prednisone 40 mg daily for 3 more days -Amb pulse ox -Scheduled duonebs, albuterol nebs prn -Continue home trelegy, flonase, and singulair (or formulary alternatives) -O2 supplementation, wean as able; monitor with continuous pulse ox -Vitals per unit routine; d/c cardiac monitoring given stability -Will need PFTs outpatient

## 2022-08-14 NOTE — Progress Notes (Signed)
FMTS Brief Progress Note  S:Patient reports no new complaints since her admission. She was just finishing up a breathing treatment upon evaluation and said that she was feeling a bit better because of it. RT at bedside said that patient was significantly wheezing prior to her breathing treatment.   O: BP (!) 126/96   Pulse 90   Temp 98.2 F (36.8 C) (Oral)   Resp (!) 25   Ht 5\' 6"  (1.676 m)   Wt 70.7 kg Comment: 07/21/22  SpO2 94%   BMI 25.15 kg/m   General: NAD, appears stated age CV: RRR, no murmur appreciated but difficult to auscultate over respiratory sounds Respiratory: breathing comfortably on 3L via [place], diffuse wheezing and rhonchi   A/P: Acute hypoxic respiratory failure Patient being treated for asthma exacerbation with concurrent CAP. Currently stable on 3L [place]. - Prednisone daily - Continue azithromycin and CTX - Closely monitor respiratory status - DuoNebs q4h  - Breo and Incruse Ellipta - Albuteorl neb q2h PRN  Sleeping difficulties - Melatonin per patient request  Rise Patience, DO 08/14/2022, 8:37 PM PGY-3,  Family Medicine Night Resident  Please page [phone] with questions.

## 2022-08-14 NOTE — ED Provider Notes (Signed)
[facility]GENCY DEPARTMENT Provider Note   CSN: [id number] Arrival date & time: 08/14/22  0818     History  Chief Complaint  Patient presents with   Nausea    Lydia Griffin is a 61 y.o. female.  HPI   61 year old female with medical history significant for asthma/COPD who presents to the emergency department with multiple complaints.  The patient states that since this past Thursday she has had nausea, vomiting, diarrhea and generalized abdominal discomfort, and some focus to the right lower quadrant.  She additionally states that she has had a cough, mildly productive of sputum, low-grade fevers and nasal congestion at home.  She is tolerating oral intake.  She denies any dysuria or increased urinary frequency.  She endorses a headache located across her forehead.  She endorses generalized fatigue and body aches.  She is not normally on oxygen.  Home Medications Prior to Admission medications   Medication Sig Start Date End Date Taking? Authorizing Provider  albuterol (PROVENTIL) (2.5 MG/3ML) 0.083% nebulizer solution Take 3 mLs (2.5 mg total) by nebulization every 4 (four) hours as needed for wheezing or shortness of breath. 01/24/21   Pokhrel, Corrie Mckusick, MD  albuterol (VENTOLIN HFA) 108 (90 Base) MCG/ACT inhaler INHALE 2 PUFFS INTO THE LUNGS EVERY 6 HOURS AS NEEDED FOR WHEEZING OR SHORTNESS OF BREATH 04/24/22   Shary Key, DO  Biotin 1 MG CAPS Take 1 mg by mouth daily.    [provider]  Carboxymethylcellulose Sodium (ARTIFICIAL TEARS OP) Place 1 drop into both eyes daily as needed (dry eyes).    [provider]  Cyanocobalamin (VITAMIN B 12 PO) Take 1,000 mcg by mouth daily.    [provider]  fluticasone (FLONASE) 50 MCG/ACT nasal spray Place 2 sprays into both nostrils daily. 01/24/21   Pokhrel, Corrie Mckusick, MD  Fluticasone-Umeclidin-Vilant (TRELEGY ELLIPTA) 200-62.5-25 MCG/ACT AEPB Inhale 1 puff into the lungs daily. 03/31/22   [name], MD  Fluticasone-Umeclidin-Vilant (TRELEGY ELLIPTA) 200-62.5-25 MCG/INH AEPB inhale 1 puff into the lungs daily 01/24/21   Pokhrel, Corrie Mckusick, MD  ipratropium-albuterol (DUONEB) 0.5-2.5 (3) MG/3ML SOLN USE 1 VIAL VIA NEBULIZER EVERY 4 HOURS AS NEEDED 02/06/21   Arby Barrette, [place] J, DO  montelukast (SINGULAIR) 10 MG tablet Take 1 tablet (10 mg total) by mouth at bedtime. 01/24/21   Pokhrel, Corrie Mckusick, MD  Multiple Vitamins-Minerals (MULTIVITAMIN WITH MINERALS) tablet Take 1 tablet by mouth daily.    [provider]  sertraline (ZOLOFT) 100 MG tablet Take 1 tablet (100 mg total) by mouth daily. 07/11/22   Holley Bouche, MD  loratadine (CLARITIN) 10 MG tablet Take 1 tablet (10 mg total) by mouth daily. 07/14/19 04/26/20  Shirley, [place], DO      Allergies    Patient has no known allergies.    Review of Systems   Review of Systems  All other systems reviewed and are negative.   Physical Exam Updated Vital Signs BP 134/76   Pulse 78   Temp 98.9 F (37.2 C) (Oral)   Resp 20   SpO2 99%  Physical Exam Vitals and nursing note reviewed.  Constitutional:      General: She is not in acute distress.    Appearance: She is well-developed.  HENT:     Head: Normocephalic and atraumatic.  Eyes:     Conjunctiva/sclera: Conjunctivae normal.  Cardiovascular:     Rate and Rhythm: Normal rate and regular rhythm.  Pulmonary:     Effort: Pulmonary effort is normal. No  respiratory distress.     Breath sounds: Wheezing present. No rhonchi or rales.  Abdominal:     Palpations: Abdomen is soft.     Tenderness: There is abdominal tenderness in the right lower quadrant. There is no guarding or rebound. Positive signs include McBurney's sign. Negative signs include Murphy's sign.  Musculoskeletal:        General: No swelling.     Cervical back: Neck supple.  Skin:    General: Skin is warm and dry.     Capillary Refill: Capillary refill takes less than 2 seconds.  Neurological:     Mental Status:  She is alert.  Psychiatric:        Mood and Affect: Mood normal.     ED Results / Procedures / Treatments   Labs (all labs ordered are listed, but only abnormal results are displayed) Labs Reviewed  CBC WITH DIFFERENTIAL/PLATELET - Abnormal; Notable for the following components:      Result Value   WBC 12.7 (*)    Neutro Abs 10.2 (*)    Monocytes Absolute 1.1 (*)    All other components within normal limits  BASIC METABOLIC PANEL - Abnormal; Notable for the following components:   Potassium 3.3 (*)    CO2 20 (*)    Anion gap 16 (*)    All other components within normal limits  HEPATIC FUNCTION PANEL - Abnormal; Notable for the following components:   AST 46 (*)    ALT 46 (*)    Alkaline Phosphatase 131 (*)    Total Bilirubin 1.5 (*)    Bilirubin, Direct 0.4 (*)    Indirect Bilirubin 1.1 (*)    All other components within normal limits  RESP PANEL BY RT-PCR (FLU A&B, COVID) ARPGX2  LIPASE, BLOOD  MAGNESIUM    EKG EKG Interpretation  Date/Time:  Monday August 14 2022 09:19:02 EDT Ventricular Rate:  79 PR Interval:  129 QRS Duration: 78 QT Interval:  389 QTC Calculation: 446 R Axis:   67 Text Interpretation: Sinus rhythm LAE, consider biatrial enlargement Probable left ventricular hypertrophy Confirmed by Ernie Avena ([id number]) on 08/14/2022 10:02:03 AM  Radiology CT ABDOMEN PELVIS W CONTRAST  Result Date: 08/14/2022 CLINICAL DATA:  RIGHT lower quadrant abdominal pain EXAM: CT ABDOMEN AND PELVIS WITH CONTRAST TECHNIQUE: Multidetector CT imaging of the abdomen and pelvis was performed using the standard protocol following bolus administration of intravenous contrast. RADIATION DOSE REDUCTION: This exam was performed according to the departmental dose-optimization program which includes automated exposure control, adjustment of the mA and/or kV according to patient size and/or use of iterative reconstruction technique. CONTRAST:  50mL OMNIPAQUE IOHEXOL 350 MG/ML SOLN  COMPARISON:  None Available. FINDINGS: Lower chest: Patches of ground-glass density in the RIGHT lower lobe. Segmental branching nodularity in the LEFT lower lobe (image 12 through 20 of series 5). Hepatobiliary: No focal hepatic lesion. No biliary duct dilatation. Common bile duct is normal. Pancreas: Pancreas is normal. No ductal dilatation. No pancreatic inflammation. Spleen: Normal spleen Adrenals/urinary tract: Adrenal glands and kidneys are normal. The ureters and bladder normal. Stomach/Bowel: Stomach, small bowel, appendix, and cecum are normal. Multiple diverticula the ascending colon. Minimal diverticular disease in the remaining colon. Rectosigmoid colon normal. Vascular/Lymphatic: Abdominal aorta is normal caliber. No periportal or retroperitoneal adenopathy. No pelvic adenopathy. Reproductive: Uterus and adnexa unremarkable. Other: No free fluid. Musculoskeletal: No aggressive osseous lesion. IMPRESSION: 1. Normal appendix and gallbladder. 2. Diverticulosis of the RIGHT colon without evidence of acute diverticulitis. 3. Pulmonary infection pattern  in the LEFT lower lobe and pulmonary edema pattern in the RIGHT lower lobe. Electronically Signed   By: Genevive Bi M.D.   On: 08/14/2022 10:23   DG Chest Portable 1 View  Result Date: 08/14/2022 CLINICAL DATA:  Cough and shortness of breath EXAM: PORTABLE CHEST 1 VIEW COMPARISON:  01/11/2022 FINDINGS: Artifact overlies the chest. Heart size is normal. Mediastinal shadows are normal. Chronic bronchial thickening and pulmonary scarring. Question mild increased patchy density at both lung bases that could go along with mild basilar pneumonia. Consider two-view chest radiography. No pleural effusion. Mild scoliosis of the spine. IMPRESSION: Chronic bronchial thickening and pulmonary scarring. Question mild patchy density at both lung bases that could go along with mild basilar pneumonia. Consider two-view chest radiography. Electronically Signed   By:  Paulina Fusi M.D.   On: 08/14/2022 08:57    Procedures Procedures    Medications Ordered in ED Medications  ipratropium-albuterol (DUONEB) 0.5-2.5 (3) MG/3ML nebulizer solution 3 mL (3 mLs Nebulization Given 08/14/22 1135)  ipratropium-albuterol (DUONEB) 0.5-2.5 (3) MG/3ML nebulizer solution 3 mL (3 mLs Nebulization Given 08/14/22 1217)  azithromycin (ZITHROMAX) 500 mg in sodium chloride 0.9 % 250 mL IVPB (has no administration in time range)  lactated ringers bolus 1,000 mL (1,000 mLs Intravenous New Bag/Given 08/14/22 1033)  ondansetron (ZOFRAN) injection 4 mg (4 mg Intravenous Given 08/14/22 1025)  methylPREDNISolone sodium succinate (SOLU-MEDROL) 125 mg/2 mL injection 125 mg (125 mg Intravenous Given 08/14/22 1022)  acetaminophen (TYLENOL) tablet 1,000 mg (1,000 mg Oral Given 08/14/22 1041)  potassium chloride SA (KLOR-CON M) CR tablet 20 mEq (20 mEq Oral Given 08/14/22 1041)  iohexol (OMNIPAQUE) 350 MG/ML injection 100 mL (75 mLs Intravenous Contrast Given 08/14/22 1013)  cefTRIAXone (ROCEPHIN) 1 g in sodium chloride 0.9 % 100 mL IVPB (1 g Intravenous New Bag/Given 08/14/22 1152)    ED Course/ Medical Decision Making/ A&P Clinical Course as of 08/14/22 1223  Mon Aug 14, 2022  1006 WBC(!): 12.7 [JL]  1006 Potassium(!): 3.3 [JL]    Clinical Course User Index [JL] Ernie Avena, MD                           Medical Decision Making Amount and/or Complexity of Data Reviewed Labs: ordered. Decision-making details documented in ED Course. Radiology: ordered.  Risk OTC drugs. Prescription drug management. Decision regarding hospitalization.   61 year old female with medical history significant for asthma/COPD who presents to the emergency department with multiple complaints.  The patient states that since this past Thursday she has had nausea, vomiting, diarrhea and generalized abdominal discomfort, and some focus to the right lower quadrant.  She additionally states that she  has had a cough, mildly productive of sputum, low-grade fevers and nasal congestion at home.  She is tolerating oral intake.  She denies any dysuria or increased urinary frequency.  She endorses a headache located across her forehead.  She endorses generalized fatigue and body aches.  She is not normally on oxygen.  On arrival, the patient was afebrile, not tachycardic or tachypneic, BP 142/80, saturating 90% on room air.  The patient was subsequently placed on 4 L O2 with improvement in her symptoms.  Differential diagnosis includes viral URI, COVID-19, pneumonia, asthma exacerbation, differential involving the patient's abdominal discomfort includes influenza/viral infection, appendicitis, less likely small bowel obstruction, diverticulitis, nephrolithiasis, pyelonephritis.    EKG was performed revealed sinus rhythm, ventricular rate 79, no ST segment changes to indicate coronary ischemia.  A  chest x-ray revealed possible basilar pneumonia.  Laboratory evaluation significant for COVID-19 influenza PCR testing negative, BMP with hypokalemia to 3.3, mild anion gap acidosis with a bicarbonate of 20, anion gap of 16, hepatic function panel with elevated LFTs with an AST and ALT of 46, T. bili elevated 1.5, direct bili elevated to 0.4, CBC with leukocytosis 12.7, no anemia, hemoglobin 13.6, lipase normal.  A CT abdomen pelvis was performed and results are as follows: IMPRESSION:  1. Normal appendix and gallbladder.  2. Diverticulosis of the RIGHT colon without evidence of acute  diverticulitis.  3. Pulmonary infection pattern in the LEFT lower lobe and pulmonary  edema pattern in the RIGHT lower lobe.   The patient was covered with broad-spectrum antibiotics to include Rocephin and azithromycin.  She was administered 1 L IV fluid bolus.  Given the concern for community-acquired pneumonia in the left lower lobe and hypoxic respiratory failure requiring O2, concern for COPD exacerbation, family medicine  was consulted for admission and accepted the patient in admission.   Final Clinical Impression(s) / ED Diagnoses Final diagnoses:  Community acquired pneumonia of left lower lobe of lung  Acute respiratory failure with hypoxia (HCC)  Nausea vomiting and diarrhea  Chronic obstructive pulmonary disease, unspecified COPD type [facility])    Rx / DC Orders ED Discharge Orders     None         Ernie Avena, MD 08/14/22 1223

## 2022-08-15 ENCOUNTER — Encounter (HOSPITAL_COMMUNITY): Payer: Self-pay | Admitting: Family Medicine

## 2022-08-15 LAB — BASIC METABOLIC PANEL
Anion gap: 12 (ref 5–15)
BUN: 8 mg/dL (ref 8–23)
CO2: 22 mmol/L (ref 22–32)
Calcium: 8.9 mg/dL (ref 8.9–10.3)
Chloride: 101 mmol/L (ref 98–111)
Creatinine, Ser: 0.68 mg/dL (ref 0.44–1.00)
GFR, Estimated: >60 mL/min (ref >60)
Glucose, Bld: 145 mg/dL — ABNORMAL HIGH (ref 70–99)
Potassium: 3.5 mmol/L (ref 3.5–5.1)
Sodium: 135 mmol/L (ref 135–145)

## 2022-08-15 LAB — CBC
HCT: 33.8 % — ABNORMAL LOW (ref 36.0–46.0)
Hemoglobin: 11.7 g/dL — ABNORMAL LOW (ref 12.0–15.0)
MCH: 28.6 pg (ref 26.0–34.0)
MCHC: 34.6 g/dL (ref 30.0–36.0)
MCV: 82.6 fL (ref 80.0–100.0)
Platelets: 264 10*3/uL (ref 150–400)
RBC: 4.09 MIL/uL (ref 3.87–5.11)
RDW: 14.1 % (ref 11.5–15.5)
WBC: 9.6 10*3/uL (ref 4.0–10.5)
nRBC: 0 % (ref 0.0–0.2)

## 2022-08-15 LAB — HEPATIC FUNCTION PANEL
ALT: 43 U/L (ref 0–44)
AST: 42 U/L — ABNORMAL HIGH (ref 15–41)
Albumin: 3 g/dL — ABNORMAL LOW (ref 3.5–5.0)
Alkaline Phosphatase: 117 U/L (ref 38–126)
Bilirubin, Direct: 0.1 mg/dL (ref 0.0–0.2)
Indirect Bilirubin: 0.4 mg/dL (ref 0.3–0.9)
Total Bilirubin: 0.5 mg/dL (ref 0.3–1.2)
Total Protein: 6.5 g/dL (ref 6.5–8.1)

## 2022-08-15 LAB — HAPTOGLOBIN: Haptoglobin: 276 mg/dL (ref 37–355)

## 2022-08-15 LAB — HIV ANTIBODY (ROUTINE TESTING W REFLEX): HIV Screen 4th Generation wRfx: NONREACTIVE

## 2022-08-15 MED ORDER — LIP MEDEX EX OINT
TOPICAL_OINTMENT | CUTANEOUS | Status: DC | PRN
  Administered 2022-08-15: 75 via TOPICAL
  Filled 2022-08-15: qty 7

## 2022-08-15 NOTE — Progress Notes (Signed)
NEW ADMISSION NOTE New Admission Note:   Arrival Method: stretcher  Mental Orientation: A&OX4 Telemetry: 5M09 Assessment: Completed Skin:intact IV:RAC Pain:5/10 Tubes:NONE Safety Measures: Safety Fall Prevention Plan has been given, discussed and implemented. Admission: Completed [facility] Orientation: Patient has been orientated to the room, unit and staff.  Family:NONE  Orders have been reviewed and implemented. Will continue to monitor the patient. Call light has been placed within reach and bed alarm has been activated.   Virgina Jock, RN

## 2022-08-15 NOTE — ED Notes (Signed)
Walked patient with pulse oxy patient did ok stayed at 90 room air then went down to 89 room air and went right back up to 90 room air patient is now back in bed on the monitor with call bell; in reach

## 2022-08-15 NOTE — ED Notes (Signed)
Pt in room A&O x4. Denies any pain at this time. No SOB. Placed pt on RA. Pt consumed 100% of breakfast. Lungs wheezing throughout. Pt given Toothbrush and toothpaste.

## 2022-08-15 NOTE — Progress Notes (Signed)
Patient oxygen saturation 93% resting on RA. Ambulated in halls on RA oxygen saturation 92%.

## 2022-08-15 NOTE — Hospital Course (Signed)
Lydia Griffin is a 61 y.o. female who presented with asthma/COPD exacerbation with PNA. PMH includes asthma and depression. A brief hospital course is below.   Acute respiratory failure 2/2 asthma/COPD and CAP Presented with persistent cough and congestion and GI symptoms (as below). She had a fever at home to 103.1. On exam, she was afebrile, borderline tachycardic, hypertensive, and requiring 4L [place] with inspiratory wheezing and diffuse rhonchi. CXR with PNA. She received rocephin, azithromycin, and methylprednisolone in ED. She was continued on abx, prednisone, duonebs, and home asthma/COPD medications with improvement. By discharge, she was satting well on RA and transitioned to remaining course of cefdinir and prednisone.  Nausea vomiting and diarrhea likely 2/2 viral process Presented with history of multiple episodes of n/v/d. Admission exam as above and additionally she appeared dehydrated. Also endorsed BRBPR, though this had resolved by admission. Hgb stable at 13.6, LFTs mildly elevated, and both total and indirect bilirubin elevated. LDH also elevated. CTAP with diverticulosis that could have been blood etiology. Repeat hepatic function panel with completely normalized LFTs and bilirubin; suspect acute viral illness as cause. IVF initiated until PO resumed. By discharge, her GI symptoms had resolved.   Hypokalemia Experienced two episodes of hypokalemia. K was repleted as needed.   Chronic and stable: Depression: continued sertraline 100 mg daily.  Issues for follow up: Assess respiratory status, GI symptoms Monitor ability to afford trelegy - pharmacy team to help with patient assistance programs Recommend obtaining pulmonary function testing to confirm COPD Repeat chest x-ray in 4 to 6 weeks

## 2022-08-15 NOTE — Progress Notes (Addendum)
Daily Progress Note Intern Pager: [phone]  Patient name: Lydia Griffin Medical record number: [id number] Date of birth: 1961-06-16 Age: 61 y.o. Gender: female  Primary Care Provider: Shary Key, DO Consultants: None Code Status: Full  Pt Overview and Major Events to Date:  10/23 - admitted  Assessment and Plan:  Lydia Griffin is a 61 y.o. female who presented with SOB and abdominal pain and found to have acute respiratory failure likely 2/2 CAP in setting of asthma. Pertinent PMH/PSH includes asthma, depression.   * Acute respiratory failure with hypoxia (HCC) Stable on RA. Exam still not improved. Given her history and current exam, will continue IV abx and management for now. -S/p 2x rocephin, will continue for 3 more days and transition to PO as conditions improve -S/p 2 dose azithromycin, will continue PO for 1 more day -Continue PO prednisone 40 mg daily for 3 more days -Amb pulse ox -Scheduled duonebs, albuterol nebs prn -Continue home trelegy, flonase, and singulair (or formulary alternatives) -O2 supplementation, wean as able; monitor with continuous pulse ox -Vitals per unit routine; d/c cardiac monitoring given stability -Will need PFTs outpatient  Nausea vomiting and diarrhea Stable, no episodes since admission. LDH elevated, consider infectious etiology vs hemolysis given normal haptoglobin. Hepatic function otherwise drastically improved, consider lab error vs dehydration as cause. -Continue regular diet as tolerated -Compazine 5 mg Q6H prn -AM CBC, CMP  Hypokalemia K 3.5 -BMP AM   FEN/GI: Regular diet, d/c fluids with increased oral intake PPx: Lovenox Dispo:Pending PT recommendations , likely home.  Subjective:  Feeling much better this morning. She is coughing still and bringing up lots of phlegm.  Objective: Temp:  [97.9 F (36.6 C)-98.3 F (36.8 C)] 98 F (36.7 C) (10/24 1300) Pulse Rate:  [75-99] 84 (10/24 1300) Resp:  [18-26] 22  (10/24 1300) BP: (112-162)/(54-99) 125/60 (10/24 1300) SpO2:  [92 %-100 %] 95 % (10/24 1300) Physical Exam: General: Alert and oriented, in NAD Skin: Warm, dry HEENT: NCAT, EOM grossly normal, midline nasal septum Cardiac: RRR, no m/r/g appreciated Respiratory: Inspiratory wheezing with rhonchorous sounds diffusely, breathing and speaking comfortably on RA Extremities: Moves all extremities grossly equally in bed Neurological: No gross focal deficit Psychiatric: Appropriate mood and affect   Laboratory: Most recent CBC Lab Results  Component Value Date   WBC 9.6 08/15/2022   HGB 11.7 (L) 08/15/2022   HCT 33.8 (L) 08/15/2022   MCV 82.6 08/15/2022   PLT 264 08/15/2022   Most recent BMP    Latest Ref Rng & Units 08/15/2022    3:00 AM  BMP  Glucose 70 - 99 mg/dL 145   BUN 8 - 23 mg/dL 8   Creatinine 0.44 - 1.00 mg/dL 0.68   Sodium 135 - 145 mmol/L 135   Potassium 3.5 - 5.1 mmol/L 3.5   Chloride 98 - 111 mmol/L 101   CO2 22 - 32 mmol/L 22   Calcium 8.9 - 10.3 mg/dL 8.9       Latest Ref Rng & Units 08/15/2022    3:00 AM 08/14/2022    8:29 AM 01/24/2021    4:22 AM  Hepatic Function  Total Protein 6.5 - 8.1 g/dL 6.5  7.3  6.1   Albumin 3.5 - 5.0 g/dL 3.0  3.5  3.2   AST 15 - 41 U/L 42  46  17   ALT 0 - 44 U/L 43  46  26   Alk Phosphatase 38 - 126 U/L 117  131  83   Total Bilirubin 0.3 - 1.2 mg/dL 0.5  1.5  0.6   Bilirubin, Direct 0.0 - 0.2 mg/dL 0.1  0.4     Ethelene Hal, MD 08/15/2022, 1:59 PM PGY-1, [place] Intern pager: [phone], text pages welcome Secure chat group [place]

## 2022-08-16 ENCOUNTER — Other Ambulatory Visit (HOSPITAL_COMMUNITY): Payer: Self-pay

## 2022-08-16 LAB — COMPREHENSIVE METABOLIC PANEL
ALT: 41 U/L (ref 0–44)
AST: 33 U/L (ref 15–41)
Albumin: 2.9 g/dL — ABNORMAL LOW (ref 3.5–5.0)
Alkaline Phosphatase: 107 U/L (ref 38–126)
Anion gap: 8 (ref 5–15)
BUN: 8 mg/dL (ref 8–23)
CO2: 25 mmol/L (ref 22–32)
Calcium: 8.7 mg/dL — ABNORMAL LOW (ref 8.9–10.3)
Chloride: 105 mmol/L (ref 98–111)
Creatinine, Ser: 0.76 mg/dL (ref 0.44–1.00)
GFR, Estimated: >60 mL/min (ref >60)
Glucose, Bld: 108 mg/dL — ABNORMAL HIGH (ref 70–99)
Potassium: 3.2 mmol/L — ABNORMAL LOW (ref 3.5–5.1)
Sodium: 138 mmol/L (ref 135–145)
Total Bilirubin: 0.5 mg/dL (ref 0.3–1.2)
Total Protein: 6.3 g/dL — ABNORMAL LOW (ref 6.5–8.1)

## 2022-08-16 LAB — CBC
HCT: 34 % — ABNORMAL LOW (ref 36.0–46.0)
Hemoglobin: 11.8 g/dL — ABNORMAL LOW (ref 12.0–15.0)
MCH: 28.7 pg (ref 26.0–34.0)
MCHC: 34.7 g/dL (ref 30.0–36.0)
MCV: 82.7 fL (ref 80.0–100.0)
Platelets: 328 10*3/uL (ref 150–400)
RBC: 4.11 MIL/uL (ref 3.87–5.11)
RDW: 14.3 % (ref 11.5–15.5)
WBC: 12.1 10*3/uL — ABNORMAL HIGH (ref 4.0–10.5)
nRBC: 0 % (ref 0.0–0.2)

## 2022-08-16 MED ORDER — PREDNISONE 20 MG PO TABS
40.0000 mg | ORAL_TABLET | Freq: Every day | ORAL | 0 refills | Status: AC
  Filled 2022-08-16: qty 4, 2d supply, fill #0

## 2022-08-16 MED ORDER — ACETAMINOPHEN 325 MG PO TABS: 650.0000 mg | ORAL_TABLET | Freq: Four times a day (QID) | ORAL | 0 refills | Status: DC | PRN

## 2022-08-16 MED ORDER — POTASSIUM CHLORIDE CRYS ER 20 MEQ PO TBCR
40.0000 meq | EXTENDED_RELEASE_TABLET | Freq: Once | ORAL | Status: AC
  Administered 2022-08-16: 40 meq via ORAL
  Filled 2022-08-16: qty 2

## 2022-08-16 MED ORDER — ALBUTEROL SULFATE HFA 108 (90 BASE) MCG/ACT IN AERS
2.0000 | INHALATION_SPRAY | Freq: Four times a day (QID) | RESPIRATORY_TRACT | 2 refills | Status: DC | PRN
  Filled 2022-08-16: qty 6.7, 25d supply, fill #0

## 2022-08-16 MED ORDER — CEFDINIR 300 MG PO CAPS
300.0000 mg | ORAL_CAPSULE | Freq: Two times a day (BID) | ORAL | 0 refills | Status: AC
  Filled 2022-08-16: qty 6, 3d supply, fill #0

## 2022-08-16 MED ORDER — PROCHLORPERAZINE MALEATE 5 MG PO TABS
5.0000 mg | ORAL_TABLET | Freq: Four times a day (QID) | ORAL | 0 refills | Status: DC | PRN
  Filled 2022-08-16: qty 30, 8d supply, fill #0

## 2022-08-16 NOTE — Progress Notes (Signed)
     Daily Progress Note Intern Pager: [phone]  Patient name: Lydia Griffin Medical record number: [id number] Date of birth: 1961-09-08 Age: 61 y.o. Gender: female  Primary Care Provider: Shary Key, DO Consultants: None Code Status: Full   Pt Overview and Major Events to Date:  10/23 - admitted   Assessment and Plan:   Lydia Griffin is a 61 y.o. female who presented with SOB and abdominal pain and found to have acute respiratory failure likely 2/2 CAP in setting of asthma. Pertinent PMH/PSH includes asthma, depression.   * Acute respiratory failure with hypoxia (HCC) Stable on RA. Ambulated well without significant desats. Exam slightly improved. -S/p 3x rocephin, will transition to PO cefdinir for 2 more days -s/p azithro course -Continue PO prednisone 40 mg daily for 2 more days -Scheduled duonebs, albuterol nebs prn -Continue home trelegy (will obtain sample from [facility] before dc), flonase, and singulair -O2 supplementation, wean as able; monitor with continuous pulse ox -Vitals per unit routine; d/c cardiac monitoring given stability -Will need PFTs outpatient  Nausea vomiting and diarrhea Stable, no episodes since admission. LDH elevated, consider infectious etiology vs hemolysis given normal haptoglobin. Hepatic function otherwise drastically improved, consider lab error vs dehydration as cause. -Continue regular diet as tolerated -Compazine 5 mg Q6H prn -AM CBC, CMP  Hypokalemia K stable. -BMP AM   FEN/GI: Regular diet, d/c fluids with increased oral intake PPx: Lovenox Dispo:Home likely today  Subjective:  Feeling much better this morning. She is concerned about going home and having to work a lot with her grandkids after being in the hospital.  Objective: Temp:  [97.8 F (36.6 C)-98.3 F (36.8 C)] 97.8 F (36.6 C) (10/25 0909) Pulse Rate:  [73-87] 87 (10/25 0909) Resp:  [18-22] 19 (10/24 2102) BP: (125-150)/(60-77) 145/77 (10/25 0909) SpO2:  [91  %-98 %] 98 % (10/25 1100) Physical Exam: General: Alert and oriented, in NAD Skin: Warm, dry, and intact HEENT: NCAT, EOM grossly normal, midline nasal septum Cardiac: RRR, no m/r/g appreciated Respiratory: CTAB, breathing and speaking comfortably on RA Abdominal: Nondistended Extremities: Moves all extremities grossly equally in bed Neurological: No gross focal deficit Psychiatric: Appropriate mood and affect   Laboratory: Most recent CBC Lab Results  Component Value Date   WBC 12.1 (H) 08/16/2022   HGB 11.8 (L) 08/16/2022   HCT 34.0 (L) 08/16/2022   MCV 82.7 08/16/2022   PLT 328 08/16/2022   Most recent BMP    Latest Ref Rng & Units 08/16/2022    8:11 AM  BMP  Glucose 70 - 99 mg/dL 108   BUN 8 - 23 mg/dL 8   Creatinine 0.44 - 1.00 mg/dL 0.76   Sodium 135 - 145 mmol/L 138   Potassium 3.5 - 5.1 mmol/L 3.2   Chloride 98 - 111 mmol/L 105   CO2 22 - 32 mmol/L 25   Calcium 8.9 - 10.3 mg/dL 8.7    Lydia Hal, MD 08/16/2022, 11:53 AM PGY-1, [place] Intern pager: [phone], text pages welcome Secure chat group [place]

## 2022-08-16 NOTE — Discharge Summary (Addendum)
Family Medicine Teaching [facility] Discharge Summary  Patient name: Lydia Griffin Medical record number: [id number] Date of birth: 10-Mar-1961 Age: 61 y.o. Gender: female Date of Admission: 08/14/2022  Date of Discharge: 08/16/2022 Admitting Physician: Evette Georges, MD  Primary Care Provider: Cora Collum, DO Consultants: None  Indication for Hospitalization: Acute respiratory failure  Brief Hospital Course:  Lydia Griffin is a 61 y.o. female who presented with asthma/COPD exacerbation with PNA. PMH includes asthma and depression. A brief hospital course is below.   Acute respiratory failure 2/2 asthma/COPD and CAP Presented with persistent cough and congestion and GI symptoms (as below). She had a fever at home to 103.1. On exam, she was afebrile, borderline tachycardic, hypertensive, and requiring 4L [place] with inspiratory wheezing and diffuse rhonchi. CXR with PNA. She received rocephin, azithromycin, and methylprednisolone in ED. She was continued on abx, prednisone, duonebs, and home asthma/COPD medications with improvement. By discharge, she had symptom improvement, normal oxygen saturations on room air and transitioned to remaining course of cefdinir and prednisone.  Nausea vomiting and diarrhea likely 2/2 viral process Presented with history of multiple episodes of n/v/d. Admission exam as above and additionally she appeared dehydrated. Also endorsed BRBPR, though this had resolved by admission. Hgb stable at 13.6, LFTs mildly elevated, and both total and indirect bilirubin elevated. LDH also elevated. CTAP with diverticulosis that could have been blood etiology. Repeat hepatic function panel with completely normalized LFTs and bilirubin; suspect acute viral illness as cause. IVF initiated until PO resumed. By discharge, her GI symptoms had resolved and she was tolerating PO well.   Hypokalemia Experienced two episodes of hypokalemia. K was repleted as needed.   Chronic  and stable: Depression: continued sertraline 100 mg daily.  Issues for follow up: Assess respiratory status, GI symptoms Repeat CBC and CMP at follow up, recommend consideration of colonoscopy for anemia  Monitor ability to afford trelegy - pharmacy team to help with patient assistance programs Recommend obtaining outpatient pulmonary function testing  Repeat chest x-ray in 4 to 6 weeks  Discharge Diagnoses/Problem List:  Principal Problem for Admission: Acute hypoxemic respiratory failure Other Problems addressed during stay:  Present on Admission:  Hypokalemia - Community acquired pneumonia - Asthma   Disposition: Home  Discharge Condition: Stable  Discharge Exam:  Blood pressure (!) 145/77, pulse 87, temperature 97.8 F (36.6 C), temperature source Oral, resp. rate 19, height 5\' 6"  (1.676 m), weight 70.7 kg, SpO2 98 %.  General: Alert and oriented, in NAD Skin: Warm, dry, and intact HEENT: NCAT, EOM grossly normal, midline nasal septum Cardiac: RRR, no m/r/g appreciated Respiratory: Coarse bilateral breath sound with trace wheezing, improved form prior , breathing and speaking comfortably on RA Abdominal: Nondistended Extremities: Moves all extremities grossly equally in bed Neurological: No gross focal deficit Psychiatric: Appropriate mood and affect   Significant Labs and Imaging:  Recent Labs  Lab 08/15/22 0300 08/16/22 0811  WBC 9.6 12.1*  HGB 11.7* 11.8*  HCT 33.8* 34.0*  PLT 264 328   Recent Labs  Lab 08/15/22 0300 08/16/22 0811  NA 135 138  K 3.5 3.2*  CL 101 105  CO2 22 25  GLUCOSE 145* 108*  BUN 8 8  CREATININE 0.68 0.76  CALCIUM 8.9 8.7*  ALKPHOS 117 107  AST 42* 33  ALT 43 41  ALBUMIN 3.0* 2.9*   CXR IMPRESSION: Chronic bronchial thickening and pulmonary scarring. Question mild patchy density at both lung bases that could go along with mild basilar  pneumonia. Consider two-view chest radiography.  CTAP IMPRESSION: 1. Normal appendix  and gallbladder. 2. Diverticulosis of the RIGHT colon without evidence of acute diverticulitis. 3. Pulmonary infection pattern in the LEFT lower lobe and pulmonary edema pattern in the RIGHT lower lobe.  Discharge Medications:  Allergies as of 08/16/2022   No Known Allergies      Medication List     TAKE these medications    acetaminophen 325 MG tablet Commonly known as: TYLENOL Take 2 tablets (650 mg total) by mouth every 6 (six) hours as needed for mild pain or moderate pain. What changed:  medication strength how much to take reasons to take this   albuterol (2.5 MG/3ML) 0.083% nebulizer solution Commonly known as: PROVENTIL Take 3 mLs (2.5 mg total) by nebulization every 4 (four) hours as needed for wheezing or shortness of breath.   albuterol 108 (90 Base) MCG/ACT inhaler Commonly known as: VENTOLIN HFA Inhale 2 puffs into the lungs every 6 (six) hours as needed for wheezing or shortness of breath.   Alka-Seltzer Plus Cold 2-7.8-325 MG Tbef Generic drug: Chlorphen-Phenyleph-ASA Take 2 tablets by mouth every 6 (six) hours as needed (cold symptoms).   ARTIFICIAL TEARS OP Place 1 drop into both eyes daily as needed (dry eyes).   Biotin 1 MG Caps Take 1 mg by mouth daily.   cefdinir 300 MG capsule Commonly known as: OMNICEF Take 1 capsule (300 mg total) by mouth 2 (two) times daily for 3 days.   dimenhyDRINATE 50 MG tablet Commonly known as: DRAMAMINE Take 50 mg by mouth every 8 (eight) hours as needed for nausea.   fluticasone 50 MCG/ACT nasal spray Commonly known as: FLONASE Place 2 sprays into both nostrils daily.   ipratropium-albuterol 0.5-2.5 (3) MG/3ML Soln Commonly known as: DUONEB USE 1 VIAL VIA NEBULIZER EVERY 4 HOURS AS NEEDED What changed: See the new instructions.   montelukast 10 MG tablet Commonly known as: SINGULAIR Take 1 tablet (10 mg total) by mouth at bedtime.   multivitamin with minerals tablet Take 1 tablet by mouth daily.    predniSONE 20 MG tablet Commonly known as: DELTASONE Take 2 tablets (40 mg total) by mouth daily with breakfast for 2 days.   prochlorperazine 5 MG tablet Commonly known as: COMPAZINE Take 1 tablet (5 mg total) by mouth every 6 (six) hours as needed for nausea or vomiting.   sertraline 100 MG tablet Commonly known as: ZOLOFT Take 1 tablet (100 mg total) by mouth daily.   Trelegy Ellipta 200-62.5-25 MCG/ACT Aepb Generic drug: Fluticasone-Umeclidin-Vilant Inhale 1 puff into the lungs daily. What changed: Another medication with the same name was removed. Continue taking this medication, and follow the directions you see here.   VITAMIN B 12 PO Take 1,000 mcg by mouth daily.       Discharge Instructions: Please refer to Patient Instructions section of EMR for full details.  Patient was counseled important signs and symptoms that should prompt return to medical care, changes in medications, dietary instructions, activity restrictions, and follow up appointments.   Follow-Up Appointments:  Follow-up Information     Shary Key, DO. Call.   Specialty: Family Medicine Why: Call to make an appointment for hospital follow up ASAP. Contact information: [address] [place] [address] [phone]                Ethelene Hal, MD 08/16/2022, 12:06 PM PGY-1, [place] [facility]  Upper Level Addendum:  I have seen and evaluated this  patient along with Dr. Phineas Real and reviewed the above note, making necessary revisions as appropriate.  I agree with the medical decision making and physical exam as noted above.  Levin Erp, MD PGY-2 [facility] Family Medicine Residency

## 2022-08-16 NOTE — Discharge Instructions (Addendum)
Dear Jesus Genera,   Thank you so much for allowing [place] to be part of your care!  You were admitted to [facility] for pneumonia.  You were treated with antibiotics as well as steroids for possible COPD and/or asthma exacerbation.   POST-HOSPITAL & CARE INSTRUCTIONS Continue taking antibiotics and steroids as prescribed. Ask your doctor about getting pulmonary function testing done. Follow-up with your primary care provider as scheduled below for hospital follow-up. Please let PCP/Specialists know of any changes that were made.  Please see medications section of this packet for any medication changes.   DOCTOR'S APPOINTMENT & FOLLOW UP CARE INSTRUCTIONS  Future Appointments  Date Time Provider [place]  08/25/2022  8:30 AM Shary Key, DO [facility] [place]    RETURN PRECAUTIONS: Return to the ED or call your doctor if you develop significant shortness of breath.  Take care and be well!  [facility]  [place], [place] [address] [phone]

## 2022-08-16 NOTE — Progress Notes (Signed)
Physical Therapy Note  SATURATION QUALIFICATIONS: (This note is used to comply with regulatory documentation for home oxygen)  Patient Saturations on Room Air at Rest = 95%  Patient Saturations on Room Air while Ambulating = 91% (observed lowest)  Patient does not require supplemental oxygen to maintain oxygen saturations at acceptable, safe levels with physical activity.  (Full PT eval to follow)  OK for dc home from PT standpoint  Roney Marion, [place] Office [phone]

## 2022-08-16 NOTE — Evaluation (Signed)
Physical Therapy Evaluation and DC Patient Details Name: Lydia Griffin MRN: [id number] DOB: [birth year] Today's Date: 08/16/2022  History of Present Illness  61 year old female presents with acute hypoxemic respiratory failure due to community acquired pneumonia and likely exacerbation of underlying lung disease (asthma/COPD). HPI of severe asthma and HTN presenting with congestion, dyspnea, cough, and overall body aches. She additionally reports GI complaints. Chest pain only with coughing. Uses triple inhaler at home  Clinical Impression   Patient evaluated by Physical Therapy with no further acute PT needs identified. All education has been completed and the patient has no further questions.  See below for any follow-up Physical Therapy or equipment needs. PT is signing off. Thank you for this referral.        Recommendations for follow up therapy are one component of a multi-disciplinary discharge planning process, led by the attending physician.  Recommendations may be updated based on patient status, additional functional criteria and insurance authorization.  Follow Up Recommendations No PT follow up      Assistance Recommended at Discharge None  Patient can return home with the following       Equipment Recommendations None recommended by PT (though worth considering a pulse oximeter)  Recommendations for Other Services       Functional Status Assessment       Precautions / Restrictions Precautions Precautions: None Restrictions Weight Bearing Restrictions: No      Mobility  Bed Mobility Overal bed mobility: Independent                  Transfers Overall transfer level: Independent Equipment used: None                    Ambulation/Gait Ambulation/Gait assistance: Independent Gait Distance (Feet): 120 Feet Assistive device: None Gait Pattern/deviations: WFL(Within Functional Limits)       General Gait Details: Ambulated down hallway and  made loop twice to monitor spO2 levels  Stairs            Wheelchair Mobility    Modified Rankin (Stroke Patients Only)       Balance Overall balance assessment: Independent                                           Pertinent Vitals/Pain Pain Assessment Pain Assessment: No/denies pain    Home Living Family/patient expects to be discharged to:: Private residence Living Arrangements: Children   Type of Home: House Home Access: Stairs to enter   Secretary/administrator of Steps: 2   Home Layout: One level Home Equipment: None      Prior Function Prior Level of Function : Independent/Modified Independent               ADLs Comments: 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[employer])     Higher education careers adviser        Extremity/Trunk Assessment             Cervical / Trunk Assessment Cervical / Trunk Assessment: Normal  Communication      Cognition Arousal/Alertness: Awake/alert Behavior During Therapy: WFL for tasks assessed/performed Overall Cognitive Status: Within Functional Limits for tasks assessed  General Comments General comments (skin integrity, edema, etc.): See other PT note of this date for O2 sat walk    Exercises     Assessment/Plan    PT Assessment Patient does not need any further PT services  PT Problem List         PT Treatment Interventions      PT Goals (Current goals can be found in the Care Plan section)  Acute Rehab PT Goals Patient Stated Goal: To get a shower PT Goal Formulation: All assessment and education complete, DC therapy    Frequency       Co-evaluation               AM-PAC PT "6 Clicks" Mobility  Outcome Measure Help needed turning from your back to your side while in a flat bed without using bedrails?: None Help needed moving from lying on your back to sitting on the side of a flat bed without using bedrails?: None Help needed moving  to and from a bed to a chair (including a wheelchair)?: None Help needed standing up from a chair using your arms (e.g., wheelchair or bedside chair)?: None Help needed to walk in hospital room?: None Help needed climbing 3-5 steps with a railing? : None 6 Click Score: 24    End of Session   Activity Tolerance: Patient tolerated treatment well          Time: 4320-0379 PT Time Calculation (min) (ACUTE ONLY): 15 min   Charges:   PT Evaluation $PT Eval Low Complexity: Lydia Griffin, Lydia Griffin [phone]   Lydia Griffin 08/16/2022, 1:06 PM

## 2022-08-16 NOTE — Evaluation (Signed)
Occupational Therapy Evaluation Patient Details Name: Lydia Griffin MRN: [id number] DOB: [birth year] Today's Date: 08/16/2022   History of Present Illness 61 year old female presents with acute hypoxemic respiratory failure due to community acquired pneumonia and likely exacerbation of underlying lung disease (asthma/COPD). HPI of severe asthma and HTN presenting with congestion, dyspnea, cough, and overall body aches. She additionally reports GI complaints. Chest pain only with coughing. Uses triple inhaler at home   Clinical Impression   PTA, pt lived alone and was independent. Pt performing all ADL with independence at this time, so focusing session on education regarding energy conservation to optimize success with return to daily activity due to pt with decreased activity tolerance as compared to baseline. Providing handout and pt receptive and interactive throughout education provided with skill to provide insight into how strategies can be applied within her home setting. Recommending discharge home with no additional OT follow up at this time. OT to sign off; re-consult if change in status.      Recommendations for follow up therapy are one component of a multi-disciplinary discharge planning process, led by the attending physician.  Recommendations may be updated based on patient status, additional functional criteria and insurance authorization.   Follow Up Recommendations  No OT follow up    Assistance Recommended at Discharge PRN  Patient can return home with the following Assistance with cooking/housework    Functional Status Assessment  Patient has had a recent decline in their functional status and demonstrates the ability to make significant improvements in function in a reasonable and predictable amount of time.  Equipment Recommendations  None recommended by OT (Pt reports she thinks she has 3:1 for use in shower)    Recommendations for Other Services        Precautions / Restrictions Precautions Precautions: None Restrictions Weight Bearing Restrictions: No      Mobility Bed Mobility Overal bed mobility: Independent                  Transfers Overall transfer level: Independent Equipment used: None                      Balance Overall balance assessment: Independent                                         ADL either performed or assessed with clinical judgement   ADL Overall ADL's : Independent                                       General ADL Comments: Slightly decreased activity tolerance as compared to baseline. Providing energy conservation handout and reviewing with patient with encouragement of interospection into patient's home setting.     Vision Baseline Vision/History: 1 Wears glasses Ability to See in Adequate Light: 0 Adequate Patient Visual Report: No change from baseline Vision Assessment?: No apparent visual deficits     Perception     Praxis      Pertinent Vitals/Pain Pain Assessment Pain Assessment: No/denies pain     Hand Dominance     Extremity/Trunk Assessment Upper Extremity Assessment Upper Extremity Assessment: Overall WFL for tasks assessed   Lower Extremity Assessment Lower Extremity Assessment: Overall WFL for tasks assessed   Cervical / Trunk Assessment Cervical / Trunk Assessment:  Normal   Communication Communication Communication: No difficulties   Cognition Arousal/Alertness: Awake/alert Behavior During Therapy: WFL for tasks assessed/performed Overall Cognitive Status: Within Functional Limits for tasks assessed                                 General Comments: Very pleasant and conversational. Reviewing energy conservation and pt able to provide examples of how she can use these strategies within her home setting.     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[employer])        OT Problem List: Decreased strength;Decreased activity tolerance;Cardiopulmonary status limiting activity      OT Treatment/Interventions:      OT Goals(Current goals can be found in the care plan section) Acute Rehab OT Goals Patient Stated Goal: go home OT Goal Formulation: With patient  OT Frequency:      Co-evaluation              AM-PAC OT "6 Clicks" Daily Activity     Outcome Measure Help from another person eating meals?: None Help from another person taking care of personal grooming?: None Help from another person toileting, which includes using toliet, bedpan, or urinal?: None Help from another person bathing (including washing, rinsing, drying)?: None Help from another person to put on and taking off regular upper body clothing?: None Help from another person to put on and taking off regular lower body clothing?: None 6 Click Score: 24   End of Session Nurse Communication: Mobility status  Activity Tolerance: Patient tolerated treatment well Patient left: in chair;with call bell/phone within reach  OT Visit Diagnosis: Unsteadiness on feet (R26.81);Muscle weakness (generalized) (M62.81)                Time: 0300-9233 OT Time Calculation (min): 22 min Charges:  OT General Charges $OT Visit: 1 Visit OT Evaluation $OT Eval Low Complexity: 1 Low  Ladene Artist, OTR/L [facility] Acute  Rehabilitation Office: [phone]   Drue Novel 08/16/2022, 5:12 PM

## 2022-08-25 ENCOUNTER — Inpatient Hospital Stay: Payer: Self-pay | Admitting: Family Medicine

## 2022-08-29 ENCOUNTER — Telehealth: Payer: Self-pay

## 2022-08-29 NOTE — Telephone Encounter (Signed)
Submitted application for TRELEGY to [employer] for patient assistance.   Phone: [phone]  

## 2022-09-01 ENCOUNTER — Encounter: Payer: Self-pay | Admitting: Family Medicine

## 2022-09-01 ENCOUNTER — Ambulatory Visit (INDEPENDENT_AMBULATORY_CARE_PROVIDER_SITE_OTHER): Payer: Self-pay | Admitting: Family Medicine

## 2022-09-01 VITALS — BP 172/83 | HR 63 | Wt 163.0 lb

## 2022-09-01 DIAGNOSIS — R030 Elevated blood-pressure reading, without diagnosis of hypertension: Secondary | ICD-10-CM

## 2022-09-01 DIAGNOSIS — J439 Emphysema, unspecified: Secondary | ICD-10-CM

## 2022-09-01 DIAGNOSIS — J9601 Acute respiratory failure with hypoxia: Secondary | ICD-10-CM

## 2022-09-01 NOTE — Progress Notes (Signed)
    SUBJECTIVE:   CHIEF COMPLAINT / HPI:   [name] presents to clinic for hospital follow up from hospitalization on 10/23-10/25 . Hospital course below:  Lydia Griffin is a 61 y.o. female who presented with asthma/COPD exacerbation with PNA. PMH includes asthma and depression.    Acute respiratory failure 2/2 asthma/COPD and CAP Presented with persistent cough and congestion and GI symptoms (as below). She had a fever at home to 103.1. On exam, she was afebrile, borderline tachycardic, hypertensive, and requiring 4L [place] with inspiratory wheezing and diffuse rhonchi. CXR with PNA. She received rocephin, azithromycin, and methylprednisolone in ED. She was continued on abx, prednisone, duonebs, and home asthma/COPD medications with improvement. By discharge, she had symptom improvement, normal oxygen saturations on room air and transitioned to remaining course of cefdinir and prednisone.   Nausea vomiting and diarrhea likely 2/2 viral process Presented with history of multiple episodes of n/v/d. Admission exam as above and additionally she appeared dehydrated. Also endorsed BRBPR, though this had resolved by admission. Hgb stable at 13.6, LFTs mildly elevated, and both total and indirect bilirubin elevated. LDH also elevated. CTAP with diverticulosis that could have been blood etiology. Repeat hepatic function panel with completely normalized LFTs and bilirubin; suspect acute viral illness as cause. IVF initiated until PO resumed. By discharge, her GI symptoms had resolved and she was tolerating PO well.    Today she endorses feeling much improved. Had a bit of an elevated temp last week, around 99 but then went down. Does still endorse some stomach discomfort.  Feels breathing is ok but does endorse some wheezing. She state she can tell its going to rain from her breathing. Still has sample Trelegy she has been using. Needed to Albuterol yesterday.  Additionally endorses great improvement in her  mood on the Zoloft 100mg  daily   PERTINENT  PMH / PSH: Reviewed  OBJECTIVE:   BP (!) 172/83   Pulse 63   Wt 163 lb (73.9 kg)   SpO2 93%   BMI 26.31 kg/m    General: alert, pleasant, NAD CV: RRR no murmurs Resp: Mild diffuse wheezing in all fields bilaterally. Normal WOB GI: soft, non distended, non tender Derm: warm, dry. No edema   ASSESSMENT/PLAN:   Acute respiratory failure with hypoxia [facility]) Patient was hospitalized from 10/23-10/25 for COPD/Asthma exacerbation and CAP. She received abx, prednisone, duonebs, and home asthma/COPD medications with improvement. She notes continued improvement since hospitalization. Physical exam with mild diffuse wheezing and normal WOB. Will need repeat CXR in about 3-4 weeks and will order at follow up. Will also need PFTS and will forward to pharmacy team - pharm referral for PFTs - continue Trelegy daily and albuterol prn. Working with 05-19-1985 to get her approved - CMP and CBC - follow up in 2 weeks   Elevated blood pressure  BP today 172/83 but patient not on medication for HTN. Discussed taking measurements at home and follow back up in 2 weeks to potentially initiate medication.   Health maintenance  Discuss colonoscopy at follow up   Durward Mallard, DO [facility] Center

## 2022-09-01 NOTE — Patient Instructions (Signed)
It was great seeing you today!  You came in to follow-up from her hospitalization and I am glad you are feeling better!  We did check some your blood work, and we are working to get Engineer, maintenance.  Continue taking what you have now.  Schedule an appointment with the pharmacy team to do your pulmonary function test.  Your elevated blood pressure I would like you to take measurements first in the morning after you have been sitting in a chair with your feet flat on the ground without caffeine for a few days.  Bring these into her next follow-up appointment.  Your next appointment we will also schedule you for the repeat chest x-ray.  Please check-out at the front desk before leaving the clinic. I'd like to see you back in about 2 weeks, but if you need to be seen earlier than that for any new issues we're happy to fit you in, just give [place] a call!   Feel free to call with any questions or concerns at any time, at [phone].   Take care,  Dr. Cora Collum [facility] Center

## 2022-09-02 LAB — COMPREHENSIVE METABOLIC PANEL
ALT: 46 IU/L — ABNORMAL HIGH (ref 0–32)
AST: 36 IU/L (ref 0–40)
Albumin/Globulin Ratio: 1.5 (ref 1.2–2.2)
Albumin: 3.9 g/dL (ref 3.9–4.9)
Alkaline Phosphatase: 128 IU/L — ABNORMAL HIGH (ref 44–121)
BUN/Creatinine Ratio: 11 — ABNORMAL LOW (ref 12–28)
BUN: 9 mg/dL (ref 8–27)
Bilirubin Total: 0.3 mg/dL (ref 0.0–1.2)
CO2: 22 mmol/L (ref 20–29)
Calcium: 9.3 mg/dL (ref 8.7–10.3)
Chloride: 103 mmol/L (ref 96–106)
Creatinine, Ser: 0.82 mg/dL (ref 0.57–1.00)
Globulin, Total: 2.6 g/dL (ref 1.5–4.5)
Glucose: 88 mg/dL (ref 70–99)
Potassium: 4.5 mmol/L (ref 3.5–5.2)
Sodium: 139 mmol/L (ref 134–144)
Total Protein: 6.5 g/dL (ref 6.0–8.5)
eGFR: 81 mL/min/{1.73_m2} (ref >59)

## 2022-09-02 LAB — CBC
Hematocrit: 38.2 % (ref 34.0–46.6)
Hemoglobin: 12.6 g/dL (ref 11.1–15.9)
MCH: 28.6 pg (ref 26.6–33.0)
MCHC: 33 g/dL (ref 31.5–35.7)
MCV: 87 fL (ref 79–97)
Platelets: 422 10*3/uL (ref 150–450)
RBC: 4.41 x10E6/uL (ref 3.77–5.28)
RDW: 14.1 % (ref 11.7–15.4)
WBC: 5.7 10*3/uL (ref 3.4–10.8)

## 2022-09-04 DIAGNOSIS — R030 Elevated blood-pressure reading, without diagnosis of hypertension: Secondary | ICD-10-CM | POA: Insufficient documentation

## 2022-09-04 NOTE — Assessment & Plan Note (Signed)
Patient was hospitalized from 10/23-10/25 for COPD/Asthma exacerbation and CAP. She received abx, prednisone, duonebs, and home asthma/COPD medications with improvement. She notes continued improvement since hospitalization. Physical exam with mild diffuse wheezing and normal WOB. Will need repeat CXR in about 3-4 weeks and will order at follow up. Will also need PFTS and will forward to pharmacy team - pharm referral for PFTs - continue Trelegy daily and albuterol prn. Working with Durward Mallard to get her approved - CMP and CBC - follow up in 2 weeks

## 2022-09-04 NOTE — Assessment & Plan Note (Signed)
BP today 172/83 but patient not on medication for HTN. Discussed taking measurements at home and follow back up in 2 weeks to potentially initiate medication.

## 2022-10-18 ENCOUNTER — Telehealth: Payer: Self-pay

## 2022-10-18 NOTE — Telephone Encounter (Signed)
Patient calls nurse line requesting samples of Trelegy inhaler. She states that she is unable to afford medication and was told to call back when she needed additional medication.   She has one day left.   Will forward to pharmacy team.   [name], RN

## 2022-10-20 NOTE — Telephone Encounter (Signed)
Received notification from [employer] regarding approval for TRELEGY. Patient assistance approved from 10/09/22 to 10/09/23.  MEDICATION SHIPPED OUT TO PATIENT 10/18/22. MEDICATION WILL CONTINUE TO SHIP TO PT HOME  Phone: [phone]

## 2022-10-20 NOTE — Telephone Encounter (Signed)
Informed patient her re-enrollment with [employer] was approved and her Trelegy shipped 10/18/22. Medication should be arriving to her home any day now.   Pt expressed thanks and understanding.

## 2022-10-30 ENCOUNTER — Encounter: Payer: Self-pay | Admitting: Family Medicine

## 2022-10-30 ENCOUNTER — Ambulatory Visit (INDEPENDENT_AMBULATORY_CARE_PROVIDER_SITE_OTHER): Payer: Self-pay | Admitting: Family Medicine

## 2022-10-30 VITALS — BP 140/76 | HR 65 | Temp 97.9°F | Resp 16 | Ht 66.0 in | Wt 170.6 lb

## 2022-10-30 DIAGNOSIS — Z1211 Encounter for screening for malignant neoplasm of colon: Secondary | ICD-10-CM

## 2022-10-30 DIAGNOSIS — I10 Essential (primary) hypertension: Secondary | ICD-10-CM

## 2022-10-30 DIAGNOSIS — R5383 Other fatigue: Secondary | ICD-10-CM

## 2022-10-30 NOTE — Progress Notes (Unsigned)
    SUBJECTIVE:   CHIEF COMPLAINT / HPI:   Patient presents for BP follow up. She states her blood pressure has been up and down. She is hesitant about starting medication.  Open to ambulatory monitoring   Requesting Vit B12 shot. Does not believe she has had a diagnosis of Vit B12 deficiency. Was taking oral tablets.   Feeling well with the Zoloft. States she can feel a difference if she misses days. Has had some recent stressors with nephew being in the hospital.   PERTINENT  PMH / PSH: Reviewed   OBJECTIVE:   BP (!) 140/76 (BP Location: Right Arm, Patient Position: Sitting, Cuff Size: Large)   Pulse 65   Temp 97.9 F (36.6 C)   Resp 16   Ht 5\' 6"  (1.676 m)   Wt 170 lb 9.6 oz (77.4 kg)   SpO2 96%   BMI 27.54 kg/m    Physical exam General: well appearing, NAD Cardiovascular: RRR, no murmurs Lungs: CTAB. Normal WOB Abdomen: soft, non-distended, non-tender Skin: warm, dry. No edema  ASSESSMENT/PLAN:   Hypertension BP 140/76 on repeat today. Unclear of what home blood pressure is like. Hesitant about starting medication. Discussed multiple elevated blood pressure measurements in past visits. Also discussed what could happen if blood pressure remains uncontrolled.  She is open to ambulatory blood pressure monitoring and then starting medication. Follow up appointment scheduled with pharmacy team to get set up with the monitoring.    Health maintenance - Patient set up with FIT testing given no insurance and most affordable CRC testing  - Given patient's request for Vit B12 injection will check levels to see if indicated. Will also check TSH given low in the past.   Shary Key, [place]

## 2022-10-30 NOTE — Patient Instructions (Signed)
It was great seeing you today!  Today we are checking Vit B12 and your thyroid.  We are also doing your colon cancer screening.   Please check-out at the front desk before leaving the clinic. Schedule a pharmacy appointment for ambulatory blood pressure monitoring, but if you need to be seen earlier than that for any new issues we're happy to fit you in, just give [place] a call!  Feel free to call with any questions or concerns at any time, at [phone].   Take care,  Dr. Shary Key [facility] Medicine Center

## 2022-10-31 DIAGNOSIS — I10 Essential (primary) hypertension: Secondary | ICD-10-CM | POA: Insufficient documentation

## 2022-10-31 LAB — TSH RFX ON ABNORMAL TO FREE T4: TSH: 0.652 u[IU]/mL (ref 0.450–4.500)

## 2022-10-31 LAB — VITAMIN B12: Vitamin B-12: 574 pg/mL (ref 232–1245)

## 2022-10-31 NOTE — Assessment & Plan Note (Signed)
BP 140/76 on repeat today. Unclear of what home blood pressure is like. Hesitant about starting medication. Discussed multiple elevated blood pressure measurements in past visits. Also discussed what could happen if blood pressure remains uncontrolled.  She is open to ambulatory blood pressure monitoring and then starting medication. Follow up appointment scheduled with pharmacy team to get set up with the monitoring.

## 2022-11-30 ENCOUNTER — Ambulatory Visit (INDEPENDENT_AMBULATORY_CARE_PROVIDER_SITE_OTHER): Payer: Self-pay | Admitting: Pharmacist

## 2022-11-30 VITALS — BP 140/81 | Wt 172.6 lb

## 2022-11-30 DIAGNOSIS — J439 Emphysema, unspecified: Secondary | ICD-10-CM

## 2022-11-30 DIAGNOSIS — R030 Elevated blood-pressure reading, without diagnosis of hypertension: Secondary | ICD-10-CM

## 2022-11-30 DIAGNOSIS — I10 Essential (primary) hypertension: Secondary | ICD-10-CM

## 2022-11-30 NOTE — Progress Notes (Signed)
S:     Chief Complaint  Patient presents with   Medication Management    Ambulatory BP monitoring   62 y.o. female who presents for hypertension evaluation, education, and management.  PMH is significant for asthma, depression, and recent admission for acute respiratory failure secondary to asthma/COPD and CAP.  Patient was referred and last seen by Primary Care Provider, Dr. Arby Barrette, on 10/30/2022.   At last visit, initial BP was 152/68 mmHg and second check was 140/76 mmHg. Patient was referred to pharmacy clinic for ambulatory BP monitoring.    Patient denies a history of high blood pressure. Reports home BP this morning was 127/71, other readings are elevated at 160s/80s. 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 Endorses breathing has been well controlled since being discharged and reports daily adherence to Trelegy. Denies tobacco use.   Personal & family history:  MI/stroke- none HTN- mother, sister Tobacco: none   Discussed procedure for wearing the monitor and gave patient written instructions. Monitor was placed on non-dominant arm with instructions to return in the morning.   Current BP Medications include:  none  Day #2 - Patient returns to clinic and reports an event that potentially contributed to her elevated blood pressure (concerning call from son). Patient reports they were able to wear the Ambulatory Blood Pressure Cuff for the entire 24 evaluation period.   O:  Review of Systems  All other systems reviewed and are negative.   Physical Exam Constitutional:      Appearance: Normal appearance.  Pulmonary:     Effort: Pulmonary effort is normal.  Neurological:     Mental Status: She is alert.  Psychiatric:        Mood and Affect: Mood normal.        Behavior: Behavior normal.        Thought Content: Thought content normal.     Last 3 Office BP readings: BP Readings from Last 3 Encounters:  10/30/22 (!) 140/76  09/01/22 (!)  172/83  08/16/22 (!) 145/77    Clinical Atherosclerotic Cardiovascular Disease (ASCVD): No  The 10-year ASCVD risk score (Arnett DK, et al., 2019) is: 9.3%   Values used to calculate the score:     Age: 67 years     Sex: Female     Is Non-Hispanic African American: Yes     Diabetic: No     Tobacco smoker: No     Systolic Blood Pressure: [id number] mmHg     Is BP treated: Yes     HDL Cholesterol: 76 mg/dL     Total Cholesterol: 223 mg/dL  Basic Metabolic Panel    Component Value Date/Time   NA 139 09/01/2022 1041   K 4.5 09/01/2022 1041   CL 103 09/01/2022 1041   CO2 22 09/01/2022 1041   GLUCOSE 88 09/01/2022 1041   GLUCOSE 108 (H) 08/16/2022 0811   BUN 9 09/01/2022 1041   CREATININE 0.82 09/01/2022 1041   CALCIUM 9.3 09/01/2022 1041   GFRNONAA >60 08/16/2022 0811   GFRAA >60 12/20/2019 1228      ABPM Study Data: Arm Placement left arm  Overall Mean 24hr BP:   130/73 mmHg  HR: 80  Daytime Mean BP:  140/81 mmHg  HR: 85  Nighttime Mean BP:  112/59 mmHg  HR: 70  Dipping Pattern: Yes.    Sys:   20.4%   Dia: 26.7%   [normal dipping ~10-20%]  For Office Goal Goal  BP of <130/80:  ABPM thresholds: Overall BP < 125/75, daytime BP <130/80 mmHg, sleeptime BP <110/65 mmHg   A/P: History of newly diagnosed hypertension currently unmedicated; with goal presssure of <130/80. Found to have persistently elevated and uncontrolled blood pressure with 24-hour ambulatory blood pressure evaluation which demonstrates an average AWAKE blood pressure of 140/81 mmHg. Nocturnal dipping pattern is normal.   Changes to medications - START amlodipine 2.5 mg daily  Asthma/COPD overlap - controlled on Trelegy per patient report. She receives Trelegy through PAP. May consider decreasing Trelegy to 100-62.5-25 given recent admission for CAP at next PCP visit.  - Continue current dose of Trelegy at this time.    Results reviewed and written information provided.    Written patient instructions provided.  Patient verbalized understanding of treatment plan.  Total time in face to face counseling 40 minutes.    Follow-up:  Primary Care Provider in 4-6 weeks.  Patient seen with Dixon Boos,  PharmD Candidate, Francena Hanly, PharmD PGY-1 Pharmacy Resident and Joseph Art, PharmD, PGY2 Pharmacy Resident.

## 2022-11-30 NOTE — Patient Instructions (Signed)
It was nice to see you today!  Your goal blood pressure is less than 130/80 mmHg.  Medication Changes: Begin amlodipine (Norvasc) 2.5 mg once daily    Monitor blood pressure at home daily and keep a log (on your phone or piece of paper) to bring with you to your next visit. Write down date, time, blood pressure and pulse.  Keep up the good work with diet and exercise. Aim for a diet full of vegetables, fruit and lean meats (chicken, [place], fish). Try to limit salt intake by eating fresh or frozen vegetables (instead of canned), rinse canned vegetables prior to cooking and do not add any additional salt to meals.

## 2022-12-01 ENCOUNTER — Encounter: Payer: Self-pay | Admitting: Pharmacist

## 2022-12-01 MED ORDER — AMLODIPINE BESYLATE 2.5 MG PO TABS: 2.5000 mg | ORAL_TABLET | Freq: Every day | ORAL | 3 refills | Status: DC

## 2022-12-01 NOTE — Assessment & Plan Note (Deleted)
History of newly diagnosed hypertension currently unmedicated; with goal presssure of <130/80. Found to have persistently elevated and uncontrolled blood pressure with 24-hour ambulatory blood pressure evaluation which demonstrates an average AWAKE blood pressure of 140/81 mmHg. Nocturnal dipping pattern is normal.   Changes to medications - START amlodipine 2.5 mg daily

## 2022-12-01 NOTE — Assessment & Plan Note (Signed)
History of newly diagnosed hypertension currently unmedicated; with goal presssure of <130/80. Found to have persistently elevated and uncontrolled blood pressure with 24-hour ambulatory blood pressure evaluation which demonstrates an average AWAKE blood pressure of 140/81 mmHg. Nocturnal dipping pattern is normal.   Changes to medications - START amlodipine 2.5 mg daily

## 2022-12-01 NOTE — Assessment & Plan Note (Signed)
Asthma/COPD overlap - controlled on Trelegy per patient report. She receives Trelegy through PAP. May consider decreasing Trelegy to 100-62.5-25 given recent admission for CAP at next PCP visit.  - Continue current dose of Trelegy at this time.

## 2022-12-04 NOTE — Progress Notes (Signed)
Reviewed: I agree with Dr. Koval's documentation and management. 

## 2022-12-18 ENCOUNTER — Ambulatory Visit: Payer: No Typology Code available for payment source | Admitting: Family Medicine

## 2022-12-18 NOTE — Progress Notes (Deleted)
    SUBJECTIVE:   CHIEF COMPLAINT / HPI:   ***  PERTINENT  PMH / PSH: COPD, Losing et,  Patient Active Problem List   Diagnosis Date Noted   Paraseptal Emphysema of lung ([place]) 03/25/2020    Priority: High   Hypertension 10/31/2022   Elevated blood pressure reading 09/04/2022   Unconjugated hyperbilirubinemia 08/14/2022   Severe sepsis ([place]) 01/22/2021   Chronic obstructive pulmonary disease ([place]) 01/22/2021   Acute asthma exacerbation 01/21/2021   Acute respiratory failure with hypoxia ([place]) 01/21/2021   Inhaled foreign object 01/02/2020   Losing weight 07/05/2018   Generalized anxiety disorder 07/05/2018   Asthma exacerbation 05/02/2017   Community acquired pneumonia of left lower lobe of lung 05/15/2012   Nausea vomiting and diarrhea 01/26/2011   Hypokalemia 03/21/2010   Asthma, chronic 11/02/2009   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    Current Outpatient Medications  Medication Instructions   albuterol (VENTOLIN HFA) 108 (90 Base) MCG/ACT inhaler 2 puffs, Inhalation, Every 6 hours PRN   amLODipine (NORVASC) 2.5 mg, Oral, Daily   Cyanocobalamin (VITAMIN B 12 PO) 1,000 mcg, Oral, Daily   dimenhyDRINATE (DRAMAMINE) 50 mg, Oral, Every 8 hours PRN   Fluticasone-Umeclidin-Vilant (TRELEGY ELLIPTA) 200-62.5-25 MCG/ACT AEPB 1 puff, Inhalation, Daily   ipratropium-albuterol (DUONEB) 0.5-2.5 (3) MG/3ML SOLN USE 1 VIAL VIA NEBULIZER EVERY 4 HOURS AS NEEDED   montelukast (SINGULAIR) 10 mg, Oral, Daily at bedtime   Multiple Vitamins-Minerals (MULTIVITAMIN WITH MINERALS) tablet 1 tablet, Oral, Daily   prochlorperazine (COMPAZINE) 5 mg, Oral, Every 6 hours PRN   sertraline (ZOLOFT) 100 mg, Oral, Daily       12/01/2022    2:40 PM 10/30/2022    4:12 PM 10/30/2022    4:05 PM  Vitals with BMI  Height   5' 6"$   Weight 172 lbs 10 oz  170 lbs 10 oz  BMI   [id number]  Systolic [id number]  Diastolic 81 76 68  Pulse   65      OBJECTIVE:   There were  no vitals taken for this visit.  ***  ASSESSMENT/PLAN:   There are no diagnoses linked to this encounter.   There are no Patient Instructions on file for this visit.   Lind Covert, MD [place]

## 2023-03-14 ENCOUNTER — Other Ambulatory Visit (HOSPITAL_COMMUNITY): Payer: Self-pay

## 2023-03-14 ENCOUNTER — Ambulatory Visit (INDEPENDENT_AMBULATORY_CARE_PROVIDER_SITE_OTHER): Payer: Self-pay | Admitting: Student

## 2023-03-14 ENCOUNTER — Ambulatory Visit: Payer: Self-pay

## 2023-03-14 VITALS — BP 135/81 | HR 81 | Ht 66.0 in | Wt 169.4 lb

## 2023-03-14 DIAGNOSIS — J4551 Severe persistent asthma with (acute) exacerbation: Secondary | ICD-10-CM

## 2023-03-14 DIAGNOSIS — R110 Nausea: Secondary | ICD-10-CM

## 2023-03-14 MED ORDER — PREDNISONE 20 MG PO TABS
40.0000 mg | ORAL_TABLET | Freq: Every day | ORAL | 0 refills | Status: AC
  Filled 2023-03-14: qty 8, 4d supply, fill #0

## 2023-03-14 MED ORDER — ALBUTEROL SULFATE (2.5 MG/3ML) 0.083% IN NEBU
2.5000 mg | INHALATION_SOLUTION | Freq: Once | RESPIRATORY_TRACT | Status: AC
  Administered 2023-03-14: 2.5 mg via RESPIRATORY_TRACT

## 2023-03-14 MED ORDER — IPRATROPIUM BROMIDE 0.02 % IN SOLN
0.5000 mg | Freq: Once | RESPIRATORY_TRACT | Status: AC
  Administered 2023-03-14: 0.5 mg via RESPIRATORY_TRACT

## 2023-03-14 MED ORDER — MONTELUKAST SODIUM 10 MG PO TABS
10.0000 mg | ORAL_TABLET | Freq: Every day | ORAL | 3 refills | Status: DC
  Filled 2023-03-14: qty 30, 30d supply, fill #0
  Filled 2023-05-11: qty 30, 30d supply, fill #1

## 2023-03-14 MED ORDER — METHYLPREDNISOLONE SODIUM SUCC 125 MG IJ SOLR
125.0000 mg | Freq: Once | INTRAMUSCULAR | Status: AC
  Administered 2023-03-14: 125 mg via INTRAMUSCULAR

## 2023-03-14 MED ORDER — ONDANSETRON HCL 4 MG/2ML IJ SOLN
4.0000 mg | Freq: Once | INTRAMUSCULAR | Status: AC
  Administered 2023-03-14: 4 mg via INTRAMUSCULAR

## 2023-03-14 MED ORDER — METHYLPREDNISOLONE SODIUM SUCC 40 MG IJ SOLR: 40.0000 mg | Freq: Once | INTRAMUSCULAR | Status: DC

## 2023-03-14 NOTE — Assessment & Plan Note (Signed)
62 y.o. female with history of Asthma presents with concerns for Asthma exacerbation likely secondary to ongoing viral illness. Reports coughing and dyspnea. On exam well appearing and found to have diffused inspiratory and expiratory wheezing. History and exam is consistent with asthma exercabtion. Given duoneb treatment in clinic and discussed treatment plan with patient -Administered to 125 mg of Solu-Medrol -Continue daily Trelegy -Use 2-4 puff of Albuterol every 4-6 hours for 24hr and PRN afterwards, return if no improvement -Rx patient prednisone 40mg  for 4 days  -Reviewed daily singular with 90-day prescription. -Discussed strict return precaution  -Close follow-up in 2 days.  If no improvement could consider checks x-ray.

## 2023-03-14 NOTE — Progress Notes (Cosign Needed Addendum)
    SUBJECTIVE:   CHIEF COMPLAINT / HPI:   Patient is a 62 year old female with known history of severe asthma presenting today for asthma exacerbation for the past 5 days.  She was recently at a kids party 5 days ago and shortly after started having chills, cough and congestion.  So far  she has used her Trelegy, Claritin, albuterol inhaler and nebulizer with no significant improvement.  Has not been on her Singulair due to loss of insurance after losing her job but already applied for the [employer] card. .Last asthma exacebation was last year summer and usually have multiple asthma attacks during this time of the day.    PERTINENT  PMH / PSH: Reviewed  OBJECTIVE:   BP 135/81   Pulse 81   Ht 5\' 6"  (1.676 m)   Wt 169 lb 6.4 oz (76.8 kg)   SpO2 92%   BMI 27.34 kg/m    Physical Exam General: Alert, well appearing, NAD Cardiovascular: RRR, No Murmurs, Normal S2/S2 Respiratory: Diffuse inspiratory and expiratory wheezing, labored speech but in full sentences Extremities: No edema on extremities    ASSESSMENT/PLAN:   Asthma exacerbation 62 y.o. female with history of Asthma presents with concerns for Asthma exacerbation likely secondary to ongoing viral illness. Reports coughing and dyspnea. On exam well appearing and found to have diffused inspiratory and expiratory wheezing. History and exam is consistent with asthma exercabtion. Given duoneb treatment in clinic with improvement and discussed treatment plan with patient -Administered to 125 mg of Solu-Medrol -Continue daily Trelegy -Use 2-4 puff of Albuterol every 4-6 hours for 24hr and PRN afterwards, return if no improvement -Rx  prednisone 40mg  for 4 days  -Refilled daily singular with 90-day prescription. -Discussed strict return precaution  -Close follow-up in 2 days.  If no improvement could consider checks x-ray.    Loss of insurance Patient lost her insurance due to being out of work.  Due to cost she has been  unable to obtain some of her medications such as Singulair. -Placed referral to care manager, ref [phone] to assist with obtaining insurance.   Jerre Simon, MD [facility]

## 2023-03-14 NOTE — Patient Instructions (Addendum)
It was wonderful to meet you today. Thank you for allowing me to be a part of your care. Below is a short summary of what we discussed at your visit today:  Your exam showed that you are currently having an asthma attack.  Your asthma attack is most likely triggered by ongoing viral illness.  Today we gave you injections for Solu-Medrol which is steroid and Zofran for nausea.  You also received duoneb treatments.  I have placed order for prednisone 40 mg which you will take once daily for 4 days starting tomorrow and refilled your Singulair.  Continue albuterol 4-6 puffs with spacer every 4 hours for the next 2 days. Then use as needed for cough, wheezing, or shortness of breath.   Go to ED if you feel like your breathing is getting worse and you are having difficulty breathing.  Also if you are not responding well to treatment.  I have placed in referral to social work to help you with the Medicaid application on or securing health insurance.  Please follow-up in 2 days.   Please bring all of your medications to every appointment!  If you have any questions or concerns, please do not hesitate to contact us via phone or MyChart message.   Jerre Simon, MD [facility]

## 2023-03-16 ENCOUNTER — Other Ambulatory Visit (HOSPITAL_COMMUNITY): Payer: Self-pay

## 2023-03-16 ENCOUNTER — Ambulatory Visit
Admission: RE | Admit: 2023-03-16 | Discharge: 2023-03-16 | Disposition: A | Discharge status: Home / Self Care | Payer: No Typology Code available for payment source | Source: Ambulatory Visit | Attending: Family Medicine

## 2023-03-16 ENCOUNTER — Ambulatory Visit (INDEPENDENT_AMBULATORY_CARE_PROVIDER_SITE_OTHER): Payer: Self-pay | Admitting: Family Medicine

## 2023-03-16 ENCOUNTER — Encounter: Payer: Self-pay | Admitting: Family Medicine

## 2023-03-16 ENCOUNTER — Other Ambulatory Visit: Payer: Self-pay

## 2023-03-16 VITALS — BP 131/87 | HR 84 | Ht 66.0 in | Wt 166.4 lb

## 2023-03-16 DIAGNOSIS — J4551 Severe persistent asthma with (acute) exacerbation: Secondary | ICD-10-CM

## 2023-03-16 MED ORDER — AMOXICILLIN-POT CLAVULANATE 875-125 MG PO TABS
1.0000 | ORAL_TABLET | Freq: Two times a day (BID) | ORAL | 0 refills | Status: DC
  Filled 2023-03-16: qty 20, 10d supply, fill #0

## 2023-03-16 MED ORDER — ONDANSETRON 4 MG PO TBDP
4.0000 mg | ORAL_TABLET | Freq: Three times a day (TID) | ORAL | 0 refills | Status: DC | PRN
  Filled 2023-03-16: qty 10, 4d supply, fill #0

## 2023-03-16 NOTE — Patient Instructions (Addendum)
Our recommendation would be for you to go to the Emergency Department.   Since you do not want to go- I have ordered a chest x-ray.  You do not need an appointment, just go right on over. [facility]: [address]  [phone]  I have also sent in Antibiotics and medication for your nausea.   If you feel any worse, it is important that you be evaluated in the Emergency Department.   Thank you for coming to your visit as scheduled. We have had a large "no-show" problem lately, and this significantly limits our ability to see and care for patients. As a friendly reminder- if you cannot make your appointment please call to cancel. We do have a no show policy for those who do not cancel within 24 hours. Our policy is that if you miss or fail to cancel an appointment within 24 hours, 3 times in a 58-month period, you may be dismissed from our clinic.   Thank you for choosing [facility].   Please call [phone] with any questions about today's appointment.  Please be sure to schedule follow up at the front  desk before you leave today.   Sabino Dick, DO PGY-3 Family Medicine

## 2023-03-16 NOTE — Progress Notes (Addendum)
    SUBJECTIVE:   CHIEF COMPLAINT / HPI:   Lydia Griffin is a 62 y.o. female who presents to the [facility] clinic today to discuss the following concerns:   F/u Asthma Exacerbation  Last seen in our clinic on 5/22.  On examination she had diffuse inspiratory Nexa Tory wheezing and some labored speech.  She was given a dose of 125 mg of Solu-Medrol, advised to continue her daily Trelegy, rescue inhaler.  She is also given prescription for 40 mg prednisone x 4 days and singulair prescription was refilled.  She presents today for follow-up.  She reports that she is not feeling well. She has felt nauseous and has been vomiting (3x yesterday). She is not sure if she is keeping down her Prednisone. Hard to take deep breaths. Hurts "just a tiny bit" to breathe deeply. Also having a productive green cough. Has not had a fever.  No abdominal pain.   Taking Singulair, Trelegy. Using albuterol every 4 hours.  Non-smoker.   PERTINENT  PMH / PSH: Hypertension, asthma, COPD, GAD  OBJECTIVE:   BP (!) 148/75   Pulse 85   Ht 5\' 6"  (1.676 m)   Wt 166 lb 6.4 oz (75.5 kg)   SpO2 90%   BMI 26.86 kg/m   Vitals:   03/16/23 1355  BP: (!) 148/75  Pulse: 85  SpO2: 90%  Ambulatory pulse ox 93-95%  General: NAD, pleasant, able to participate in exam Cardiac: RRR, no murmurs. Respiratory: Tachypnea, inspiratory and expiratory wheezing in all lung fields, no retractions or crackles  Abdomen: Bowel sounds present, nontender, soft, non-distended Extremities: no edema  Psych: Normal affect and mood  ASSESSMENT/PLAN:   1. Severe persistent asthma with exacerbation Patient feels worse despite starting steroid treatment.  She is tachypneic with continued diffuse wheezing.  She has had history of recurrent exacerbations requiring hospitalization in the past.  Amatory pulse ox today ranging 93 to 95%.  Given her continued symptoms, would recommend ED evaluation and likely admission. Called ED to discuss. FMTS team  is aware. When discussing this with patient- she was not amenable to plan.     Sabino Dick, DO [place] [facility]

## 2023-03-16 NOTE — Addendum Note (Signed)
Addended by: Sabino Dick on: 03/16/2023 02:49 PM   Modules accepted: Orders

## 2023-03-21 ENCOUNTER — Other Ambulatory Visit: Payer: No Typology Code available for payment source

## 2023-03-21 NOTE — Patient Outreach (Signed)
Patient asked for a call at 1:00 today, because she was driving. BSW will contact patient at 1.  Abelino Derrick, MHA [facility]  Managed [facility] Social Worker [phone]

## 2023-03-29 ENCOUNTER — Other Ambulatory Visit (HOSPITAL_COMMUNITY): Payer: Self-pay

## 2023-03-29 ENCOUNTER — Ambulatory Visit (INDEPENDENT_AMBULATORY_CARE_PROVIDER_SITE_OTHER): Payer: Self-pay | Admitting: Family Medicine

## 2023-03-29 ENCOUNTER — Encounter: Payer: Self-pay | Admitting: Family Medicine

## 2023-03-29 VITALS — BP 138/76 | HR 80 | Ht 66.0 in | Wt 175.0 lb

## 2023-03-29 DIAGNOSIS — M25512 Pain in left shoulder: Secondary | ICD-10-CM

## 2023-03-29 MED ORDER — BACLOFEN 10 MG PO TABS: 5.0000 mg | ORAL_TABLET | Freq: Three times a day (TID) | ORAL | 0 refills | Status: DC | PRN

## 2023-03-29 MED ORDER — BACLOFEN 10 MG PO TABS
5.0000 mg | ORAL_TABLET | Freq: Three times a day (TID) | ORAL | 0 refills | Status: DC | PRN
  Filled 2023-03-29: qty 7, 5d supply, fill #0

## 2023-03-29 NOTE — Progress Notes (Addendum)
    SUBJECTIVE:   CHIEF COMPLAINT / HPI:   VM is a 62yo F p/f acute L shoulder pain.  - Started Monday (3-4days ago). Denies any preceding trauma or strenuous activity. - Gradually got worse. "Throbbing nature", 7/10 pain. Pain is so bad it wakes her up at night. - Icing it, Tylenol, advil, and lidocaine patches mildly helpful, but doesn't get rid of the pain completely. Reports feeling nauseous after applying multiple lidocaine patches along her L upper arm and shoulder. - Worried about heart palpitations and thought it could be. Had normal HR at home.   PERTINENT  PMH / PSH: asthma  OBJECTIVE:   BP 138/76   Pulse 80   Ht 5\' 6"  (1.676 m)   Wt 79.4 kg   SpO2 98%   BMI 28.25 kg/m   Gen: Alert, uncomfortable appearing woman.  HEENT: NCAT. MMM. CV: skin warm and well perfused Resp: Normal WOB on RA MSK: Diffusely tender to palpation along L deltoid and L posterolateral aspect of neck. No point tenderness. Normal ROM of neck and shoulder. Crepitus of L shoulder with abduction and extension.   ASSESSMENT/PLAN:   Left shoulder pain 3-4 day hx of acute L deltoid pain, "throbbing" nature. No preceding trauma. Most likely muscle spasm or other MSK injury given reproducibility on exam and acute onset. ACS is considered, but less likely as vitals are stable, no SOB, and reproducibility on exam. Lytic lesions are considered since pt report pain wakes her from sleep, but less likely given acute onset and lack of focal tenderness. Has tried icing, tylenol, ibuprofen, and lidocaine which have helped but pain is still uncontrolled. Pt had benefit a few years ago with flexeril and wants something similar. - Baclofen 5mg  TID prn for 14 doses - Encouraged pt to alternate Tylenol and ibuprofen scheduled to have better pain control - Encouraged pt to use 1 lidocaine patch every 24hrs (was getting nauseous when she tried to apply multiple at once) - Encouraged to alternate icing and heat pad -  Advised to avoid strenuous activity w/ L arm while pain is still uncontrolled. - If pain uncontrolled in 2 weeks, advised to return. Should consider imaging at that point.   Lincoln Brigham, MD [facility]

## 2023-03-29 NOTE — Patient Instructions (Signed)
Good to see you today - Thank you for coming in  Things we discussed today:  1) For your shoulder pain, this is most likely due to a muscle sprain or spasm. - Take Baclofen 5mg  (half a tablet) up to three times a day as needed for pain. I am giving you 14 doses which should last at least 4 days.  -This can make you drowsy. You should avoid driving when using this drug. - Alternate Tylenol(1000mg ) and ibuprofen(400mg ) every 3 hours.  - Use 1 lidocaine patch once a day - Take hot baths or showers to help relax the muscle - Avoid strenuous activity that could put more stress on your shoulder  Please always bring your medication bottles  Come back to see me in 2 weeks if this pain is not improving.

## 2023-03-30 DIAGNOSIS — M25512 Pain in left shoulder: Secondary | ICD-10-CM | POA: Insufficient documentation

## 2023-03-30 NOTE — Assessment & Plan Note (Signed)
3-4 day hx of acute L deltoid pain, "throbbing" nature. No preceding trauma. Most likely muscle spasm or other MSK injury given reproducibility on exam and acute onset. ACS is considered, but less likely as vitals are stable, no SOB, and reproducibility on exam. Lytic lesions are considered since pt report pain wakes her from sleep, but less likely given acute onset and lack of focal tenderness. Has tried icing, tylenol, ibuprofen, and lidocaine which have helped but pain is still uncontrolled. Pt had benefit a few years ago with flexeril and wants something similar. - Baclofen 5mg  TID prn for 14 doses - Encouraged pt to alternate Tylenol and ibuprofen scheduled to have better pain control - Encouraged pt to use 1 lidocaine patch every 24hrs (was getting nauseous when she tried to apply multiple at once) - Encouraged to alternate icing and heat pad - Advised to avoid strenuous activity w/ L arm while pain is still uncontrolled. - If pain uncontrolled in 2 weeks, advised to return. Should consider imaging at that point.

## 2023-04-28 IMAGING — DX DG CHEST 2V
2 series · 2 of 2 positions shown · non-contrast
Comparison: Chest CT 01/23/2021, chest radiograph 01/21/2021

CLINICAL DATA: Shortness of breath

EXAM:
CHEST - 2 VIEW

[chest pa]
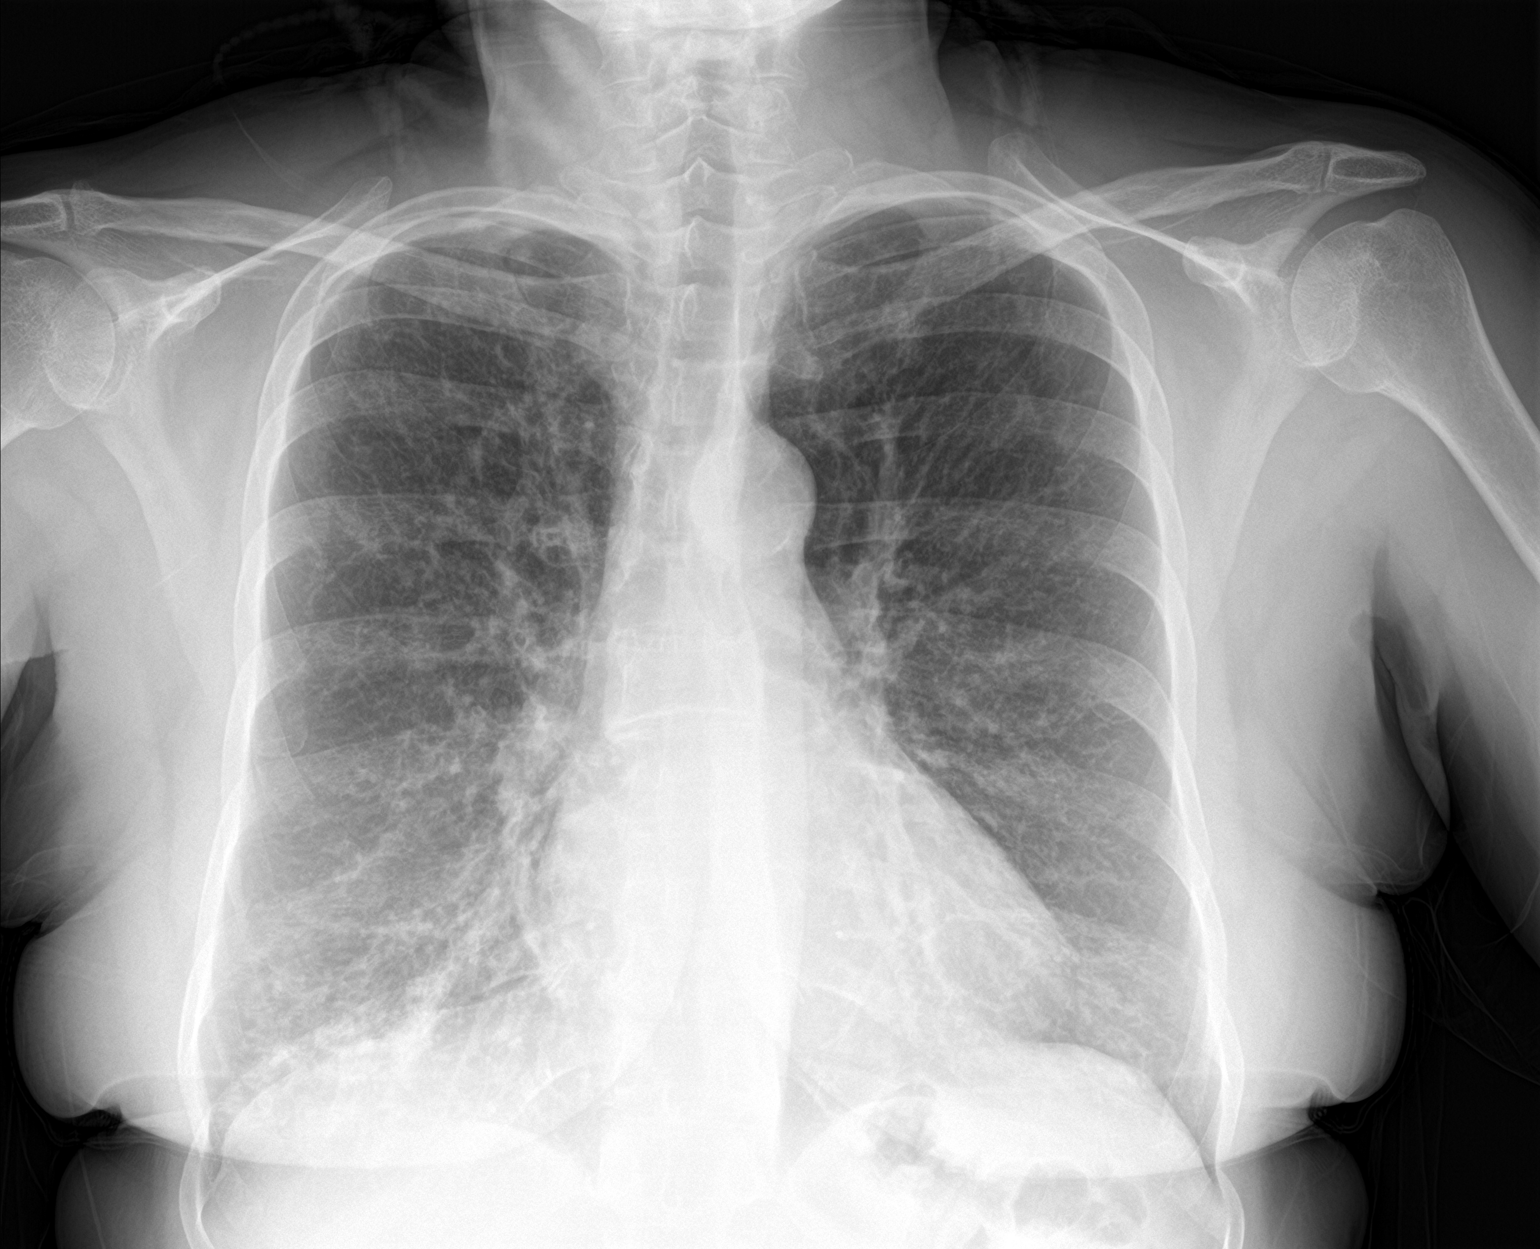

[chest lat]
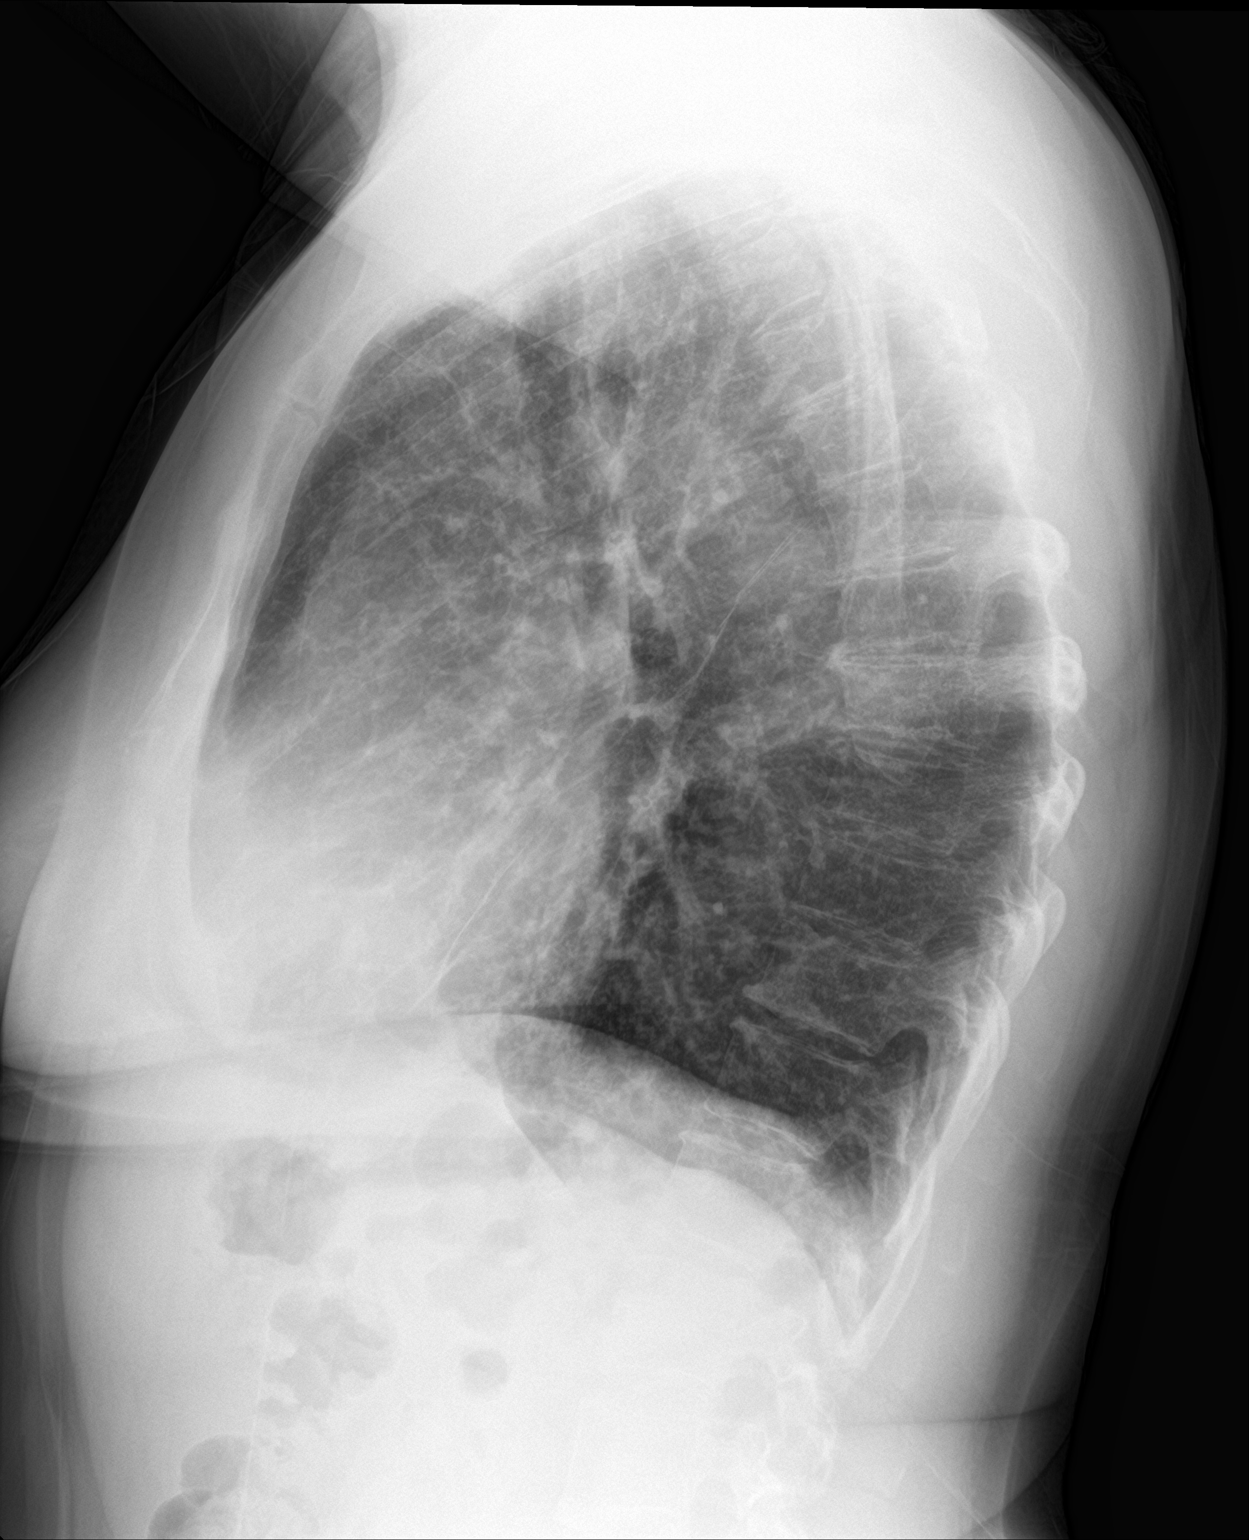

[2 of 2 positions shown; findings below may reference images not displayed]

FINDINGS: The cardiomediastinal silhouette is within normal limits.

There is peribronchial thickening bilaterally consistent with
findings on the prior CT chest from 01/23/2021. There are additional
patchy opacities in the right lower lung on the frontal projection,
likely in the right middle lobe given lack of findings on the
lateral projection. The upper lungs are clear. There is no pleural
effusion. There is no pneumothorax.

There is no acute osseous abnormality.
IMPRESSION: Patchy opacities in the right middle lobe may reflect pneumonia
superimposed on chronic bronchitic change.

## 2023-04-30 DIAGNOSIS — I10 Essential (primary) hypertension: Secondary | ICD-10-CM | POA: Diagnosis not present

## 2023-04-30 DIAGNOSIS — Z973 Presence of spectacles and contact lenses: Secondary | ICD-10-CM | POA: Diagnosis not present

## 2023-04-30 DIAGNOSIS — J45909 Unspecified asthma, uncomplicated: Secondary | ICD-10-CM | POA: Diagnosis not present

## 2023-04-30 DIAGNOSIS — R32 Unspecified urinary incontinence: Secondary | ICD-10-CM | POA: Diagnosis not present

## 2023-04-30 DIAGNOSIS — Z7951 Long term (current) use of inhaled steroids: Secondary | ICD-10-CM | POA: Diagnosis not present

## 2023-04-30 DIAGNOSIS — Z8249 Family history of ischemic heart disease and other diseases of the circulatory system: Secondary | ICD-10-CM | POA: Diagnosis not present

## 2023-05-11 ENCOUNTER — Telehealth: Payer: Self-pay

## 2023-05-11 ENCOUNTER — Other Ambulatory Visit (HOSPITAL_COMMUNITY): Payer: Self-pay

## 2023-05-11 DIAGNOSIS — J439 Emphysema, unspecified: Secondary | ICD-10-CM

## 2023-05-11 MED ORDER — TIOTROPIUM BROMIDE MONOHYDRATE 18 MCG IN CAPS: 18.0000 ug | ORAL_CAPSULE | Freq: Every day | RESPIRATORY_TRACT | 12 refills | Status: DC

## 2023-05-11 MED ORDER — FLUTICASONE FUROATE-VILANTEROL 100-25 MCG/ACT IN AEPB: 1.0000 | INHALATION_SPRAY | Freq: Every day | RESPIRATORY_TRACT | 3 refills | Status: DC

## 2023-05-11 NOTE — Telephone Encounter (Signed)
Patient calls nurse line regarding Trelegy. She states that she ordered medication through [facility] on Thursday, however, it takes about 7-9 days for shipment.   She only has about three puffs left. I checked in the medication room and pharmacy storage closet and was unable to find any samples.   Called Outpatient pharmacy and medication would cost ~$600.00.  Please advise if there is an alternative that patient could try until she is able to receive shipment from [facility].   [name], RN

## 2023-05-11 NOTE — Telephone Encounter (Signed)
The replacement will need to be 2 inhalers as Markus Daft is not covered  Sent in [employer] and Spiriva.  Please let patient know.  Terisa Starr, MD  Family Medicine Teaching Service

## 2023-05-12 ENCOUNTER — Telehealth: Payer: Self-pay | Admitting: Student

## 2023-05-12 MED ORDER — ALBUTEROL SULFATE HFA 108 (90 BASE) MCG/ACT IN AERS: 2.0000 | INHALATION_SPRAY | Freq: Four times a day (QID) | RESPIRATORY_TRACT | 2 refills | Status: DC | PRN

## 2023-05-12 MED ORDER — TRELEGY ELLIPTA 100-62.5-25 MCG/ACT IN AEPB: 1.0000 | INHALATION_SPRAY | Freq: Every day | RESPIRATORY_TRACT | 0 refills | Status: DC

## 2023-05-12 NOTE — Telephone Encounter (Signed)
FMTS After Hours Line Phone Call Received after hours phone from patient. Called patient and confirmed name and DOB.   Patient reports she ran out of her inhaler medication. She reports taking Trelegy 100-62.5-25 mcg. She would like a refill sent to her pharmacy. Reviewed medications with her and she denies taking Breo Ellipta and Spiriva inhalers. Updated medication list and sent in refill to her pharmacy. She denies having severe SOB or needing to use her rescue inhaler daily.    Instructed patient to call and make an appointment on Monday to follow up with her inhalers and breathing.    Glendale Chard, DO [facility], PGY-2 05/12/23 2:08 PM

## 2023-05-15 ENCOUNTER — Other Ambulatory Visit (HOSPITAL_COMMUNITY): Payer: Self-pay

## 2023-05-15 MED ORDER — TIOTROPIUM BROMIDE MONOHYDRATE 18 MCG IN CAPS
18.0000 ug | ORAL_CAPSULE | Freq: Every day | RESPIRATORY_TRACT | 12 refills | Status: DC
  Filled 2023-05-15: qty 30, 30d supply, fill #0

## 2023-05-15 MED ORDER — FLUTICASONE FUROATE-VILANTEROL 100-25 MCG/ACT IN AEPB
1.0000 | INHALATION_SPRAY | Freq: Every day | RESPIRATORY_TRACT | 2 refills | Status: DC
  Filled 2023-05-15: qty 60, 30d supply, fill #0

## 2023-05-15 NOTE — Addendum Note (Signed)
Addended by: Manson Passey, Khaniyah Bezek on: 05/15/2023 02:53 PM   Modules accepted: Orders

## 2023-05-15 NOTE — Telephone Encounter (Signed)
Called and discussed. She will stop new inhalers once Trelegy comes in stock. Terisa Starr, MD  Family Medicine Teaching Service

## 2023-05-15 NOTE — Telephone Encounter (Signed)
Called patient  twice but no answer, I left a voicemail informing her to call back the office to let us know which pharmacy would she like the two inhalers be sent to. Penni Bombard CMA

## 2023-05-15 NOTE — Telephone Encounter (Signed)
Spoke with patient she would like the two inhalers to be sent to [facility] pharmacy. She would like to know once Trelegy comes in the mail is she suppose to switch back or stick with the new two inhalers. Please advise. Thank you! Penni Bombard CMA

## 2023-05-16 ENCOUNTER — Other Ambulatory Visit (HOSPITAL_COMMUNITY): Payer: Self-pay

## 2023-05-21 ENCOUNTER — Other Ambulatory Visit (HOSPITAL_COMMUNITY): Payer: Self-pay

## 2023-05-21 ENCOUNTER — Ambulatory Visit: Payer: No Typology Code available for payment source | Admitting: Student

## 2023-05-21 ENCOUNTER — Telehealth: Payer: Self-pay | Admitting: Student

## 2023-05-21 NOTE — Telephone Encounter (Signed)
Please send Trelegy to the selected pharmacy, patient has an upcoming visit with you on Wed, is there a medication assistance program we could possibly put her on to be able to afford her meds? Or I can inform patient these topics can be discussed at her appointment? Thank you. Penni Bombard CMA

## 2023-05-21 NOTE — Telephone Encounter (Signed)
Patient came in stating that she went to pick her Trelegy, but that it was going to cost her $300 and she can't afford that. Wants to see if it can be sent to [facility] [address] to see if she can get it cheaper there with her insurance. Also was asking about medication assistance. Was supposed to see Dr Barbaraann Faster today because she is out of medication and is wheezing, had to reschedule for Wed.

## 2023-05-23 ENCOUNTER — Ambulatory Visit (INDEPENDENT_AMBULATORY_CARE_PROVIDER_SITE_OTHER): Payer: Self-pay | Admitting: Student

## 2023-05-23 ENCOUNTER — Encounter: Payer: Self-pay | Admitting: Student

## 2023-05-23 ENCOUNTER — Ambulatory Visit (HOSPITAL_COMMUNITY)
Admission: RE | Admit: 2023-05-23 | Discharge: 2023-05-23 | Disposition: A | Discharge status: Home / Self Care | Payer: Self-pay | Source: Ambulatory Visit | Attending: Family Medicine | Admitting: Family Medicine

## 2023-05-23 ENCOUNTER — Ambulatory Visit: Payer: No Typology Code available for payment source | Admitting: Student

## 2023-05-23 ENCOUNTER — Other Ambulatory Visit (HOSPITAL_COMMUNITY): Payer: Self-pay

## 2023-05-23 VITALS — BP 126/80 | HR 75 | Ht 66.0 in | Wt 171.8 lb

## 2023-05-23 DIAGNOSIS — J439 Emphysema, unspecified: Secondary | ICD-10-CM

## 2023-05-23 DIAGNOSIS — J441 Chronic obstructive pulmonary disease with (acute) exacerbation: Secondary | ICD-10-CM

## 2023-05-23 DIAGNOSIS — R051 Acute cough: Secondary | ICD-10-CM

## 2023-05-23 DIAGNOSIS — R110 Nausea: Secondary | ICD-10-CM

## 2023-05-23 MED ORDER — ALBUTEROL SULFATE HFA 108 (90 BASE) MCG/ACT IN AERS
2.0000 | INHALATION_SPRAY | Freq: Four times a day (QID) | RESPIRATORY_TRACT | 2 refills | Status: DC | PRN
Start: 2023-05-23 — End: 2023-08-08

## 2023-05-23 MED ORDER — PREDNISONE 20 MG PO TABS: 20.0000 mg | ORAL_TABLET | Freq: Two times a day (BID) | ORAL | 0 refills | Status: DC

## 2023-05-23 MED ORDER — ONDANSETRON 4 MG PO TBDP
4.0000 mg | ORAL_TABLET | Freq: Three times a day (TID) | ORAL | 0 refills | Status: DC | PRN
Start: 2023-05-23 — End: 2023-11-16

## 2023-05-23 MED ORDER — DOXYCYCLINE HYCLATE 100 MG PO TABS: 100.0000 mg | ORAL_TABLET | Freq: Two times a day (BID) | ORAL | 0 refills | Status: DC

## 2023-05-23 NOTE — Patient Instructions (Signed)
It was great to see you! Thank you for allowing me to participate in your care!   I recommend that you always bring your medications to each appointment as this makes it easy to ensure we are on the correct medications and helps [place] not miss when refills are needed.  Our plans for today:  - Take one tablet of doxycycline twice a day - Take one tablet of 20 mg prednisone twice a day with meals - Please go across the street to [place] and complete a chest X-ray - I have refilled your medications  We are checking some labs today, I will call you if they are abnormal will send you a MyChart message or a letter if they are normal.  If you do not hear about your labs in the next 2 weeks please let us know.  Take care and seek immediate care sooner if you develop any concerns. Please remember to show up 15 minutes before your scheduled appointment time!  Tiffany Kocher, DO [facility]

## 2023-05-23 NOTE — Progress Notes (Signed)
    SUBJECTIVE:   CHIEF COMPLAINT / HPI:   COPD Asthma  Cough Has 3 month supply of Trelegy from pharmaceutical company. Increased sputum, no worse SOB, but does have increased coughing. No fevers chills, VD. Experiencing some nausea. Albuterol inhaler and duonebs PRN. Not smoking cigarettes. Smokes "pot" for 3 years, on weekends.   PERTINENT  PMH / PSH: COPD, Asthma  OBJECTIVE:   BP 126/80   Pulse 75   Ht 5\' 6"  (1.676 m)   Wt 171 lb 12.8 oz (77.9 kg)   SpO2 97%   BMI 27.73 kg/m    General: NAD, pleasant Cardio: RRR, no MRG. Cap Refill <2s. Respiratory: Expiratory wheezing bilaterally, normal wob on RA Skin: Warm and dry  ASSESSMENT/PLAN:   Cough Worsening cough and increased sputum production. Afebrile and well-appearing in office. Wheezing on exam. Distant hx of pneumonia requiring hospitalization. Primarily suspect COPD v Asthma exacerbation. Will obtain CXR to assess for pneumonia given risk factors. - Continue inhalers, refilled albuterol - Doxycycline 100 mg twice daily 5 days - 40 mg prednisone daily 5 days - CXR, will follow-up on results  Tiffany Kocher, DO [facility]

## 2023-05-30 NOTE — Telephone Encounter (Signed)
Patient contacted for follow-up of Breathing and Use/Access of Trelegy (Umeclidinium/Vilanterol/Fluticasone).  Since last contact patient reports much improved breathing since seeing Dr. Claudean Severance.  She states prednisone and antibiotics have resulted in a recovery to near normal breathing.   At this time, she reports she HAS a supply of Trelegy inhalers (3 month supply) from the manufacturer assistance program.   Medication Plan: -Complete Prednisone  -Restart use of Trelegy on-hand.   Patient does not need any samples at this time.     Total time with patient call and documentation of interaction: 13 minutes.

## 2023-05-30 NOTE — Telephone Encounter (Signed)
Nursing Can you please call patient Lydia Griffin have Trelegy samples--she can have 2. Please fill out form and place at front for her. Please schedule with Dr. Raymondo Band to review inhalers  She has seen Dr. Claudean Severance recently but it does not appear cost issue was addressed.  Terisa Starr, MD  Family Medicine Teaching Service

## 2023-08-08 ENCOUNTER — Other Ambulatory Visit (HOSPITAL_COMMUNITY): Payer: Self-pay

## 2023-08-08 ENCOUNTER — Ambulatory Visit (INDEPENDENT_AMBULATORY_CARE_PROVIDER_SITE_OTHER): Payer: Self-pay | Admitting: Student

## 2023-08-08 VITALS — BP 152/89 | HR 86 | Ht 66.0 in | Wt 172.4 lb

## 2023-08-08 DIAGNOSIS — J441 Chronic obstructive pulmonary disease with (acute) exacerbation: Secondary | ICD-10-CM

## 2023-08-08 DIAGNOSIS — I10 Essential (primary) hypertension: Secondary | ICD-10-CM

## 2023-08-08 DIAGNOSIS — J439 Emphysema, unspecified: Secondary | ICD-10-CM

## 2023-08-08 MED ORDER — AZITHROMYCIN 500 MG PO TABS
500.0000 mg | ORAL_TABLET | Freq: Every day | ORAL | 0 refills | Status: AC
Start: 2023-08-08 — End: 2023-08-11
  Filled 2023-08-08: qty 3, 3d supply, fill #0

## 2023-08-08 MED ORDER — ALBUTEROL SULFATE HFA 108 (90 BASE) MCG/ACT IN AERS
2.0000 | INHALATION_SPRAY | Freq: Four times a day (QID) | RESPIRATORY_TRACT | 2 refills | Status: DC | PRN
  Filled 2023-08-08: qty 6.7, 25d supply, fill #0
  Filled 2023-09-05: qty 6.7, 25d supply, fill #1
  Filled 2023-09-25: qty 6.7, 25d supply, fill #2

## 2023-08-08 MED ORDER — PREDNISONE 20 MG PO TABS
40.0000 mg | ORAL_TABLET | Freq: Every day | ORAL | 0 refills | Status: AC
  Filled 2023-08-08: qty 10, 5d supply, fill #0

## 2023-08-08 NOTE — Progress Notes (Signed)
    SUBJECTIVE:   CHIEF COMPLAINT / HPI:   1 week ago symptoms of "head cold" started of cough and congestion and she felt like it moved to chest Has been using A LOT of albuterol 100+ pumps last week now down to 11 puffs Has used nebulizer Monday which helped some Distance of walking difficult due to dyspnea Breathing better than before but still not great Coughing up thick green sputum No fevers Never smoker but states that the weekend that she started to get the symptoms was after she stayed 2 days at her daughter's house.  She states that her daughter smokes in the house. States hasn't smoked marijuana in a month Needs albuterol inhaler refill No PFTs in chart.  PERTINENT  PMH / PSH: HTN, asthma/COPD, hx of CAP,  OBJECTIVE:   BP (!) 152/89   Pulse 86   Ht 5\' 6"  (1.676 m)   Wt 172 lb 6.4 oz (78.2 kg)   SpO2 97%   BMI 27.83 kg/m   General: NAD, awake, alert, responsive to questions Head: Normocephalic atraumatic, oropharynx clear, moist mucous membranes CV: Regular rate and rhythm no murmurs rubs or gallops Respiratory: Expiratory wheezes present throughout lung fields, chest rises symmetrically,  no increased work of breathing on room air, speaking full sentences although short Neuro: No focal deficits Skin: No rashes or lesions visualized  ASSESSMENT/PLAN:   Assessment & Plan COPD exacerbation (HCC) COPD/asthma exacerbation.  No PFTs noted in chart.  Discussed that likely trigger of tobacco smoke contributing to her frequent exacerbations.  Will treat this as a COPD exacerbation given change in sputum and significant wheezing on examination and her history of hospitalizations. -Prednisone 40 mg daily for 5 days -Azithromycin 500 mg for 3 days -Refilled albuterol -Continue Trelegy -ED/return precautions discussed Hypertension, unspecified type Elevated today likely in the setting of acute illness and albuterol usage. -Recheck in 1 month    Levin Erp,  MD [facility]

## 2023-08-08 NOTE — Patient Instructions (Addendum)
It was great to see you! Thank you for allowing me to participate in your care!   Our plans for today:  - I am sending in prednisone to take daily for 5 days - I am sending in azithromycin to take daily for 3 days - I am refilling your inhalers - If fevers, worsening shortness of breath please return to care  Take care and seek immediate care sooner if you develop any concerns.  Levin Erp, MD

## 2023-08-08 NOTE — Assessment & Plan Note (Addendum)
Elevated today likely in the setting of acute illness and albuterol usage. -Recheck in 1 month

## 2023-09-05 ENCOUNTER — Telehealth: Payer: Self-pay

## 2023-09-05 NOTE — Telephone Encounter (Signed)
Patient calls nurse line reporting flu like symptoms.   She reports cough, congestion, headache, and low grade fever. She reports tmax of 101.  She reports symptoms started ~8 days ago and she has not noticed any improvement.   She denies any wheezing or SOB. She reports her asthma has stayed well controlled.   Patient scheduled for tomorrow in ATC and PCP.

## 2023-09-06 ENCOUNTER — Other Ambulatory Visit: Payer: Self-pay

## 2023-09-06 ENCOUNTER — Other Ambulatory Visit (HOSPITAL_COMMUNITY): Payer: Self-pay

## 2023-09-06 ENCOUNTER — Ambulatory Visit (HOSPITAL_COMMUNITY)
Admission: RE | Admit: 2023-09-06 | Discharge: 2023-09-06 | Disposition: A | Discharge status: Home / Self Care | Payer: No Typology Code available for payment source | Source: Ambulatory Visit | Attending: Family Medicine | Admitting: Family Medicine

## 2023-09-06 ENCOUNTER — Ambulatory Visit (INDEPENDENT_AMBULATORY_CARE_PROVIDER_SITE_OTHER): Payer: Self-pay | Admitting: Student

## 2023-09-06 VITALS — BP 164/101 | HR 83 | Temp 98.4°F | Ht 66.0 in | Wt 171.6 lb

## 2023-09-06 DIAGNOSIS — R079 Chest pain, unspecified: Secondary | ICD-10-CM

## 2023-09-06 DIAGNOSIS — I10 Essential (primary) hypertension: Secondary | ICD-10-CM

## 2023-09-06 DIAGNOSIS — J019 Acute sinusitis, unspecified: Secondary | ICD-10-CM | POA: Insufficient documentation

## 2023-09-06 DIAGNOSIS — H93A1 Pulsatile tinnitus, right ear: Secondary | ICD-10-CM

## 2023-09-06 MED ORDER — AMOXICILLIN-POT CLAVULANATE 875-125 MG PO TABS
1.0000 | ORAL_TABLET | Freq: Two times a day (BID) | ORAL | 0 refills | Status: AC
Start: 2023-09-06 — End: 2023-10-02
  Filled 2023-09-06 – 2023-09-25 (×3): qty 14, 7d supply, fill #0

## 2023-09-06 NOTE — Assessment & Plan Note (Addendum)
Patient blood pressure elevated in office today.  Patient appreciates she does not take her amlodipine.  Patient appreciates her blood pressure at home is usually normal around 130s over 70s.  She checked her blood pressure today and it was 130s over 70s.  Will recommend ambulatory blood pressure monitoring. - Ambulatory blood pressure monitoring with Dr. Raymondo Band

## 2023-09-06 NOTE — Patient Instructions (Signed)
It was great to see you! Thank you for allowing me to participate in your care!  I recommend that you always bring your medications to each appointment as this makes it easy to ensure we are on the correct medications and helps [place] not miss when refills are needed.  Our plans for today:  - Sinus infection  Take Augmentin twice a day, for 7 days  Follow up 1 week if not improved  Take care and seek immediate care sooner if you develop any concerns.   Dr. Bess Kinds, MD [facility]

## 2023-09-06 NOTE — Assessment & Plan Note (Addendum)
Comes in with complaint of pulsatile tinnitus, right ear.  Patient appreciates sound is in line with her heartbeat.  Ear exam benign/normal.  Will recommend follow-up with ENT, for concern of possible AVM, versus tumor, versus aneurysm. - Referral to ENT

## 2023-09-06 NOTE — Assessment & Plan Note (Addendum)
Patient appreciates feeling sick/under the weather for 1.5 to 2 weeks, with cough, congestion, rhinorrhea, and pressure behind face.  Patient denies any fevers or systemic symptoms, reports she had some earlier on in her sickness.  Low concern for pneumonia, given lack of fevers and congestion.  Most concern for sinusitis, given symptoms and timing.  Will treat for sinusitis.  Patient appreciates that she is feeling slightly better since symptoms began, also considered allergies playing a role. - Augmentin twice daily x 7 days - Follow-up 1 week as needed

## 2023-09-06 NOTE — Progress Notes (Signed)
SUBJECTIVE:   CHIEF COMPLAINT / HPI:   Sick Visit -8 days of cough, congestion, fever, HA  -Started a couple weeks ago, had a cold, that went from her head to her chest, stayed in the nasal area with congestion. Is feeling a little winded, but it's not coming from her asthma, as she is not needing her inhaler. Can't breath from nose and thinks this is the cause. Is also appreciationg some sinus pressure and tension HA. Appetite has returned. Rhinorrhea that's a lot. Feels like she might be getting better. Fevers were last week but are better now this week. Has had some Chest pain, associated with coughing.   BP Checked this morning was 136/78  Pulsatile Tinnitus Has appreciated ringing noise in right ear for over a year. Goes with her heart beat.   PERTINENT  PMH / PSH:    OBJECTIVE:  BP (!) 164/101   Pulse 83   Temp 98.4 F (36.9 C) (Oral)   Ht 5\' 6"  (1.676 m)   Wt 171 lb 9.6 oz (77.8 kg)   SpO2 99%   BMI 27.70 kg/m  Physical Exam Constitutional:      General: She is not in acute distress.    Appearance: Normal appearance. She is not ill-appearing.  HENT:     Head:     Comments: No tenderness to palpation over sinuses    Right Ear: Tympanic membrane, ear canal and external ear normal. There is no impacted cerumen.     Left Ear: Tympanic membrane, ear canal and external ear normal. There is no impacted cerumen.     Nose: Congestion present.     Mouth/Throat:     Mouth: Mucous membranes are moist.     Pharynx: Oropharynx is clear. No oropharyngeal exudate or posterior oropharyngeal erythema.  Cardiovascular:     Rate and Rhythm: Normal rate and regular rhythm.     Pulses: Normal pulses.     Heart sounds: Normal heart sounds. No murmur heard.    No friction rub. No gallop.  Pulmonary:     Effort: Pulmonary effort is normal. No respiratory distress.     Breath sounds: Normal breath sounds. No stridor. No wheezing, rhonchi or rales.  Neurological:     Mental Status:  She is alert.      ASSESSMENT/PLAN:   Assessment & Plan Chest pain, unspecified type  Hypertension, unspecified type Patient blood pressure elevated in office today.  Patient appreciates she does not take her amlodipine.  Patient appreciates her blood pressure at home is usually normal around 130s over 70s.  She checked her blood pressure today and it was 130s over 70s.  Will recommend ambulatory blood pressure monitoring. - Ambulatory blood pressure monitoring with Dr. Raymondo Band Pulsatile tinnitus of right ear Comes in with complaint of pulsatile tinnitus, right ear.  Patient appreciates sound is in line with her heartbeat.  Ear exam benign/normal.  Will recommend follow-up with ENT, for concern of possible AVM, versus tumor, versus aneurysm. - Referral to ENT Acute sinusitis, recurrence not specified, unspecified location Patient appreciates feeling sick/under the weather for 1.5 to 2 weeks, with cough, congestion, rhinorrhea, and pressure behind face.  Patient denies any fevers or systemic symptoms, reports she had some earlier on in her sickness.  Low concern for pneumonia, given lack of fevers and congestion.  Most concern for sinusitis, given symptoms and timing.  Will treat for sinusitis.  Patient appreciates that she is feeling slightly better since symptoms began, also considered allergies playing  a role. - Augmentin twice daily x 7 days - Follow-up 1 week as needed No follow-ups on file. Bess Kinds, MD 09/06/2023, 3:38 PM PGY-3, [facility]

## 2023-09-11 ENCOUNTER — Other Ambulatory Visit (HOSPITAL_COMMUNITY): Payer: Self-pay

## 2023-09-18 ENCOUNTER — Other Ambulatory Visit (HOSPITAL_COMMUNITY): Payer: Self-pay

## 2023-09-25 ENCOUNTER — Other Ambulatory Visit (HOSPITAL_COMMUNITY): Payer: Self-pay

## 2023-10-05 ENCOUNTER — Other Ambulatory Visit (HOSPITAL_COMMUNITY): Payer: Self-pay

## 2023-10-15 ENCOUNTER — Other Ambulatory Visit: Payer: Self-pay | Admitting: Student

## 2023-10-15 ENCOUNTER — Other Ambulatory Visit (HOSPITAL_COMMUNITY): Payer: Self-pay

## 2023-10-15 DIAGNOSIS — J439 Emphysema, unspecified: Secondary | ICD-10-CM

## 2023-10-16 ENCOUNTER — Other Ambulatory Visit (HOSPITAL_COMMUNITY): Payer: Self-pay

## 2023-10-16 MED ORDER — ALBUTEROL SULFATE HFA 108 (90 BASE) MCG/ACT IN AERS
2.0000 | INHALATION_SPRAY | Freq: Four times a day (QID) | RESPIRATORY_TRACT | 2 refills | Status: DC | PRN
  Filled 2023-10-16: qty 6.7, 25d supply, fill #0

## 2023-10-22 ENCOUNTER — Encounter: Payer: Self-pay | Admitting: Student

## 2023-10-22 ENCOUNTER — Other Ambulatory Visit (HOSPITAL_COMMUNITY): Payer: Self-pay

## 2023-10-22 ENCOUNTER — Ambulatory Visit (INDEPENDENT_AMBULATORY_CARE_PROVIDER_SITE_OTHER): Payer: Self-pay | Admitting: Student

## 2023-10-22 VITALS — BP 146/107 | HR 94 | Ht 66.0 in | Wt 171.2 lb

## 2023-10-22 DIAGNOSIS — J4541 Moderate persistent asthma with (acute) exacerbation: Secondary | ICD-10-CM

## 2023-10-22 DIAGNOSIS — R21 Rash and other nonspecific skin eruption: Secondary | ICD-10-CM

## 2023-10-22 DIAGNOSIS — J439 Emphysema, unspecified: Secondary | ICD-10-CM

## 2023-10-22 DIAGNOSIS — I10 Essential (primary) hypertension: Secondary | ICD-10-CM

## 2023-10-22 MED ORDER — PREDNISONE 20 MG PO TABS
40.0000 mg | ORAL_TABLET | Freq: Every day | ORAL | 0 refills | Status: AC
  Filled 2023-10-22: qty 10, 5d supply, fill #0

## 2023-10-22 MED ORDER — AMLODIPINE BESYLATE 2.5 MG PO TABS
2.5000 mg | ORAL_TABLET | Freq: Every day | ORAL | 3 refills | Status: DC
  Filled 2023-10-22: qty 30, 30d supply, fill #0

## 2023-10-22 MED ORDER — ALBUTEROL SULFATE HFA 108 (90 BASE) MCG/ACT IN AERS: 2.0000 | INHALATION_SPRAY | Freq: Four times a day (QID) | RESPIRATORY_TRACT | 2 refills | Status: DC | PRN

## 2023-10-22 MED ORDER — CETIRIZINE HCL 10 MG PO TABS
10.0000 mg | ORAL_TABLET | Freq: Every day | ORAL | 11 refills | Status: DC | PRN
  Filled 2023-10-22: qty 30, 30d supply, fill #0

## 2023-10-22 MED ORDER — ALBUTEROL SULFATE (2.5 MG/3ML) 0.083% IN NEBU
2.5000 mg | INHALATION_SOLUTION | Freq: Once | RESPIRATORY_TRACT | Status: AC
  Administered 2023-10-22: 2.5 mg via RESPIRATORY_TRACT

## 2023-10-22 NOTE — Patient Instructions (Signed)
It was great to see you! Thank you for allowing me to participate in your care!  I recommend that you always bring your medications to each appointment as this makes it easy to ensure we are on the correct medications and helps [place] not miss when refills are needed.  Our plans for today:  - Asthma  Prednisone 40 mg daily x 5 days  Continue Trellergy Daily (I will call about refill from manufacturer) Albuterol every 4-6 hours as needed. If relief lasting less than an hour, you need to go be seen in the Emergency Department  - Rash May be coming from the new body soap you have used, don't use anymore. Avoid scented washes and moisturizers. I would recommend Cetaphil as a body wash, and using Aquaphor/Vaseline/Eucerin on your skin as a moisturizer.        *If rash getting worse/not improving by end of week, make a follow up appointment to be seen.   Take care and seek immediate care sooner if you develop any concerns.   Dr. Bess Kinds, MD [facility]

## 2023-10-22 NOTE — Progress Notes (Unsigned)
  SUBJECTIVE:   CHIEF COMPLAINT / HPI:   Asthma Meds:Trelegy to 100-62.5-25, albuterol.   Today: Has been wheezing, SOB, for about a week, since the temperature changed. Aslo appreciating some chest tightness, but slightly better in office. Using albuterol every 4 hours.   HTN Meds:amlodipine 2.5 mg,    Rash Been there 3-4 days, slightly itchy, has started new body wash.  PERTINENT  PMH / PSH:    OBJECTIVE:  There were no vitals taken for this visit. Physical Exam Cardiovascular:     Rate and Rhythm: Normal rate and regular rhythm.     Heart sounds: Normal heart sounds. No murmur heard.    No friction rub. No gallop.  Pulmonary:     Effort: Pulmonary effort is normal. No respiratory distress.     Breath sounds: Wheezing present.  Skin:    Comments: Fine papular rash w/ erythematous base located on arms, chest, back, belly, face      ASSESSMENT/PLAN:   Assessment & Plan Pulmonary emphysema, unspecified emphysema type (HCC)  No follow-ups on file. Bess Kinds, MD 10/22/2023, 7:37 AM PGY-3, [facility] {    This will disappear when note is signed, click to select method of visit    :1}

## 2023-10-23 ENCOUNTER — Other Ambulatory Visit (HOSPITAL_COMMUNITY): Payer: Self-pay

## 2023-10-26 ENCOUNTER — Other Ambulatory Visit (HOSPITAL_COMMUNITY): Payer: Self-pay

## 2023-11-01 ENCOUNTER — Other Ambulatory Visit: Payer: Self-pay | Admitting: Student

## 2023-11-01 ENCOUNTER — Telehealth: Payer: Self-pay | Admitting: Student

## 2023-11-01 ENCOUNTER — Other Ambulatory Visit (HOSPITAL_COMMUNITY): Payer: Self-pay

## 2023-11-01 MED ORDER — TRELEGY ELLIPTA 100-62.5-25 MCG/ACT IN AEPB
1.0000 | INHALATION_SPRAY | Freq: Every day | RESPIRATORY_TRACT | 0 refills | Status: DC
  Filled 2023-11-01 – 2023-12-03 (×2): qty 60, 30d supply, fill #0

## 2023-11-01 NOTE — Telephone Encounter (Signed)
 Called to let patient know that we had gotten a voucher for her Trelegy.  Trelegy should be $0 for her.  Patient to pick up Trelegy at [facility] outpatient pharmacy.  Address [address], [place], [place] [address] Phone: [phone]

## 2023-11-13 ENCOUNTER — Other Ambulatory Visit (HOSPITAL_COMMUNITY): Payer: Self-pay

## 2023-11-16 ENCOUNTER — Other Ambulatory Visit: Payer: Self-pay

## 2023-11-16 ENCOUNTER — Inpatient Hospital Stay (HOSPITAL_COMMUNITY)
Admission: EM | Admit: 2023-11-16 | Discharge: 2023-11-21 | DRG: 377 | Disposition: A | Discharge status: Home / Self Care | Payer: 59 | Attending: Internal Medicine | Admitting: Internal Medicine

## 2023-11-16 ENCOUNTER — Encounter (HOSPITAL_COMMUNITY): Payer: Self-pay | Admitting: Emergency Medicine

## 2023-11-16 ENCOUNTER — Emergency Department (HOSPITAL_COMMUNITY): Payer: 59

## 2023-11-16 DIAGNOSIS — K3189 Other diseases of stomach and duodenum: Secondary | ICD-10-CM | POA: Diagnosis not present

## 2023-11-16 DIAGNOSIS — Z825 Family history of asthma and other chronic lower respiratory diseases: Secondary | ICD-10-CM | POA: Diagnosis not present

## 2023-11-16 DIAGNOSIS — J181 Lobar pneumonia, unspecified organism: Secondary | ICD-10-CM | POA: Diagnosis not present

## 2023-11-16 DIAGNOSIS — K2970 Gastritis, unspecified, without bleeding: Secondary | ICD-10-CM | POA: Diagnosis not present

## 2023-11-16 DIAGNOSIS — F32A Depression, unspecified: Secondary | ICD-10-CM | POA: Diagnosis not present

## 2023-11-16 DIAGNOSIS — J8283 Eosinophilic asthma: Secondary | ICD-10-CM | POA: Diagnosis not present

## 2023-11-16 DIAGNOSIS — J438 Other emphysema: Secondary | ICD-10-CM | POA: Diagnosis not present

## 2023-11-16 DIAGNOSIS — I2510 Atherosclerotic heart disease of native coronary artery without angina pectoris: Secondary | ICD-10-CM | POA: Diagnosis not present

## 2023-11-16 DIAGNOSIS — R059 Cough, unspecified: Secondary | ICD-10-CM | POA: Diagnosis not present

## 2023-11-16 DIAGNOSIS — Z87891 Personal history of nicotine dependence: Secondary | ICD-10-CM

## 2023-11-16 DIAGNOSIS — Z8249 Family history of ischemic heart disease and other diseases of the circulatory system: Secondary | ICD-10-CM | POA: Diagnosis not present

## 2023-11-16 DIAGNOSIS — K259 Gastric ulcer, unspecified as acute or chronic, without hemorrhage or perforation: Secondary | ICD-10-CM | POA: Diagnosis not present

## 2023-11-16 DIAGNOSIS — E871 Hypo-osmolality and hyponatremia: Secondary | ICD-10-CM

## 2023-11-16 DIAGNOSIS — K254 Chronic or unspecified gastric ulcer with hemorrhage: Secondary | ICD-10-CM | POA: Diagnosis not present

## 2023-11-16 DIAGNOSIS — Z79899 Other long term (current) drug therapy: Secondary | ICD-10-CM

## 2023-11-16 DIAGNOSIS — Z7951 Long term (current) use of inhaled steroids: Secondary | ICD-10-CM

## 2023-11-16 DIAGNOSIS — J449 Chronic obstructive pulmonary disease, unspecified: Secondary | ICD-10-CM | POA: Diagnosis present

## 2023-11-16 DIAGNOSIS — R0781 Pleurodynia: Secondary | ICD-10-CM | POA: Diagnosis not present

## 2023-11-16 DIAGNOSIS — R918 Other nonspecific abnormal finding of lung field: Secondary | ICD-10-CM | POA: Diagnosis not present

## 2023-11-16 DIAGNOSIS — J441 Chronic obstructive pulmonary disease with (acute) exacerbation: Secondary | ICD-10-CM | POA: Diagnosis not present

## 2023-11-16 DIAGNOSIS — R9431 Abnormal electrocardiogram [ECG] [EKG]: Secondary | ICD-10-CM | POA: Diagnosis present

## 2023-11-16 DIAGNOSIS — N179 Acute kidney failure, unspecified: Secondary | ICD-10-CM | POA: Diagnosis not present

## 2023-11-16 DIAGNOSIS — R0602 Shortness of breath: Secondary | ICD-10-CM | POA: Diagnosis not present

## 2023-11-16 DIAGNOSIS — A419 Sepsis, unspecified organism: Secondary | ICD-10-CM | POA: Diagnosis present

## 2023-11-16 DIAGNOSIS — R0902 Hypoxemia: Principal | ICD-10-CM | POA: Diagnosis present

## 2023-11-16 DIAGNOSIS — J440 Chronic obstructive pulmonary disease with acute lower respiratory infection: Secondary | ICD-10-CM | POA: Diagnosis present

## 2023-11-16 DIAGNOSIS — B9681 Helicobacter pylori [H. pylori] as the cause of diseases classified elsewhere: Secondary | ICD-10-CM | POA: Diagnosis not present

## 2023-11-16 DIAGNOSIS — Z1152 Encounter for screening for COVID-19: Secondary | ICD-10-CM | POA: Diagnosis not present

## 2023-11-16 DIAGNOSIS — J45901 Unspecified asthma with (acute) exacerbation: Secondary | ICD-10-CM | POA: Diagnosis present

## 2023-11-16 DIAGNOSIS — J189 Pneumonia, unspecified organism: Secondary | ICD-10-CM | POA: Diagnosis not present

## 2023-11-16 DIAGNOSIS — D62 Acute posthemorrhagic anemia: Secondary | ICD-10-CM | POA: Diagnosis present

## 2023-11-16 DIAGNOSIS — F411 Generalized anxiety disorder: Secondary | ICD-10-CM | POA: Diagnosis not present

## 2023-11-16 DIAGNOSIS — J40 Bronchitis, not specified as acute or chronic: Secondary | ICD-10-CM | POA: Diagnosis not present

## 2023-11-16 DIAGNOSIS — K2951 Unspecified chronic gastritis with bleeding: Secondary | ICD-10-CM | POA: Diagnosis not present

## 2023-11-16 DIAGNOSIS — Z8701 Personal history of pneumonia (recurrent): Secondary | ICD-10-CM | POA: Diagnosis not present

## 2023-11-16 DIAGNOSIS — I10 Essential (primary) hypertension: Secondary | ICD-10-CM | POA: Diagnosis not present

## 2023-11-16 DIAGNOSIS — G4700 Insomnia, unspecified: Secondary | ICD-10-CM | POA: Diagnosis present

## 2023-11-16 DIAGNOSIS — J13 Pneumonia due to Streptococcus pneumoniae: Secondary | ICD-10-CM | POA: Diagnosis not present

## 2023-11-16 DIAGNOSIS — K2980 Duodenitis without bleeding: Secondary | ICD-10-CM | POA: Diagnosis not present

## 2023-11-16 DIAGNOSIS — K921 Melena: Secondary | ICD-10-CM | POA: Diagnosis not present

## 2023-11-16 DIAGNOSIS — R7989 Other specified abnormal findings of blood chemistry: Secondary | ICD-10-CM | POA: Diagnosis present

## 2023-11-16 DIAGNOSIS — J439 Emphysema, unspecified: Secondary | ICD-10-CM | POA: Diagnosis not present

## 2023-11-16 HISTORY — DX: Hypo-osmolality and hyponatremia: E87.1

## 2023-11-16 LAB — COMPREHENSIVE METABOLIC PANEL
ALT: 29 U/L (ref 0–44)
AST: 29 U/L (ref 15–41)
Albumin: 3.5 g/dL (ref 3.5–5.0)
Alkaline Phosphatase: 104 U/L (ref 38–126)
Anion gap: 13 (ref 5–15)
BUN: 28 mg/dL — ABNORMAL HIGH (ref 8–23)
CO2: 20 mmol/L — ABNORMAL LOW (ref 22–32)
Calcium: 8.9 mg/dL (ref 8.9–10.3)
Chloride: 99 mmol/L (ref 98–111)
Creatinine, Ser: 1.18 mg/dL — ABNORMAL HIGH (ref 0.44–1.00)
GFR, Estimated: 52 mL/min — ABNORMAL LOW (ref >60)
Glucose, Bld: 94 mg/dL (ref 70–99)
Potassium: 3.7 mmol/L (ref 3.5–5.1)
Sodium: 132 mmol/L — ABNORMAL LOW (ref 135–145)
Total Bilirubin: 1.2 mg/dL (ref 0.0–1.2)
Total Protein: 7.7 g/dL (ref 6.5–8.1)

## 2023-11-16 LAB — D-DIMER, QUANTITATIVE: D-Dimer, Quant: 3.3 ug{FEU}/mL — ABNORMAL HIGH (ref 0.00–0.50)

## 2023-11-16 LAB — CBC WITH DIFFERENTIAL/PLATELET
Abs Immature Granulocytes: 0.96 10*3/uL — ABNORMAL HIGH (ref 0.00–0.07)
Basophils Absolute: 0.1 10*3/uL (ref 0.0–0.1)
Basophils Relative: 0 %
Eosinophils Absolute: 0 10*3/uL (ref 0.0–0.5)
Eosinophils Relative: 0 %
HCT: 40 % (ref 36.0–46.0)
Hemoglobin: 13.4 g/dL (ref 12.0–15.0)
Immature Granulocytes: 5 %
Lymphocytes Relative: 6 %
Lymphs Abs: 1.3 10*3/uL (ref 0.7–4.0)
MCH: 28.5 pg (ref 26.0–34.0)
MCHC: 33.5 g/dL (ref 30.0–36.0)
MCV: 84.9 fL (ref 80.0–100.0)
Monocytes Absolute: 0.5 10*3/uL (ref 0.1–1.0)
Monocytes Relative: 2 %
Neutro Abs: 17.7 10*3/uL — ABNORMAL HIGH (ref 1.7–7.7)
Neutrophils Relative %: 87 %
Platelet Morphology: NORMAL
Platelets: 303 10*3/uL (ref 150–400)
RBC: 4.71 MIL/uL (ref 3.87–5.11)
RDW: 15.2 % (ref 11.5–15.5)
WBC: 20.5 10*3/uL — ABNORMAL HIGH (ref 4.0–10.5)
nRBC: 0 % (ref 0.0–0.2)

## 2023-11-16 LAB — RESP PANEL BY RT-PCR (RSV, FLU A&B, COVID)  RVPGX2
Influenza A by PCR: NEGATIVE
Influenza B by PCR: NEGATIVE
Resp Syncytial Virus by PCR: NEGATIVE
SARS Coronavirus 2 by RT PCR: NEGATIVE

## 2023-11-16 LAB — STREP PNEUMONIAE URINARY ANTIGEN: Strep Pneumo Urinary Antigen: POSITIVE — AB

## 2023-11-16 LAB — PHOSPHORUS: Phosphorus: 2.8 mg/dL (ref 2.5–4.6)

## 2023-11-16 LAB — MAGNESIUM: Magnesium: 1.8 mg/dL (ref 1.7–2.4)

## 2023-11-16 LAB — HIV ANTIBODY (ROUTINE TESTING W REFLEX): HIV Screen 4th Generation wRfx: NONREACTIVE

## 2023-11-16 LAB — LACTIC ACID, PLASMA: Lactic Acid, Venous: 1.9 mmol/L (ref 0.5–1.9)

## 2023-11-16 LAB — TROPONIN I (HIGH SENSITIVITY)
Troponin I (High Sensitivity): 5 ng/L (ref <18)
Troponin I (High Sensitivity): 4 ng/L (ref <18)

## 2023-11-16 LAB — LIPASE, BLOOD: Lipase: 22 U/L (ref 11–51)

## 2023-11-16 LAB — GLUCOSE, CAPILLARY: Glucose-Capillary: 150 mg/dL — ABNORMAL HIGH (ref 70–99)

## 2023-11-16 MED ORDER — SODIUM CHLORIDE (PF) 0.9 % IJ SOLN
INTRAMUSCULAR | Status: AC
  Filled 2023-11-16: qty 50

## 2023-11-16 MED ORDER — CEFTRIAXONE SODIUM 1 G IJ SOLR
1.0000 g | Freq: Once | INTRAMUSCULAR | Status: AC
  Administered 2023-11-16: 1 g via INTRAVENOUS
  Filled 2023-11-16: qty 10

## 2023-11-16 MED ORDER — SODIUM CHLORIDE 0.9 % IV BOLUS
1000.0000 mL | Freq: Once | INTRAVENOUS | Status: AC
  Administered 2023-11-16: 1000 mL via INTRAVENOUS

## 2023-11-16 MED ORDER — PROCHLORPERAZINE EDISYLATE 10 MG/2ML IJ SOLN
10.0000 mg | Freq: Four times a day (QID) | INTRAMUSCULAR | Status: DC | PRN
  Administered 2023-11-16 – 2023-11-18 (×2): 10 mg via INTRAVENOUS
  Filled 2023-11-16 (×2): qty 2

## 2023-11-16 MED ORDER — SODIUM CHLORIDE 0.9 % IV SOLN
500.0000 mg | INTRAVENOUS | Status: DC
  Filled 2023-11-16: qty 5

## 2023-11-16 MED ORDER — ENOXAPARIN SODIUM 40 MG/0.4ML IJ SOSY
40.0000 mg | PREFILLED_SYRINGE | Freq: Every day | INTRAMUSCULAR | Status: DC
  Administered 2023-11-16 – 2023-11-17 (×2): 40 mg via SUBCUTANEOUS
  Filled 2023-11-16 (×2): qty 0.4

## 2023-11-16 MED ORDER — ACETAMINOPHEN 325 MG PO TABS
650.0000 mg | ORAL_TABLET | Freq: Once | ORAL | Status: AC
  Administered 2023-11-16: 650 mg via ORAL
  Filled 2023-11-16: qty 2

## 2023-11-16 MED ORDER — SODIUM CHLORIDE 0.9 % IV SOLN
1.0000 g | INTRAVENOUS | Status: AC
  Administered 2023-11-17 – 2023-11-20 (×4): 1 g via INTRAVENOUS
  Filled 2023-11-16 (×4): qty 10

## 2023-11-16 MED ORDER — METHYLPREDNISOLONE SODIUM SUCC 125 MG IJ SOLR
125.0000 mg | Freq: Once | INTRAMUSCULAR | Status: AC
  Administered 2023-11-16: 125 mg via INTRAVENOUS
  Filled 2023-11-16: qty 2

## 2023-11-16 MED ORDER — KETOROLAC TROMETHAMINE 30 MG/ML IJ SOLN
30.0000 mg | Freq: Four times a day (QID) | INTRAMUSCULAR | Status: AC
  Administered 2023-11-16 (×3): 30 mg via INTRAVENOUS
  Filled 2023-11-16 (×3): qty 1

## 2023-11-16 MED ORDER — FENTANYL CITRATE PF 50 MCG/ML IJ SOSY
50.0000 ug | PREFILLED_SYRINGE | Freq: Once | INTRAMUSCULAR | Status: AC
  Administered 2023-11-16: 50 ug via INTRAVENOUS
  Filled 2023-11-16: qty 1

## 2023-11-16 MED ORDER — ALBUTEROL SULFATE (2.5 MG/3ML) 0.083% IN NEBU
2.5000 mg | INHALATION_SOLUTION | RESPIRATORY_TRACT | Status: DC | PRN
  Administered 2023-11-16: 2.5 mg via RESPIRATORY_TRACT
  Filled 2023-11-16: qty 3

## 2023-11-16 MED ORDER — POTASSIUM CHLORIDE CRYS ER 20 MEQ PO TBCR
40.0000 meq | EXTENDED_RELEASE_TABLET | Freq: Once | ORAL | Status: AC
  Administered 2023-11-16: 40 meq via ORAL
  Filled 2023-11-16: qty 2

## 2023-11-16 MED ORDER — LACTATED RINGERS IV SOLN
150.0000 mL/h | INTRAVENOUS | Status: AC
  Administered 2023-11-16 – 2023-11-17 (×4): 150 mL/h via INTRAVENOUS

## 2023-11-16 MED ORDER — GUAIFENESIN ER 600 MG PO TB12
1200.0000 mg | ORAL_TABLET | Freq: Two times a day (BID) | ORAL | Status: DC
  Administered 2023-11-16 – 2023-11-21 (×11): 1200 mg via ORAL
  Filled 2023-11-16 (×11): qty 2

## 2023-11-16 MED ORDER — SODIUM CHLORIDE 0.9 % IV SOLN
500.0000 mg | Freq: Once | INTRAVENOUS | Status: AC
  Administered 2023-11-16: 500 mg via INTRAVENOUS
  Filled 2023-11-16: qty 5

## 2023-11-16 MED ORDER — IOHEXOL 350 MG/ML SOLN
80.0000 mL | Freq: Once | INTRAVENOUS | Status: AC | PRN
  Administered 2023-11-16: 80 mL via INTRAVENOUS

## 2023-11-16 MED ORDER — MAGNESIUM SULFATE 2 GM/50ML IV SOLN
2.0000 g | Freq: Once | INTRAVENOUS | Status: AC
  Administered 2023-11-16: 2 g via INTRAVENOUS
  Filled 2023-11-16: qty 50

## 2023-11-16 MED ORDER — SODIUM CHLORIDE 0.9 % IV SOLN: 2.0000 g | INTRAVENOUS | Status: DC

## 2023-11-16 MED ORDER — ONDANSETRON HCL 4 MG/2ML IJ SOLN
4.0000 mg | Freq: Once | INTRAMUSCULAR | Status: AC
  Administered 2023-11-16: 4 mg via INTRAVENOUS
  Filled 2023-11-16: qty 2

## 2023-11-16 MED ORDER — SODIUM CHLORIDE 0.9 % IV BOLUS (SEPSIS)
500.0000 mL | Freq: Once | INTRAVENOUS | Status: AC
Start: 2023-11-16 — End: 2023-11-16
  Administered 2023-11-16: 500 mL via INTRAVENOUS
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
## 2023-11-16 NOTE — ED Notes (Signed)
Patient transported to CT

## 2023-11-16 NOTE — ED Notes (Signed)
Called lab to add magnesium to previous light green tube

## 2023-11-16 NOTE — ED Provider Notes (Signed)
[facility] Provider Note   CSN: [id number] Arrival date & time: 11/16/23  2952     History  Chief Complaint  Patient presents with   Shortness of Breath    Lydia Griffin is a 63 y.o. female.  The history is provided by the patient and medical records. No language interpreter was used.  Shortness of Breath Severity:  Moderate Onset quality:  Gradual Duration:  3 days Timing:  Constant Progression:  Waxing and waning Context: URI   Relieved by:  Nothing Worsened by:  Coughing and deep breathing Ineffective treatments:  None tried Associated symptoms: chest pain, cough, fever and sputum production   Associated symptoms: no abdominal pain, no diaphoresis, no headaches, no neck pain, no rash, no vomiting and no wheezing   Risk factors: no hx of PE/DVT        Home Medications Prior to Admission medications   Medication Sig Start Date End Date Taking? Authorizing Provider  albuterol (VENTOLIN HFA) 108 (90 Base) MCG/ACT inhaler Inhale 2 puffs into the lungs every 6 (six) hours as needed for wheezing or shortness of breath. 10/16/23   Westley Chandler, MD  albuterol (VENTOLIN HFA) 108 (90 Base) MCG/ACT inhaler Inhale 2 puffs into the lungs every 6 (six) hours as needed for wheezing or shortness of breath. 10/22/23   Bess Kinds, MD  amLODipine (NORVASC) 2.5 MG tablet Take 1 tablet (2.5 mg total) by mouth daily. 10/22/23   Bess Kinds, MD  baclofen (LIORESAL) 10 MG tablet Take 1/2 tablet (5 mg total) by mouth 3 (three) times daily as needed for muscle spasms. 03/29/23   Lincoln Brigham, MD  cetirizine (ZYRTEC) 10 MG tablet Take 1 tablet (10 mg total) by mouth daily as needed for allergies. 10/22/23   Bess Kinds, MD  Cyanocobalamin (VITAMIN B 12 PO) Take 1,000 mcg by mouth daily.    [provider]  doxycycline (VIBRA-TABS) 100 MG tablet Take 1 tablet (100 mg total) by mouth 2 (two) times daily. 05/23/23   Tiffany Kocher, DO  fluticasone furoate-vilanterol (BREO ELLIPTA) 100-25 MCG/ACT AEPB Inhale 1 puff into the lungs daily. Patient not taking: Reported on 05/30/2023 05/15/23   Westley Chandler, MD  Fluticasone-Umeclidin-Vilant (TRELEGY ELLIPTA) 100-62.5-25 MCG/ACT AEPB Inhale 1 puff into the lungs daily. 11/01/23   Bess Kinds, MD  ipratropium-albuterol (DUONEB) 0.5-2.5 (3) MG/3ML SOLN USE 1 VIAL VIA NEBULIZER EVERY 4 HOURS AS NEEDED Patient taking differently: Take 3 mLs by nebulization every 4 (four) hours as needed (shortness of breath). 02/06/21   Cora Collum, DO  montelukast (SINGULAIR) 10 MG tablet Take 1 tablet (10 mg total) by mouth at bedtime. 03/14/23   Jerre Simon, MD  Multiple Vitamins-Minerals (MULTIVITAMIN WITH MINERALS) tablet Take 1 tablet by mouth daily.    [provider]  ondansetron (ZOFRAN-ODT) 4 MG disintegrating tablet Take 1 tablet (4 mg total) by mouth every 8 (eight) hours as needed for nausea. 05/23/23   Tiffany Kocher, DO  sertraline (ZOLOFT) 100 MG tablet Take 1 tablet (100 mg total) by mouth daily. Patient not taking: Reported on 03/16/2023 07/11/22   Bess Kinds, MD  tiotropium (SPIRIVA HANDIHALER) 18 MCG inhalation capsule Place 1 capsule (18 mcg total) into inhaler and inhale daily. 05/15/23   Westley Chandler, MD  loratadine (CLARITIN) 10 MG tablet Take 1 tablet (10 mg total) by mouth daily. 07/14/19 04/26/20  Shirley, [place], DO      Allergies    Patient has no  known allergies.    Review of Systems   Review of Systems  Constitutional:  Positive for chills, fatigue and fever. Negative for diaphoresis.  HENT:  Positive for congestion.   Respiratory:  Positive for cough, sputum production, chest tightness and shortness of breath. Negative for wheezing and stridor.   Cardiovascular:  Positive for chest pain. Negative for palpitations and leg swelling.  Gastrointestinal:  Positive for diarrhea and nausea. Negative for abdominal pain, constipation and vomiting.   Genitourinary:  Negative for dysuria and flank pain.  Musculoskeletal:  Negative for back pain and neck pain.  Skin:  Negative for rash.  Neurological:  Negative for light-headedness, numbness and headaches.  Psychiatric/Behavioral:  Negative for agitation and confusion.   All other systems reviewed and are negative.   Physical Exam Updated Vital Signs BP 119/87 (BP Location: Left Arm)   Pulse 97   Temp 99.1 F (37.3 C) (Oral)   Resp (!) 23   SpO2 94%  Physical Exam Vitals and nursing note reviewed.  Constitutional:      General: She is not in acute distress.    Appearance: She is well-developed. She is not ill-appearing, toxic-appearing or diaphoretic.  HENT:     Head: Normocephalic and atraumatic.  Eyes:     Conjunctiva/sclera: Conjunctivae normal.  Cardiovascular:     Rate and Rhythm: Regular rhythm. Tachycardia present.     Heart sounds: No murmur heard. Pulmonary:     Effort: Pulmonary effort is normal. Tachypnea present. No respiratory distress.     Breath sounds: Rhonchi present. No wheezing or rales.  Chest:     Chest wall: Tenderness present.  Abdominal:     Palpations: Abdomen is soft.     Tenderness: There is no abdominal tenderness. There is no rebound.  Musculoskeletal:        General: No swelling.     Cervical back: Neck supple.     Right lower leg: No tenderness. No edema.     Left lower leg: No tenderness. No edema.  Skin:    General: Skin is warm and dry.     Capillary Refill: Capillary refill takes less than 2 seconds.     Findings: No erythema.  Neurological:     General: No focal deficit present.     Mental Status: She is alert.  Psychiatric:        Mood and Affect: Mood normal.     ED Results / Procedures / Treatments   Labs (all labs ordered are listed, but only abnormal results are displayed) Labs Reviewed  CBC WITH DIFFERENTIAL/PLATELET - Abnormal; Notable for the following components:      Result Value   WBC 20.5 (*)    Neutro Abs  17.7 (*)    Abs Immature Granulocytes 0.96 (*)    All other components within normal limits  COMPREHENSIVE METABOLIC PANEL - Abnormal; Notable for the following components:   Sodium 132 (*)    CO2 20 (*)    BUN 28 (*)    Creatinine, Ser 1.18 (*)    GFR, Estimated 52 (*)    All other components within normal limits  D-DIMER, QUANTITATIVE - Abnormal; Notable for the following components:   D-Dimer, Quant 3.30 (*)    All other components within normal limits  RESP PANEL BY RT-PCR (RSV, FLU A&B, COVID)  RVPGX2  EXPECTORATED SPUTUM ASSESSMENT W GRAM STAIN, RFLX TO RESP C  LACTIC ACID, PLASMA  LIPASE, BLOOD  HIV ANTIBODY (ROUTINE TESTING W REFLEX)  STREP PNEUMONIAE  URINARY ANTIGEN  MAGNESIUM  PHOSPHORUS  TROPONIN I (HIGH SENSITIVITY)  TROPONIN I (HIGH SENSITIVITY)    EKG EKG Interpretation Date/Time:  Friday November 16 2023 07:03:08 EST Ventricular Rate:  98 PR Interval:  122 QRS Duration:  72 QT Interval:  397 QTC Calculation: 507 R Axis:   47  Text Interpretation: Sinus rhythm Consider left ventricular hypertrophy Prolonged QT interval Confirmed by Meridee Score [id number]) on 11/16/2023 10:20:24 AM  Radiology CT Angio Chest PE W and/or Wo Contrast Result Date: 11/16/2023 CLINICAL DATA:  Left-sided pleuritic chest pain, shortness of breath and fever. Abnormal chest x-ray. EXAM: CT ANGIOGRAPHY CHEST WITH CONTRAST TECHNIQUE: Multidetector CT imaging of the chest was performed using the standard protocol during bolus administration of intravenous contrast. Multiplanar CT image reconstructions and MIPs were obtained to evaluate the vascular anatomy. RADIATION DOSE REDUCTION: This exam was performed according to the departmental dose-optimization program which includes automated exposure control, adjustment of the mA and/or kV according to patient size and/or use of iterative reconstruction technique. CONTRAST:  80mL OMNIPAQUE IOHEXOL 350 MG/ML SOLN COMPARISON:  Chest CT from 2022  FINDINGS: Cardiovascular: The heart is normal in size. No pericardial effusion. The aorta is normal in caliber. No dissection. Scattered atherosclerotic calcifications. Scattered coronary artery calcifications. Suboptimal opacification of the pulmonary arteries but no findings for pulmonary embolism. Mediastinum/Nodes: Enlarged mediastinal and hilar lymph nodes likely reactive given the lung findings. The esophagus is grossly. Lungs/Pleura: Stable underlying chronic lung changes with emphysematous changes and pulmonary scarring. Significant peribronchial thickening, areas of interstitial prominence and multifocal airspace opacities most significant in the left lower lobe with lobar pneumonia. No pleural effusions. No pneumothorax. No discrete pulmonary lesions. Upper Abdomen: No significant upper abdominal findings. Musculoskeletal: No significant bony findings. Incidental midthoracic M angioma. Review of the MIP images confirms the above findings. IMPRESSION: 1. Suboptimal opacification of the pulmonary arteries but no findings for pulmonary embolism. 2. Normal caliber thoracic aorta.  No dissection. 3. Severe bronchitis and multifocal pneumonia. 4. Enlarged mediastinal and hilar lymph nodes likely reactive given the lung findings. Aortic Atherosclerosis (ICD10-I70.0) and Emphysema (ICD10-J43.9). Electronically Signed   By: Rudie Meyer M.D.   On: 11/16/2023 09:55   DG Chest Portable 1 View Result Date: 11/16/2023 CLINICAL DATA:  Pleuritic chest pain with cough. EXAM: PORTABLE CHEST 1 VIEW COMPARISON:  05/23/2023 FINDINGS: Focal masslike opacity noted right lung base without substantial pleural effusion. Diffuse hazy opacity overlying both lower lungs may be related to superimposition of soft tissue. No pulmonary edema or pneumothorax. Cardiopericardial silhouette is at upper limits of normal for size. Telemetry leads overlie the chest. IMPRESSION: Focal masslike opacity right lung base. While this may be  related to pneumonia, underlying mass lesion not excluded. Consider CT chest without contrast to further evaluate. Electronically Signed   By: Kennith Center M.D.   On: 11/16/2023 07:50    Procedures Procedures    CRITICAL CARE Performed by: Canary Brim Rayne Loiseau Total critical care time: 35 minutes Critical care time was exclusive of separately billable procedures and treating other patients. Critical care was necessary to treat or prevent imminent or life-threatening deterioration. Critical care was time spent personally by me on the following activities: development of treatment plan with patient and/or surrogate as well as nursing, discussions with consultants, evaluation of patient's response to treatment, examination of patient, obtaining history from patient or surrogate, ordering and performing treatments and interventions, ordering and review of laboratory studies, ordering and review of radiographic studies, pulse oximetry and re-evaluation of patient's  condition.  Medications Ordered in ED Medications  cefTRIAXone (ROCEPHIN) 1 g in sodium chloride 0.9 % 100 mL IVPB (has no administration in time range)  azithromycin (ZITHROMAX) 500 mg in sodium chloride 0.9 % 250 mL IVPB (has no administration in time range)  acetaminophen (TYLENOL) tablet 650 mg (has no administration in time range)  sodium chloride 0.9 % bolus 1,000 mL (1,000 mLs Intravenous New Bag/Given 11/16/23 0753)  ondansetron (ZOFRAN) injection 4 mg (4 mg Intravenous Given 11/16/23 0755)  fentaNYL (SUBLIMAZE) injection 50 mcg (50 mcg Intravenous Given 11/16/23 0816)  iohexol (OMNIPAQUE) 350 MG/ML injection 80 mL (80 mLs Intravenous Contrast Given 11/16/23 0858)    ED Course/ Medical Decision Making/ A&P                                 Medical Decision Making Amount and/or Complexity of Data Reviewed Labs: ordered. Radiology: ordered.  Risk OTC drugs. Prescription drug management. Decision regarding  hospitalization.    Lydia Griffin is a 63 y.o. female with a past medical history significant for asthma, COPD, previous tubal ligation, and insomnia who presents with fevers, chills, nausea, diarrhea, malaise, fatigue, shortness of breath, cough, and pleuritic chest pain.  Patient reports symptoms have been ongoing for the last few days but she has no sick contacts.  She reports it began with cough and shortness of breath that is progressed.  On my first evaluation oxygen saturation was at 89% on room air.  She does not take oxygen at baseline.  She reports she is having 8 out of 10 pain in her left chest that is sharp and stabbing and is worse with deep breathing.  She denies history of blood clots.  She denies any leg pain or leg swelling and has no recent long travel.  She denies trauma.  She denies any rashes that could be shingles.  She reports no vomiting but she has had the nausea not taken any medicine yet.  She reports diarrhea but no constipation.  No urinary changes reported.  She is not having abdominal pain back or flank pain otherwise.  On exam, lungs had rhonchi bilaterally.  The left chest was tender to palpation.  There was no murmur.  She had good pulses in extremities and legs were nontender and nonedematous.  She was slightly tachycardic and tachypneic and oxygen saturations were slightly low.  She was placed on oxygen.  Oral temperature was nine 9.1, will get a rectal temp.  She has dry mucous membranes and is requesting water.  Clinically I suspect she has a viral illness such as influenza has been going on the community versus pneumonia versus other viral illness.  We will get a chest x-ray initially but due to her tachycardia, tachypnea, and hypoxia, with pleuritic chest pain will get a D-dimer.  We will get screening labs including troponin given left-sided chest pain.  Will get EKG.  Will give her some nausea medicine and fluids.  She will remain on oxygen now.  Anticipate  reassessment after workup to determine physician.  If she remains hypoxic, anticipate admission.       8:39 AM X-ray returned showing likely pneumonia versus mass.  Given the patient's now leukocytosis of 20.5, fever, tachycardia and tachypnea will order antibiotics.  D-dimer is elevated so we will get the CT PE study to both further evaluate the abnormal finding on x-ray as well as rule out PE.  She  will need admission given the hypoxia as well.  10:01 AM CT scan returned without evidence of pulm embolism or mass but did show multifocal pneumonia and severe bronchitis.  Patient is receiving antibiotics and will be admitted for further management of the multifocal pneumonia with new hypoxia.  She was negative for COVID/flu/RSV.  Will order Tylenol for the fever.        Final Clinical Impression(s) / ED Diagnoses Final diagnoses:  Hypoxia  Multifocal pneumonia    Clinical Impression: 1. Hypoxia   2. Multifocal pneumonia     Disposition: Admit  This note was prepared with assistance of Dragon voice recognition software. Occasional wrong-word or sound-a-like substitutions may have occurred due to the inherent limitations of voice recognition software.      Millette Halberstam, Canary Brim, MD 11/16/23 1024

## 2023-11-16 NOTE — ED Triage Notes (Signed)
Pt BIBA from home c/o [facility], took albuterol neb tx at home, no relief. Endorses severe pain on left side, pt says it is difficult to take a deep breath. Has had fevers, diarrhea and nausea x2 days. SpO2 94% RA.  EMS: 22g R hand  2mg  IV zofran 5mg  albuterol neb

## 2023-11-17 DIAGNOSIS — J189 Pneumonia, unspecified organism: Secondary | ICD-10-CM | POA: Diagnosis not present

## 2023-11-17 LAB — CBC WITH DIFFERENTIAL/PLATELET
Abs Immature Granulocytes: 0.07 10*3/uL (ref 0.00–0.07)
Basophils Absolute: 0.1 10*3/uL (ref 0.0–0.1)
Basophils Relative: 0 %
Eosinophils Absolute: 0.2 10*3/uL (ref 0.0–0.5)
Eosinophils Relative: 1 %
HCT: 32.9 % — ABNORMAL LOW (ref 36.0–46.0)
Hemoglobin: 11 g/dL — ABNORMAL LOW (ref 12.0–15.0)
Immature Granulocytes: 0 %
Lymphocytes Relative: 4 %
Lymphs Abs: 0.7 10*3/uL (ref 0.7–4.0)
MCH: 28.4 pg (ref 26.0–34.0)
MCHC: 33.4 g/dL (ref 30.0–36.0)
MCV: 85 fL (ref 80.0–100.0)
Monocytes Absolute: 0.4 10*3/uL (ref 0.1–1.0)
Monocytes Relative: 3 %
Neutro Abs: 16.4 10*3/uL — ABNORMAL HIGH (ref 1.7–7.7)
Neutrophils Relative %: 92 %
Platelets: 263 10*3/uL (ref 150–400)
RBC: 3.87 MIL/uL (ref 3.87–5.11)
RDW: 15.5 % (ref 11.5–15.5)
WBC: 17.8 10*3/uL — ABNORMAL HIGH (ref 4.0–10.5)
nRBC: 0 % (ref 0.0–0.2)

## 2023-11-17 LAB — GLUCOSE, CAPILLARY
Glucose-Capillary: 109 mg/dL — ABNORMAL HIGH (ref 70–99)
Glucose-Capillary: 117 mg/dL — ABNORMAL HIGH (ref 70–99)
Glucose-Capillary: 112 mg/dL — ABNORMAL HIGH (ref 70–99)
Glucose-Capillary: 102 mg/dL — ABNORMAL HIGH (ref 70–99)

## 2023-11-17 LAB — COMPREHENSIVE METABOLIC PANEL
ALT: 8 U/L (ref 0–44)
AST: 26 U/L (ref 15–41)
Albumin: 2.6 g/dL — ABNORMAL LOW (ref 3.5–5.0)
Alkaline Phosphatase: 85 U/L (ref 38–126)
Anion gap: 9 (ref 5–15)
BUN: 22 mg/dL (ref 8–23)
CO2: 20 mmol/L — ABNORMAL LOW (ref 22–32)
Calcium: 8.5 mg/dL — ABNORMAL LOW (ref 8.9–10.3)
Chloride: 106 mmol/L (ref 98–111)
Creatinine, Ser: 0.71 mg/dL (ref 0.44–1.00)
GFR, Estimated: >60 mL/min (ref >60)
Glucose, Bld: 124 mg/dL — ABNORMAL HIGH (ref 70–99)
Potassium: 4.4 mmol/L (ref 3.5–5.1)
Sodium: 135 mmol/L (ref 135–145)
Total Bilirubin: 0.6 mg/dL (ref 0.0–1.2)
Total Protein: 6.4 g/dL — ABNORMAL LOW (ref 6.5–8.1)

## 2023-11-17 LAB — MRSA NEXT GEN BY PCR, NASAL: MRSA by PCR Next Gen: NOT DETECTED

## 2023-11-17 MED ORDER — ALBUTEROL SULFATE (2.5 MG/3ML) 0.083% IN NEBU
2.5000 mg | INHALATION_SOLUTION | Freq: Two times a day (BID) | RESPIRATORY_TRACT | Status: DC
  Administered 2023-11-17 – 2023-11-20 (×8): 2.5 mg via RESPIRATORY_TRACT
  Filled 2023-11-17 (×8): qty 3

## 2023-11-17 MED ORDER — MELATONIN 5 MG PO TABS
5.0000 mg | ORAL_TABLET | Freq: Once | ORAL | Status: AC
  Administered 2023-11-17: 5 mg via ORAL
  Filled 2023-11-17: qty 1

## 2023-11-17 MED ORDER — KETOROLAC TROMETHAMINE 30 MG/ML IJ SOLN
30.0000 mg | Freq: Four times a day (QID) | INTRAMUSCULAR | Status: DC | PRN
  Administered 2023-11-17: 30 mg via INTRAVENOUS
  Filled 2023-11-17: qty 1

## 2023-11-17 MED ORDER — PREDNISONE 5 MG PO TABS
10.0000 mg | ORAL_TABLET | Freq: Every day | ORAL | Status: DC
  Administered 2023-11-17 – 2023-11-18 (×2): 10 mg via ORAL
  Filled 2023-11-17 (×2): qty 2

## 2023-11-17 MED ORDER — AZITHROMYCIN 500 MG PO TABS: 500.0000 mg | ORAL_TABLET | Freq: Every day | ORAL | Status: DC

## 2023-11-17 MED ORDER — UMECLIDINIUM BROMIDE 62.5 MCG/ACT IN AEPB
1.0000 | INHALATION_SPRAY | Freq: Every day | RESPIRATORY_TRACT | Status: DC
  Administered 2023-11-18 – 2023-11-21 (×4): 1 via RESPIRATORY_TRACT
  Filled 2023-11-17: qty 7

## 2023-11-17 MED ORDER — FLUTICASONE FUROATE-VILANTEROL 100-25 MCG/ACT IN AEPB
1.0000 | INHALATION_SPRAY | Freq: Every day | RESPIRATORY_TRACT | Status: DC
  Administered 2023-11-18 – 2023-11-21 (×4): 1 via RESPIRATORY_TRACT
  Filled 2023-11-17: qty 28

## 2023-11-17 NOTE — Plan of Care (Signed)
  Problem: Fluid Volume: Goal: Hemodynamic stability will improve Outcome: Progressing   Problem: Clinical Measurements: Goal: Diagnostic test results will improve Outcome: Progressing Goal: Signs and symptoms of infection will decrease Outcome: Progressing   Problem: Respiratory: Goal: Ability to maintain adequate ventilation will improve Outcome: Progressing   Problem: Education: Goal: Knowledge of General Education information will improve Description: Including pain rating scale, medication(s)/side effects and non-pharmacologic comfort measures Outcome: Progressing   Problem: Health Behavior/Discharge Planning: Goal: Ability to manage health-related needs will improve Outcome: Progressing   Problem: Clinical Measurements: Goal: Ability to maintain clinical measurements within normal limits will improve Outcome: Progressing Goal: Will remain free from infection Outcome: Progressing Goal: Diagnostic test results will improve Outcome: Progressing Goal: Respiratory complications will improve Outcome: Progressing Goal: Cardiovascular complication will be avoided Outcome: Progressing   Problem: Activity: Goal: Risk for activity intolerance will decrease Outcome: Progressing   Problem: Nutrition: Goal: Adequate nutrition will be maintained Outcome: Progressing   Problem: Coping: Goal: Level of anxiety will decrease Outcome: Progressing   Problem: Elimination: Goal: Will not experience complications related to bowel motility Outcome: Progressing Goal: Will not experience complications related to urinary retention Outcome: Progressing   Problem: Pain Managment: Goal: General experience of comfort will improve and/or be controlled Outcome: Progressing   Problem: Safety: Goal: Ability to remain free from injury will improve Outcome: Progressing   Problem: Skin Integrity: Goal: Risk for impaired skin integrity will decrease Outcome: Progressing   Problem:  Education: Goal: Knowledge of disease or condition will improve Outcome: Progressing Goal: Knowledge of the prescribed therapeutic regimen will improve Outcome: Progressing Goal: Individualized Educational Video(s) Outcome: Progressing   Problem: Activity: Goal: Ability to tolerate increased activity will improve Outcome: Progressing Goal: Will verbalize the importance of balancing activity with adequate rest periods Outcome: Progressing   Problem: Respiratory: Goal: Ability to maintain a clear airway will improve Outcome: Progressing Goal: Levels of oxygenation will improve Outcome: Progressing Goal: Ability to maintain adequate ventilation will improve Outcome: Progressing   Problem: Activity: Goal: Ability to tolerate increased activity will improve Outcome: Progressing   Problem: Clinical Measurements: Goal: Ability to maintain a body temperature in the normal range will improve Outcome: Progressing   Problem: Respiratory: Goal: Ability to maintain adequate ventilation will improve Outcome: Progressing Goal: Ability to maintain a clear airway will improve Outcome: Progressing

## 2023-11-17 NOTE — Progress Notes (Signed)
PROGRESS NOTE    KAMAIYA ANTILLA  ZOX:[id number] DOB: [birth year] DOA: 11/16/2023 PCP: Bess Kinds, MD  Chief Complaint  Patient presents with   Shortness of Breath    Brief Narrative:   MADAILEIN LONDO is Stavros Cail 63 y.o. female with medical history significant of insomnia, unconjugated hyperbilirubinemia, anxiety, depression, asthma, acute sinusitis, emphysema/COPD history of pneumonia, history of sepsis who presented to the emergency department with complaints of dyspnea, wheezing, left lower rib cage pleuritic chest pain, fever, nausea and multiple episodes of diarrhea for the past 2 days.   She's been diagnosed with multifocal pneumonia due to strep pneumoniae.   Assessment & Plan:   Principal Problem:   Multifocal pneumonia Active Problems:   Asthma exacerbation   Generalized anxiety disorder   Hypertension   Sepsis (HCC)   Prolonged QT interval   Hyponatremia   Elevated serum creatinine   COPD with acute exacerbation (HCC)  Multifocal Pneumonia Strep Pneumoniae Pneumonia CT chest with no PE, severe bronchitis and multifocal pneumonia She's currently on RA, but with DOE  Urine strep positive, pending MRSA PCR Will narrow abx to ceftriaxone alone Hopefully can consider d/c as early as 1/26  Asthma Exacerbation Wheezing on exam, bronchitis on imaging Steroids, abx as above, scheduled and prn nebs/inhalers  Leukocytosis Due to above  AKI Resolved  Prolonged Qtc Repeat EKG  Hypertension Apparently not taking any antihypertensives Monitor BP  Anxiety Apparently no longer taking zoloft    DVT prophylaxis: lovenox Code Status: full Family Communication: none Disposition:   Status is: Inpatient Remains inpatient appropriate because: continued need for inpatient care   Consultants:  none  Procedures:  none  Antimicrobials:  Anti-infectives (From admission, onward)    Start     Dose/Rate Route Frequency Ordered Stop   11/17/23 1000  azithromycin  (ZITHROMAX) 500 mg in sodium chloride 0.9 % 250 mL IVPB        500 mg 250 mL/hr over 60 Minutes Intravenous Every 24 hours 11/16/23 1013 11/21/23 0959   11/17/23 1000  cefTRIAXone (ROCEPHIN) 1 g in sodium chloride 0.9 % 100 mL IVPB        1 g 200 mL/hr over 30 Minutes Intravenous Every 24 hours 11/16/23 1015 11/21/23 0959   11/17/23 0900  cefTRIAXone (ROCEPHIN) 2 g in sodium chloride 0.9 % 100 mL IVPB  Status:  Discontinued        2 g 200 mL/hr over 30 Minutes Intravenous Every 24 hours 11/16/23 1013 11/16/23 1015   11/16/23 0845  cefTRIAXone (ROCEPHIN) 1 g in sodium chloride 0.9 % 100 mL IVPB        1 g 200 mL/hr over 30 Minutes Intravenous  Once 11/16/23 0840 11/16/23 1038   11/16/23 0845  azithromycin (ZITHROMAX) 500 mg in sodium chloride 0.9 % 250 mL IVPB        500 mg 250 mL/hr over 60 Minutes Intravenous  Once 11/16/23 0840 11/16/23 1153       Subjective: C/o SOB with exertion  Objective: Vitals:   11/16/23 1848 11/16/23 1900 11/16/23 2010 11/17/23 0557  BP:   118/75 137/84  Pulse:   84 87  Resp:   16 18  Temp:   98 F (36.7 C) 98 F (36.7 C)  TempSrc:      SpO2:   97% 98%  Weight:  81.6 kg    Height: 5\' 5"  (1.651 m)       Intake/Output Summary (Last 24 hours) at 11/17/2023 8119 Last data filed at 11/17/2023  1610 Gross per 24 hour  Intake 4158.84 ml  Output --  Net 4158.84 ml   Filed Weights   11/16/23 1900  Weight: 81.6 kg    Examination:  General exam: Appears calm and comfortable  Respiratory system: scattered expiratory wheezing Cardiovascular system: S1 & S2 heard, RRR Gastrointestinal system: Abdomen is nondistended, soft and nontender Central nervous system: Alert and oriented. No focal neurological deficits. Extremities: no LEE   Data Reviewed: I have personally reviewed following labs and imaging studies  CBC: Recent Labs  Lab 11/16/23 0724 11/17/23 0559  WBC 20.5* 17.8*  NEUTROABS 17.7* 16.4*  HGB 13.4 11.0*  HCT 40.0 32.9*  MCV 84.9  85.0  PLT 303 263    Basic Metabolic Panel: Recent Labs  Lab 11/16/23 0724 11/16/23 1003 11/17/23 0559  NA 132*  --  135  K 3.7  --  4.4  CL 99  --  106  CO2 20*  --  20*  GLUCOSE 94  --  124*  BUN 28*  --  22  CREATININE 1.18*  --  0.71  CALCIUM 8.9  --  8.5*  MG  --  1.8  --   PHOS  --  2.8  --     GFR: Estimated Creatinine Clearance: 75.9 mL/min (by C-G formula based on SCr of 0.71 mg/dL).  Liver Function Tests: Recent Labs  Lab 11/16/23 0724 11/17/23 0559  AST 29 26  ALT 29 8  ALKPHOS 104 85  BILITOT 1.2 0.6  PROT 7.7 6.4*  ALBUMIN 3.5 2.6*    CBG: Recent Labs  Lab 11/16/23 2011 11/17/23 0741  GLUCAP 150* 109*     Recent Results (from the past 240 hours)  Resp panel by RT-PCR (RSV, Flu Everado Pillsbury&B, Covid) Anterior Nasal Swab     Status: None   Collection Time: 11/16/23  7:59 AM   Specimen: Anterior Nasal Swab  Result Value Ref Range Status   SARS Coronavirus 2 by RT PCR NEGATIVE NEGATIVE Final    Comment: (NOTE) SARS-CoV-2 target nucleic acids are NOT DETECTED.  The SARS-CoV-2 RNA is generally detectable in upper respiratory specimens during the acute phase of infection. The lowest concentration of SARS-CoV-2 viral copies this assay can detect is 138 copies/mL. Mella Inclan negative result does not preclude SARS-Cov-2 infection and should not be used as the sole basis for treatment or other patient management decisions. Deby Adger negative result may occur with  improper specimen collection/handling, submission of specimen other than nasopharyngeal swab, presence of viral mutation(s) within the areas targeted by this assay, and inadequate number of viral copies(<138 copies/mL). Kester Stimpson negative result must be combined with clinical observations, patient history, and epidemiological information. The expected result is Negative.  Fact Sheet for Patients:  BloggerCourse.com  Fact Sheet for Healthcare Providers:   SeriousBroker.it  This test is no t yet approved or cleared by the [place] FDA and  has been authorized for detection and/or diagnosis of SARS-CoV-2 by FDA under an Emergency Use Authorization (EUA). This EUA will remain  in effect (meaning this test can be used) for the duration of the COVID-19 declaration under Section 564(b)(1) of the Act, 21 U.S.C.section 360bbb-3(b)(1), unless the authorization is terminated  or revoked sooner.       Influenza Jentry Warnell by PCR NEGATIVE NEGATIVE Final   Influenza B by PCR NEGATIVE NEGATIVE Final    Comment: (NOTE) The Xpert Xpress SARS-CoV-2/FLU/RSV plus assay is intended as an aid in the diagnosis of influenza from Nasopharyngeal swab specimens and should not  be used as Annakate Soulier sole basis for treatment. Nasal washings and aspirates are unacceptable for Xpert Xpress SARS-CoV-2/FLU/RSV testing.  Fact Sheet for Patients: BloggerCourse.com  Fact Sheet for Healthcare Providers: SeriousBroker.it  This test is not yet approved or cleared by the [place] FDA and has been authorized for detection and/or diagnosis of SARS-CoV-2 by FDA under an Emergency Use Authorization (EUA). This EUA will remain in effect (meaning this test can be used) for the duration of the COVID-19 declaration under Section 564(b)(1) of the Act, 21 U.S.C. section 360bbb-3(b)(1), unless the authorization is terminated or revoked.     Resp Syncytial Virus by PCR NEGATIVE NEGATIVE Final    Comment: (NOTE) Fact Sheet for Patients: BloggerCourse.com  Fact Sheet for Healthcare Providers: SeriousBroker.it  This test is not yet approved or cleared by the [place] FDA and has been authorized for detection and/or diagnosis of SARS-CoV-2 by FDA under an Emergency Use Authorization (EUA). This EUA will remain in effect (meaning this test can be used) for  the duration of the COVID-19 declaration under Section 564(b)(1) of the Act, 21 U.S.C. section 360bbb-3(b)(1), unless the authorization is terminated or revoked.  Performed at [facility], [address] [place], [place] [address]          Radiology Studies: CT Angio Chest PE W and/or Wo Contrast Result Date: 11/16/2023 CLINICAL DATA:  Left-sided pleuritic chest pain, shortness of breath and fever. Abnormal chest x-ray. EXAM: CT ANGIOGRAPHY CHEST WITH CONTRAST TECHNIQUE: Multidetector CT imaging of the chest was performed using the standard protocol during bolus administration of intravenous contrast. Multiplanar CT image reconstructions and MIPs were obtained to evaluate the vascular anatomy. RADIATION DOSE REDUCTION: This exam was performed according to the departmental dose-optimization program which includes automated exposure control, adjustment of the mA and/or kV according to patient size and/or use of iterative reconstruction technique. CONTRAST:  80mL OMNIPAQUE IOHEXOL 350 MG/ML SOLN COMPARISON:  Chest CT from 2022 FINDINGS: Cardiovascular: The heart is normal in size. No pericardial effusion. The aorta is normal in caliber. No dissection. Scattered atherosclerotic calcifications. Scattered coronary artery calcifications. Suboptimal opacification of the pulmonary arteries but no findings for pulmonary embolism. Mediastinum/Nodes: Enlarged mediastinal and hilar lymph nodes likely reactive given the lung findings. The esophagus is grossly. Lungs/Pleura: Stable underlying chronic lung changes with emphysematous changes and pulmonary scarring. Significant peribronchial thickening, areas of interstitial prominence and multifocal airspace opacities most significant in the left lower lobe with lobar pneumonia. No pleural effusions. No pneumothorax. No discrete pulmonary lesions. Upper Abdomen: No significant upper abdominal findings. Musculoskeletal: No significant bony  findings. Incidental midthoracic M angioma. Review of the MIP images confirms the above findings. IMPRESSION: 1. Suboptimal opacification of the pulmonary arteries but no findings for pulmonary embolism. 2. Normal caliber thoracic aorta.  No dissection. 3. Severe bronchitis and multifocal pneumonia. 4. Enlarged mediastinal and hilar lymph nodes likely reactive given the lung findings. Aortic Atherosclerosis (ICD10-I70.0) and Emphysema (ICD10-J43.9). Electronically Signed   By: Rudie Meyer M.D.   On: 11/16/2023 09:55   DG Chest Portable 1 View Result Date: 11/16/2023 CLINICAL DATA:  Pleuritic chest pain with cough. EXAM: PORTABLE CHEST 1 VIEW COMPARISON:  05/23/2023 FINDINGS: Focal masslike opacity noted right lung base without substantial pleural effusion. Diffuse hazy opacity overlying both lower lungs may be related to superimposition of soft tissue. No pulmonary edema or pneumothorax. Cardiopericardial silhouette is at upper limits of normal for size. Telemetry leads overlie the chest. IMPRESSION: Focal masslike opacity right lung base. While  this may be related to pneumonia, underlying mass lesion not excluded. Consider CT chest without contrast to further evaluate. Electronically Signed   By: Kennith Center M.D.   On: 11/16/2023 07:50        Scheduled Meds:  albuterol  2.5 mg Nebulization BID   enoxaparin (LOVENOX) injection  40 mg Subcutaneous Daily   fluticasone furoate-vilanterol  1 puff Inhalation Daily   And   umeclidinium bromide  1 puff Inhalation Daily   guaiFENesin  1,200 mg Oral BID   predniSONE  10 mg Oral Q breakfast   Continuous Infusions:  azithromycin     cefTRIAXone (ROCEPHIN)  IV     lactated ringers 150 mL/hr (11/17/23 0346)     LOS: 1 day    Time spent: over 30 min    Lacretia Nicks, MD [facility]   To contact the attending provider between 7A-7P or the covering provider during after hours 7P-7A, please log into the web site [email or web address] and  access using universal [place] password for that web site. If you do not have the password, please call the hospital operator.  11/17/2023, 9:26 AM

## 2023-11-17 NOTE — Progress Notes (Addendum)
Ambulated patient in the hall - 500 feet on RA. SPO2 remained above 95 %. Patient tolerated well

## 2023-11-17 NOTE — Plan of Care (Signed)
  Problem: Fluid Volume: Goal: Hemodynamic stability will improve Outcome: Progressing   Problem: Clinical Measurements: Goal: Diagnostic test results will improve Outcome: Progressing Goal: Signs and symptoms of infection will decrease Outcome: Progressing   Problem: Respiratory: Goal: Ability to maintain adequate ventilation will improve Outcome: Progressing   Problem: Education: Goal: Knowledge of General Education information will improve Description: Including pain rating scale, medication(s)/side effects and non-pharmacologic comfort measures Outcome: Progressing   Problem: Health Behavior/Discharge Planning: Goal: Ability to manage health-related needs will improve Outcome: Progressing   Problem: Clinical Measurements: Goal: Ability to maintain clinical measurements within normal limits will improve Outcome: Progressing Goal: Will remain free from infection Outcome: Progressing Goal: Diagnostic test results will improve Outcome: Progressing Goal: Respiratory complications will improve Outcome: Progressing Goal: Cardiovascular complication will be avoided Outcome: Progressing   Problem: Activity: Goal: Risk for activity intolerance will decrease Outcome: Progressing   Problem: Nutrition: Goal: Adequate nutrition will be maintained Outcome: Progressing   Problem: Coping: Goal: Level of anxiety will decrease Outcome: Progressing   Problem: Elimination: Goal: Will not experience complications related to bowel motility Outcome: Progressing Goal: Will not experience complications related to urinary retention Outcome: Progressing   Problem: Pain Managment: Goal: General experience of comfort will improve and/or be controlled Outcome: Progressing   Problem: Safety: Goal: Ability to remain free from injury will improve Outcome: Progressing   Problem: Skin Integrity: Goal: Risk for impaired skin integrity will decrease Outcome: Progressing   Problem:  Education: Goal: Knowledge of disease or condition will improve Outcome: Progressing Goal: Knowledge of the prescribed therapeutic regimen will improve Outcome: Progressing Goal: Individualized Educational Video(s) Outcome: Progressing   Problem: Activity: Goal: Ability to tolerate increased activity will improve Outcome: Progressing Goal: Will verbalize the importance of balancing activity with adequate rest periods Outcome: Progressing   Problem: Respiratory: Goal: Ability to maintain a clear airway will improve Outcome: Progressing Goal: Levels of oxygenation will improve Outcome: Progressing Goal: Ability to maintain adequate ventilation will improve Outcome: Progressing   Problem: Activity: Goal: Ability to tolerate increased activity will improve Outcome: Progressing   Problem: Clinical Measurements: Goal: Ability to maintain a body temperature in the normal range will improve Outcome: Progressing   Problem: Respiratory: Goal: Ability to maintain adequate ventilation will improve Outcome: Progressing Goal: Ability to maintain a clear airway will improve Outcome: Progressing  Pt is excited to go home tomorrow

## 2023-11-18 DIAGNOSIS — J189 Pneumonia, unspecified organism: Secondary | ICD-10-CM | POA: Diagnosis not present

## 2023-11-18 LAB — PHOSPHORUS: Phosphorus: 1.8 mg/dL — ABNORMAL LOW (ref 2.5–4.6)

## 2023-11-18 LAB — HEMOGLOBIN AND HEMATOCRIT, BLOOD
HCT: 19 % — ABNORMAL LOW (ref 36.0–46.0)
HCT: 21.5 % — ABNORMAL LOW (ref 36.0–46.0)
HCT: 22.8 % — ABNORMAL LOW (ref 36.0–46.0)
Hemoglobin: 6.1 g/dL — CL (ref 12.0–15.0)
Hemoglobin: 7.3 g/dL — ABNORMAL LOW (ref 12.0–15.0)
Hemoglobin: 7.8 g/dL — ABNORMAL LOW (ref 12.0–15.0)

## 2023-11-18 LAB — GLUCOSE, CAPILLARY: Glucose-Capillary: 100 mg/dL — ABNORMAL HIGH (ref 70–99)

## 2023-11-18 LAB — CBC WITH DIFFERENTIAL/PLATELET
Abs Immature Granulocytes: 0.29 10*3/uL — ABNORMAL HIGH (ref 0.00–0.07)
Basophils Absolute: 0 10*3/uL (ref 0.0–0.1)
Basophils Relative: 0 %
Eosinophils Absolute: 0 10*3/uL (ref 0.0–0.5)
Eosinophils Relative: 0 %
HCT: 19.6 % — ABNORMAL LOW (ref 36.0–46.0)
Hemoglobin: 6.2 g/dL — CL (ref 12.0–15.0)
Immature Granulocytes: 2 %
Lymphocytes Relative: 17 %
Lymphs Abs: 2.3 10*3/uL (ref 0.7–4.0)
MCH: 28.3 pg (ref 26.0–34.0)
MCHC: 31.6 g/dL (ref 30.0–36.0)
MCV: 89.5 fL (ref 80.0–100.0)
Monocytes Absolute: 1.1 10*3/uL — ABNORMAL HIGH (ref 0.1–1.0)
Monocytes Relative: 8 %
Neutro Abs: 9.9 10*3/uL — ABNORMAL HIGH (ref 1.7–7.7)
Neutrophils Relative %: 73 %
Platelets: 215 10*3/uL (ref 150–400)
RBC: 2.19 MIL/uL — ABNORMAL LOW (ref 3.87–5.11)
RDW: 15.9 % — ABNORMAL HIGH (ref 11.5–15.5)
WBC: 13.7 10*3/uL — ABNORMAL HIGH (ref 4.0–10.5)
nRBC: 0.1 % (ref 0.0–0.2)

## 2023-11-18 LAB — COMPREHENSIVE METABOLIC PANEL
ALT: 18 U/L (ref 0–44)
AST: 13 U/L — ABNORMAL LOW (ref 15–41)
Albumin: 2 g/dL — ABNORMAL LOW (ref 3.5–5.0)
Alkaline Phosphatase: 54 U/L (ref 38–126)
Anion gap: 7 (ref 5–15)
BUN: 76 mg/dL — ABNORMAL HIGH (ref 8–23)
CO2: 21 mmol/L — ABNORMAL LOW (ref 22–32)
Calcium: 7.9 mg/dL — ABNORMAL LOW (ref 8.9–10.3)
Chloride: 106 mmol/L (ref 98–111)
Creatinine, Ser: 0.8 mg/dL (ref 0.44–1.00)
GFR, Estimated: >60 mL/min (ref >60)
Glucose, Bld: 101 mg/dL — ABNORMAL HIGH (ref 70–99)
Potassium: 3.8 mmol/L (ref 3.5–5.1)
Sodium: 134 mmol/L — ABNORMAL LOW (ref 135–145)
Total Bilirubin: 0.4 mg/dL (ref 0.0–1.2)
Total Protein: 4.8 g/dL — ABNORMAL LOW (ref 6.5–8.1)

## 2023-11-18 LAB — PREPARE RBC (CROSSMATCH)

## 2023-11-18 LAB — ABO/RH: ABO/RH(D): O POS

## 2023-11-18 LAB — OCCULT BLOOD X 1 CARD TO LAB, STOOL: Fecal Occult Bld: NEGATIVE

## 2023-11-18 LAB — MAGNESIUM: Magnesium: 2.5 mg/dL — ABNORMAL HIGH (ref 1.7–2.4)

## 2023-11-18 LAB — LIPASE, BLOOD: Lipase: 24 U/L (ref 11–51)

## 2023-11-18 MED ORDER — PANTOPRAZOLE SODIUM 40 MG IV SOLR
40.0000 mg | Freq: Two times a day (BID) | INTRAVENOUS | Status: DC
  Filled 2023-11-18: qty 10

## 2023-11-18 MED ORDER — K PHOS MONO-SOD PHOS DI & MONO 155-852-130 MG PO TABS
500.0000 mg | ORAL_TABLET | Freq: Four times a day (QID) | ORAL | Status: AC
  Administered 2023-11-18 (×4): 500 mg via ORAL
  Filled 2023-11-18 (×4): qty 2

## 2023-11-18 MED ORDER — TRAZODONE HCL 50 MG PO TABS
25.0000 mg | ORAL_TABLET | Freq: Once | ORAL | Status: AC
  Administered 2023-11-18: 25 mg via ORAL
  Filled 2023-11-18: qty 1

## 2023-11-18 MED ORDER — SODIUM CHLORIDE 0.9% IV SOLUTION: Freq: Once | INTRAVENOUS | Status: AC

## 2023-11-18 MED ORDER — PANTOPRAZOLE SODIUM 40 MG IV SOLR
40.0000 mg | INTRAVENOUS | Status: AC
  Administered 2023-11-18 (×2): 40 mg via INTRAVENOUS
  Filled 2023-11-18: qty 10

## 2023-11-18 MED ORDER — PANTOPRAZOLE SODIUM 40 MG IV SOLR
40.0000 mg | Freq: Two times a day (BID) | INTRAVENOUS | Status: DC
Start: 2023-11-18 — End: 2023-11-21
  Administered 2023-11-18 – 2023-11-20 (×5): 40 mg via INTRAVENOUS
  Filled 2023-11-18 (×5): qty 10

## 2023-11-18 NOTE — Progress Notes (Signed)
Patient hemoglobin dropped to 6.2 from 11.0. Verified with second lab collect.  Notified Dr.Powell who came to bedside and assessed patient. Hemoccult stool card sent. NPO orders place. Blood consent signed and placed in chart. Plan to to transfuse two units of PRBCs. GI consulted
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
## 2023-11-18 NOTE — Progress Notes (Addendum)
PROGRESS NOTE    FRANCESA EUGENIO  YNW:[id number] DOB: 1961-02-16 DOA: 11/16/2023 PCP: Bess Kinds, MD  Chief Complaint  Patient presents with   Shortness of Breath    Brief Narrative:   Lydia Griffin is Lydia Griffin 63 y.o. female with medical history significant of insomnia, unconjugated hyperbilirubinemia, anxiety, depression, asthma, acute sinusitis, emphysema/COPD history of pneumonia, history of sepsis who presented to the emergency department with complaints of dyspnea, wheezing, left lower rib cage pleuritic chest pain, fever, nausea and multiple episodes of diarrhea for the past 2 days.   She's been diagnosed with multifocal pneumonia due to strep pneumoniae.   Assessment & Plan:   Principal Problem:   Multifocal pneumonia Active Problems:   Asthma exacerbation   Generalized anxiety disorder   Hypertension   Sepsis (HCC)   Prolonged QT interval   Hyponatremia   Elevated serum creatinine   COPD with acute exacerbation (HCC)  Acute Anemia Concern for upper GI bleed given elevated BUN Rectal exam with scant mucus/stool, no melena/blood noted Start protonix IV  Will consult GI  Discussed risks/benefits transfusion, she's agreeable to blood  Multifocal Pneumonia Strep Pneumoniae Pneumonia CT chest with no PE, severe bronchitis and multifocal pneumonia She's currently on RA - improved from pneumonia standpoint, had planned for possible d/c, but will remain admitted with above anemia Urine strep positive, pending MRSA PCR ceftriaxone alone  Asthma Exacerbation Wheezing on exam, bronchitis on imaging Hold steroids given concern for GI bleed abvoe  Leukocytosis Due to above, improving  AKI Resolved  Prolonged Qtc Repeat EKG  Hypertension Apparently not taking any antihypertensives Monitor BP  Anxiety Apparently no longer taking zoloft    DVT prophylaxis: lovenox Code Status: full Family Communication: none Disposition:   Status is: Inpatient Remains  inpatient appropriate because: continued need for inpatient care   Consultants:  none  Procedures:  none  Antimicrobials:  Anti-infectives (From admission, onward)    Start     Dose/Rate Route Frequency Ordered Stop   11/17/23 1015  azithromycin (ZITHROMAX) tablet 500 mg  Status:  Discontinued        500 mg Oral Daily 11/17/23 0928 11/17/23 0929   11/17/23 1000  azithromycin (ZITHROMAX) 500 mg in sodium chloride 0.9 % 250 mL IVPB  Status:  Discontinued        500 mg 250 mL/hr over 60 Minutes Intravenous Every 24 hours 11/16/23 1013 11/17/23 0928   11/17/23 1000  cefTRIAXone (ROCEPHIN) 1 g in sodium chloride 0.9 % 100 mL IVPB        1 g 200 mL/hr over 30 Minutes Intravenous Every 24 hours 11/16/23 1015 11/21/23 0959   11/17/23 0900  cefTRIAXone (ROCEPHIN) 2 g in sodium chloride 0.9 % 100 mL IVPB  Status:  Discontinued        2 g 200 mL/hr over 30 Minutes Intravenous Every 24 hours 11/16/23 1013 11/16/23 1015   11/16/23 0845  cefTRIAXone (ROCEPHIN) 1 g in sodium chloride 0.9 % 100 mL IVPB        1 g 200 mL/hr over 30 Minutes Intravenous  Once 11/16/23 0840 11/16/23 1038   11/16/23 0845  azithromycin (ZITHROMAX) 500 mg in sodium chloride 0.9 % 250 mL IVPB        500 mg 250 mL/hr over 60 Minutes Intravenous  Once 11/16/23 0840 11/16/23 1153       Subjective: C/o fatigue, mild LH Epigastric discomfort  Objective: Vitals:   11/17/23 2124 11/18/23 0513 11/18/23 0748 11/18/23 0756  BP: 136/77  119/73  (!) 102/49  Pulse: 85 80  91  Resp:      Temp: 98.3 F (36.8 C) 98 F (36.7 C)  97.8 F (36.6 C)  TempSrc: Oral Oral  Oral  SpO2: 97% 99% 93% 98%  Weight:      Height:        Intake/Output Summary (Last 24 hours) at 11/18/2023 0831 Last data filed at 11/17/2023 1551 Gross per 24 hour  Intake 100 ml  Output --  Net 100 ml   Filed Weights   11/16/23 1900  Weight: 81.6 kg    Examination:  General: No acute distress. Cardiovascular: RRR Lungs: unlabored, bibasilar  crackles (L>R), less wheezing noted Abdomen: mild epigastric discomfort Rectal exam with external hemorrhoids, scant brown mucus/stool - no melena or blood Neurological: Alert and oriented 3. Moves all extremities 4 with equal strength. Cranial nerves II through XII grossly intact. Extremities: No clubbing or cyanosis. No edema.  Data Reviewed: I have personally reviewed following labs and imaging studies  CBC: Recent Labs  Lab 11/16/23 0724 11/17/23 0559 11/18/23 0742  WBC 20.5* 17.8* 13.7*  NEUTROABS 17.7* 16.4* 9.9*  HGB 13.4 11.0* 6.2*  HCT 40.0 32.9* 19.6*  MCV 84.9 85.0 89.5  PLT 303 263 215    Basic Metabolic Panel: Recent Labs  Lab 11/16/23 0724 11/16/23 1003 11/17/23 0559 11/18/23 0533  NA 132*  --  135 134*  K 3.7  --  4.4 3.8  CL 99  --  106 106  CO2 20*  --  20* 21*  GLUCOSE 94  --  124* 101*  BUN 28*  --  22 76*  CREATININE 1.18*  --  0.71 0.80  CALCIUM 8.9  --  8.5* 7.9*  MG  --  1.8  --  2.5*  PHOS  --  2.8  --  1.8*    GFR: Estimated Creatinine Clearance: 75.9 mL/min (by C-G formula based on SCr of 0.8 mg/dL).  Liver Function Tests: Recent Labs  Lab 11/16/23 0724 11/17/23 0559 11/18/23 0533  AST 29 26 13*  ALT 29 8 18   ALKPHOS 104 85 54  BILITOT 1.2 0.6 0.4  PROT 7.7 6.4* 4.8*  ALBUMIN 3.5 2.6* 2.0*    CBG: Recent Labs  Lab 11/16/23 2011 11/17/23 0741 11/17/23 1201 11/17/23 1700 11/17/23 2125  GLUCAP 150* 109* 117* 112* 102*     Recent Results (from the past 240 hours)  Resp panel by RT-PCR (RSV, Flu Jennife Zaucha&B, Covid) Anterior Nasal Swab     Status: None   Collection Time: 11/16/23  7:59 AM   Specimen: Anterior Nasal Swab  Result Value Ref Range Status   SARS Coronavirus 2 by RT PCR NEGATIVE NEGATIVE Final    Comment: (NOTE) SARS-CoV-2 target nucleic acids are NOT DETECTED.  The SARS-CoV-2 RNA is generally detectable in upper respiratory specimens during the acute phase of infection. The lowest concentration of SARS-CoV-2  viral copies this assay can detect is 138 copies/mL. Malvern Kadlec negative result does not preclude SARS-Cov-2 infection and should not be used as the sole basis for treatment or other patient management decisions. Daron Stutz negative result may occur with  improper specimen collection/handling, submission of specimen other than nasopharyngeal swab, presence of viral mutation(s) within the areas targeted by this assay, and inadequate number of viral copies(<138 copies/mL). Biannca Scantlin negative result must be combined with clinical observations, patient history, and epidemiological information. The expected result is Negative.  Fact Sheet for Patients:  BloggerCourse.com  Fact Sheet for Healthcare Providers:  SeriousBroker.it  This test is no t yet approved or cleared by the [place] and  has been authorized for detection and/or diagnosis of SARS-CoV-2 by FDA under an Emergency Use Authorization (EUA). This EUA will remain  in effect (meaning this test can be used) for the duration of the COVID-19 declaration under Section 564(b)(1) of the Act, 21 U.S.C.section 360bbb-3(b)(1), unless the authorization is terminated  or revoked sooner.       Influenza Larron Armor by PCR NEGATIVE NEGATIVE Final   Influenza B by PCR NEGATIVE NEGATIVE Final    Comment: (NOTE) The Xpert Xpress SARS-CoV-2/FLU/RSV plus assay is intended as an aid in the diagnosis of influenza from Nasopharyngeal swab specimens and should not be used as Lun Muro sole basis for treatment. Nasal washings and aspirates are unacceptable for Xpert Xpress SARS-CoV-2/FLU/RSV testing.  Fact Sheet for Patients: BloggerCourse.com  Fact Sheet for Healthcare Providers: SeriousBroker.it  This test is not yet approved or cleared by the [place] FDA and has been authorized for detection and/or diagnosis of SARS-CoV-2 by FDA under an Emergency Use Authorization  (EUA). This EUA will remain in effect (meaning this test can be used) for the duration of the COVID-19 declaration under Section 564(b)(1) of the Act, 21 U.S.C. section 360bbb-3(b)(1), unless the authorization is terminated or revoked.     Resp Syncytial Virus by PCR NEGATIVE NEGATIVE Final    Comment: (NOTE) Fact Sheet for Patients: BloggerCourse.com  Fact Sheet for Healthcare Providers: SeriousBroker.it  This test is not yet approved or cleared by the [place] FDA and has been authorized for detection and/or diagnosis of SARS-CoV-2 by FDA under an Emergency Use Authorization (EUA). This EUA will remain in effect (meaning this test can be used) for the duration of the COVID-19 declaration under Section 564(b)(1) of the Act, 21 U.S.C. section 360bbb-3(b)(1), unless the authorization is terminated or revoked.  Performed at [facility], [address], [place], [place] [address]   MRSA Next Gen by PCR, Nasal     Status: None   Collection Time: 11/17/23  7:52 AM   Specimen: Nasal Mucosa; Nasal Swab  Result Value Ref Range Status   MRSA by PCR Next Gen NOT DETECTED NOT DETECTED Final    Comment: (NOTE) The GeneXpert MRSA Assay (FDA approved for NASAL specimens only), is one component of Rogerio Boutelle comprehensive MRSA colonization surveillance program. It is not intended to diagnose MRSA infection nor to guide or monitor treatment for MRSA infections. Test performance is not FDA approved in patients less than 51 years old. Performed at [facility], [address] [place], [place] [address]          Radiology Studies: CT Angio Chest PE W and/or Wo Contrast Result Date: 11/16/2023 CLINICAL DATA:  Left-sided pleuritic chest pain, shortness of breath and fever. Abnormal chest x-ray. EXAM: CT ANGIOGRAPHY CHEST WITH CONTRAST TECHNIQUE: Multidetector CT imaging of the chest was performed using the  standard protocol during bolus administration of intravenous contrast. Multiplanar CT image reconstructions and MIPs were obtained to evaluate the vascular anatomy. RADIATION DOSE REDUCTION: This exam was performed according to the departmental dose-optimization program which includes automated exposure control, adjustment of the mA and/or kV according to patient size and/or use of iterative reconstruction technique. CONTRAST:  80mL OMNIPAQUE IOHEXOL 350 MG/ML SOLN COMPARISON:  Chest CT from 2022 FINDINGS: Cardiovascular: The heart is normal in size. No pericardial effusion. The aorta is normal in caliber. No dissection. Scattered atherosclerotic calcifications. Scattered coronary artery calcifications.  Suboptimal opacification of the pulmonary arteries but no findings for pulmonary embolism. Mediastinum/Nodes: Enlarged mediastinal and hilar lymph nodes likely reactive given the lung findings. The esophagus is grossly. Lungs/Pleura: Stable underlying chronic lung changes with emphysematous changes and pulmonary scarring. Significant peribronchial thickening, areas of interstitial prominence and multifocal airspace opacities most significant in the left lower lobe with lobar pneumonia. No pleural effusions. No pneumothorax. No discrete pulmonary lesions. Upper Abdomen: No significant upper abdominal findings. Musculoskeletal: No significant bony findings. Incidental midthoracic M angioma. Review of the MIP images confirms the above findings. IMPRESSION: 1. Suboptimal opacification of the pulmonary arteries but no findings for pulmonary embolism. 2. Normal caliber thoracic aorta.  No dissection. 3. Severe bronchitis and multifocal pneumonia. 4. Enlarged mediastinal and hilar lymph nodes likely reactive given the lung findings. Aortic Atherosclerosis (ICD10-I70.0) and Emphysema (ICD10-J43.9). Electronically Signed   By: Rudie Meyer M.D.   On: 11/16/2023 09:55        Scheduled Meds:  sodium chloride    Intravenous Once   albuterol  2.5 mg Nebulization BID   fluticasone furoate-vilanterol  1 puff Inhalation Daily   And   umeclidinium bromide  1 puff Inhalation Daily   guaiFENesin  1,200 mg Oral BID   pantoprazole (PROTONIX) IV  40 mg Intravenous Q12H   phosphorus  500 mg Oral QID   Continuous Infusions:  cefTRIAXone (ROCEPHIN)  IV Stopped (11/17/23 1005)     LOS: 2 days    Time spent: 40 min critical care time with acute anemia, concern for GI bleed    Lacretia Nicks, MD [facility]   To contact the attending provider between 7A-7P or the covering provider during after hours 7P-7A, please log into the web site [email or web address] and access using universal [place] password for that web site. If you do not have the password, please call the hospital operator.  11/18/2023, 8:31 AM
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
## 2023-11-19 ENCOUNTER — Encounter (HOSPITAL_COMMUNITY)
Admission: EM | Disposition: A | Discharge status: Home / Self Care | Payer: Self-pay | Source: Home / Self Care | Attending: Family Medicine

## 2023-11-19 ENCOUNTER — Encounter (HOSPITAL_COMMUNITY): Payer: Self-pay | Admitting: *Deleted

## 2023-11-19 ENCOUNTER — Inpatient Hospital Stay (HOSPITAL_COMMUNITY): Payer: 59 | Admitting: Anesthesiology

## 2023-11-19 DIAGNOSIS — J189 Pneumonia, unspecified organism: Secondary | ICD-10-CM | POA: Diagnosis not present

## 2023-11-19 DIAGNOSIS — J449 Chronic obstructive pulmonary disease, unspecified: Secondary | ICD-10-CM

## 2023-11-19 DIAGNOSIS — I10 Essential (primary) hypertension: Secondary | ICD-10-CM

## 2023-11-19 DIAGNOSIS — K259 Gastric ulcer, unspecified as acute or chronic, without hemorrhage or perforation: Secondary | ICD-10-CM | POA: Diagnosis not present

## 2023-11-19 DIAGNOSIS — K2980 Duodenitis without bleeding: Secondary | ICD-10-CM | POA: Diagnosis not present

## 2023-11-19 HISTORY — PX: BIOPSY: SHX5522

## 2023-11-19 HISTORY — PX: ESOPHAGOGASTRODUODENOSCOPY (EGD) WITH PROPOFOL: SHX5813

## 2023-11-19 LAB — CBC WITH DIFFERENTIAL/PLATELET
Abs Immature Granulocytes: 0.89 10*3/uL — ABNORMAL HIGH (ref 0.00–0.07)
Basophils Absolute: 0 10*3/uL (ref 0.0–0.1)
Basophils Relative: 0 %
Eosinophils Absolute: 0.1 10*3/uL (ref 0.0–0.5)
Eosinophils Relative: 1 %
HCT: 21.5 % — ABNORMAL LOW (ref 36.0–46.0)
Hemoglobin: 7.3 g/dL — ABNORMAL LOW (ref 12.0–15.0)
Immature Granulocytes: 6 %
Lymphocytes Relative: 37 %
Lymphs Abs: 5.2 10*3/uL — ABNORMAL HIGH (ref 0.7–4.0)
MCH: 28.6 pg (ref 26.0–34.0)
MCHC: 34 g/dL (ref 30.0–36.0)
MCV: 84.3 fL (ref 80.0–100.0)
Monocytes Absolute: 1.6 10*3/uL — ABNORMAL HIGH (ref 0.1–1.0)
Monocytes Relative: 11 %
Neutro Abs: 6.2 10*3/uL (ref 1.7–7.7)
Neutrophils Relative %: 45 %
Platelets: 198 10*3/uL (ref 150–400)
RBC: 2.55 MIL/uL — ABNORMAL LOW (ref 3.87–5.11)
RDW: 15.3 % (ref 11.5–15.5)
WBC: 14 10*3/uL — ABNORMAL HIGH (ref 4.0–10.5)
nRBC: 0 % (ref 0.0–0.2)

## 2023-11-19 LAB — PREPARE RBC (CROSSMATCH)

## 2023-11-19 LAB — COMPREHENSIVE METABOLIC PANEL
ALT: 14 U/L (ref 0–44)
AST: 9 U/L — ABNORMAL LOW (ref 15–41)
Albumin: 2.1 g/dL — ABNORMAL LOW (ref 3.5–5.0)
Alkaline Phosphatase: 42 U/L (ref 38–126)
Anion gap: 9 (ref 5–15)
BUN: 28 mg/dL — ABNORMAL HIGH (ref 8–23)
CO2: 21 mmol/L — ABNORMAL LOW (ref 22–32)
Calcium: 7.7 mg/dL — ABNORMAL LOW (ref 8.9–10.3)
Chloride: 110 mmol/L (ref 98–111)
Creatinine, Ser: 0.57 mg/dL (ref 0.44–1.00)
GFR, Estimated: >60 mL/min (ref >60)
Glucose, Bld: 87 mg/dL (ref 70–99)
Potassium: 3.5 mmol/L (ref 3.5–5.1)
Sodium: 140 mmol/L (ref 135–145)
Total Bilirubin: 0.7 mg/dL (ref 0.0–1.2)
Total Protein: 4.2 g/dL — ABNORMAL LOW (ref 6.5–8.1)

## 2023-11-19 LAB — HEMOGLOBIN AND HEMATOCRIT, BLOOD
HCT: 21.8 % — ABNORMAL LOW (ref 36.0–46.0)
Hemoglobin: 7.4 g/dL — ABNORMAL LOW (ref 12.0–15.0)

## 2023-11-19 LAB — GLUCOSE, CAPILLARY: Glucose-Capillary: 88 mg/dL (ref 70–99)

## 2023-11-19 LAB — PHOSPHORUS: Phosphorus: 3.2 mg/dL (ref 2.5–4.6)

## 2023-11-19 LAB — MAGNESIUM: Magnesium: 2.1 mg/dL (ref 1.7–2.4)

## 2023-11-19 SURGERY — ESOPHAGOGASTRODUODENOSCOPY (EGD) WITH PROPOFOL
Anesthesia: Monitor Anesthesia Care

## 2023-11-19 MED ORDER — LIDOCAINE HCL (CARDIAC) PF 100 MG/5ML IV SOSY
PREFILLED_SYRINGE | INTRAVENOUS | Status: DC | PRN
  Administered 2023-11-19: 80 mg via INTRATRACHEAL

## 2023-11-19 MED ORDER — SODIUM CHLORIDE 0.9 % IV SOLN: INTRAVENOUS | Status: DC | PRN

## 2023-11-19 MED ORDER — SODIUM CHLORIDE 0.9% IV SOLUTION: Freq: Once | INTRAVENOUS | Status: DC

## 2023-11-19 MED ORDER — PROPOFOL 1000 MG/100ML IV EMUL
INTRAVENOUS | Status: AC
  Filled 2023-11-19: qty 100

## 2023-11-19 MED ORDER — OXYCODONE HCL 5 MG PO TABS
5.0000 mg | ORAL_TABLET | ORAL | Status: DC | PRN
  Administered 2023-11-19: 5 mg via ORAL
  Filled 2023-11-19: qty 1

## 2023-11-19 MED ORDER — PROPOFOL 500 MG/50ML IV EMUL
INTRAVENOUS | Status: DC | PRN
  Administered 2023-11-19: 120 ug/kg/min via INTRAVENOUS

## 2023-11-19 MED ORDER — ACETAMINOPHEN 325 MG PO TABS: 650.0000 mg | ORAL_TABLET | Freq: Four times a day (QID) | ORAL | Status: DC | PRN

## 2023-11-19 MED ORDER — PROPOFOL 10 MG/ML IV BOLUS
INTRAVENOUS | Status: DC | PRN
  Administered 2023-11-19: 80 mg via INTRAVENOUS

## 2023-11-19 MED ORDER — MORPHINE SULFATE (PF) 2 MG/ML IV SOLN
2.0000 mg | INTRAVENOUS | Status: DC | PRN
  Administered 2023-11-19: 2 mg via INTRAVENOUS
  Filled 2023-11-19: qty 1

## 2023-11-19 SURGICAL SUPPLY — 14 items

## 2023-11-19 NOTE — Anesthesia Preprocedure Evaluation (Signed)
Anesthesia Evaluation  Patient identified by MRN, date of birth, ID band Patient awake    Reviewed: Allergy & Precautions, NPO status , Patient's Chart, lab work & pertinent test results  Airway Mallampati: II  TM Distance: >3 FB Neck ROM: Full    Dental   Pulmonary shortness of breath, asthma , pneumonia, unresolved, COPD   breath sounds clear to auscultation       Cardiovascular hypertension, Pt. on medications  Rhythm:Regular Rate:Normal     Neuro/Psych negative neurological ROS     GI/Hepatic Neg liver ROS,,,GI bleed    Endo/Other  negative endocrine ROS    Renal/GU negative Renal ROS     Musculoskeletal   Abdominal   Peds  Hematology  (+) Blood dyscrasia, anemia   Anesthesia Other Findings   Reproductive/Obstetrics                             Anesthesia Physical Anesthesia Plan  ASA: 3  Anesthesia Plan: MAC   Post-op Pain Management:    Induction:   PONV Risk Score and Plan: 2 and Propofol infusion  Airway Management Planned: Natural Airway and Nasal Cannula  Additional Equipment:   Intra-op Plan:   Post-operative Plan:   Informed Consent: I have reviewed the patients History and Physical, chart, labs and discussed the procedure including the risks, benefits and alternatives for the proposed anesthesia with the patient or authorized representative who has indicated his/her understanding and acceptance.       Plan Discussed with:   Anesthesia Plan Comments:        Anesthesia Quick Evaluation

## 2023-11-19 NOTE — Transfer of Care (Signed)
Immediate Anesthesia Transfer of Care Note  Patient: Lydia Griffin  Procedure(s) Performed: ESOPHAGOGASTRODUODENOSCOPY (EGD) WITH PROPOFOL BIOPSY  Patient Location: PACU  Anesthesia Type:MAC  Level of Consciousness: awake and alert   Airway & Oxygen Therapy: Patient Spontanous Breathing and Patient connected to face mask oxygen  Post-op Assessment: Report given to RN and Post -op Vital signs reviewed and stable  Post vital signs: Reviewed and stable  Last Vitals:  Vitals Value Taken Time  BP 97/52 11/19/23 1322  Temp 36.9 C 11/19/23 1322  Pulse 83 11/19/23 1322  Resp 27 11/19/23 1322  SpO2 100 % 11/19/23 1322  Vitals shown include unfiled device data.  Last Pain:  Vitals:   11/19/23 1207  TempSrc: Tympanic  PainSc: 0-No pain      Patients Stated Pain Goal: 2 (11/17/23 0800)  Complications: No notable events documented.

## 2023-11-19 NOTE — Progress Notes (Signed)
PROGRESS NOTE    Lydia Griffin  ZOX:[id number] DOB: 1961/06/26 DOA: 11/16/2023 PCP: Bess Kinds, MD  Chief Complaint  Patient presents with   Shortness of Breath    Brief Narrative:   Lydia Griffin is Lydia Griffin 63 y.o. female with medical history significant of insomnia, unconjugated hyperbilirubinemia, anxiety, depression, asthma, acute sinusitis, emphysema/COPD history of pneumonia, history of sepsis who presented to the emergency department with complaints of dyspnea, wheezing, left lower rib cage pleuritic chest pain, fever, nausea and multiple episodes of diarrhea for the past 2 days.   She's been diagnosed with multifocal pneumonia due to strep pneumoniae.   Assessment & Plan:   Principal Problem:   Multifocal pneumonia Active Problems:   Asthma exacerbation   Generalized anxiety disorder   Hypertension   Sepsis (HCC)   Prolonged QT interval   Hyponatremia   Elevated serum creatinine   COPD with acute exacerbation (HCC)  Acute Anemia Concern for upper GI bleed given elevated BUN Rectal exam with scant mucus/stool, no melena/blood noted  GI -> EGD with gastric ulcer with clean ulcer base (biopsied), gastric ulcer with flat pigmented spot, gastric ulcer with clean base, erythematous duodenopathy (biopsied) --> GI recommending PPI BID x14 days, then daily x2 months S/p 2 units pRBC (Hb dropped back down today, will give 1 additional unit today)  Multifocal Pneumonia Strep Pneumoniae Pneumonia CT chest with no PE, severe bronchitis and multifocal pneumonia She's currently on RA - improved from pneumonia standpoint, had planned for possible d/c, but will remain admitted with above anemia Urine strep positive, pending MRSA PCR ceftriaxone alone  Asthma Exacerbation Wheezing on exam, bronchitis on imaging Hold steroids given concern for GI bleed abvoe  Leukocytosis Due to above, improving  AKI Resolved  Prolonged Qtc Repeat EKG  Hypertension Apparently not taking  any antihypertensives Monitor BP  Anxiety Apparently no longer taking zoloft    DVT prophylaxis: lovenox Code Status: full Family Communication: none Disposition:   Status is: Inpatient Remains inpatient appropriate because: continued need for inpatient care   Consultants:  none  Procedures:  none  Antimicrobials:  Anti-infectives (From admission, onward)    Start     Dose/Rate Route Frequency Ordered Stop   11/17/23 1015  azithromycin (ZITHROMAX) tablet 500 mg  Status:  Discontinued        500 mg Oral Daily 11/17/23 0928 11/17/23 0929   11/17/23 1000  azithromycin (ZITHROMAX) 500 mg in sodium chloride 0.9 % 250 mL IVPB  Status:  Discontinued        500 mg 250 mL/hr over 60 Minutes Intravenous Every 24 hours 11/16/23 1013 11/17/23 0928   11/17/23 1000  cefTRIAXone (ROCEPHIN) 1 g in sodium chloride 0.9 % 100 mL IVPB        1 g 200 mL/hr over 30 Minutes Intravenous Every 24 hours 11/16/23 1015 11/21/23 0959   11/17/23 0900  cefTRIAXone (ROCEPHIN) 2 g in sodium chloride 0.9 % 100 mL IVPB  Status:  Discontinued        2 g 200 mL/hr over 30 Minutes Intravenous Every 24 hours 11/16/23 1013 11/16/23 1015   11/16/23 0845  cefTRIAXone (ROCEPHIN) 1 g in sodium chloride 0.9 % 100 mL IVPB        1 g 200 mL/hr over 30 Minutes Intravenous  Once 11/16/23 0840 11/16/23 1038   11/16/23 0845  azithromycin (ZITHROMAX) 500 mg in sodium chloride 0.9 % 250 mL IVPB        500 mg 250 mL/hr over 60  Minutes Intravenous  Once 11/16/23 0840 11/16/23 1153       Subjective: Feeling better   Objective: Vitals:   11/19/23 1340 11/19/23 1345 11/19/23 1427 11/19/23 1428  BP: (!) 114/45   120/66  Pulse: 83 85  84  Resp: (!) 25 (!) 21  17  Temp:    99 F (37.2 C)  TempSrc:   Oral Oral  SpO2: 95% 95%  98%  Weight:      Height:        Intake/Output Summary (Last 24 hours) at 11/19/2023 1644 Last data filed at 11/19/2023 1600 Gross per 24 hour  Intake 963.75 ml  Output --  Net 963.75 ml    Filed Weights   11/16/23 1900 11/19/23 1207  Weight: 81.6 kg 81.6 kg    Examination:  General: No acute distress. Cardiovascular: RRR Lungs: unlabored Abdomen: Soft, nontender, nondistended  Neurological: Alert and oriented 3. Moves all extremities 4 with equal strength. Cranial nerves II through XII grossly intact. Extremities: No clubbing or cyanosis. No edema.   Data Reviewed: I have personally reviewed following labs and imaging studies  CBC: Recent Labs  Lab 11/16/23 0724 11/17/23 0559 11/18/23 0742 11/18/23 0938 11/18/23 1710 11/18/23 2113 11/19/23 0446  WBC 20.5* 17.8* 13.7*  --   --   --  14.0*  NEUTROABS 17.7* 16.4* 9.9*  --   --   --  6.2  HGB 13.4 11.0* 6.2* 6.1* 7.3* 7.8* 7.3*  HCT 40.0 32.9* 19.6* 19.0* 21.5* 22.8* 21.5*  MCV 84.9 85.0 89.5  --   --   --  84.3  PLT 303 263 215  --   --   --  198    Basic Metabolic Panel: Recent Labs  Lab 11/16/23 0724 11/16/23 1003 11/17/23 0559 11/18/23 0533 11/19/23 0446  NA 132*  --  135 134* 140  K 3.7  --  4.4 3.8 3.5  CL 99  --  106 106 110  CO2 20*  --  20* 21* 21*  GLUCOSE 94  --  124* 101* 87  BUN 28*  --  22 76* 28*  CREATININE 1.18*  --  0.71 0.80 0.57  CALCIUM 8.9  --  8.5* 7.9* 7.7*  MG  --  1.8  --  2.5* 2.1  PHOS  --  2.8  --  1.8* 3.2    GFR: Estimated Creatinine Clearance: 75.9 mL/min (by C-G formula based on SCr of 0.57 mg/dL).  Liver Function Tests: Recent Labs  Lab 11/16/23 0724 11/17/23 0559 11/18/23 0533 11/19/23 0446  AST 29 26 13* 9*  ALT 29 8 18 14   ALKPHOS 104 85 54 42  BILITOT 1.2 0.6 0.4 0.7  PROT 7.7 6.4* 4.8* 4.2*  ALBUMIN 3.5 2.6* 2.0* 2.1*    CBG: Recent Labs  Lab 11/17/23 1201 11/17/23 1700 11/17/23 2125 11/18/23 1156 11/19/23 0920  GLUCAP 117* 112* 102* 100* 88     Recent Results (from the past 240 hours)  Resp panel by RT-PCR (RSV, Flu Mahala Rommel&B, Covid) Anterior Nasal Swab     Status: None   Collection Time: 11/16/23  7:59 AM   Specimen: Anterior  Nasal Swab  Result Value Ref Range Status   SARS Coronavirus 2 by RT PCR NEGATIVE NEGATIVE Final    Comment: (NOTE) SARS-CoV-2 target nucleic acids are NOT DETECTED.  The SARS-CoV-2 RNA is generally detectable in upper respiratory specimens during the acute phase of infection. The lowest concentration of SARS-CoV-2 viral copies this assay can detect is  138 copies/mL. Maevyn Riordan negative result does not preclude SARS-Cov-2 infection and should not be used as the sole basis for treatment or other patient management decisions. Finlee Milo negative result may occur with  improper specimen collection/handling, submission of specimen other than nasopharyngeal swab, presence of viral mutation(s) within the areas targeted by this assay, and inadequate number of viral copies(<138 copies/mL). Yanis Juma negative result must be combined with clinical observations, patient history, and epidemiological information. The expected result is Negative.  Fact Sheet for Patients:  BloggerCourse.com  Fact Sheet for Healthcare Providers:  SeriousBroker.it  This test is no t yet approved or cleared by the [place] FDA and  has been authorized for detection and/or diagnosis of SARS-CoV-2 by FDA under an Emergency Use Authorization (EUA). This EUA will remain  in effect (meaning this test can be used) for the duration of the COVID-19 declaration under Section 564(b)(1) of the Act, 21 U.S.C.section 360bbb-3(b)(1), unless the authorization is terminated  or revoked sooner.       Influenza Balraj Brayfield by PCR NEGATIVE NEGATIVE Final   Influenza B by PCR NEGATIVE NEGATIVE Final    Comment: (NOTE) The Xpert Xpress SARS-CoV-2/FLU/RSV plus assay is intended as an aid in the diagnosis of influenza from Nasopharyngeal swab specimens and should not be used as Zowie Lundahl sole basis for treatment. Nasal washings and aspirates are unacceptable for Xpert Xpress SARS-CoV-2/FLU/RSV testing.  Fact Sheet for  Patients: BloggerCourse.com  Fact Sheet for Healthcare Providers: SeriousBroker.it  This test is not yet approved or cleared by the [place] FDA and has been authorized for detection and/or diagnosis of SARS-CoV-2 by FDA under an Emergency Use Authorization (EUA). This EUA will remain in effect (meaning this test can be used) for the duration of the COVID-19 declaration under Section 564(b)(1) of the Act, 21 U.S.C. section 360bbb-3(b)(1), unless the authorization is terminated or revoked.     Resp Syncytial Virus by PCR NEGATIVE NEGATIVE Final    Comment: (NOTE) Fact Sheet for Patients: BloggerCourse.com  Fact Sheet for Healthcare Providers: SeriousBroker.it  This test is not yet approved or cleared by the [place] FDA and has been authorized for detection and/or diagnosis of SARS-CoV-2 by FDA under an Emergency Use Authorization (EUA). This EUA will remain in effect (meaning this test can be used) for the duration of the COVID-19 declaration under Section 564(b)(1) of the Act, 21 U.S.C. section 360bbb-3(b)(1), unless the authorization is terminated or revoked.  Performed at [facility], [address] [place], [place] [address]   MRSA Next Gen by PCR, Nasal     Status: None   Collection Time: 11/17/23  7:52 AM   Specimen: Nasal Mucosa; Nasal Swab  Result Value Ref Range Status   MRSA by PCR Next Gen NOT DETECTED NOT DETECTED Final    Comment: (NOTE) The GeneXpert MRSA Assay (FDA approved for NASAL specimens only), is one component of Fonnie Crookshanks comprehensive MRSA colonization surveillance program. It is not intended to diagnose MRSA infection nor to guide or monitor treatment for MRSA infections. Test performance is not FDA approved in patients less than 47 years old. Performed at [facility], [address], [place], [place]  [address]          Radiology Studies: No results found.       Scheduled Meds:  albuterol  2.5 mg Nebulization BID   fluticasone furoate-vilanterol  1 puff Inhalation Daily   And   umeclidinium bromide  1 puff Inhalation Daily   guaiFENesin  1,200  mg Oral BID   pantoprazole (PROTONIX) IV  40 mg Intravenous Q12H   Continuous Infusions:  cefTRIAXone (ROCEPHIN)  IV 1 g (11/19/23 0930)     LOS: 3 days    Time spent: 40 min critical care time with acute anemia, concern for GI bleed    Lacretia Nicks, MD [facility]   To contact the attending provider between 7A-7P or the covering provider during after hours 7P-7A, please log into the web site [email or web address] and access using universal [place] password for that web site. If you do not have the password, please call the hospital operator.  11/19/2023, 4:44 PM

## 2023-11-19 NOTE — Op Note (Signed)
[facility] Patient Name: Lydia Griffin Procedure Date: 11/19/2023 MRN: [id number] Attending MD: Liliane Shi DO, DO, [id number] Date of Birth: November 10, 1960 CSN: [id number] Age: 63 Admit Type: Inpatient Procedure:                Upper GI endoscopy Indications:              Acute post hemorrhagic anemia, Melena Providers:                Liliane Shi DO, DO, Doristine Mango, RN, Alan Ripper, Technician Referring MD:              Medicines:                See the Anesthesia note for documentation of the                            administered medications Complications:            No immediate complications. Estimated Blood Loss:     Estimated blood loss was minimal. Procedure:                Pre-Anesthesia Assessment:                           - ASA Grade Assessment: III - A patient with severe                            systemic disease.                           - The risks and benefits of the procedure and the                            sedation options and risks were discussed with the                            patient. All questions were answered and informed                            consent was obtained.                           After obtaining informed consent, the endoscope was                            passed under direct vision. Throughout the                            procedure, the patient's blood pressure, pulse, and                            oxygen saturations were monitored continuously. The                            [id number]-[id number] ([id number]) Olympus endoscope was  introduced                            through the mouth, and advanced to the second part                            of duodenum. The upper GI endoscopy was                            accomplished without difficulty. The patient                            tolerated the procedure well. Scope In: Scope Out: Findings:      The examined esophagus was normal.      One  non-bleeding cratered gastric ulcer with a clean ulcer base (Forrest       Class III) was found at the pylorus. The lesion was 4 mm in largest       dimension. Biopsies were taken with a cold forceps for Helicobacter       pylori testing.      One non-bleeding cratered gastric ulcer with a flat pigmented spot       (Forrest Class IIc) was found at the pylorus. The lesion was 3 mm in       largest dimension.      One non-bleeding cratered gastric ulcer with a clean ulcer base (Forrest       Class III) was found in the gastric antrum. The lesion was 3 mm in       largest dimension.      Patchy moderately erythematous mucosa without active bleeding and with       stigmata of bleeding was found in the duodenal bulb. Biopsies were taken       with a cold forceps for histology.      The second portion of the duodenum was normal. Impression:               - Normal esophagus.                           - Non-bleeding gastric ulcer with a clean ulcer                            base (Forrest Class III). Biopsied.                           - Non-bleeding gastric ulcer with a flat pigmented                            spot (Forrest Class IIc).                           - Non-bleeding gastric ulcer with a clean ulcer                            base (Forrest Class III).                           - Erythematous duodenopathy. Biopsied.                           -  Normal second portion of the duodenum. Moderate Sedation:      See the other procedure note for documentation of moderate sedation with       intraservice time. Recommendation:           - Return patient to hospital ward for ongoing care.                           - Resume regular diet.                           - Continue present medications.                           - Await pathology results. Procedure Code(s):        --- Professional ---                           [phone], Esophagogastroduodenoscopy, flexible,                             transoral; with biopsy, single or multiple Diagnosis Code(s):        --- Professional ---                           K25.9, Gastric ulcer, unspecified as acute or                            chronic, without hemorrhage or perforation                           K31.89, Other diseases of stomach and duodenum                           D62, Acute posthemorrhagic anemia                           K92.1, Melena (includes Hematochezia) CPT copyright 2022 American Medical Association. All rights reserved. The codes documented in this report are preliminary and upon coder review may  be revised to meet current compliance requirements. Dr Liliane Shi, DO Liliane Shi DO, DO 11/19/2023 1:27:21 PM Number of Addenda: 0

## 2023-11-19 NOTE — Interval H&P Note (Signed)
History and Physical Interval Note:  11/19/2023 12:18 PM  Lydia Griffin  has presented today for surgery, with the diagnosis of Melena, anemia.  The various methods of treatment have been discussed with the patient and family. After consideration of risks, benefits and other options for treatment, the patient has consented to  Procedure(s): ESOPHAGOGASTRODUODENOSCOPY (EGD) WITH PROPOFOL (N/A) as a surgical intervention.  The patient's history has been reviewed, patient examined, no change in status, stable for surgery.  I have reviewed the patient's chart and labs.  Questions were answered to the patient's satisfaction.     Lynann Bologna

## 2023-11-20 ENCOUNTER — Other Ambulatory Visit: Payer: Self-pay

## 2023-11-20 DIAGNOSIS — J189 Pneumonia, unspecified organism: Secondary | ICD-10-CM | POA: Diagnosis not present

## 2023-11-20 LAB — CBC WITH DIFFERENTIAL/PLATELET
Abs Immature Granulocytes: 1.93 10*3/uL — ABNORMAL HIGH (ref 0.00–0.07)
Basophils Absolute: 0.1 10*3/uL (ref 0.0–0.1)
Basophils Relative: 0 %
Eosinophils Absolute: 0.3 10*3/uL (ref 0.0–0.5)
Eosinophils Relative: 2 %
HCT: 21.9 % — ABNORMAL LOW (ref 36.0–46.0)
Hemoglobin: 7.3 g/dL — ABNORMAL LOW (ref 12.0–15.0)
Immature Granulocytes: 10 %
Lymphocytes Relative: 36 %
Lymphs Abs: 7.1 10*3/uL — ABNORMAL HIGH (ref 0.7–4.0)
MCH: 28.4 pg (ref 26.0–34.0)
MCHC: 33.3 g/dL (ref 30.0–36.0)
MCV: 85.2 fL (ref 80.0–100.0)
Monocytes Absolute: 1.8 10*3/uL — ABNORMAL HIGH (ref 0.1–1.0)
Monocytes Relative: 9 %
Neutro Abs: 8.6 10*3/uL — ABNORMAL HIGH (ref 1.7–7.7)
Neutrophils Relative %: 43 %
Platelet Morphology: NORMAL
Platelets: 260 10*3/uL (ref 150–400)
RBC: 2.57 MIL/uL — ABNORMAL LOW (ref 3.87–5.11)
RDW: 15.3 % (ref 11.5–15.5)
WBC: 19.8 10*3/uL — ABNORMAL HIGH (ref 4.0–10.5)
nRBC: 0.3 % — ABNORMAL HIGH (ref 0.0–0.2)

## 2023-11-20 LAB — COMPREHENSIVE METABOLIC PANEL
ALT: 14 U/L (ref 0–44)
AST: 10 U/L — ABNORMAL LOW (ref 15–41)
Albumin: 2.4 g/dL — ABNORMAL LOW (ref 3.5–5.0)
Alkaline Phosphatase: 54 U/L (ref 38–126)
Anion gap: 5 (ref 5–15)
BUN: 10 mg/dL (ref 8–23)
CO2: 24 mmol/L (ref 22–32)
Calcium: 7.9 mg/dL — ABNORMAL LOW (ref 8.9–10.3)
Chloride: 104 mmol/L (ref 98–111)
Creatinine, Ser: 0.74 mg/dL (ref 0.44–1.00)
GFR, Estimated: >60 mL/min (ref >60)
Glucose, Bld: 96 mg/dL (ref 70–99)
Potassium: 3.9 mmol/L (ref 3.5–5.1)
Sodium: 133 mmol/L — ABNORMAL LOW (ref 135–145)
Total Bilirubin: 0.7 mg/dL (ref 0.0–1.2)
Total Protein: 5.2 g/dL — ABNORMAL LOW (ref 6.5–8.1)

## 2023-11-20 LAB — PHOSPHORUS: Phosphorus: 3.2 mg/dL (ref 2.5–4.6)

## 2023-11-20 LAB — PREPARE RBC (CROSSMATCH)

## 2023-11-20 LAB — MAGNESIUM: Magnesium: 2.2 mg/dL (ref 1.7–2.4)

## 2023-11-20 MED ORDER — SODIUM CHLORIDE 0.9% IV SOLUTION: Freq: Once | INTRAVENOUS | Status: AC

## 2023-11-20 MED ORDER — FERROUS SULFATE 325 (65 FE) MG PO TABS
325.0000 mg | ORAL_TABLET | ORAL | Status: DC
  Administered 2023-11-20: 325 mg via ORAL
  Filled 2023-11-20: qty 1

## 2023-11-20 MED ORDER — PANTOPRAZOLE SODIUM 40 MG PO TBEC
DELAYED_RELEASE_TABLET | ORAL | 0 refills | Status: DC
  Filled 2023-11-20: qty 44, 30d supply, fill #0

## 2023-11-20 MED ORDER — AMOXICILLIN 500 MG PO CAPS
1000.0000 mg | ORAL_CAPSULE | Freq: Three times a day (TID) | ORAL | 0 refills | Status: DC
  Filled 2023-11-20: qty 18, 3d supply, fill #0

## 2023-11-20 MED ORDER — FERROUS SULFATE 325 (65 FE) MG PO TABS
325.0000 mg | ORAL_TABLET | ORAL | 1 refills | Status: DC
  Filled 2023-11-20: qty 15, 30d supply, fill #0

## 2023-11-20 MED ORDER — MELATONIN 5 MG PO TABS
5.0000 mg | ORAL_TABLET | Freq: Once | ORAL | Status: AC
  Administered 2023-11-20: 5 mg via ORAL
  Filled 2023-11-20: qty 1

## 2023-11-20 NOTE — Discharge Summary (Addendum)
Physician Discharge Summary  Lydia Griffin WUJ:[id number] DOB: Apr 29, 1961 DOA: 11/16/2023  PCP: Bess Kinds, MD  Admit date: 11/16/2023 Discharge date: 11/20/2023  Time spent: 40 minutes  Recommendations for Outpatient Follow-up:  Follow outpatient CBC/CMP  Needs repeat CXR outpatient Follow leukocytosis outpatient Follow anemia outpatient   Symptomatic anemia on day of discharge, awaiting blood prior to discharge - can discharge 1/29 given blood given late in afternoon  Discharge Diagnoses:  Principal Problem:   Multifocal pneumonia Active Problems:   Asthma exacerbation   Generalized anxiety disorder   Hypertension   Sepsis (HCC)   Prolonged QT interval   Hyponatremia   Elevated serum creatinine   COPD with acute exacerbation [facility])   Discharge Condition: stable  Diet recommendation: heart healthy  Filed Weights   11/16/23 1900 11/19/23 1207  Weight: 81.6 kg 81.6 kg    History of present illness:   Lydia Griffin is Lydia Griffin 63 y.o. female with medical history significant of insomnia, unconjugated hyperbilirubinemia, anxiety, depression, asthma, acute sinusitis, emphysema/COPD history of pneumonia, history of sepsis who presented to the emergency department with complaints of dyspnea, wheezing, left lower rib cage pleuritic chest pain, fever, nausea and multiple episodes of diarrhea for the past 2 days.    She's been diagnosed with multifocal pneumonia due to strep pneumoniae.   Hospitalization complicated by acute blood loss anemia related to peptic ulcer disease.  Now improved, s/p EGD by GI.   See below for additional details.  Hospital Course:  Assessment and Plan:  Acute Blood Loss Anemia Symptomatic Anemia Peptic Ulcer Disease Concern for upper GI bleed given elevated BUN Rectal exam with scant mucus/stool, no melena/blood noted  GI -> EGD with gastric ulcer with clean ulcer base (biopsied), gastric ulcer with flat pigmented spot, gastric ulcer with clean  base, erythematous duodenopathy (biopsied) --> GI recommending PPI BID x14 days, then daily x2 months S/p 2 units pRBC (3rd unit ordered on day of discharge due to lightheadedness with ambulation - Hb was otherwise stable)   Multifocal Pneumonia Strep Pneumoniae Pneumonia CT chest with no PE, severe bronchitis and multifocal pneumonia She's currently on RA - improved from pneumonia standpoint Urine strep positive, pending MRSA PCR (not done) Will discharge with another few days of amoxicillin.   Asthma Exacerbation Wheezing on exam, bronchitis on imaging Hold steroids given concern for GI bleed abvoe Wheezing has resolved   Leukocytosis Elevated today, was previously improving -> follow outpatient, she's clinically improved   AKI Resolved   Prolonged Qtc Repeat EKG outpatient   Hypertension Apparently not taking any antihypertensives Monitor BP   Anxiety Apparently no longer taking zoloft    Procedures: EGD   Consultations: GI  Discharge Exam: Vitals:   11/20/23 0931 11/20/23 1157  BP:  130/71  Pulse:  83  Resp:  16  Temp:  98.6 F (37 C)  SpO2: 94% 97%   No complaints Feeling better, but still notes some melena Notes some lightheadedness  General: No acute distress. Cardiovascular: Heart sounds show Lydia Griffin regular rate, and rhythm.  Lungs: Clear to auscultation bilaterally Abdomen: Soft, nontender, nondistended Neurological: Alert and oriented 3. Moves all extremities 4 with equal strength. Cranial nerves II through XII grossly intact. Extremities: No clubbing or cyanosis. No edema.  Discharge Instructions   Discharge Instructions     Call MD for:  difficulty breathing, headache or visual disturbances   Complete by: As directed    Call MD for:  extreme fatigue   Complete by: As directed  Call MD for:  hives   Complete by: As directed    Call MD for:  persistant dizziness or light-headedness   Complete by: As directed    Call MD for:  persistant  nausea and vomiting   Complete by: As directed    Call MD for:  redness, tenderness, or signs of infection (pain, swelling, redness, odor or green/yellow discharge around incision site)   Complete by: As directed    Call MD for:  severe uncontrolled pain   Complete by: As directed    Call MD for:  temperature >100.4   Complete by: As directed    Diet - low sodium heart healthy   Complete by: As directed    Discharge instructions   Complete by: As directed    You were seen for pneumonia.  You had strep pneumoniae infection.  We'll complete Lydia Griffin course of antibiotics with amoxicillin.  Your hospital stay was complicated by Lydia Griffin gastrointestinal bleed.  You were evaluated by gastroenterology and had and endoscopy which showed gastric ulcers and erythematous duodenopathy.  There were biopsies taken that gastroenterology will follow up with you (please ensure you follow up these results with them). We gave you 3 units of blood and will discharge you on iron.  Follow up with gastroenterology outpatient.  Your white blood cell count is up today.  You'll need to follow this up with your PCP outpatient with repeat labs.  If you have recurrent fevers, night sweats, or other worsening infectious symptoms, return to the ED or call your PCP.  Return for new, recurrent, or worsening symptoms.  Please ask your PCP to request records from this hospitalization so they know what was done and what the next steps will be.   Increase activity slowly   Complete by: As directed       Allergies as of 11/20/2023   No Known Allergies      Medication List     STOP taking these medications    amLODipine 2.5 MG tablet Commonly known as: NORVASC   baclofen 10 MG tablet Commonly known as: LIORESAL   cetirizine 10 MG tablet Commonly known as: ZYRTEC   fluticasone furoate-vilanterol 100-25 MCG/ACT Aepb Commonly known as: BREO ELLIPTA   sertraline 100 MG tablet Commonly known as: ZOLOFT   tiotropium 18 MCG  inhalation capsule Commonly known as: Spiriva HandiHaler       TAKE these medications    albuterol 108 (90 Base) MCG/ACT inhaler Commonly known as: VENTOLIN HFA Inhale 2 puffs into the lungs every 6 (six) hours as needed for wheezing or shortness of breath.   albuterol 108 (90 Base) MCG/ACT inhaler Commonly known as: VENTOLIN HFA Inhale 2 puffs into the lungs every 6 (six) hours as needed for wheezing or shortness of breath.   amoxicillin 500 MG capsule Commonly known as: AMOXIL Take 2 capsules (1,000 mg total) by mouth 3 (three) times daily for 3 days.   ferrous sulfate 325 (65 FE) MG tablet Take 1 tablet (325 mg total) by mouth every other day. Start taking on: November 22, 2023   ipratropium-albuterol 0.5-2.5 (3) MG/3ML Soln Commonly known as: DUONEB USE 1 VIAL VIA NEBULIZER EVERY 4 HOURS AS NEEDED What changed: See the new instructions.   montelukast 10 MG tablet Commonly known as: SINGULAIR Take 1 tablet (10 mg total) by mouth at bedtime. What changed: when to take this   multivitamin with minerals tablet Take 1 tablet by mouth daily.   pantoprazole 40 MG tablet Commonly  known as: Protonix Take 1 tablet (40 mg total) by mouth 2 (two) times daily for 14 days, THEN 1 tablet (40 mg total) daily. Follow up with GI outpatient for further instructions. Start taking on: November 20, 2023   Trelegy Ellipta 100-62.5-25 MCG/ACT Aepb Generic drug: Fluticasone-Umeclidin-Vilant Inhale 1 puff into the lungs daily. What changed: when to take this       No Known Allergies    The results of significant diagnostics from this hospitalization (including imaging, microbiology, ancillary and laboratory) are listed below for reference.    Significant Diagnostic Studies: CT Angio Chest PE W and/or Wo Contrast Result Date: 11/16/2023 CLINICAL DATA:  Left-sided pleuritic chest pain, shortness of breath and fever. Abnormal chest x-ray. EXAM: CT ANGIOGRAPHY CHEST WITH CONTRAST  TECHNIQUE: Multidetector CT imaging of the chest was performed using the standard protocol during bolus administration of intravenous contrast. Multiplanar CT image reconstructions and MIPs were obtained to evaluate the vascular anatomy. RADIATION DOSE REDUCTION: This exam was performed according to the departmental dose-optimization program which includes automated exposure control, adjustment of the mA and/or kV according to patient size and/or use of iterative reconstruction technique. CONTRAST:  80mL OMNIPAQUE IOHEXOL 350 MG/ML SOLN COMPARISON:  Chest CT from 2022 FINDINGS: Cardiovascular: The heart is normal in size. No pericardial effusion. The aorta is normal in caliber. No dissection. Scattered atherosclerotic calcifications. Scattered coronary artery calcifications. Suboptimal opacification of the pulmonary arteries but no findings for pulmonary embolism. Mediastinum/Nodes: Enlarged mediastinal and hilar lymph nodes likely reactive given the lung findings. The esophagus is grossly. Lungs/Pleura: Stable underlying chronic lung changes with emphysematous changes and pulmonary scarring. Significant peribronchial thickening, areas of interstitial prominence and multifocal airspace opacities most significant in the left lower lobe with lobar pneumonia. No pleural effusions. No pneumothorax. No discrete pulmonary lesions. Upper Abdomen: No significant upper abdominal findings. Musculoskeletal: No significant bony findings. Incidental midthoracic M angioma. Review of the MIP images confirms the above findings. IMPRESSION: 1. Suboptimal opacification of the pulmonary arteries but no findings for pulmonary embolism. 2. Normal caliber thoracic aorta.  No dissection. 3. Severe bronchitis and multifocal pneumonia. 4. Enlarged mediastinal and hilar lymph nodes likely reactive given the lung findings. Aortic Atherosclerosis (ICD10-I70.0) and Emphysema (ICD10-J43.9). Electronically Signed   By: Rudie Meyer M.D.   On:  11/16/2023 09:55   DG Chest Portable 1 View Result Date: 11/16/2023 CLINICAL DATA:  Pleuritic chest pain with cough. EXAM: PORTABLE CHEST 1 VIEW COMPARISON:  05/23/2023 FINDINGS: Focal masslike opacity noted right lung base without substantial pleural effusion. Diffuse hazy opacity overlying both lower lungs may be related to superimposition of soft tissue. No pulmonary edema or pneumothorax. Cardiopericardial silhouette is at upper limits of normal for size. Telemetry leads overlie the chest. IMPRESSION: Focal masslike opacity right lung base. While this may be related to pneumonia, underlying mass lesion not excluded. Consider CT chest without contrast to further evaluate. Electronically Signed   By: Kennith Center M.D.   On: 11/16/2023 07:50    Microbiology: Recent Results (from the past 240 hours)  Resp panel by RT-PCR (RSV, Flu Jataya Wann&B, Covid) Anterior Nasal Swab     Status: None   Collection Time: 11/16/23  7:59 AM   Specimen: Anterior Nasal Swab  Result Value Ref Range Status   SARS Coronavirus 2 by RT PCR NEGATIVE NEGATIVE Final    Comment: (NOTE) SARS-CoV-2 target nucleic acids are NOT DETECTED.  The SARS-CoV-2 RNA is generally detectable in upper respiratory specimens during the acute phase of infection. The  lowest concentration of SARS-CoV-2 viral copies this assay can detect is 138 copies/mL. Aijah Lattner negative result does not preclude SARS-Cov-2 infection and should not be used as the sole basis for treatment or other patient management decisions. Glenola Wheat negative result may occur with  improper specimen collection/handling, submission of specimen other than nasopharyngeal swab, presence of viral mutation(s) within the areas targeted by this assay, and inadequate number of viral copies(<138 copies/mL). Sherby Moncayo negative result must be combined with clinical observations, patient history, and epidemiological information. The expected result is Negative.  Fact Sheet for Patients:   BloggerCourse.com  Fact Sheet for Healthcare Providers:  SeriousBroker.it  This test is no t yet approved or cleared by the [place] FDA and  has been authorized for detection and/or diagnosis of SARS-CoV-2 by FDA under an Emergency Use Authorization (EUA). This EUA will remain  in effect (meaning this test can be used) for the duration of the COVID-19 declaration under Section 564(b)(1) of the Act, 21 U.S.C.section 360bbb-3(b)(1), unless the authorization is terminated  or revoked sooner.       Influenza Jermanie Minshall by PCR NEGATIVE NEGATIVE Final   Influenza B by PCR NEGATIVE NEGATIVE Final    Comment: (NOTE) The Xpert Xpress SARS-CoV-2/FLU/RSV plus assay is intended as an aid in the diagnosis of influenza from Nasopharyngeal swab specimens and should not be used as Rashel Okeefe sole basis for treatment. Nasal washings and aspirates are unacceptable for Xpert Xpress SARS-CoV-2/FLU/RSV testing.  Fact Sheet for Patients: BloggerCourse.com  Fact Sheet for Healthcare Providers: SeriousBroker.it  This test is not yet approved or cleared by the [place] FDA and has been authorized for detection and/or diagnosis of SARS-CoV-2 by FDA under an Emergency Use Authorization (EUA). This EUA will remain in effect (meaning this test can be used) for the duration of the COVID-19 declaration under Section 564(b)(1) of the Act, 21 U.S.C. section 360bbb-3(b)(1), unless the authorization is terminated or revoked.     Resp Syncytial Virus by PCR NEGATIVE NEGATIVE Final    Comment: (NOTE) Fact Sheet for Patients: BloggerCourse.com  Fact Sheet for Healthcare Providers: SeriousBroker.it  This test is not yet approved or cleared by the [place] FDA and has been authorized for detection and/or diagnosis of SARS-CoV-2 by FDA under an Emergency Use  Authorization (EUA). This EUA will remain in effect (meaning this test can be used) for the duration of the COVID-19 declaration under Section 564(b)(1) of the Act, 21 U.S.C. section 360bbb-3(b)(1), unless the authorization is terminated or revoked.  Performed at [facility], [address] [place], [place] [address]   MRSA Next Gen by PCR, Nasal     Status: None   Collection Time: 11/17/23  7:52 AM   Specimen: Nasal Mucosa; Nasal Swab  Result Value Ref Range Status   MRSA by PCR Next Gen NOT DETECTED NOT DETECTED Final    Comment: (NOTE) The GeneXpert MRSA Assay (FDA approved for NASAL specimens only), is one component of Emran Molzahn comprehensive MRSA colonization surveillance program. It is not intended to diagnose MRSA infection nor to guide or monitor treatment for MRSA infections. Test performance is not FDA approved in patients less than 87 years old. Performed at [facility], [address], [place], [place] [address]      Labs: Basic Metabolic Panel: Recent Labs  Lab 11/16/23 0724 11/16/23 1003 11/17/23 0559 11/18/23 0533 11/19/23 0446 11/20/23 0528  NA 132*  --  135 134* 140 133*  K 3.7  --  4.4 3.8 3.5  3.9  CL 99  --  106 106 110 104  CO2 20*  --  20* 21* 21* 24  GLUCOSE 94  --  124* 101* 87 96  BUN 28*  --  22 76* 28* 10  CREATININE 1.18*  --  0.71 0.80 0.57 0.74  CALCIUM 8.9  --  8.5* 7.9* 7.7* 7.9*  MG  --  1.8  --  2.5* 2.1 2.2  PHOS  --  2.8  --  1.8* 3.2 3.2   Liver Function Tests: Recent Labs  Lab 11/16/23 0724 11/17/23 0559 11/18/23 0533 11/19/23 0446 11/20/23 0528  AST 29 26 13* 9* 10*  ALT 29 8 18 14 14   ALKPHOS 104 85 54 42 54  BILITOT 1.2 0.6 0.4 0.7 0.7  PROT 7.7 6.4* 4.8* 4.2* 5.2*  ALBUMIN 3.5 2.6* 2.0* 2.1* 2.4*   Recent Labs  Lab 11/16/23 0724 11/18/23 0938  LIPASE 22 24   No results for input(s): "AMMONIA" in the last 168 hours. CBC: Recent Labs  Lab 11/16/23 0724 11/17/23 0559  11/18/23 0742 11/18/23 0938 11/18/23 1710 11/18/23 2113 11/19/23 0446 11/19/23 1751 11/20/23 0528  WBC 20.5* 17.8* 13.7*  --   --   --  14.0*  --  19.8*  NEUTROABS 17.7* 16.4* 9.9*  --   --   --  6.2  --  8.6*  HGB 13.4 11.0* 6.2*   < > 7.3* 7.8* 7.3* 7.4* 7.3*  HCT 40.0 32.9* 19.6*   < > 21.5* 22.8* 21.5* 21.8* 21.9*  MCV 84.9 85.0 89.5  --   --   --  84.3  --  85.2  PLT 303 263 215  --   --   --  198  --  260   < > = values in this interval not displayed.   Cardiac Enzymes: No results for input(s): "CKTOTAL", "CKMB", "CKMBINDEX", "TROPONINI" in the last 168 hours. BNP: BNP (last 3 results) No results for input(s): "BNP" in the last 8760 hours.  ProBNP (last 3 results) No results for input(s): "PROBNP" in the last 8760 hours.  CBG: Recent Labs  Lab 11/17/23 1201 11/17/23 1700 11/17/23 2125 11/18/23 1156 11/19/23 0920  GLUCAP 117* 112* 102* 100* 88       Signed:  Lacretia Nicks MD.  [facility] 11/20/2023, 5:15 PM

## 2023-11-20 NOTE — Anesthesia Postprocedure Evaluation (Signed)
Anesthesia Post Note  Patient: MELAYAH SKORUPSKI  Procedure(s) Performed: ESOPHAGOGASTRODUODENOSCOPY (EGD) WITH PROPOFOL BIOPSY     Patient location during evaluation: PACU Anesthesia Type: MAC Level of consciousness: awake and alert Pain management: pain level controlled Vital Signs Assessment: post-procedure vital signs reviewed and stable Respiratory status: spontaneous breathing, nonlabored ventilation, respiratory function stable and patient connected to nasal cannula oxygen Cardiovascular status: stable and blood pressure returned to baseline Postop Assessment: no apparent nausea or vomiting Anesthetic complications: no   No notable events documented.  Last Vitals:  Vitals:   11/19/23 2225 11/20/23 0542  BP:  125/69  Pulse:  82  Resp:  18  Temp:  37 C  SpO2: 97% 98%    Last Pain:  Vitals:   11/20/23 0542  TempSrc: Oral  PainSc:    Pain Goal: Patients Stated Pain Goal: 2 (11/17/23 0800)                 Kennieth Rad

## 2023-11-21 ENCOUNTER — Other Ambulatory Visit (HOSPITAL_COMMUNITY): Payer: Self-pay

## 2023-11-21 ENCOUNTER — Other Ambulatory Visit: Payer: Self-pay

## 2023-11-21 ENCOUNTER — Encounter (HOSPITAL_COMMUNITY): Payer: Self-pay | Admitting: Internal Medicine

## 2023-11-21 DIAGNOSIS — J189 Pneumonia, unspecified organism: Secondary | ICD-10-CM | POA: Diagnosis not present

## 2023-11-21 LAB — BPAM RBC
Blood Product Expiration Date: 202502202359
Blood Product Expiration Date: 202502222359
Blood Product Expiration Date: 202503022359
ISSUE DATE / TIME: 202501261247
ISSUE DATE / TIME: 202501261702
ISSUE DATE / TIME: 202501281734
Unit Type and Rh: 202503022359
Unit Type and Rh: 5100
Unit Type and Rh: 5100
Unit Type and Rh: 5100

## 2023-11-21 LAB — TYPE AND SCREEN
ABO/RH(D): O POS
Antibody Screen: NEGATIVE
Unit division: 0
Unit division: 0
Unit division: 0

## 2023-11-21 LAB — HEMOGLOBIN AND HEMATOCRIT, BLOOD
HCT: 25.5 % — ABNORMAL LOW (ref 36.0–46.0)
Hemoglobin: 8.7 g/dL — ABNORMAL LOW (ref 12.0–15.0)

## 2023-11-21 LAB — SURGICAL PATHOLOGY

## 2023-11-21 MED ORDER — AMOXICILLIN 500 MG PO CAPS
1000.0000 mg | ORAL_CAPSULE | Freq: Three times a day (TID) | ORAL | 0 refills | Status: AC
  Filled 2023-11-21 (×2): qty 18, 3d supply, fill #0

## 2023-11-21 MED ORDER — PANTOPRAZOLE SODIUM 40 MG PO TBEC
DELAYED_RELEASE_TABLET | ORAL | 0 refills | Status: DC
  Filled 2023-11-21: qty 44, 30d supply, fill #0
  Filled 2023-11-21: qty 88, 74d supply, fill #0

## 2023-11-21 MED ORDER — FERROUS SULFATE 325 (65 FE) MG PO TABS
325.0000 mg | ORAL_TABLET | ORAL | 1 refills | Status: DC
  Filled 2023-11-21 (×2): qty 15, 30d supply, fill #0

## 2023-11-21 NOTE — Progress Notes (Signed)
Pt discharged with sister home prior to receiving Meds to [facility], this RN called Pt back to see when she'd be able to come back to pick them up. Pt stated unable to come back today and can come by tomorrow. Home meds will be locked in cabinet on floor until pick up. Charge RN aware.

## 2023-11-21 NOTE — Discharge Summary (Signed)
Physician Discharge Summary  Lydia Griffin ZOX:[id number] DOB: 1960-12-26 DOA: 11/16/2023  PCP: Bess Kinds, MD  Admit date: 11/16/2023 Discharge date: 11/21/2023  Admitted From: home Disposition:  home  Recommendations for Outpatient Follow-up:  Follow up with PCP in 1-2 weeks Please obtain BMP/CBC in one week  Home Health: none Equipment/Devices: none  Discharge Condition: stable CODE STATUS: Full code Diet Orders (From admission, onward)     Start     Ordered   11/20/23 0000  Diet - low sodium heart healthy        11/20/23 1714   11/19/23 1417  Diet regular Room service appropriate? Yes; Fluid consistency: Thin  Diet effective now       Question Answer Comment  Room service appropriate? Yes   Fluid consistency: Thin      11/19/23 1416            HPI Lydia Griffin is a 63 y.o. female with medical history significant of insomnia, unconjugated hyperbilirubinemia, anxiety, depression, asthma, acute sinusitis, emphysema/COPD history of pneumonia, history of sepsis who presented to the emergency department with complaints of dyspnea, wheezing, left lower rib cage pleuritic chest pain, fever, nausea and multiple episodes of diarrhea for the past 2 days. She's been diagnosed with multifocal pneumonia due to strep pneumoniae. Hospitalization complicated by acute blood loss anemia related to peptic ulcer disease.  Now improved, s/p EGD by GI.   Hospital Course / Discharge diagnoses: Principal Problem:   Multifocal pneumonia Active Problems:   Asthma exacerbation   Generalized anxiety disorder   Hypertension   Sepsis (HCC)   Prolonged QT interval   Hyponatremia   Elevated serum creatinine   COPD with acute exacerbation (HCC)  Principal problem Acute Blood Loss Anemia, Peptic Ulcer Disease - Concern for upper GI bleed given elevated BUN. GI consulted, underwent an EGD which showed a gastric ulcer with clean ulcer base (biopsied), gastric ulcer with flat pigmented  spot, gastric ulcer with clean base, erythematous duodenopathy (biopsied) --> GI recommending PPI BID x14 days, then daily x2 months.  Received total of 3 units of packed red blood cells, hemoglobin has improved appropriately and clinically bleeding has resolved   Active problems Multifocal Pneumonia, Strep Pneumoniae Pneumonia - CT chest with no PE, severe bronchitis and multifocal pneumonia. She's currently on RA - improved from pneumonia standpoint. Urine strep positive, has been placed on amoxicillin for few additional days Asthma Exacerbation - Wheezing on exam, bronchitis on imaging on admission, received steroids but wheezing now resolved and will not need further dosing Leukocytosis-due to steroids AKI - Resolved Hypertension - Apparently not taking any antihypertensives Anxiety - Apparently no longer taking zoloft  Sepsis ruled out   Discharge Instructions  Discharge Instructions     Call MD for:  difficulty breathing, headache or visual disturbances   Complete by: As directed    Call MD for:  extreme fatigue   Complete by: As directed    Call MD for:  hives   Complete by: As directed    Call MD for:  persistant dizziness or light-headedness   Complete by: As directed    Call MD for:  persistant nausea and vomiting   Complete by: As directed    Call MD for:  redness, tenderness, or signs of infection (pain, swelling, redness, odor or green/yellow discharge around incision site)   Complete by: As directed    Call MD for:  severe uncontrolled pain   Complete by: As directed  Call MD for:  temperature >100.4   Complete by: As directed    Diet - low sodium heart healthy   Complete by: As directed    Discharge instructions   Complete by: As directed    You were seen for pneumonia.  You had strep pneumoniae infection.  We'll complete a course of antibiotics with amoxicillin.  Your hospital stay was complicated by a gastrointestinal bleed.  You were evaluated by  gastroenterology and had and endoscopy which showed gastric ulcers and erythematous duodenopathy.  There were biopsies taken that gastroenterology will follow up with you (please ensure you follow up these results with them). We gave you 3 units of blood and will discharge you on iron.  Follow up with gastroenterology outpatient.  Your white blood cell count is up today.  You'll need to follow this up with your PCP outpatient with repeat labs.  If you have recurrent fevers, night sweats, or other worsening infectious symptoms, return to the ED or call your PCP.  Please get a follow up chest image with your PCP within a few weeks.  Return for new, recurrent, or worsening symptoms.  Please ask your PCP to request records from this hospitalization so they know what was done and what the next steps will be.   Increase activity slowly   Complete by: As directed       Allergies as of 11/21/2023   No Known Allergies      Medication List     STOP taking these medications    amLODipine 2.5 MG tablet Commonly known as: NORVASC   baclofen 10 MG tablet Commonly known as: LIORESAL   cetirizine 10 MG tablet Commonly known as: ZYRTEC   fluticasone furoate-vilanterol 100-25 MCG/ACT Aepb Commonly known as: BREO ELLIPTA   sertraline 100 MG tablet Commonly known as: ZOLOFT   tiotropium 18 MCG inhalation capsule Commonly known as: Spiriva HandiHaler       TAKE these medications    albuterol 108 (90 Base) MCG/ACT inhaler Commonly known as: VENTOLIN HFA Inhale 2 puffs into the lungs every 6 (six) hours as needed for wheezing or shortness of breath.   albuterol 108 (90 Base) MCG/ACT inhaler Commonly known as: VENTOLIN HFA Inhale 2 puffs into the lungs every 6 (six) hours as needed for wheezing or shortness of breath.   amoxicillin 500 MG capsule Commonly known as: AMOXIL Take 2 capsules (1,000 mg total) by mouth 3 (three) times daily for 3 days.   ferrous sulfate 325 (65 FE) MG  tablet Take 1 tablet (325 mg total) by mouth every other day. Start taking on: November 22, 2023   ipratropium-albuterol 0.5-2.5 (3) MG/3ML Soln Commonly known as: DUONEB USE 1 VIAL VIA NEBULIZER EVERY 4 HOURS AS NEEDED What changed: See the new instructions.   montelukast 10 MG tablet Commonly known as: SINGULAIR Take 1 tablet (10 mg total) by mouth at bedtime. What changed: when to take this   multivitamin with minerals tablet Take 1 tablet by mouth daily.   pantoprazole 40 MG tablet Commonly known as: Protonix Take 1 tablet (40 mg total) by mouth 2 (two) times daily for 14 days, THEN 1 tablet (40 mg total) daily. Follow up with GI outpatient for further instructions. Start taking on: November 21, 2023   Trelegy Ellipta 100-62.5-25 MCG/ACT Aepb Generic drug: Fluticasone-Umeclidin-Vilant Inhale 1 puff into the lungs daily. What changed: when to take this       Consultations: GI  Procedures/Studies: none  CT Angio Chest PE  W and/or Wo Contrast Result Date: 11/16/2023 CLINICAL DATA:  Left-sided pleuritic chest pain, shortness of breath and fever. Abnormal chest x-ray. EXAM: CT ANGIOGRAPHY CHEST WITH CONTRAST TECHNIQUE: Multidetector CT imaging of the chest was performed using the standard protocol during bolus administration of intravenous contrast. Multiplanar CT image reconstructions and MIPs were obtained to evaluate the vascular anatomy. RADIATION DOSE REDUCTION: This exam was performed according to the departmental dose-optimization program which includes automated exposure control, adjustment of the mA and/or kV according to patient size and/or use of iterative reconstruction technique. CONTRAST:  80mL OMNIPAQUE IOHEXOL 350 MG/ML SOLN COMPARISON:  Chest CT from 2022 FINDINGS: Cardiovascular: The heart is normal in size. No pericardial effusion. The aorta is normal in caliber. No dissection. Scattered atherosclerotic calcifications. Scattered coronary artery calcifications.  Suboptimal opacification of the pulmonary arteries but no findings for pulmonary embolism. Mediastinum/Nodes: Enlarged mediastinal and hilar lymph nodes likely reactive given the lung findings. The esophagus is grossly. Lungs/Pleura: Stable underlying chronic lung changes with emphysematous changes and pulmonary scarring. Significant peribronchial thickening, areas of interstitial prominence and multifocal airspace opacities most significant in the left lower lobe with lobar pneumonia. No pleural effusions. No pneumothorax. No discrete pulmonary lesions. Upper Abdomen: No significant upper abdominal findings. Musculoskeletal: No significant bony findings. Incidental midthoracic M angioma. Review of the MIP images confirms the above findings. IMPRESSION: 1. Suboptimal opacification of the pulmonary arteries but no findings for pulmonary embolism. 2. Normal caliber thoracic aorta.  No dissection. 3. Severe bronchitis and multifocal pneumonia. 4. Enlarged mediastinal and hilar lymph nodes likely reactive given the lung findings. Aortic Atherosclerosis (ICD10-I70.0) and Emphysema (ICD10-J43.9). Electronically Signed   By: Rudie Meyer M.D.   On: 11/16/2023 09:55   DG Chest Portable 1 View Result Date: 11/16/2023 CLINICAL DATA:  Pleuritic chest pain with cough. EXAM: PORTABLE CHEST 1 VIEW COMPARISON:  05/23/2023 FINDINGS: Focal masslike opacity noted right lung base without substantial pleural effusion. Diffuse hazy opacity overlying both lower lungs may be related to superimposition of soft tissue. No pulmonary edema or pneumothorax. Cardiopericardial silhouette is at upper limits of normal for size. Telemetry leads overlie the chest. IMPRESSION: Focal masslike opacity right lung base. While this may be related to pneumonia, underlying mass lesion not excluded. Consider CT chest without contrast to further evaluate. Electronically Signed   By: Kennith Center M.D.   On: 11/16/2023 07:50     Subjective: - no chest  pain, shortness of breath, no abdominal pain, nausea or vomiting.   Discharge Exam: BP 127/64 (BP Location: Right Arm)   Pulse 72   Temp 98.2 F (36.8 C) (Oral)   Resp 12   Ht 5\' 5"  (1.651 m)   Wt 81.6 kg   SpO2 98%   BMI 29.94 kg/m   General: Pt is alert, awake, not in acute distress Cardiovascular: RRR, S1/S2 +, no rubs, no gallops Respiratory: CTA bilaterally, no wheezing, no rhonchi Abdominal: Soft, NT, ND, bowel sounds + Extremities: no edema, no cyanosis    The results of significant diagnostics from this hospitalization (including imaging, microbiology, ancillary and laboratory) are listed below for reference.     Microbiology: Recent Results (from the past 240 hours)  Resp panel by RT-PCR (RSV, Flu A&B, Covid) Anterior Nasal Swab     Status: None   Collection Time: 11/16/23  7:59 AM   Specimen: Anterior Nasal Swab  Result Value Ref Range Status   SARS Coronavirus 2 by RT PCR NEGATIVE NEGATIVE Final    Comment: (NOTE) SARS-CoV-2 target  nucleic acids are NOT DETECTED.  The SARS-CoV-2 RNA is generally detectable in upper respiratory specimens during the acute phase of infection. The lowest concentration of SARS-CoV-2 viral copies this assay can detect is 138 copies/mL. A negative result does not preclude SARS-Cov-2 infection and should not be used as the sole basis for treatment or other patient management decisions. A negative result may occur with  improper specimen collection/handling, submission of specimen other than nasopharyngeal swab, presence of viral mutation(s) within the areas targeted by this assay, and inadequate number of viral copies(<138 copies/mL). A negative result must be combined with clinical observations, patient history, and epidemiological information. The expected result is Negative.  Fact Sheet for Patients:  BloggerCourse.com  Fact Sheet for Healthcare Providers:   SeriousBroker.it  This test is no t yet approved or cleared by the [place] FDA and  has been authorized for detection and/or diagnosis of SARS-CoV-2 by FDA under an Emergency Use Authorization (EUA). This EUA will remain  in effect (meaning this test can be used) for the duration of the COVID-19 declaration under Section 564(b)(1) of the Act, 21 U.S.C.section 360bbb-3(b)(1), unless the authorization is terminated  or revoked sooner.       Influenza A by PCR NEGATIVE NEGATIVE Final   Influenza B by PCR NEGATIVE NEGATIVE Final    Comment: (NOTE) The Xpert Xpress SARS-CoV-2/FLU/RSV plus assay is intended as an aid in the diagnosis of influenza from Nasopharyngeal swab specimens and should not be used as a sole basis for treatment. Nasal washings and aspirates are unacceptable for Xpert Xpress SARS-CoV-2/FLU/RSV testing.  Fact Sheet for Patients: BloggerCourse.com  Fact Sheet for Healthcare Providers: SeriousBroker.it  This test is not yet approved or cleared by the [place] FDA and has been authorized for detection and/or diagnosis of SARS-CoV-2 by FDA under an Emergency Use Authorization (EUA). This EUA will remain in effect (meaning this test can be used) for the duration of the COVID-19 declaration under Section 564(b)(1) of the Act, 21 U.S.C. section 360bbb-3(b)(1), unless the authorization is terminated or revoked.     Resp Syncytial Virus by PCR NEGATIVE NEGATIVE Final    Comment: (NOTE) Fact Sheet for Patients: BloggerCourse.com  Fact Sheet for Healthcare Providers: SeriousBroker.it  This test is not yet approved or cleared by the [place] FDA and has been authorized for detection and/or diagnosis of SARS-CoV-2 by FDA under an Emergency Use Authorization (EUA). This EUA will remain in effect (meaning this test can be used) for  the duration of the COVID-19 declaration under Section 564(b)(1) of the Act, 21 U.S.C. section 360bbb-3(b)(1), unless the authorization is terminated or revoked.  Performed at [facility], [address], [place], [place] [address]   MRSA Next Gen by PCR, Nasal     Status: None   Collection Time: 11/17/23  7:52 AM   Specimen: Nasal Mucosa; Nasal Swab  Result Value Ref Range Status   MRSA by PCR Next Gen NOT DETECTED NOT DETECTED Final    Comment: (NOTE) The GeneXpert MRSA Assay (FDA approved for NASAL specimens only), is one component of a comprehensive MRSA colonization surveillance program. It is not intended to diagnose MRSA infection nor to guide or monitor treatment for MRSA infections. Test performance is not FDA approved in patients less than 58 years old. Performed at [facility], [address] [place], [place] [address]      Labs: Basic Metabolic Panel: Recent Labs  Lab 11/16/23 [phone] 11/16/23 1003 11/17/23 0559 11/18/23 0533  11/19/23 0446 11/20/23 0528  NA 132*  --  135 134* 140 133*  K 3.7  --  4.4 3.8 3.5 3.9  CL 99  --  106 106 110 104  CO2 20*  --  20* 21* 21* 24  GLUCOSE 94  --  124* 101* 87 96  BUN 28*  --  22 76* 28* 10  CREATININE 1.18*  --  0.71 0.80 0.57 0.74  CALCIUM 8.9  --  8.5* 7.9* 7.7* 7.9*  MG  --  1.8  --  2.5* 2.1 2.2  PHOS  --  2.8  --  1.8* 3.2 3.2   Liver Function Tests: Recent Labs  Lab 11/16/23 0724 11/17/23 0559 11/18/23 0533 11/19/23 0446 11/20/23 0528  AST 29 26 13* 9* 10*  ALT 29 8 18 14 14   ALKPHOS 104 85 54 42 54  BILITOT 1.2 0.6 0.4 0.7 0.7  PROT 7.7 6.4* 4.8* 4.2* 5.2*  ALBUMIN 3.5 2.6* 2.0* 2.1* 2.4*   CBC: Recent Labs  Lab 11/16/23 0724 11/17/23 0559 11/18/23 0742 11/18/23 0938 11/18/23 2113 11/19/23 0446 11/19/23 1751 11/20/23 0528 11/21/23 0110  WBC 20.5* 17.8* 13.7*  --   --  14.0*  --  19.8*  --   NEUTROABS 17.7* 16.4* 9.9*  --   --  6.2  --  8.6*  --    HGB 13.4 11.0* 6.2*   < > 7.8* 7.3* 7.4* 7.3* 8.7*  HCT 40.0 32.9* 19.6*   < > 22.8* 21.5* 21.8* 21.9* 25.5*  MCV 84.9 85.0 89.5  --   --  84.3  --  85.2  --   PLT 303 263 215  --   --  198  --  260  --    < > = values in this interval not displayed.   CBG: Recent Labs  Lab 11/17/23 1201 11/17/23 1700 11/17/23 2125 11/18/23 1156 11/19/23 0920  GLUCAP 117* 112* 102* 100* 88   Hgb A1c No results for input(s): "HGBA1C" in the last 72 hours. Lipid Profile No results for input(s): "CHOL", "HDL", "LDLCALC", "TRIG", "CHOLHDL", "LDLDIRECT" in the last 72 hours. Thyroid function studies No results for input(s): "TSH", "T4TOTAL", "T3FREE", "THYROIDAB" in the last 72 hours.  Invalid input(s): "FREET3" Urinalysis    Component Value Date/Time   COLORURINE YELLOW 11/26/2018 0025   APPEARANCEUR CLEAR 11/26/2018 0025   LABSPEC 1.025 08/16/2019 1404   PHURINE 6.0 08/16/2019 1404   GLUCOSEU NEGATIVE 08/16/2019 1404   HGBUR MODERATE (A) 08/16/2019 1404   HGBUR small 01/21/2010 1410   BILIRUBINUR NEGATIVE 08/16/2019 1404   BILIRUBINUR negative 01/04/2016 0905   KETONESUR NEGATIVE 08/16/2019 1404   PROTEINUR NEGATIVE 08/16/2019 1404   UROBILINOGEN 0.2 08/16/2019 1404   NITRITE NEGATIVE 08/16/2019 1404   LEUKOCYTESUR NEGATIVE 08/16/2019 1404    FURTHER DISCHARGE INSTRUCTIONS:   Get Medicines reviewed and adjusted: Please take all your medications with you for your next visit with your Primary MD   Laboratory/radiological data: Please request your Primary MD to go over all hospital tests and procedure/radiological results at the follow up, please ask your Primary MD to get all Hospital records sent to his/her office.   In some cases, they will be blood work, cultures and biopsy results pending at the time of your discharge. Please request that your primary care M.D. goes through all the records of your hospital data and follows up on these results.   Also Note the following: If you  experience worsening of your admission symptoms, develop  shortness of breath, life threatening emergency, suicidal or homicidal thoughts you must seek medical attention immediately by calling 911 or calling your MD immediately  if symptoms less severe.   You must read complete instructions/literature along with all the possible adverse reactions/side effects for all the Medicines you take and that have been prescribed to you. Take any new Medicines after you have completely understood and accpet all the possible adverse reactions/side effects.    Do not drive when taking Pain medications or sleeping medications (Benzodaizepines)   Do not take more than prescribed Pain, Sleep and Anxiety Medications. It is not advisable to combine anxiety,sleep and pain medications without talking with your primary care practitioner   Special Instructions: If you have smoked or chewed Tobacco  in the last 2 yrs please stop smoking, stop any regular Alcohol  and or any Recreational drug use.   Wear Seat belts while driving.   Please note: You were cared for by a hospitalist during your hospital stay. Once you are discharged, your primary care physician will handle any further medical issues. Please note that NO REFILLS for any discharge medications will be authorized once you are discharged, as it is imperative that you return to your primary care physician (or establish a relationship with a primary care physician if you do not have one) for your post hospital discharge needs so that they can reassess your need for medications and monitor your lab values.  Time coordinating discharge: 35 minutes  SIGNED:  Pamella Pert, MD, PhD 11/21/2023, 9:48 AM

## 2023-11-21 NOTE — TOC Transition Note (Signed)
Transition of Care [facility]) - Discharge Note   Patient Details  Name: Lydia Griffin MRN: [id number] Date of Birth: 01/25/1961  [facility] CM/SW Contact:  Lanier Clam, RN Phone Number: 11/21/2023, 10:01 AM   Clinical Narrative:  Noted on ra. No 02 needed.d/c home no needs or orders.     Final next level of care: Home/Self Care Barriers to Discharge: No Barriers Identified   Patient Goals and CMS Choice            Discharge Placement                       Discharge Plan and Services Additional resources added to the After Visit Summary for                                       Social Drivers of Health (SDOH) Interventions SDOH Screenings   Food Insecurity: No Food Insecurity (11/16/2023)  Housing: Low Risk  (11/16/2023)  Transportation Needs: No Transportation Needs (11/16/2023)  Utilities: Not At Risk (11/16/2023)  Depression (PHQ2-9): Low Risk  (10/22/2023)  Tobacco Use: Low Risk  (11/19/2023)     Readmission Risk Interventions     No data to display

## 2023-11-22 ENCOUNTER — Telehealth: Payer: Self-pay

## 2023-11-22 ENCOUNTER — Other Ambulatory Visit (HOSPITAL_COMMUNITY): Payer: Self-pay

## 2023-11-22 NOTE — Transitions of Care (Post Inpatient/ED Visit) (Signed)
11/22/2023  Name: Lydia Griffin MRN: [id number] DOB: 1961-03-24  Today's TOC FU Call Status: Today's TOC FU Call Status:: Successful TOC FU Call Completed TOC FU Call Complete Date: 11/22/23 Patient's Name and Date of Birth confirmed.  Transition Care Management Follow-up Telephone Call Date of Discharge: 11/21/23 Discharge Facility: [facility]) Type of Discharge: Inpatient Admission Primary Inpatient Discharge Diagnosis:: Multifocal Pneumonia, Hypoxia How have you been since you were released from the hospital?: Better Any questions or concerns?: No  Items Reviewed: Did you receive and understand the discharge instructions provided?: Yes Medications obtained,verified, and reconciled?: Yes (Medications Reviewed) Any new allergies since your discharge?: No Dietary orders reviewed?: Yes Type of Diet Ordered:: Low sodium, heart healthy Do you have support at home?: Yes People in Home: sibling(s) Name of Support/Comfort Primary Source: Sue Lush  Medications Reviewed Today: Medications Reviewed Today     Reviewed by Jodelle Gross, RN (Case Manager) on 11/22/23 at 0919  Med List Status: <None>   Medication Order Taking? Sig Documenting Provider Last Dose Status Informant  albuterol (VENTOLIN HFA) 108 (90 Base) MCG/ACT inhaler [id number] Yes Inhale 2 puffs into the lungs every 6 (six) hours as needed for wheezing or shortness of breath. Westley Chandler, MD Taking Active Self, Pharmacy Records  albuterol (VENTOLIN HFA) 108 [phone] Base) MCG/ACT inhaler [id number]  Inhale 2 puffs into the lungs every 6 (six) hours as needed for wheezing or shortness of breath. Bess Kinds, MD  Active Self, Pharmacy Records           Med Note (CRUTHIS, CHLOE C   Fri Nov 16, 2023  1:58 PM) Unable to remove entries without DC refills   amoxicillin (AMOXIL) 500 MG capsule [id number] No Take 2 capsules (1,000 mg total) by mouth 3 (three) times daily for 3 days.  Patient not taking: Reported on 11/22/2023    Leatha Gilding, MD Not Taking Active            Med Note Electa Sniff, [facility]   Thu Nov 22, 2023  9:18 AM) Patient picking up today  ferrous sulfate 325 (65 FE) MG tablet [id number] No Take 1 tablet (325 mg total) by mouth every other day.  Patient not taking: Reported on 11/22/2023   Leatha Gilding, MD Not Taking Active            Med Note Electa Sniff, [facility]   Thu Nov 22, 2023  9:18 AM) Patient to pick up today  Fluticasone-Umeclidin-Vilant (TRELEGY ELLIPTA) 100-62.5-25 MCG/ACT AEPB [id number] Yes Inhale 1 puff into the lungs daily.  Patient taking differently: Inhale 1 puff into the lungs in the morning and at bedtime.   Bess Kinds, MD Taking Active Self, Pharmacy Records           Med Note (CRUTHIS, CHLOE C   Fri Nov 16, 2023  1:58 PM) Pt is adamant she is taking this medication BID. No fill hx found.   ipratropium-albuterol (DUONEB) 0.5-2.5 (3) MG/3ML SOLN [id number] Yes USE 1 VIAL VIA NEBULIZER EVERY 4 HOURS AS NEEDED  Patient taking differently: Take 3 mLs by nebulization every 4 (four) hours as needed (shortness of breath).   Cora Collum, DO Taking Active Self, Pharmacy Records    Discontinued 04/26/20 1625   montelukast (SINGULAIR) 10 MG tablet [id number] Yes Take 1 tablet (10 mg total) by mouth at bedtime.  Patient taking differently: Take 10 mg by mouth daily.   Jerre Simon, MD Taking Active Self, Pharmacy Records  Med Note (CRUTHIS, CHLOE C   Fri Nov 16, 2023  1:59 PM) Pt is adamant she is taking this medication daily dispense report does not support this claim.   Multiple Vitamins-Minerals (MULTIVITAMIN WITH MINERALS) tablet [id number] Yes Take 1 tablet by mouth daily. [provider] Taking Active Self, Pharmacy Records  pantoprazole (PROTONIX) 40 MG tablet [id number] No Take 1 tablet (40 mg total) by mouth 2 (two) times daily for 14 days, THEN 1 tablet (40 mg total) daily. Follow up with GI outpatient for further instructions.  Patient not taking:  Reported on 11/22/2023   Leatha Gilding, MD Not Taking Active            Med Note Electa Sniff, [facility]   Thu Nov 22, 2023  9:19 AM) Patient to pick up today            Home Care and Equipment/Supplies: Were Home Health Services Ordered?: No Any new equipment or medical supplies ordered?: No  Functional Questionnaire: Do you need assistance with bathing/showering or dressing?: No Do you need assistance with meal preparation?: No Do you need assistance with eating?: No Do you have difficulty maintaining continence: No Do you need assistance with getting out of bed/getting out of a chair/moving?: No Do you have difficulty managing or taking your medications?: No  Follow up appointments reviewed: PCP Follow-up appointment confirmed?: No MD Provider Line Number:[phone] Given: No (Patient plans to call today to schedule) Specialist Hospital Follow-up appointment confirmed?: NA Do you need transportation to your follow-up appointment?: No Do you understand care options if your condition(s) worsen?: Yes-patient verbalized understanding  SDOH Interventions Today    Flowsheet Row Most Recent Value  SDOH Interventions   Food Insecurity Interventions Intervention Not Indicated  Housing Interventions Intervention Not Indicated  Transportation Interventions Intervention Not Indicated  Utilities Interventions Intervention Not Indicated      Jodelle Gross RN, BSN, CCM [place]  [facility] Manager Population Health Direct Dial: [phone]  Fax: [phone]

## 2023-11-27 ENCOUNTER — Ambulatory Visit (INDEPENDENT_AMBULATORY_CARE_PROVIDER_SITE_OTHER): Payer: 59 | Admitting: Student

## 2023-11-27 ENCOUNTER — Other Ambulatory Visit (HOSPITAL_COMMUNITY): Payer: Self-pay

## 2023-11-27 ENCOUNTER — Encounter: Payer: Self-pay | Admitting: Student

## 2023-11-27 VITALS — BP 130/70 | HR 67 | Ht 65.0 in | Wt 179.0 lb

## 2023-11-27 DIAGNOSIS — A048 Other specified bacterial intestinal infections: Secondary | ICD-10-CM | POA: Diagnosis not present

## 2023-11-27 DIAGNOSIS — Z09 Encounter for follow-up examination after completed treatment for conditions other than malignant neoplasm: Secondary | ICD-10-CM | POA: Diagnosis not present

## 2023-11-27 DIAGNOSIS — D62 Acute posthemorrhagic anemia: Secondary | ICD-10-CM | POA: Insufficient documentation

## 2023-11-27 HISTORY — DX: Acute posthemorrhagic anemia: D62

## 2023-11-27 MED ORDER — BIS SUBCIT-METRONID-TETRACYC 140-125-125 MG PO CAPS
3.0000 | ORAL_CAPSULE | Freq: Four times a day (QID) | ORAL | 0 refills | Status: DC
  Filled 2023-11-27: qty 120, 10d supply, fill #0
  Filled 2023-11-27: qty 240, 20d supply, fill #0

## 2023-11-27 NOTE — Assessment & Plan Note (Addendum)
 Patient recently hospitalized for acute blood loss anemia secondary to GI bleed related to peptic ulcers.  Patient had EGD, ulcers biopsied, positive for H. pylori.  Patient treated in hospital with 3 units packed red blood cells.  Will recheck hemoglobin today. - CBC

## 2023-11-27 NOTE — Assessment & Plan Note (Addendum)
Patient comes in for hospital follow-up.  Patient appreciates that she is feeling better, breathing is improved.  Plan to recheck labs today. - BMP, CBC

## 2023-11-27 NOTE — Assessment & Plan Note (Addendum)
 Patient noted to have H. Pylori on EGD duodenal biopsy, will plan to treat w/ quadruple therapy x 14 days, with test of cure in 1 month.  -Quadruple therapy (bismuth , metronidazole , tetracycycline, protonix ) x 14 days - F/u 1 month after treatment, off of protonix  x 1 week - Protonix  BID x 14 days, then daily but to d/c 1 -2 week out from test of cure.

## 2023-11-27 NOTE — Progress Notes (Signed)
  SUBJECTIVE:   CHIEF COMPLAINT / HPI:   Hospital f/u -1/24-1/29 for multifocal PNA > Amoxicillin , and Upper GI Bleed > transfused 3 units PRBC > Protonix  BID x 14 days - EGD Bx showed H. Pylori  Today: Doing well today and feeling better. Breathing improved. Continues to have rib pain from coughing so much previously. Slightly tender when she takes a deep breath.   PERTINENT  PMH / PSH:    OBJECTIVE:  BP 130/70   Pulse 67   Ht 5' 5 (1.651 m)   Wt 179 lb (81.2 kg)   SpO2 99%   BMI 29.79 kg/m  Physical Exam Constitutional:      General: She is not in acute distress.    Appearance: Normal appearance. She is not ill-appearing.  Cardiovascular:     Rate and Rhythm: Normal rate and regular rhythm.     Pulses: Normal pulses.     Heart sounds: Normal heart sounds. No murmur heard.    No friction rub. No gallop.  Pulmonary:     Effort: Pulmonary effort is normal. No accessory muscle usage, prolonged expiration or respiratory distress.     Breath sounds: Normal breath sounds. No stridor. No wheezing, rhonchi or rales.     Comments: TTP overlying lower left ribs Chest:     Chest wall: Tenderness present.  Neurological:     Mental Status: She is alert.      ASSESSMENT/PLAN:   Assessment & Plan Acute blood loss anemia Patient recently hospitalized for acute blood loss anemia secondary to GI bleed related to peptic ulcers.  Patient had EGD, ulcers biopsied, positive for H. pylori.  Patient treated in hospital with 3 units packed red blood cells.  Will recheck hemoglobin today. [facility] discharge follow-up Patient comes in for hospital follow-up.  Patient appreciates that she is feeling better, breathing is improved.  Plan to recheck labs today. - BMP, CBC H. pylori infection Patient noted to have H. Pylori on EGD duodenal biopsy, will plan to treat w/ quadruple therapy x 14 days, with test of cure in 1 month.  -Quadruple therapy (bismuth , metronidazole , tetracycycline,  protonix ) x 14 days - F/u 1 month after treatment, off of protonix  x 1 week - Protonix  BID x 14 days, then daily but to d/c 1 -2 week out from test of cure.   No follow-ups on file. Penne Rhein, MD 11/27/2023, 4:47 PM PGY-3, [facility]

## 2023-11-27 NOTE — Patient Instructions (Addendum)
It was great to see you! Thank you for allowing me to participate in your care!  I recommend that you always bring your medications to each appointment as this makes it easy to ensure we are on the correct medications and helps [place] not miss when refills are needed.  Our plans for today:  - Hospital Follow up  Obtaining some lab work: BMP and CBC  - H. Pylorin Infection You have a bacterial infection that caused your stomach ulcers. We will need to treat you for 2 weeks.  Take 3 capsules, (bismuth, metronidazole, tetracycline) four times a day (breakfast, lunch, dinner, before bed), x 14 days  Take Protonix twice a day x 14 days, then take daily  Stop Protonix 1 week before you return to clinic for test of cure  Test of cure 1 month after treatment completion  We are checking some labs today, I will call you if they are abnormal will send you a MyChart message or a letter if they are normal.  If you do not hear about your labs in the next 2 weeks please let us know.  Take care and seek immediate care sooner if you develop any concerns.   Dr. Bess Kinds, MD [facility]

## 2023-11-28 ENCOUNTER — Telehealth: Payer: Self-pay

## 2023-11-28 ENCOUNTER — Other Ambulatory Visit (HOSPITAL_COMMUNITY): Payer: Self-pay

## 2023-11-28 LAB — CBC
Hematocrit: 33.6 % — ABNORMAL LOW (ref 34.0–46.6)
Hemoglobin: 11.2 g/dL (ref 11.1–15.9)
MCH: 29.1 pg (ref 26.6–33.0)
MCHC: 33.3 g/dL (ref 31.5–35.7)
MCV: 87 fL (ref 79–97)
Platelets: 725 10*3/uL — ABNORMAL HIGH (ref 150–450)
RBC: 3.85 x10E6/uL (ref 3.77–5.28)
RDW: 13.6 % (ref 11.7–15.4)
WBC: 6.5 10*3/uL (ref 3.4–10.8)

## 2023-11-28 LAB — BASIC METABOLIC PANEL
BUN/Creatinine Ratio: 11 — ABNORMAL LOW (ref 12–28)
BUN: 8 mg/dL (ref 8–27)
CO2: 23 mmol/L (ref 20–29)
Calcium: 9.1 mg/dL (ref 8.7–10.3)
Chloride: 101 mmol/L (ref 96–106)
Creatinine, Ser: 0.71 mg/dL (ref 0.57–1.00)
Glucose: 83 mg/dL (ref 70–99)
Potassium: 4.7 mmol/L (ref 3.5–5.2)
Sodium: 138 mmol/L (ref 134–144)
eGFR: 95 mL/min/{1.73_m2} (ref >59)

## 2023-11-28 NOTE — Telephone Encounter (Signed)
Called patient and discussed with them

## 2023-11-28 NOTE — Telephone Encounter (Signed)
 Patients presents to [facility] in regards to Pylera .  She reports this is not covered by her insurance.   Called [facility] and medication is needing a PA. Will submit.  Patient has some questions for provider while waiting for medication approval.   She would like to know if she should start Protonix  or wait until she starts Pylera .  She also wants to know how contagious she is and what precautions she should take.   Advised will forward to provider who saw patient.

## 2023-11-29 ENCOUNTER — Telehealth: Payer: Self-pay

## 2023-11-29 NOTE — Telephone Encounter (Signed)
 Patient returns call to nurse line.  She reports that last bowel movement was 2-3 days ago.  Feels like bowel movements are little strings.  Denies blood in stool. Reports small amount of bright red blood after wiping, with small amount in toilet. She is unsure if she has any hemorrhoids.   Reports that stool has been soft and normal.   She states that she has never had blood after bowel movements before. Due to this concern, scheduled patient for follow up evaluation tomorrow afternoon.   Strict ED precautions discussed.   Chiquita JAYSON English, RN

## 2023-11-29 NOTE — Telephone Encounter (Signed)
 Patient calls nurse line, LVM asking if she could take laxative.   Patient recently diagnosed with H.Pylori.   Attempted to call patient back to discuss request further.   She did not answer, LVM asking for her to return call to office.   Chiquita JAYSON English, RN

## 2023-11-30 ENCOUNTER — Encounter: Payer: Self-pay | Admitting: Student

## 2023-11-30 ENCOUNTER — Ambulatory Visit (INDEPENDENT_AMBULATORY_CARE_PROVIDER_SITE_OTHER): Payer: 59 | Admitting: Student

## 2023-11-30 VITALS — BP 125/75 | HR 71 | Temp 98.0°F | Wt 180.0 lb

## 2023-11-30 DIAGNOSIS — K625 Hemorrhage of anus and rectum: Secondary | ICD-10-CM

## 2023-11-30 LAB — POCT HEMOGLOBIN: Hemoglobin: 11.6 g/dL (ref 11–14.6)

## 2023-11-30 NOTE — Telephone Encounter (Signed)
 Pylera  was denied by patients insurance.   Please send in all 3 medications separately for coverage.

## 2023-11-30 NOTE — Progress Notes (Signed)
  SUBJECTIVE:   CHIEF COMPLAINT / HPI:   Bleeding after bowel movement: Called nurse line yesterday reporting small amount of bright red blood after wiping.  She was hospitalized in late January for acute blood loss anemia secondary to peptic ulcer disease with consequent diagnosis of H. pylori. She states when she has BM, there is blood behind it. Bright red blood when she wipes. Denies pain when stooling. She has to strain some when she poops. She was constipated for about a week.  Last noticed blood when she had stool this morning.  She has not yet started her medication for H. pylori.  She states she has never had a colonoscopy because in the past her insurance would decline it however now she has Community Education Officer.  PERTINENT  PMH / PSH: COPD, HTN, GERD  OBJECTIVE:  BP 125/75   Pulse 71   Temp 98 F (36.7 C)   Wt 180 lb (81.6 kg)   SpO2 99%   BMI 29.95 kg/m  General: Well-appearing, NAD CV: RRR, murmurs auscultated Pulm: CTAB, normal WOB Abdomen soft, nontender, normoactive bowel sounds GU: Rectal exam unremarkable, no appreciable bleeding or fissures/hemorrhoids  ASSESSMENT/PLAN:   Assessment & Plan Rectal bleeding Hemoglobin stable at 11.6.  Suspect this is related to constipation, recommended MiraLAX  daily and calling GI to follow-up for colonoscopy which she has never had.  Considered anal fissure and hemorrhoids however none visualized.  Discussed ED precautions should she experience significant bleeding. Return if symptoms worsen or fail to improve. Kieth Johnson, DO 11/30/2023, 4:29 PM PGY-3, [facility]

## 2023-11-30 NOTE — Patient Instructions (Addendum)
 It was great to see you today! Thank you for choosing [facility] for your primary care.  Today we addressed: I will check your hemoglobin today.  I suspect the bleeding and pain you are having is related to constipation.  I would recommend taking MiraLAX  once a daily to soften your stool.  This does take a couple days to start working.  I recommend you call your GI group to see if your appointment could be sooner to consider colonoscopy.  If you haven't already, sign up for My Chart to have easy access to your labs results, and communication with your primary care physician. We are checking some labs today. If they are abnormal, I will call you. If they are normal, I will send you a MyChart message (if it is active) or a letter in the mail. If you do not hear about your labs in the next 2 weeks, please call the office. Return if symptoms worsen or fail to improve. Please arrive 15 minutes before your appointment to ensure smooth check in process.  We appreciate your efforts in making this happen.  Thank you for allowing me to participate in your care, Kieth Johnson, DO 11/30/2023, 3:47 PM PGY-3, [facility]

## 2023-12-01 MED ORDER — METRONIDAZOLE 500 MG PO TABS
500.0000 mg | ORAL_TABLET | Freq: Four times a day (QID) | ORAL | 0 refills | Status: AC
  Filled 2023-12-01 – 2023-12-03 (×2): qty 56, 14d supply, fill #0

## 2023-12-01 MED ORDER — BISMUTH SUBSALICYLATE 262 MG PO TABS
262.0000 mg | ORAL_TABLET | Freq: Four times a day (QID) | ORAL | 0 refills | Status: AC
  Filled 2023-12-01: qty 56, 14d supply, fill #0

## 2023-12-01 MED ORDER — TETRACYCLINE HCL 500 MG PO CAPS
500.0000 mg | ORAL_CAPSULE | Freq: Four times a day (QID) | ORAL | 0 refills | Status: AC
  Filled 2023-12-01: qty 56, 14d supply, fill #0
  Filled 2023-12-03: qty 12, 3d supply, fill #0
  Filled 2023-12-03: qty 44, 11d supply, fill #0

## 2023-12-01 NOTE — Telephone Encounter (Signed)
 Rx for Bismuth , Tetracycline , and metronidazole  sent to be taken 4 times a day, for 14 days. Patient to also take PPI twice a day with these meds.   Patient to wait entire month after completion of abx, for test of cure. Patient to be off PPI x 1 week before test of cure.

## 2023-12-03 ENCOUNTER — Other Ambulatory Visit (HOSPITAL_COMMUNITY): Payer: Self-pay

## 2023-12-04 ENCOUNTER — Other Ambulatory Visit: Payer: Self-pay

## 2023-12-04 ENCOUNTER — Other Ambulatory Visit (HOSPITAL_COMMUNITY): Payer: Self-pay

## 2023-12-05 ENCOUNTER — Other Ambulatory Visit: Payer: Self-pay | Admitting: Student

## 2023-12-05 DIAGNOSIS — R7989 Other specified abnormal findings of blood chemistry: Secondary | ICD-10-CM

## 2023-12-06 NOTE — Progress Notes (Signed)
Pt w/ elevated plt, needing repeat CBC and hopefully rflx blood smear

## 2023-12-07 ENCOUNTER — Other Ambulatory Visit: Payer: No Typology Code available for payment source

## 2023-12-07 DIAGNOSIS — R7989 Other specified abnormal findings of blood chemistry: Secondary | ICD-10-CM

## 2023-12-08 LAB — CBC WITH DIFFERENTIAL/PLATELET
Basophils Absolute: 0.1 10*3/uL (ref 0.0–0.2)
Basos: 1 %
EOS (ABSOLUTE): 0.6 10*3/uL — ABNORMAL HIGH (ref 0.0–0.4)
Eos: 7 %
Hematocrit: 36.7 % (ref 34.0–46.6)
Hemoglobin: 11.9 g/dL (ref 11.1–15.9)
Immature Grans (Abs): 0 10*3/uL (ref 0.0–0.1)
Immature Granulocytes: 0 %
Lymphocytes Absolute: 3.5 10*3/uL — ABNORMAL HIGH (ref 0.7–3.1)
Lymphs: 43 %
MCH: 28.6 pg (ref 26.6–33.0)
MCHC: 32.4 g/dL (ref 31.5–35.7)
MCV: 88 fL (ref 79–97)
Monocytes Absolute: 0.8 10*3/uL (ref 0.1–0.9)
Monocytes: 10 %
Neutrophils Absolute: 3.2 10*3/uL (ref 1.4–7.0)
Neutrophils: 39 %
Platelets: 537 10*3/uL — ABNORMAL HIGH (ref 150–450)
RBC: 4.16 x10E6/uL (ref 3.77–5.28)
RDW: 13.2 % (ref 11.7–15.4)
WBC: 8.2 10*3/uL (ref 3.4–10.8)

## 2023-12-13 ENCOUNTER — Telehealth: Payer: Self-pay | Admitting: Student

## 2023-12-13 DIAGNOSIS — R7989 Other specified abnormal findings of blood chemistry: Secondary | ICD-10-CM

## 2023-12-13 NOTE — Telephone Encounter (Signed)
 Called to discuss results of blood work with patient.   Patient's blood work shows that her Platelet count is coming down. It was 700, now its around 500. We are going to retest your platelets in another 2 weeks, to be sure that they are trending down/resolved.   If they do not go down, or increase, we will be sending you to Hematology Oncology for further investigation.  Patient should make a lab appointment in 2 weeks

## 2023-12-14 NOTE — Telephone Encounter (Signed)
 Patient returns call to nurse line.   Discussed lab results with patient and plan.   Patient scheduled for lab visit for 3/7.

## 2023-12-28 ENCOUNTER — Other Ambulatory Visit: Payer: No Typology Code available for payment source

## 2023-12-28 DIAGNOSIS — R7989 Other specified abnormal findings of blood chemistry: Secondary | ICD-10-CM

## 2023-12-29 LAB — CBC WITH DIFFERENTIAL/PLATELET
Basophils Absolute: 0.1 10*3/uL (ref 0.0–0.2)
Basos: 1 %
EOS (ABSOLUTE): 0.6 10*3/uL — ABNORMAL HIGH (ref 0.0–0.4)
Eos: 8 %
Hematocrit: 38.6 % (ref 34.0–46.6)
Hemoglobin: 12.1 g/dL (ref 11.1–15.9)
Immature Grans (Abs): 0 10*3/uL (ref 0.0–0.1)
Immature Granulocytes: 0 %
Lymphocytes Absolute: 2.4 10*3/uL (ref 0.7–3.1)
Lymphs: 30 %
MCH: 27.3 pg (ref 26.6–33.0)
MCHC: 31.3 g/dL — ABNORMAL LOW (ref 31.5–35.7)
MCV: 87 fL (ref 79–97)
Monocytes Absolute: 0.7 10*3/uL (ref 0.1–0.9)
Monocytes: 9 %
Neutrophils Absolute: 4.1 10*3/uL (ref 1.4–7.0)
Neutrophils: 52 %
Platelets: 437 10*3/uL (ref 150–450)
RBC: 4.44 x10E6/uL (ref 3.77–5.28)
RDW: 12.7 % (ref 11.7–15.4)
WBC: 7.9 10*3/uL (ref 3.4–10.8)

## 2024-01-01 ENCOUNTER — Other Ambulatory Visit (HOSPITAL_COMMUNITY): Payer: Self-pay

## 2024-01-02 ENCOUNTER — Other Ambulatory Visit (HOSPITAL_COMMUNITY): Payer: Self-pay

## 2024-01-02 ENCOUNTER — Telehealth: Payer: Self-pay | Admitting: Student

## 2024-01-02 ENCOUNTER — Encounter: Payer: Self-pay | Admitting: Student

## 2024-01-02 NOTE — Telephone Encounter (Signed)
 Called and spoke with patient about trellergy Rx.   Patient notes she used to get it directly from the manufacture, after filling out some paperwork. She said she hasn't had to renew it in a little while and it likely needs to be redone.   She notes that she talked to her insurance about the cost of Rx, and it's still around 400-700$ for her to pay out of pocket.   I dicussed the 35$ copay card that the pharmacist Siri Cole had made me aware of in a staff message.   I sent the patient a myChart message with the link to the website.   This provider will message pharmacy team to see about getting Rx.

## 2024-01-04 ENCOUNTER — Encounter: Payer: Self-pay | Admitting: Pharmacist

## 2024-01-18 ENCOUNTER — Telehealth: Payer: Self-pay | Admitting: Pharmacist

## 2024-01-18 NOTE — Telephone Encounter (Signed)
 Patient contacted for follow-up of RETURNED my chart message noting patient had not seen my communication for use of Co-Pay card  With website voucher.   Trelegy has the $35 copay card available online.    [email or web address]  She was instructed to take this to herr pharmacy.   She may need a new prescription which I am happy to assist with if requested early next week.    She plans to see Dr. Barbaraann Faster 02/04/2024  Total time with patient call and documentation of interaction: 11 minutes.  F/U Phone call planned: 1 week to determine status

## 2024-01-21 NOTE — Telephone Encounter (Signed)
 Reviewed and agree with Dr Macky Lower plan.

## 2024-01-24 ENCOUNTER — Telehealth: Payer: Self-pay | Admitting: Pharmacist

## 2024-01-24 NOTE — Telephone Encounter (Signed)
 Contacted patient in follow-up of Trelegy Co-Pay Card - $35  Patient reports she has activated card in-hand.  She plans to go to her pharmacy tomorrow to pick-up supply of Trelegy and restart 1 inhalation daily.  Patient has taken in the past and reports improved breathing with use.    Appears she has active prescription at her pharmacy.   Asked her to contact our office with any additional questions or need for support to restart Trelegy within the next few days.   No additional follow-up planned.

## 2024-01-25 NOTE — Telephone Encounter (Signed)
 Reviewed and agree with Dr Macky Lower plan.

## 2024-01-28 ENCOUNTER — Encounter (HOSPITAL_COMMUNITY): Payer: Self-pay

## 2024-01-28 ENCOUNTER — Other Ambulatory Visit (HOSPITAL_COMMUNITY): Payer: Self-pay

## 2024-01-28 ENCOUNTER — Ambulatory Visit (INDEPENDENT_AMBULATORY_CARE_PROVIDER_SITE_OTHER): Payer: Self-pay | Admitting: Family Medicine

## 2024-01-28 ENCOUNTER — Encounter: Payer: Self-pay | Admitting: Family Medicine

## 2024-01-28 VITALS — BP 126/76 | HR 63 | Ht 65.0 in | Wt 189.8 lb

## 2024-01-28 DIAGNOSIS — J4541 Moderate persistent asthma with (acute) exacerbation: Secondary | ICD-10-CM

## 2024-01-28 MED ORDER — IPRATROPIUM-ALBUTEROL 0.5-2.5 (3) MG/3ML IN SOLN
3.0000 mL | RESPIRATORY_TRACT | 0 refills | Status: DC | PRN
  Filled 2024-01-28: qty 270, 15d supply, fill #0

## 2024-01-28 MED ORDER — TRELEGY ELLIPTA 100-62.5-25 MCG/ACT IN AEPB
1.0000 | INHALATION_SPRAY | Freq: Every day | RESPIRATORY_TRACT | 0 refills | Status: DC
  Filled 2024-01-28: qty 60, 60d supply, fill #0

## 2024-01-28 MED ORDER — IPRATROPIUM BROMIDE 0.02 % IN SOLN
0.5000 mg | Freq: Once | RESPIRATORY_TRACT | Status: AC
  Administered 2024-01-28: 0.5 mg via RESPIRATORY_TRACT

## 2024-01-28 MED ORDER — PREDNISONE 10 MG PO TABS
40.0000 mg | ORAL_TABLET | Freq: Every day | ORAL | 0 refills | Status: AC
Start: 2024-01-28 — End: 2024-02-02
  Filled 2024-01-28: qty 20, 5d supply, fill #0

## 2024-01-28 MED ORDER — ALBUTEROL SULFATE HFA 108 (90 BASE) MCG/ACT IN AERS
2.0000 | INHALATION_SPRAY | Freq: Four times a day (QID) | RESPIRATORY_TRACT | 5 refills | Status: AC | PRN
  Filled 2024-01-28: qty 6.7, 25d supply, fill #0
  Filled 2024-03-21: qty 6.7, 25d supply, fill #1

## 2024-01-28 MED ORDER — ALBUTEROL SULFATE (2.5 MG/3ML) 0.083% IN NEBU
2.5000 mg | INHALATION_SOLUTION | Freq: Once | RESPIRATORY_TRACT | Status: AC
  Administered 2024-01-28: 2.5 mg via RESPIRATORY_TRACT

## 2024-01-28 NOTE — Assessment & Plan Note (Signed)
 Mild acute asthma exacerbation likely secondary to seasonal allergies.  No obvious signs of respiratory infection. -Prednisone 40 mg daily for 5 days -Refill Trelegy -Scheduled DuoNebs for the next 48 hours -As needed additional albuterol inhaler -DuoNeb treatment today in clinic-return precautions discussed for dyspnea or worsening asthma exacerbation -Instructed to call if unable to obtain Trelegy secondary to cost, will send [facility] if needed

## 2024-01-28 NOTE — Progress Notes (Unsigned)
    SUBJECTIVE:   CHIEF COMPLAINT / HPI: trouble with asthma  3-4 days of asthma "attacks" Resolves with breathing treatments  Waking up in the middle of the night, takes treatment Also wheezing in morning. Worse with activity. Taking Albuterol nebs. Has been using 4 times a day. No runny nose or fever. No diarrhea or myalgia. Has had exacerbations with weather changes before. No chest pain.  Taking Singular. Taking Trelegy as well, but has been out   PERTINENT  PMH / PSH: Mixed COPD/Asthma, HTN  OBJECTIVE:   BP 126/76   Pulse 63   Ht 5\' 5"  (1.651 m)   Wt 189 lb 12.8 oz (86.1 kg)   SpO2 95%   BMI 31.58 kg/m   General: NAD, well appearing Neuro: A&O Cardiovascular: RRR, no murmurs, no peripheral edema Respiratory: normal WOB on RA, good air movement throughout, diffuse inspiratory and expiratory wheezing Extremities: Moving all 4 extremities equally   ASSESSMENT/PLAN:   Assessment & Plan Moderate persistent asthma with acute exacerbation Mild acute asthma exacerbation likely secondary to seasonal allergies.  No obvious signs of respiratory infection. -Prednisone 40 mg daily for 5 days -Refill Trelegy -Scheduled DuoNebs for the next 48 hours -As needed additional albuterol inhaler -DuoNeb treatment today in clinic-return precautions discussed for dyspnea or worsening asthma exacerbation -Instructed to call if unable to obtain Trelegy secondary to cost, will send [facility] if needed   Return if symptoms worsen or fail to improve.  Celine Mans, MD [facility]

## 2024-01-28 NOTE — Patient Instructions (Addendum)
 It was great to see you! Thank you for allowing me to participate in your care!  Our plans for today:  - Please take your Duonebs every 4 hours for the next 48hrs. - Please start taking prednisone 40mg  daily for the next 5 days. - If your symptoms do not improve in the next 48hrs please return to care. - Please go to the ED for any worsening shortness of breath. - I have sent Trelegy to your pharmacy. - If you are unable to get this, please let me know so I can send a different inhaler.   Please arrive 15 minutes PRIOR to your next scheduled appointment time! If you do not, this affects OTHER patients' care.  Take care and seek immediate care sooner if you develop any concerns.   Celine Mans, MD, PGY-2 [facility] 1:26 PM 01/28/2024  [facility]

## 2024-01-29 ENCOUNTER — Encounter: Payer: Self-pay | Admitting: Family Medicine

## 2024-02-04 ENCOUNTER — Encounter: Payer: Self-pay | Admitting: Student

## 2024-02-04 ENCOUNTER — Ambulatory Visit (INDEPENDENT_AMBULATORY_CARE_PROVIDER_SITE_OTHER): Payer: Self-pay | Admitting: Student

## 2024-02-04 VITALS — BP 141/98 | HR 75 | Ht 65.0 in | Wt 196.8 lb

## 2024-02-04 DIAGNOSIS — J4551 Severe persistent asthma with (acute) exacerbation: Secondary | ICD-10-CM

## 2024-02-04 DIAGNOSIS — A048 Other specified bacterial intestinal infections: Secondary | ICD-10-CM

## 2024-02-04 DIAGNOSIS — I10 Essential (primary) hypertension: Secondary | ICD-10-CM

## 2024-02-04 NOTE — Progress Notes (Signed)
  SUBJECTIVE:   CHIEF COMPLAINT / HPI:   H.Pylori - noted on EGD in 2/25, treated w/ quadruple thearpy Completed all medications. Off PPI at least for 2 weeks.   Asthma -Seen 01/28/24 for asthma exacerbation > Prednisone and trellergy Today Asthma is doing better. Is needing help getting medications paid for. Breathing is good, has some slight congestion in the mornings. Has been taking duonebs daily.   Insurance Issues No longer has insurance at this time, she was dropped from her last insurance, [employer], and is currently looking for insurance now.  PERTINENT  PMH / PSH:   OBJECTIVE:  BP (!) 141/98   Pulse 75   Ht 5\' 5"  (1.651 m)   Wt 196 lb 12.8 oz (89.3 kg)   SpO2 99%   BMI 32.75 kg/m  Physical Exam Cardiovascular:     Rate and Rhythm: Normal rate and regular rhythm.     Pulses: Normal pulses.     Heart sounds: Normal heart sounds. No murmur heard.    No friction rub. No gallop.  Pulmonary:     Effort: Pulmonary effort is normal. No respiratory distress.     Breath sounds: Normal breath sounds. No stridor. No wheezing, rhonchi or rales.  Chest:     Chest wall: No tenderness.      ASSESSMENT/PLAN:   Assessment & Plan H. pylori infection Patient comes in for follow-up of H. Pylori infection.  Patient reports having taken all of quadruple therapy.  Patient has been off PPI for 2 weeks, will test for cure. - Urea breath test Severe persistent asthma with acute exacerbation Patient comes in for follow-up of her asthma.  Reports symptoms have resolved, took full course of prednisone.  Patient is out of Trelegy but has been using DuoNebs daily.  Patient lost her insurance coverage, and thus can no longer use the coupon code to get Trelegy for $35.  Patient comes in with paperwork today from [employer], that is completed, will make copy and give to pharmacy team for mailing.  Patient's breath sounds normal good work of breathing, no concern for acute exacerbation at this time.  Will  recommend patient use DuoNeb daily before sleep, while allergens are high, with weather change. - DuoNeb nightly - Follow-up as needed for worsening symptoms - Will submit [employer] application for Trelegy Hypertension, unspecified type BP slightly elevated today, patient has been taking medications. Notes recent history of prednisone use. Will have patient return in 1-2 weeks for BP recheck.  -F/u 1-2 weeks for BP recheck No follow-ups on file. Wilhemena Harbour, MD 02/04/2024, 12:05 PM PGY-3, [facility]

## 2024-02-04 NOTE — Patient Instructions (Signed)
 It was great to see you! Thank you for allowing me to participate in your care!  I recommend that you always bring your medications to each appointment as this makes it easy to ensure we are on the correct medications and helps us  not miss when refills are needed.  Our plans for today:  - H. Pylori  Testing for cure  - Asthma  Breathing is good! We will work to get Programmer, multimedia.  Use duoneb at least 1 time a day, to help keep lungs open  We are checking some labs today, I will call you if they are abnormal will send you a MyChart message or a letter if they are normal.  If you do not hear about your labs in the next 2 weeks please let us  know.  Take care and seek immediate care sooner if you develop any concerns.   Dr. Wilhemena Harbour, MD [facility]

## 2024-02-04 NOTE — Assessment & Plan Note (Addendum)
 BP slightly elevated today, patient has been taking medications. Notes recent history of prednisone use. Will have patient return in 1-2 weeks for BP recheck.  -F/u 1-2 weeks for BP recheck

## 2024-02-04 NOTE — Assessment & Plan Note (Addendum)
 Patient comes in for follow-up of her asthma.  Reports symptoms have resolved, took full course of prednisone.  Patient is out of Trelegy but has been using DuoNebs daily.  Patient lost her insurance coverage, and thus can no longer use the coupon code to get Trelegy for $35.  Patient comes in with paperwork today from [employer], that is completed, will make copy and give to pharmacy team for mailing.  Patient's breath sounds normal good work of breathing, no concern for acute exacerbation at this time.  Will recommend patient use DuoNeb daily before sleep, while allergens are high, with weather change. - DuoNeb nightly - Follow-up as needed for worsening symptoms - Will submit [employer] application for Trelegy

## 2024-02-04 NOTE — Assessment & Plan Note (Addendum)
 Patient comes in for follow-up of H. Pylori infection.  Patient reports having taken all of quadruple therapy.  Patient has been off PPI for 2 weeks, will test for cure. - Urea breath test

## 2024-02-06 ENCOUNTER — Other Ambulatory Visit (HOSPITAL_COMMUNITY): Payer: Self-pay

## 2024-02-06 LAB — H. PYLORI BREATH TEST: H pylori Breath Test: NEGATIVE

## 2024-02-21 ENCOUNTER — Telehealth: Payer: Self-pay

## 2024-02-21 ENCOUNTER — Other Ambulatory Visit (HOSPITAL_COMMUNITY): Payer: Self-pay

## 2024-02-21 NOTE — Telephone Encounter (Signed)
 Patient calls nurse line requesting status update on medication assistance application through [employer] for Trelegy.   Reports that she was provided with Breztri sample at last visit, however, feels that this does not work as well.   Requesting samples of Trelegy until she is able to get update from [employer]. She states that she is not able to use Copay card as she is currently uninsured.   Patient is also requesting B12 injection. Advised that she would need to schedule an appointment to discuss this further as we do not have recent B12 labs.   Scheduled for tomorrow afternoon with Dr. Mikeal Alder.   Elsie Halo, RN

## 2024-02-22 ENCOUNTER — Ambulatory Visit: Admitting: Student

## 2024-02-26 ENCOUNTER — Telehealth: Payer: Self-pay

## 2024-02-26 NOTE — Progress Notes (Signed)
 Pharmacy Medication Assistance Program Note    02/26/2024  Patient ID: Lydia Griffin, female   DOB: June 08, 1961, 63 y.o.   MRN: [id number]     02/26/2024  Outreach Medication One  Manufacturer Medication One [employer]line (GSK)  Gl[employer]oSmith[employer]line (GSK) Drugs Trelegy  Dose of Trelegy  Type of Audiological scientist Items Received From Patient Application     RENEWAL - Application completed.   A signed hardcopy RX will need to be printed and submitted with application. Could a 90 day supply of Trelegy (with refills) be placed in my box, or faxed to me at [phone]? Thanks!

## 2024-02-26 NOTE — Telephone Encounter (Signed)
 Application rec'd.   Info in new encounter.   In the meantime, do we have samples?

## 2024-02-27 ENCOUNTER — Other Ambulatory Visit: Payer: Self-pay | Admitting: Student

## 2024-02-27 DIAGNOSIS — J4541 Moderate persistent asthma with (acute) exacerbation: Secondary | ICD-10-CM

## 2024-02-27 MED ORDER — TRELEGY ELLIPTA 100-62.5-25 MCG/ACT IN AEPB: 1.0000 | INHALATION_SPRAY | Freq: Every day | RESPIRATORY_TRACT | 3 refills | Status: DC

## 2024-02-27 NOTE — Progress Notes (Signed)
 Patient needing printed Rx for trellegy application through pharmacy.  Rx printed for 60 each w/ 3 refills and placed in camielle's box

## 2024-02-27 NOTE — Telephone Encounter (Signed)
 Signed Rx placed in your mailbox. Let me know if it's incorrect.  Thank you!  -BS

## 2024-02-28 NOTE — Telephone Encounter (Signed)
 Spoke with Dr. Koval. We do not have any samples of Trelegy. Advised that best option for patient would be Breztri.   Received verbal orders to provide patient with 2 Breztri inhalers.   Pulled samples for patient.   Called patient and advised of update. Patient will come into office to pick up samples tomorrow.   Lot: [id number] Exp Date: 07/22/2026  Elsie Halo, RN

## 2024-02-28 NOTE — Telephone Encounter (Signed)
 Patient returns call to nurse line regarding application and being out of inhaler.   She reports being very concerned as she has been out of the Trelegy for weeks now.   She has been doing the DuoNebs as prescribed and is concerned that they are going to get to the point of not working. She reports that she has episodes of wheezing daily.   She is currently speaking in complete sentences.   Advised that med assistance app is still pending. Forwarding to PCP and Dr. Koval for further assistance for patient until she is able to get shipment of Trelegy.  Elsie Halo, RN

## 2024-03-04 NOTE — Progress Notes (Signed)
 Pharmacy Medication Assistance Program Note    03/24/2024  Patient ID: Lydia Griffin, female   DOB: 1961-08-01, 63 y.o.   MRN: [id number]     02/26/2024  Outreach Medication One  Manufacturer Medication One Gl[employer]Smith[employer]Kline (GSK)  GlaxoSmithKline (GSK) Drugs Trelegy  Dose of Trelegy 100mcg  Type of Audiological scientist Items Received From Patient Application  Date Application Submitted to Manufacturer 03/04/2024  Method Application Sent to Manufacturer Fax  Patient Assistance Determination Approved  Approval End Date 03/04/2025     RENEWAL APPROVED  Med shipped 03/06/24. Will need refills.

## 2024-03-10 ENCOUNTER — Other Ambulatory Visit (HOSPITAL_COMMUNITY): Payer: Self-pay

## 2024-03-10 ENCOUNTER — Encounter: Payer: Self-pay | Admitting: Family Medicine

## 2024-03-10 ENCOUNTER — Ambulatory Visit (INDEPENDENT_AMBULATORY_CARE_PROVIDER_SITE_OTHER): Payer: Self-pay | Admitting: Family Medicine

## 2024-03-10 ENCOUNTER — Encounter (HOSPITAL_COMMUNITY): Payer: Self-pay | Admitting: Family Medicine

## 2024-03-10 ENCOUNTER — Other Ambulatory Visit: Payer: Self-pay

## 2024-03-10 ENCOUNTER — Observation Stay (HOSPITAL_COMMUNITY)

## 2024-03-10 ENCOUNTER — Observation Stay (HOSPITAL_COMMUNITY)
Admission: EM | Admit: 2024-03-10 | Discharge: 2024-03-12 | Disposition: A | Discharge status: Home / Self Care | Payer: Self-pay | Attending: Family Medicine | Admitting: Family Medicine

## 2024-03-10 ENCOUNTER — Emergency Department (HOSPITAL_COMMUNITY)

## 2024-03-10 VITALS — BP 183/88 | HR 81 | Ht 65.0 in | Wt 193.1 lb

## 2024-03-10 DIAGNOSIS — R202 Paresthesia of skin: Secondary | ICD-10-CM | POA: Diagnosis present

## 2024-03-10 DIAGNOSIS — Z7951 Long term (current) use of inhaled steroids: Secondary | ICD-10-CM

## 2024-03-10 DIAGNOSIS — R200 Anesthesia of skin: Secondary | ICD-10-CM | POA: Diagnosis present

## 2024-03-10 DIAGNOSIS — J438 Other emphysema: Secondary | ICD-10-CM | POA: Diagnosis present

## 2024-03-10 DIAGNOSIS — R9431 Abnormal electrocardiogram [ECG] [EKG]: Secondary | ICD-10-CM | POA: Diagnosis present

## 2024-03-10 DIAGNOSIS — F419 Anxiety disorder, unspecified: Secondary | ICD-10-CM | POA: Diagnosis present

## 2024-03-10 DIAGNOSIS — J45909 Unspecified asthma, uncomplicated: Secondary | ICD-10-CM | POA: Diagnosis present

## 2024-03-10 DIAGNOSIS — J449 Chronic obstructive pulmonary disease, unspecified: Principal | ICD-10-CM | POA: Insufficient documentation

## 2024-03-10 DIAGNOSIS — J4551 Severe persistent asthma with (acute) exacerbation: Principal | ICD-10-CM | POA: Diagnosis present

## 2024-03-10 DIAGNOSIS — Z6832 Body mass index (BMI) 32.0-32.9, adult: Secondary | ICD-10-CM

## 2024-03-10 DIAGNOSIS — Z8249 Family history of ischemic heart disease and other diseases of the circulatory system: Secondary | ICD-10-CM

## 2024-03-10 DIAGNOSIS — Z79899 Other long term (current) drug therapy: Secondary | ICD-10-CM

## 2024-03-10 DIAGNOSIS — Z8619 Personal history of other infectious and parasitic diseases: Secondary | ICD-10-CM

## 2024-03-10 DIAGNOSIS — Z789 Other specified health status: Secondary | ICD-10-CM

## 2024-03-10 DIAGNOSIS — J441 Chronic obstructive pulmonary disease with (acute) exacerbation: Principal | ICD-10-CM | POA: Diagnosis present

## 2024-03-10 DIAGNOSIS — F32A Depression, unspecified: Secondary | ICD-10-CM | POA: Diagnosis present

## 2024-03-10 DIAGNOSIS — J439 Emphysema, unspecified: Secondary | ICD-10-CM | POA: Diagnosis not present

## 2024-03-10 DIAGNOSIS — R519 Headache, unspecified: Secondary | ICD-10-CM | POA: Diagnosis not present

## 2024-03-10 DIAGNOSIS — Z8701 Personal history of pneumonia (recurrent): Secondary | ICD-10-CM

## 2024-03-10 DIAGNOSIS — Z87891 Personal history of nicotine dependence: Secondary | ICD-10-CM

## 2024-03-10 DIAGNOSIS — E66811 Obesity, class 1: Secondary | ICD-10-CM | POA: Diagnosis present

## 2024-03-10 DIAGNOSIS — Z825 Family history of asthma and other chronic lower respiratory diseases: Secondary | ICD-10-CM

## 2024-03-10 DIAGNOSIS — J189 Pneumonia, unspecified organism: Secondary | ICD-10-CM | POA: Diagnosis present

## 2024-03-10 LAB — CBC
HCT: 42.2 % (ref 36.0–46.0)
Hemoglobin: 13.9 g/dL (ref 12.0–15.0)
MCH: 26.6 pg (ref 26.0–34.0)
MCHC: 32.9 g/dL (ref 30.0–36.0)
MCV: 80.8 fL (ref 80.0–100.0)
Platelets: 357 10*3/uL (ref 150–400)
RBC: 5.22 MIL/uL — ABNORMAL HIGH (ref 3.87–5.11)
RDW: 15.9 % — ABNORMAL HIGH (ref 11.5–15.5)
WBC: 6.9 10*3/uL (ref 4.0–10.5)
nRBC: 0 % (ref 0.0–0.2)

## 2024-03-10 LAB — BASIC METABOLIC PANEL WITH GFR
Anion gap: 10 (ref 5–15)
BUN: 10 mg/dL (ref 8–23)
CO2: 22 mmol/L (ref 22–32)
Calcium: 9.3 mg/dL (ref 8.9–10.3)
Chloride: 106 mmol/L (ref 98–111)
Creatinine, Ser: 0.78 mg/dL (ref 0.44–1.00)
GFR, Estimated: >60 mL/min (ref >60)
Glucose, Bld: 87 mg/dL (ref 70–99)
Potassium: 3.9 mmol/L (ref 3.5–5.1)
Sodium: 138 mmol/L (ref 135–145)

## 2024-03-10 LAB — TROPONIN I (HIGH SENSITIVITY)
Troponin I (High Sensitivity): 5 ng/L (ref <18)
Troponin I (High Sensitivity): 4 ng/L (ref <18)

## 2024-03-10 MED ORDER — KETOROLAC TROMETHAMINE 30 MG/ML IJ SOLN
30.0000 mg | Freq: Once | INTRAMUSCULAR | Status: DC
  Filled 2024-03-10: qty 1

## 2024-03-10 MED ORDER — ENOXAPARIN SODIUM 40 MG/0.4ML IJ SOSY
40.0000 mg | PREFILLED_SYRINGE | INTRAMUSCULAR | Status: DC
  Administered 2024-03-11 (×2): 40 mg via SUBCUTANEOUS
  Filled 2024-03-10: qty 0.4

## 2024-03-10 MED ORDER — MELATONIN 3 MG PO TABS
3.0000 mg | ORAL_TABLET | Freq: Every day | ORAL | Status: DC
  Administered 2024-03-11 (×2): 3 mg via ORAL
  Filled 2024-03-10: qty 1

## 2024-03-10 MED ORDER — BUDESON-GLYCOPYRROL-FORMOTEROL 160-9-4.8 MCG/ACT IN AERO
2.0000 | INHALATION_SPRAY | Freq: Two times a day (BID) | RESPIRATORY_TRACT | Status: DC
  Administered 2024-03-10 – 2024-03-12 (×3): 2 via RESPIRATORY_TRACT
  Filled 2024-03-10: qty 5.9

## 2024-03-10 MED ORDER — KETOROLAC TROMETHAMINE 15 MG/ML IJ SOLN
15.0000 mg | Freq: Once | INTRAMUSCULAR | Status: AC
  Administered 2024-03-10: 15 mg via INTRAMUSCULAR
  Filled 2024-03-10: qty 1

## 2024-03-10 MED ORDER — AZITHROMYCIN 250 MG PO TABS
250.0000 mg | ORAL_TABLET | Freq: Every day | ORAL | 0 refills | Status: DC
  Filled 2024-03-10: qty 4, 4d supply, fill #0

## 2024-03-10 MED ORDER — BISMUTH SUBSALICYLATE 262 MG/15ML PO SUSP: 30.0000 mL | Freq: Four times a day (QID) | ORAL | Status: DC | PRN

## 2024-03-10 MED ORDER — IPRATROPIUM-ALBUTEROL 0.5-2.5 (3) MG/3ML IN SOLN
3.0000 mL | Freq: Four times a day (QID) | RESPIRATORY_TRACT | Status: DC
  Administered 2024-03-10 – 2024-03-11 (×2): 3 mL via RESPIRATORY_TRACT

## 2024-03-10 MED ORDER — IPRATROPIUM-ALBUTEROL 0.5-2.5 (3) MG/3ML IN SOLN
3.0000 mL | RESPIRATORY_TRACT | Status: AC
  Administered 2024-03-10 (×3): 3 mL via RESPIRATORY_TRACT
  Filled 2024-03-10: qty 6

## 2024-03-10 MED ORDER — PREDNISONE 20 MG PO TABS
60.0000 mg | ORAL_TABLET | Freq: Once | ORAL | Status: AC
Start: 2024-03-10 — End: 2024-03-10
  Administered 2024-03-10: 60 mg via ORAL
  Filled 2024-03-10: qty 3

## 2024-03-10 MED ORDER — IPRATROPIUM-ALBUTEROL 0.5-2.5 (3) MG/3ML IN SOLN
3.0000 mL | Freq: Once | RESPIRATORY_TRACT | Status: AC
  Administered 2024-03-10: 3 mL via RESPIRATORY_TRACT
  Filled 2024-03-10: qty 3

## 2024-03-10 MED ORDER — LORATADINE 10 MG PO TABS
10.0000 mg | ORAL_TABLET | Freq: Every day | ORAL | Status: DC
Start: 2024-03-10 — End: 2024-03-12
  Administered 2024-03-11 (×2): 10 mg via ORAL
  Filled 2024-03-10: qty 1

## 2024-03-10 MED ORDER — IOHEXOL 350 MG/ML SOLN
50.0000 mL | Freq: Once | INTRAVENOUS | Status: AC | PRN
  Administered 2024-03-10: 50 mL via INTRAVENOUS

## 2024-03-10 MED ORDER — AZITHROMYCIN 250 MG PO TABS
500.0000 mg | ORAL_TABLET | Freq: Once | ORAL | Status: AC
  Administered 2024-03-10: 500 mg via ORAL
  Filled 2024-03-10: qty 2

## 2024-03-10 MED ORDER — IPRATROPIUM-ALBUTEROL 0.5-2.5 (3) MG/3ML IN SOLN
3.0000 mL | RESPIRATORY_TRACT | Status: DC
  Filled 2024-03-10: qty 3

## 2024-03-10 NOTE — Assessment & Plan Note (Signed)
 See COPD + asthma exacerbation above.

## 2024-03-10 NOTE — ED Notes (Signed)
 Pt states she is feeling better and pt looks improved.

## 2024-03-10 NOTE — Patient Instructions (Signed)
 Because you are working so hard to breathe, I recommend you go to the hospital for further evaluation and likely admission to our service. I hate you are having such difficulty, and I hope you feel better soon!

## 2024-03-10 NOTE — ED Notes (Signed)
 Pt was complaining about wait and the fact that we gave her a breathing tx and put her back in the lobby. She asked for someone to complain to but when I asked for more info and explained that her vitals were stable and we couldn't move her to a room more quickly at this time she stated she would leave and go to [facility].  This was in triage.  I do not know if she actually left.

## 2024-03-10 NOTE — Progress Notes (Signed)
    SUBJECTIVE:   CHIEF COMPLAINT / HPI:   Asthma follow up Has taken samples of Breztri  without much relief. She is having worsening symptoms over the last month or so. She is worried about her status. Tried a course of prednisone  a month ago which helped for a bit but it came right back. She is having more chest tightness and pain with her symptoms now. She is worsening despite Duonebs and albuterol  at home. She continues to cough violently. She cannot sleep. She has SOB when she walks or even tries to wash herself. She is scared.  PERTINENT  PMH / PSH: HTN, paraseptal emphysema of lung, COPD  OBJECTIVE:   BP (!) 183/88   Pulse 81   Ht 5\' 5"  (1.651 m)   Wt 193 lb 2 oz (87.6 kg)   SpO2 96%   BMI 32.14 kg/m   General: Alert and oriented, anxious-appearing and tearful Skin: Warm, dry, and intact HEENT: NCAT, EOM grossly normal, midline nasal septum, eyes bloodshot Cardiac: RRR, no m/r/g appreciated Respiratory: Diffuse, though diminished, expiratory and inspiratory wheezes with overlying coarse sounds throughout; supraclavicular retractions present, speaking in short sentences, frequently coughing Extremities: Moves all extremities grossly equally Neurological: No gross focal deficit  ASSESSMENT/PLAN:   Assessment & Plan Severe persistent asthma with acute exacerbation Also with likely concurrent COPD exacerbation. Worsening over time despite albuterol , duonebs around the clock, Breztri , and course of steroids. Given exam and WOB today, while satting normally on exam, recommended patient present to ED for further evaluation with inhaled bronchodilators, steroids, CXR, and likely admission to FMTS. Unable to reach ED charge RN to give report.   Genetta Kenning, MD [facility]

## 2024-03-10 NOTE — Assessment & Plan Note (Deleted)
 IDA: Hold iron GERD: *** Pantoprazole 

## 2024-03-10 NOTE — Discharge Instructions (Addendum)
 Dear Lydia Griffin,  Thank you for letting us  participate in your care. You were hospitalized for exertional dyspnea and diagnosed with COPD exacerbation (HCC). You were treated with CT scan to rule out pneumonia and other pathologies, echocardiogram to rule out heart failure, and antibiotics/steroids/duonebs to treat asthma/COPD exacerbation.   You have completed your course of antibiotics in the hospital, you do not need to take any antibiotics at home right now.  You do need to finish your steroid course at home.  Follow these instructions: - Take 1 tablet prednisone  40 mg daily for the next 2 days. You got your dose of prednisone  today before leaving the hospital, so you need to take this at home tomorrow and Friday.  Your last dose will be on Friday 5/23.   POST-HOSPITAL & CARE INSTRUCTIONS Once you receive your Trelegy, take as prescribed. If you have any issuing assessing this medication, please reach out to your primary care doctor for support.  Go to your follow up appointments (listed below)   DOCTOR'S APPOINTMENT   Future Appointments  Date Time Provider Department Center  03/14/2024 11:10 AM Lydia Headland, MD [facility]     Take care and be well!  Family Medicine Teaching Service Inpatient Team [place]  [facility]  [address] [place], [place] [address] [phone]

## 2024-03-10 NOTE — H&P (Addendum)
 Hospital Admission History and Physical Service Pager: [phone]  Patient name: Lydia Griffin Medical record number: [id number] Date of Birth: April 12, 1961 Age: 63 y.o. Gender: female  Primary Care Provider: Wilhemena Harbour, MD Consultants: none Code Status: Full  Preferred Emergency Contact:  First: Vana Gelineau, bestfriend/sister, [phone].  Second: Armin Bers, friend, does not know her number    Chief Complaint: Shortness of breath  Assessment and Plan: Lydia Griffin is a 63 y.o. female with past history of COPD on Trelegy, asthma presenting with shortness of breath. Differential for this patient's presentation of this includes asthma/COPD exacerbation, pneumonia, pneumothorax, ACS, CHF exacerbation.  Asthma/COPD most likely as patient is having wheezing on exam that is responding to DuoNebs he does not have history of other respiratory conditions.  Pneumonia ruled down with no fever, normal WBC, and no consolidation on chest x-ray but has substantial history of pneumonia including recent multifocal pneumonia so we will get CT scan.  ACS is ruled out with normal EKG and troponin.  Pneumothorax is ruled out with normal chest x-ray.  CHF exacerbation ruled down with no crackles on exam or history of heart failure.  For left arm tingling concerning differential of cervical radiculopathy, carpal tunnel syndrome, peripheral neuropathy, thoracic outlet syndrome, and brachial plexopathy, CVA.  Assessment & Plan COPD + asthma exacerbation Satting upper 90s on room air, hemodynamically stable, dyspnea per pt. Rhonchi and wheezes throughout, no crackles.  Admit to FMTS, attending Dr. Tedra Fears, Vital signs with oxygen  sat per floor, keep O2 88-92 Regular diet  PT/OT to treat VTE prophylaxis enoxaparin  AM CBC/BMP  CT chest with contrast rule out pneumonia and other pathologies Start DuoNebs every 6 hours Continue azithromycin  500 mg x 3 days (5/19 - 5/21) Continue prednisone  40  mg x 5 days (5/19 - 5/23) Consider echo if pulmonary edema/effusion on CT Start Breztri   to substitute for Trelegy and take with her at discharge to bridge her until she gets her Trelogy in mail Continue loratadine  10 mg at bedtime for alleriges/asthma Numbness and tingling in left arm Intermittent numbness and tingling of left upper extremity over the past week.  Will rule out any masses causing thoracic outlet syndrome/brachial plexopathy on CT.  Otherwise deferred to outpatient follow-up. Paraseptal Emphysema of lung (HCC) See COPD + asthma exacerbation above.    FEN/GI: Regular diet VTE Prophylaxis: Enoxaparin  40  Disposition: Observation, MedSurg  History of Present Illness:  Lydia Griffin is a 63 y.o. female presenting with shortness of breath for the past month. Reports that she is having "spasms" and tightening in her bilateral chest wall on the horizontal plane where epigastrium is but involving medial and lateral aspects. She reports with excertions that she has severe SOB but that it improves with shortness of breath. Reports that pain at times is very severe. Reports that this has been going on for the last couple of weeks.   Reports that she was unable to access her trilogy due to the cost of this medication. Last time she took trelogy was about a month and reports stopping this around time that her SOB started worsening. Reports that she just got financial support from compnay for 3 months of samples. Reports that she should have adequate access to this medication for the next year. Reports that she should have this medication by 22nd Thursday. Reports that she has access to and is adherent to other medications.   She also reports that she has intermittent tingling / "falling  asleep" sensation of the left arm for about a week. Does not report anything that worsens or improves this pain. Reports no history of this prior to current episode.   Reports that she has not do PFT or seen  pulmonology in over 10 years. She reports that she has had pneumonia multiples over the last year. She has long history of asthma and recent diagnosis of COPD.   Additionally pt reports that she has been feeling depressed over the last month. Reports that her energy levels have been low, especially with regard to exertion. Reports that her sleep has been poor and that she reports different sleep schedules and falls asleep okay but has issues staying asleep.   In the ED, had increased work of breathing.   Review Of Systems: Per HPI with the following additions:  Review of Systems  Constitutional:  Positive for malaise/fatigue. Negative for diaphoresis, fever and weight loss.  HENT:  Negative for congestion and sore throat.   Respiratory:  Positive for cough, shortness of breath and wheezing. Negative for sputum production.   Cardiovascular:  Positive for chest pain. Negative for orthopnea and leg swelling.  Gastrointestinal:  Positive for constipation and nausea. Negative for abdominal pain, diarrhea and vomiting.  Skin:  Negative for rash.  Neurological:  Positive for tingling.  Psychiatric/Behavioral:  Positive for depression. Negative for suicidal ideas. The patient has insomnia.      Pertinent Past Medical History: Asthma since childhood COPD last 10 years History of multifocal pneumonia  Remainder reviewed in history tab.   Pertinent Past Surgical History: Tubal ligation 1985 Left breast biopsy EGD with biopsy in January 2025   Remainder reviewed in history tab.   Pertinent Social History: Tobacco use: never smoked/vaped Alcohol use: denies Other Substance use: none Reports short history of working in . She reports that she has an old house that could have mold.  Lives with her mother.   Pertinent Family History: Asthma in both parents.   Remainder reviewed in history tab.   Important Outpatient Medications: Albuterol   Singular, has not taken in several months,  instead taking loratadine  Trelogy, see HPI  Loratadine  10 once at night Duoneb nebulizer Multivitamin Not taking iron or pantoprazole  Peptobismal  Zofran , as needed (ran out and was taking OTC vertigo med "dromamine")  Remainder reviewed in medication history.   Objective: BP (!) 162/81 (BP Location: Right Arm)   Pulse 91   Temp 98.4 F (36.9 C) (Oral)   Resp 20   Ht 5\' 5"  (1.651 m)   Wt 87.5 kg   SpO2 94%   BMI 32.12 kg/m  Exam: General: calm, cooperative Eyes: nomral appearing. No scleral icterus. No obvious pallor.  ENTM: normal appearing. No URI symptoms.  Neck: supple, no tenderness.  Cardiovascular: normal rate and rhythm. Normal heart sounds.  Respiratory: normal effort while laying at 45 degrees at rest in bed. rhonchi and wheezes present throughout. Diffused coarse breath sound  Gastrointestinal: no tenderness to palpation, soft, obese.  MSK: no tenderness to palpation of all joints.  Derm: no obvious skin changes throughout. Deferred GU area.  Neuro: alert, oriented x3. Attention and memory intact. Strength 5/5 throughout. Sensation changes in LUE but intact sensation to light touch compared to RUE.  Psych: mood and affect depressed. Behavior appropriate. Denies suicidal thoughts.   Labs:  CBC BMET  Recent Labs  Lab 03/10/24 1241  WBC 6.9  HGB 13.9  HCT 42.2  PLT 357   Recent Labs  Lab 03/10/24 1241  NA 138  K 3.9  CL 106  CO2 22  BUN 10  CREATININE 0.78  GLUCOSE 87  CALCIUM 9.3     Troponin: 5-> 4  EKG: Normal sinus rhythm, no ST segment changes compared to previous EKG.  Improved QTc at 430 now.   Imaging Studies Performed:  Chest x-ray 5/19  impression from Radiologist:  1. Mild bibasilar atelectasis and/or infiltrate, decreased in severity when compared to the prior study. 2. Very mild, hazy bilateral upper lobe atelectasis and/or infiltrate.    My Interpretation: Borderline cardiomegaly, hazy infiltrates bilaterally, no obvious  consolidations. Possible mild tracheal deviation to right but no obvious pneumothorax so likely from atelectasis.   CT chest w/ contrast Pending   Verdell Given, MD 03/10/2024, 10:44 PM PGY-1, [facility]  FPTS Intern pager: [phone], text pages welcome Secure chat group [facility] Teaching Service   I was personally present and re-performed the exam. I verified the service and findings are accurately documented in the intern's note.  Goble Last, MD 03/11/2024 1:26 AM

## 2024-03-10 NOTE — Plan of Care (Signed)
 Paged for admission for COPD exacerbation.  Per ED provider, patient was told at our office that she would need admission and she is very upset with the ED team.  Shelah Derry down to assess patient for admission.  Upon entering the room patient was obviously agitated.  I asked the patient why she felt like she needed to come into the hospital.  Patient began to yell and say that the ED providers did not listen to her.  She reports she has been out of her Trelegy for 1 month and has been wheezing since.  She reports having difficulty with shortness of breath and activities of daily living.  I discussed with the patient that I did not appreciate her yelling at me as I was only there to help her and get her admitted to the hospital.  She felt like I was insinuating she did not need to be admitted and was deeply offended by that.  I told the patient that the night team was coming on at 7 PM and they would be down to admit her.  In the meantime I will place a bed request and basic admission orders.  I was not able to fully examine her due to her agitation.  She appeared to be breathing comfortably and was able to speak in full sentences.  Her O2 in the room was 95% on room air.  Clem Currier, DO [facility], PGY-2 03/10/24 6:51 PM

## 2024-03-10 NOTE — ED Triage Notes (Signed)
 Pt has been sick for one month and having asthma exacerbation. Nebs and rescue inhaler not helping. Has gotten worse today.

## 2024-03-10 NOTE — ED Notes (Signed)
 Difficulty with IV start with RN/Medic x2. IV team consult placed.

## 2024-03-10 NOTE — H&P (Addendum)
  Call Pager [phone] for any questions or notifications regarding this patient FMTS Attending Admission Note: Lydia Grice MD Attending pager:[phone] I  have seen and examined this patient, reviewed their chart. I have discussed this patient with the resident. I agree with the resident's findings, assessment and care plan as per H&P which is in progress. Briefly, HPI 63 yo female with known COPD who reports 1 month of increased SOB, especially with exertion. Has been seen outpatient and had steroids but no improvement. Less problem when she is at rest, but even walking across her room or doing simple task like dressing increases her dyspnea. She is feeling frustrated and overwhelmed. Feels she  is not getting better, actually getting worse.  PERTINENT  PMH / PSH: I have reviewed the patient's medications, allergies, past medical and surgical history, smoking status.  Pertinent findings that relate to today's visit / issues include: Hx paraseptal emphysema Hx severe bronchitis/multifocal pneumonia in January 2024. Hx chronic asthma Hx iron deficiency anemia Hx H. Pylori infection s/p recent treatment with test of cure (per patient) Hx anxiety and depression   CT chest 10/2022: partial report: "lung changes with emphysematous changes and pulmonary scarring. Significant peribronchial thickening, areas of interstitial prominence and multifocal airspace opacities most significant in the left lower lobe with lobar pneumonia.  ...  - Severe bronchitis and multifocal pneumonia. - Enlarged mediastinal and hilar lymph nodes likely reactive given the lung findings.   CT chest 01/2021 partial report: " IMPRESSION: 1. Diffuse bilateral bronchial wall thickening and extensive bronchial plugging throughout, particularly notable in the dependent right lower lobe. There are associated clustered centrilobular and tree-in-bud nodules. Small consolidations in the medial segment right middle  lobe and lingula. Appearance is generally similar to prior CT dated 12/20/2019. 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 -No evidence of fibrotic interstitial lung disease. - Minimal paraseptal emphysema.  EKG today: NSR rate 71. Nonspecific ST changes V1.  QTc 430 Does not meet criteria for LVH  EXAM: tearful female, NAD. Saturation 94% room air. She develops SOB when trying to discuss issues CV RRR LUNGS decreased breath sounds throughout occasional expiratory wheeze and course sounding cough PSYCH: AxOx4. Good eye contact.. No psychomotor retardation or agitation. Appropriate speech fluency and content. Asks and answers questions appropriately. Mood is intermittently tearful  A/P Dyspnea on minimal exertion Paraseptal empysema / COPD exacerbation/ possible asthma component to respiratory issues.: review of chart (Hx severe bronchitis/multifocal pneumonia in January 2024.; Hx chronic asthma) concerns me for severity of underlying lung disease. Would get CT chest,Depending on results, consider getting up with pulmonology, either inpatient vs outpatient. - given her sats are better at rest, will get ECHO to rule out heart contribution to DOE. Has risk factors for CV disease 2. Mood: she is tearful and frustrated during ur conversation, better by end of it. We discussed some of her current stressors. Discussed possibility of restarting some type of antidepressant; she does tell me about a previous episode where she was on medication and may have had some manic type symptoms. Will do chart review as she cannot remember med, discuss with day attending Dr. Grandville Lax.

## 2024-03-10 NOTE — Assessment & Plan Note (Signed)
 Intermittent numbness and tingling of left upper extremity over the past week.  Will rule out any masses causing thoracic outlet syndrome/brachial plexopathy on CT.  Otherwise deferred to outpatient follow-up.
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
## 2024-03-10 NOTE — Assessment & Plan Note (Addendum)
 Also with likely concurrent COPD exacerbation. Worsening over time despite albuterol , duonebs around the clock, Breztri , and course of steroids. Given exam and WOB today, while satting normally on exam, recommended patient present to ED for further evaluation with inhaled bronchodilators, steroids, CXR, and likely admission to FMTS. Unable to reach ED charge RN to give report.

## 2024-03-10 NOTE — ED Notes (Signed)
Pt called for VS; no answer

## 2024-03-10 NOTE — Assessment & Plan Note (Addendum)
 Satting upper 90s on room air, hemodynamically stable, dyspnea per pt. Rhonchi and wheezes throughout, no crackles.  Admit to FMTS, attending Dr. Tedra Fears, Vital signs with oxygen  sat per floor, keep O2 88-92 Regular diet  PT/OT to treat VTE prophylaxis enoxaparin  AM CBC/BMP  CT chest with contrast rule out pneumonia and other pathologies Start DuoNebs every 6 hours Continue azithromycin  500 mg x 3 days (5/19 - 5/21) Continue prednisone  40 mg x 5 days (5/19 - 5/23) Consider echo if pulmonary edema/effusion on CT Start Breztri   to substitute for Trelegy and take with her at discharge to bridge her until she gets her Trelogy in mail Continue loratadine  10 mg at bedtime for alleriges/asthma

## 2024-03-11 ENCOUNTER — Observation Stay (HOSPITAL_COMMUNITY)

## 2024-03-11 DIAGNOSIS — R0609 Other forms of dyspnea: Secondary | ICD-10-CM

## 2024-03-11 LAB — ECHOCARDIOGRAM COMPLETE
Area-P 1/2: 3.66 cm2
Height: 65 in
P 1/2 time: 299 ms
S' Lateral: 3.1 cm
Weight: 3088 [oz_av]

## 2024-03-11 LAB — BASIC METABOLIC PANEL WITH GFR
Anion gap: 8 (ref 5–15)
BUN: 12 mg/dL (ref 8–23)
CO2: 23 mmol/L (ref 22–32)
Calcium: 9.3 mg/dL (ref 8.9–10.3)
Chloride: 103 mmol/L (ref 98–111)
Creatinine, Ser: 0.84 mg/dL (ref 0.44–1.00)
GFR, Estimated: >60 mL/min (ref >60)
Glucose, Bld: 98 mg/dL (ref 70–99)
Potassium: 4.1 mmol/L (ref 3.5–5.1)
Sodium: 134 mmol/L — ABNORMAL LOW (ref 135–145)

## 2024-03-11 LAB — CBC
HCT: 40.2 % (ref 36.0–46.0)
Hemoglobin: 13.5 g/dL (ref 12.0–15.0)
MCH: 26.5 pg (ref 26.0–34.0)
MCHC: 33.6 g/dL (ref 30.0–36.0)
MCV: 79 fL — ABNORMAL LOW (ref 80.0–100.0)
Platelets: 358 10*3/uL (ref 150–400)
RBC: 5.09 MIL/uL (ref 3.87–5.11)
RDW: 15.9 % — ABNORMAL HIGH (ref 11.5–15.5)
WBC: 7 10*3/uL (ref 4.0–10.5)
nRBC: 0 % (ref 0.0–0.2)

## 2024-03-11 MED ORDER — PREDNISONE 20 MG PO TABS
40.0000 mg | ORAL_TABLET | Freq: Every day | ORAL | Status: DC
  Administered 2024-03-11 – 2024-03-12 (×2): 40 mg via ORAL
  Filled 2024-03-11: qty 2

## 2024-03-11 MED ORDER — IPRATROPIUM-ALBUTEROL 0.5-2.5 (3) MG/3ML IN SOLN: 3.0000 mL | RESPIRATORY_TRACT | Status: DC | PRN

## 2024-03-11 MED ORDER — ARIPIPRAZOLE 5 MG PO TABS
5.0000 mg | ORAL_TABLET | Freq: Every day | ORAL | Status: DC
  Administered 2024-03-11 – 2024-03-12 (×2): 5 mg via ORAL
  Filled 2024-03-11 (×2): qty 1

## 2024-03-11 MED ORDER — AZITHROMYCIN 500 MG PO TABS
500.0000 mg | ORAL_TABLET | Freq: Every day | ORAL | Status: AC
  Administered 2024-03-11 – 2024-03-12 (×2): 500 mg via ORAL
  Filled 2024-03-11 (×2): qty 1

## 2024-03-11 MED ORDER — PERFLUTREN LIPID MICROSPHERE
1.0000 mL | INTRAVENOUS | Status: AC | PRN
  Administered 2024-03-11: 3 mL via INTRAVENOUS

## 2024-03-11 MED ORDER — HYDROXYZINE HCL 10 MG PO TABS: 10.0000 mg | ORAL_TABLET | Freq: Three times a day (TID) | ORAL | Status: DC | PRN

## 2024-03-11 MED ORDER — ACETAMINOPHEN 325 MG PO TABS
650.0000 mg | ORAL_TABLET | Freq: Four times a day (QID) | ORAL | Status: DC | PRN
  Administered 2024-03-11 – 2024-03-12 (×3): 650 mg via ORAL
  Filled 2024-03-11 (×2): qty 2

## 2024-03-11 NOTE — Evaluation (Signed)
 Occupational Therapy Evaluation Patient Details Name: Lydia Griffin MRN: [id number] DOB: 1961-05-19 Today's Date: 03/11/2024   History of Present Illness   63 yo female admitted 03/10/24 with DOE and asthma exacerbation. PMhx: Asthma, HTN, GAD     Clinical Impressions Pt reports ind at baseline with ADLs/functional mobility, lives alone but reports she can have PRN assist from family. Pt ind with bed mobility and transfers, no assist for ADLs during session, SpO2 remaining 94-96% on RA. Pt educated on energy conservation strategies for home. Pt presenting with impairments listed below, however has no acute OT needs at this time, will s/o. Please reconsult if there is a change in pt status.     If plan is discharge home, recommend the following:   Assistance with cooking/housework     Functional Status Assessment   Patient has had a recent decline in their functional status and demonstrates the ability to make significant improvements in function in a reasonable and predictable amount of time.     Equipment Recommendations   None recommended by OT     Recommendations for Other Services         Precautions/Restrictions   Precautions Precautions: None Restrictions Weight Bearing Restrictions Per Provider Order: No     Mobility Bed Mobility Overal bed mobility: Independent                  Transfers Overall transfer level: Independent                        Balance Overall balance assessment: No apparent balance deficits (not formally assessed)                                         ADL either performed or assessed with clinical judgement   ADL Overall ADL's : At baseline                                       General ADL Comments: pt performing toileting, standing grooming task and UB ADL without assist     Vision   Vision Assessment?: No apparent visual deficits     Perception Perception: Not  tested       Praxis Praxis: Not tested       Pertinent Vitals/Pain Pain Assessment Pain Assessment: No/denies pain     Extremity/Trunk Assessment Upper Extremity Assessment Upper Extremity Assessment: Overall WFL for tasks assessed (pt reports decr intermittent sensation to LUE, also with L UE ring finger contracture from baseball incident when she was 8)   Lower Extremity Assessment Lower Extremity Assessment: Overall WFL for tasks assessed   Cervical / Trunk Assessment Cervical / Trunk Assessment: Normal   Communication Communication Communication: No apparent difficulties   Cognition Arousal: Alert Behavior During Therapy: WFL for tasks assessed/performed Cognition: No apparent impairments                               Following commands: Intact       Cueing  General Comments   Cueing Techniques: Verbal cues  SpO2 94-96% on RA throughout session   Exercises     Shoulder Instructions      Home Living Family/patient expects to be discharged to:: Private residence Living  Arrangements: Alone Available Help at Discharge: Family;Friend(s);Available 24 hours/day Type of Home: House Home Access: Stairs to enter [employer] of Steps: 2   Home Layout: One level     Bathroom Shower/Tub: Chief Strategy Officer: Standard     Home Equipment: None   Additional Comments: has 2 dogs      Prior Functioning/Environment Prior Level of Function : Independent/Modified Independent;Driving             Mobility Comments: no AD use ADLs Comments: ind with ADL/IADL    OT Problem List: Decreased activity tolerance   OT Treatment/Interventions: Self-care/ADL training;Therapeutic exercise;Energy conservation;DME and/or AE instruction;Therapeutic activities;Patient/family education;Balance training      OT Goals(Current goals can be found in the care plan section)   Acute Rehab OT Goals Patient Stated Goal: none stated OT  Goal Formulation: With patient Time For Goal Achievement: 03/25/24 Potential to Achieve Goals: Good   OT Frequency:  Min 2X/week    Co-evaluation              AM-PAC OT "6 Clicks" Daily Activity     Outcome Measure Help from another person eating meals?: None Help from another person taking care of personal grooming?: None Help from another person toileting, which includes using toliet, bedpan, or urinal?: None Help from another person bathing (including washing, rinsing, drying)?: None Help from another person to put on and taking off regular upper body clothing?: None Help from another person to put on and taking off regular lower body clothing?: None 6 Click Score: 24   End of Session Nurse Communication: Mobility status  Activity Tolerance: Patient tolerated treatment well Patient left: with call bell/phone within reach;in chair  OT Visit Diagnosis: Muscle weakness (generalized) (M62.81)                Time: 1610-9604 OT Time Calculation (min): 24 min Charges:  OT General Charges $OT Visit: 1 Visit OT Evaluation $OT Eval Low Complexity: 1 Low OT Treatments $Self Care/Home Management : 8-22 mins  Dionisios Ricci K, OTD, OTR/L SecureChat Preferred Acute Rehab [phone]   Benedict Brain Koonce 03/11/2024, 9:54 AM

## 2024-03-11 NOTE — Assessment & Plan Note (Addendum)
 O2 saturation mid 90s overnight on room air.  Persistent wheezing throughout all lung fields exam this morning.  CT chest yesterday without evidence of PNA.  Patient is very anxious about her respiratory status which is probably contributing to her dyspnea. Continue DuoNebs every 6 hours Continue azithromycin  500 mg x 3 days (5/19 - 5/21) Continue prednisone  40 mg x 5 days (5/19 - 5/23) Continue Breztri  to substitute for Trelegy and take with her at discharge to bridge her until she gets her Trelegy in mail Continue loratadine  10 mg at bedtime for alleriges/asthma

## 2024-03-11 NOTE — Assessment & Plan Note (Addendum)
 Anxiety/Depression: Previously on sertraline  100 mg, unclear why this was discontinued. Some concern for possible BPD. Initiate Abilify 5 mg today, as well as Atarax  10 mg TID PRN. Numbness/tingling in left arm: Comes and goes.  Imaging negative.  Appropriate for outpatient follow-up; patient may benefit from PT.

## 2024-03-11 NOTE — Progress Notes (Signed)
     Daily Progress Note Intern Pager: [phone]  Patient name: Lydia Griffin Medical record number: [id number] Date of birth: 05-23-1961 Age: 63 y.o. Gender: female  Primary Care Provider: Wilhemena Harbour, MD Consultants: None Code Status: FULL  Pt Overview and Major Events to Date:  5/19 - admitted  Assessment and Plan: Lydia Griffin is a 63 y.o. female presented with SOB after running out of Trelegy at home; found to be in COPD exacerbation.  Aspiratory status improved this morning, but patient is understandably very anxious about going home and worsening again. Assessment & Plan COPD + asthma exacerbation O2 saturation mid 90s overnight on room air.  Persistent wheezing throughout all lung fields exam this morning.  CT chest yesterday without evidence of PNA.  Patient is very anxious about her respiratory status which is probably contributing to her dyspnea. Continue DuoNebs every 6 hours Continue azithromycin  500 mg x 3 days (5/19 - 5/21) Continue prednisone  40 mg x 5 days (5/19 - 5/23) Continue Breztri  to substitute for Trelegy and take with her at discharge to bridge her until she gets her Trelegy in mail Continue loratadine  10 mg at bedtime for alleriges/asthma Chronic health problem Anxiety/Depression: Previously on sertraline  100 mg, unclear why this was discontinued. Some concern for possible BPD. Initiate Abilify 5 mg today, as well as Atarax  10 mg TID PRN. Numbness/tingling in left arm: Comes and goes.  Imaging negative.  Appropriate for outpatient follow-up; patient may benefit from PT.   FEN/GI: Regular diet PPx: home Enoxaparin  40 mg Dispo:Home pending clinical improvement . Barriers include improved respiratory status, ambulatory pulse ox.   Subjective:  Seen this morning sitting up in chair bedside eating breakfast.  She reports her breathing is doing better, but she recounts her concerns about her last physician and her health she becomes tearful.  She is very  anxious that she will discharge home and worsen.  She is known several people in her life, including her sister, who passed away from respiratory conditions.  Objective: Temp:  [97.8 F (36.6 C)-98.4 F (36.9 C)] 98.1 F (36.7 C) (05/20 0900) Pulse Rate:  [73-91] 87 (05/20 0900) Resp:  [16-23] 18 (05/20 0900) BP: (119-188)/(59-123) 161/89 (05/20 0900) SpO2:  [94 %-100 %] 94 % (05/20 0900) Weight:  [87.5 kg-87.6 kg] 87.5 kg (05/19 1228) Physical Exam: General: Sitting up in chair bedside, eating breakfast, NAD. Does become tearful. Cardiovascular: RRR Respiratory: Diffuse wheezing throughout. No increased WOB on RA. Extremities: Warm and well perfused.  Laboratory: Most recent CBC Lab Results  Component Value Date   WBC 7.0 03/11/2024   HGB 13.5 03/11/2024   HCT 40.2 03/11/2024   MCV 79.0 (L) 03/11/2024   PLT 358 03/11/2024   Most recent BMP    Latest Ref Rng & Units 03/11/2024    7:42 AM  BMP  Glucose 70 - 99 mg/dL 98   BUN 8 - 23 mg/dL 12   Creatinine 6.96 - 1.00 mg/dL 2.95   Sodium 284 - 132 mmol/L 134   Potassium 3.5 - 5.1 mmol/L 4.1   Chloride 98 - 111 mmol/L 103   CO2 22 - 32 mmol/L 23   Calcium 8.9 - 10.3 mg/dL 9.3     Omar Bibber, DO 03/11/2024, 10:27 AM  PGY-1, [facility] FPTS Intern pager: [phone], text pages welcome Secure chat group [facility] Teaching Service

## 2024-03-11 NOTE — Hospital Course (Addendum)
 Lydia Griffin is a 63 y.o.female with a history of COPD on Trelegy and asthma who was admitted to the [facility] Medicine Teaching Service at [facility] for worsening dyspnea over the last month. Her hospital course is detailed below:  Dyspnea on exertion  COPD + asthma exacerbation Dyspnea over last month with minimal exertion. Pt also reports having "spasms" and tightening across chest. Nonadherence to triple therapy PTA due to cost but set for next year starting Thursday. Satting normal on RA at rest. CT chest obtained which showed chronic bronchitis and emphysema and improved of multifocal pneumonia from Jan.  Started treatment for presumed COPD + asthma exacerbations with azithomycin, steroids, and duonebs. To rule out CHF, echo obtained which showed LVEF 65-70% but G1DD. Last seen by pulm outpatient many years ago and could benefit from referral again.   Numbness and tingling in left arm Intermittent numbness and tingling of left upper extremity over the past week. Ruled out masses causing thoracic outlet syndrome/brachial plexopathy on CT. Deferred to outpatient for work up if continued. Could consider cervical radiculopathy, carpal tunnel syndrome, peripheral neuropathy from B12/folate/etc. Low suspicion for CVA given intermittent nature.   Mood disorder Currently depressed. Hx of manic-like episode upon initiating zoloft . Given this, started Abilify 5 mg as mood stabilizer. Unfortunately patient developed headache, so this was discontinued. Could consider lamotrigine or lurasidone.   PCP Follow-up Recommendations: Pulm follow up outpatient Follow up if patient is able to access Trelegy (should have received in mail 5/22). See if numbness/tingling improving and work up  Recommend PFTs, Pulmonology Consider Psychiatry consult for BPD. Offered atarax  10 mg in hospital for anxiety. Pt did not use, but may benefit outpt. H/o prolonged QTc; recheck EKG on follow up.

## 2024-03-11 NOTE — Progress Notes (Signed)
 PT Cancellation Note  Patient Details Name: Lydia Griffin MRN: [id number] DOB: 14-Aug-1961   Cancelled Treatment:    Reason Eval/Treat Not Completed: PT screened, no needs identified, will sign off (seen by OT and informed no need, will sign off)   [name] B [name] 03/11/2024, 8:43 AM Annis Baseman, PT Acute Rehabilitation Services Office: [phone]

## 2024-03-11 NOTE — Assessment & Plan Note (Deleted)
 Intermittent numbness and tingling of left upper extremity over the past week.  Will rule out any masses causing thoracic outlet syndrome/brachial plexopathy on CT.  Otherwise deferred to outpatient follow-up.

## 2024-03-11 NOTE — Progress Notes (Signed)
SATURATION QUALIFICATIONS: (This note is used to comply with regulatory documentation for home oxygen)  Patient Saturations on Room Air at Rest = 96  Patient Saturations on Room Air while Ambulating = 94   

## 2024-03-11 NOTE — Assessment & Plan Note (Deleted)
 See COPD + asthma exacerbation above.

## 2024-03-11 NOTE — Plan of Care (Signed)
 Patient AAOx4. Complained of spasm to side, prn tylenol  ordered and given with + effect. Ambulated in hallway 2 times this shift. Independent. Safety precautions maintained.    Problem: Health Behavior/Discharge Planning: Goal: Ability to manage health-related needs will improve Outcome: Progressing   Problem: Clinical Measurements: Goal: Respiratory complications will improve Outcome: Progressing   Problem: Elimination: Goal: Will not experience complications related to bowel motility Outcome: Progressing   Problem: Pain Managment: Goal: General experience of comfort will improve and/or be controlled Outcome: Progressing   Problem: Safety: Goal: Ability to remain free from injury will improve Outcome: Progressing

## 2024-03-11 NOTE — Plan of Care (Signed)

## 2024-03-11 NOTE — Progress Notes (Signed)
  Echocardiogram 2D Echocardiogram has been performed.  Fain Home RDCS 03/11/2024, 3:07 PM

## 2024-03-12 ENCOUNTER — Other Ambulatory Visit (HOSPITAL_COMMUNITY): Payer: Self-pay

## 2024-03-12 MED ORDER — PREDNISONE 20 MG PO TABS
40.0000 mg | ORAL_TABLET | Freq: Every day | ORAL | 0 refills | Status: AC
Start: 2024-03-12 — End: 2024-03-14
  Filled 2024-03-12: qty 4, 2d supply, fill #0

## 2024-03-12 NOTE — Discharge Summary (Signed)
 Family Medicine Teaching [facility] Discharge Summary  Patient name: Lydia Griffin Medical record number: [id number] Date of birth: 02-25-1961 Age: 63 y.o. Gender: female Date of Admission: 03/10/2024  Date of Discharge: 03/12/24 Admitting Physician: Charise Companion, MD  Primary Care Provider: Wilhemena Harbour, MD Consultants: None  Indication for Hospitalization: Shortness of breath  Brief Hospital Course:  Lydia Griffin is a 63 y.o.female with a history of COPD on Trelegy and asthma who was admitted to the [facility] Medicine Teaching Service at [facility] for worsening dyspnea over the last month. Her hospital course is detailed below:  Dyspnea on exertion  COPD + asthma exacerbation Dyspnea over last month with minimal exertion. Pt also reports having "spasms" and tightening across chest. Nonadherence to triple therapy PTA due to cost but set for next year starting Thursday. Satting normal on RA at rest. CT chest obtained which showed chronic bronchitis and emphysema and improved of multifocal pneumonia from Jan.  Started treatment for presumed COPD + asthma exacerbations with azithomycin, steroids, and duonebs. To rule out CHF, echo obtained which showed LVEF 65-70% but G1DD. Last seen by pulm outpatient many years ago and could benefit from referral again.   Numbness and tingling in left arm Intermittent numbness and tingling of left upper extremity over the past week. Ruled out masses causing thoracic outlet syndrome/brachial plexopathy on CT. Deferred to outpatient for work up if continued. Could consider cervical radiculopathy, carpal tunnel syndrome, peripheral neuropathy from B12/folate/etc. Low suspicion for CVA given intermittent nature.   Mood disorder Currently depressed. Hx of manic-like episode upon initiating zoloft . Given this, started Abilify 5 mg as mood stabilizer. Unfortunately patient developed headache, so this was discontinued. Could consider lamotrigine or lurasidone.    PCP Follow-up Recommendations: Pulm follow up outpatient Follow up if patient is able to access Trelegy (should have received in mail 5/22). See if numbness/tingling improving and work up  Recommend PFTs, Pulmonology Consider Psychiatry consult for BPD. Offered atarax  10 mg in hospital for anxiety. Pt did not use, but may benefit outpt. H/o prolonged QTc; recheck EKG on follow up.    Discharge Diagnoses/Problem List:  Principal Problem:   COPD + asthma exacerbation Active Problems:   Paraseptal Emphysema of lung (HCC)   Chronic health problem   Numbness and tingling in left arm  Disposition: home  Discharge Condition: Stable  Discharge Exam:  General: Lying in bed comfortably, NAD CV: RRR Respiratory: Diffuse wheezing throughout, similar to prior exam but moving air well.  Normal work of breathing on room air.  Significant Procedures: None  Significant Labs and Imaging:  Recent Labs  Lab 03/10/24 1241 03/11/24 0742  WBC 6.9 7.0  HGB 13.9 13.5  HCT 42.2 40.2  PLT 357 358   Recent Labs  Lab 03/10/24 1241 03/11/24 0742  NA 138 134*  K 3.9 4.1  CL 106 103  CO2 22 23  GLUCOSE 87 98  BUN 10 12  CREATININE 0.78 0.84  CALCIUM 9.3 9.3    5/19 CXR: IMPRESSION: 1. Mild bibasilar atelectasis and/or infiltrate, decreased in severity when compared to the prior study. 2. Very mild, hazy bilateral upper lobe atelectasis and/or infiltrate.  5/20 CT chest: IMPRESSION: 1. Previous left lower lobe consolidative change has resolved in the interval. 2. Emphysema with diffuse bronchial wall thickening and mild bronchiectasis. 3. Increased peribronchovascular soft tissue and scattered areas of mucoid impaction. Findings are compatible with chronic bronchitis. 4. Aortic Atherosclerosis (ICD10-I70.0) and Emphysema (ICD10-J43.9).  Results/Tests Pending at Time  of Discharge: None  Discharge Medications:  Allergies as of 03/12/2024   No Known Allergies       Medication List     PAUSE taking these medications    montelukast  10 MG tablet Wait to take this until your doctor or other care provider tells you to start again. Commonly known as: SINGULAIR  Take 1 tablet (10 mg total) by mouth at bedtime.       STOP taking these medications    Sleep Aid 25 MG tablet Generic drug: doxylamine (Sleep)       TAKE these medications    acetaminophen  650 MG CR tablet Commonly known as: TYLENOL  Take 1,300 mg by mouth as needed for pain.   albuterol  108 (90 Base) MCG/ACT inhaler Commonly known as: VENTOLIN  HFA Inhale 2 puffs into the lungs every 6 (six) hours as needed for wheezing or shortness of breath.   bismuth  subsalicylate 262 MG/15ML suspension Commonly known as: PEPTO BISMOL Take 30 mLs by mouth every 6 (six) hours as needed.   calcium carbonate 500 MG chewable tablet Commonly known as: TUMS - dosed in mg elemental calcium Chew 4 tablets by mouth as needed for indigestion or heartburn.   CLARITIN  PO Take 1 tablet by mouth at bedtime.   ipratropium-albuterol  0.5-2.5 (3) MG/3ML Soln Commonly known as: DUONEB Take 3 mLs by nebulization every 4 (four) hours as needed.   multivitamin with minerals tablet Take 1 tablet by mouth daily.   predniSONE  20 MG tablet Commonly known as: DELTASONE  Take 2 tablets (40 mg total) by mouth daily for 2 days.   Trelegy Ellipta  100-62.5-25 MCG/ACT Aepb Generic drug: Fluticasone -Umeclidin-Vilant Inhale 1 puff into the lungs daily.        Discharge Instructions: Please refer to Patient Instructions section of EMR for full details.  Patient was counseled important signs and symptoms that should prompt return to medical care, changes in medications, dietary instructions, activity restrictions, and follow up appointments.   Follow-Up Appointments:  Future Appointments  Date Time Provider Department Center  03/14/2024 11:10 AM Ernestina Headland, MD [facility]    Lydia Bibber,  DO 03/12/2024, 10:01 AM PGY-1, [facility] Family Medicine

## 2024-03-12 NOTE — Plan of Care (Signed)

## 2024-03-14 ENCOUNTER — Ambulatory Visit: Payer: Self-pay | Admitting: Student

## 2024-03-18 ENCOUNTER — Ambulatory Visit (INDEPENDENT_AMBULATORY_CARE_PROVIDER_SITE_OTHER): Admitting: Family Medicine

## 2024-03-18 VITALS — BP 138/71 | HR 81 | Ht 65.0 in | Wt 199.4 lb

## 2024-03-18 DIAGNOSIS — J4550 Severe persistent asthma, uncomplicated: Secondary | ICD-10-CM

## 2024-03-18 DIAGNOSIS — F411 Generalized anxiety disorder: Secondary | ICD-10-CM

## 2024-03-18 NOTE — Patient Instructions (Signed)
 It was wonderful to see you today! Thank you for choosing [facility].   Please bring ALL of your medications with you to every visit.   Today we talked about:  I referred you to the pulmonologist as we discussed since your COPD and asthma are so severe even with the use of the Trelegy.  Our office will follow-up with you with this referral information.  I do think you can restart your Claritin  and montelukast  and please use the Flonase  as this will likely help with your allergy symptoms. As a mention you are wheezing on exam today but I think we can continue to manage conservatively if you feel okay.  If your symptoms get worse keep in mind we may need to use more steroids to help. I included a list of counseling resources below, please find one that best suits you. As we discussed I think you just need time to allow yourself to heal and focus on a well-balanced diet.  I do not think B12 shots will give you the energy you are wanting in the IV clinic therapies often do not provide much benefit and do not have any clinical evidence behind them.   Please follow up in 2-3 months  If you haven't already, sign up for My Chart to have easy access to your labs results, and communication with your primary care physician.  Call the clinic at [phone] if your symptoms worsen or you have any concerns.  Please be sure to schedule follow up at the front desk before you leave today.   Jonne Netters, DO Family Medicine     Therapy and Counseling Resources Most providers on this list will take Medicaid. Patients with commercial insurance or Medicare should contact their insurance company to get a list of in network providers.  [facility] (takes children) Location 1: [address] [place], [place] [address] Location 2: [address] [place], [place] [address] [phone]   [facility] (spanish speaking therapist available)(habla espanol)(take medicare and  medicaid)  [address], [place], [place] [address], [place]  [email or web address] [phone]  [facility]ing [address] [place], [place] [address] [phone]  [facility]   [address] Suite [place], [place] [address]      [phone]  [facility] (spanish available) [address]  [place], [place] [address] [phone]  [facility] (take [facility] and medicare) [address], [address]  [place], [place] [address]       [phone]     [facility] (virtual only) [phone]  [facility] [address] [place], [place] [phone]  [facility]:  [address] [place] [phone]  [facility]:  [address] [place] [phone]  [facility] (under & uninsured) [address]   [place] [phone]    [email or web address]    [facility] [address]  [place]    [phone]  [facility] -[address]     Phone:  [phone]     [facility] [place]-  [address]  [phone]   [facility] [address]      [place], [place] [phone]  [facility]: [address] [place], [place], [place]  [phone]   [facility] (Spanish therapist) [email or web address]  [address]  [place] [address]    [phone]    [facility]   [address],  [place], [place]  [phone]   [facility]  [address] [place]  [phone]  [facility]  [address] [place], [place]        [phone]  Open Access/Walk In Clinic under & uninsured  [facility]  [address] [place], [place] [phone] Crisis [phone]  [facility]  [address],  (Spanish)   [address] , [place]: [phone]) 8:30 - 12; 1 - 2:30  [facility] [employer],  [address] [place], [place]    [phone]):8:30 - 12; 2 - 3PM  [facility] [employer],  [address]  [place]; [phone]):   Mon - Fri 8 AM - 5 PM  [facility] [address] [place]  MWF 12:30 to 3:00 or call to schedule an appointment  [phone]  Specific Provider options Psychology Today  [email or web address]/us  click on find a therapist  enter your zip code left side and select or tailor a therapist for your specific need.   [facility] Provider Directory [email or web address]  (Medicaid)   Follow all drop down to find a provider  Social Support program [facility] [phone] or PhotoSolver.pl [address], [place], [place] Recovery support and educational   24- Hour Availability:   [facility]  [address] [place], [place] [phone] Crisis [phone]  [facility] of the [facility] [phone]  [place] [facility]  [phone]   [facility]  [phone] (after hours)  Therapeutic Alternative/Mobile Crisis   [phone]  [place]  National Suicide Hotline  [phone] Derrel Flies)  Call 911 or go to emergency room  [facility]  [phone]);  [place] and [employer]  [phone]); [place], [place], [place], [place], [place], [place], [place]

## 2024-03-18 NOTE — Progress Notes (Unsigned)
    SUBJECTIVE:   CHIEF COMPLAINT / HPI:   Hospital f/u Admitted from 5/19 to 5/21 for COPD/asthma exacerbation at [facility] hospital.  Burning nose, itchy eyes, ear congestion x 2 days. No fever or sick contacts. Used Albuterol  this AM.   No breathing well. Using Trelegy. No taking Claritin  or Montelukast .   Does not want to do Abilify. Retired now, doing better. Frusrated for being sick x 1 month.   PCP Follow-up Recommendations: Pulm follow up outpatient Follow up if patient is able to access Trelegy (should have received in mail 5/22). See if numbness/tingling improving and work up  Recommend PFTs, Pulmonology Consider Psychiatry consult for BPD. Offered atarax  10 mg in hospital for anxiety. Pt did not use, but may benefit outpt. H/o prolonged QTc; recheck EKG on follow up.  PERTINENT  PMH / PSH: ***  OBJECTIVE:   There were no vitals taken for this visit. ***  General: NAD, pleasant, able to participate in exam Cardiac: RRR, no murmurs. Respiratory: CTAB, normal effort, No wheezes, rales or rhonchi Abdomen: Bowel sounds present, nontender, nondistended Extremities: no edema or cyanosis. Skin: warm and dry, no rashes noted Neuro: alert, no obvious focal deficits Psych: Normal affect and mood  ASSESSMENT/PLAN:   No problem-specific Assessment & Plan notes found for this encounter.     Dr. Jonne Netters, DO [facility]    {    This will disappear when note is signed, click to select method of visit    :1}

## 2024-03-19 NOTE — Assessment & Plan Note (Signed)
 No indication for Abilify upon exam today.  Appropriate and linear, mood disturbance during hospitalization likely secondary to illness vs medication.  Will refer to counseling at patient request. -Provided list of therapy resources

## 2024-03-21 ENCOUNTER — Other Ambulatory Visit (HOSPITAL_COMMUNITY): Payer: Self-pay

## 2024-03-21 MED ORDER — ALBUTEROL SULFATE HFA 108 (90 BASE) MCG/ACT IN AERS
2.0000 | INHALATION_SPRAY | Freq: Four times a day (QID) | RESPIRATORY_TRACT | 4 refills | Status: AC | PRN
  Filled 2024-03-21: qty 18, 25d supply, fill #0
  Filled 2024-05-29: qty 18, 25d supply, fill #1

## 2024-03-24 NOTE — Telephone Encounter (Signed)
 Patient approved for assistance.   Medication will need refills sent to company.   Please print and sign a new RX (written for quantity #3 with refills). Company fills 90 day supplies at a time. This can be placed in my box. Thanks!

## 2024-03-31 ENCOUNTER — Other Ambulatory Visit: Payer: Self-pay | Admitting: Student

## 2024-03-31 DIAGNOSIS — J4541 Moderate persistent asthma with (acute) exacerbation: Secondary | ICD-10-CM

## 2024-03-31 MED ORDER — TRELEGY ELLIPTA 100-62.5-25 MCG/ACT IN AEPB: 1.0000 | INHALATION_SPRAY | Freq: Every day | RESPIRATORY_TRACT | 3 refills | Status: DC

## 2024-03-31 NOTE — Telephone Encounter (Signed)
 Rx left in your mailbox

## 2024-03-31 NOTE — Progress Notes (Signed)
 Printing Rx for Trellegy to be sent to [facility] for aid with medication cost.

## 2024-04-01 ENCOUNTER — Encounter: Payer: Self-pay | Admitting: *Deleted

## 2024-04-01 NOTE — Telephone Encounter (Signed)
 RX sent to [facility] and scanned to patients media.

## 2024-05-30 ENCOUNTER — Other Ambulatory Visit: Payer: Self-pay

## 2024-05-30 ENCOUNTER — Other Ambulatory Visit (HOSPITAL_COMMUNITY): Payer: Self-pay

## 2024-06-17 ENCOUNTER — Ambulatory Visit: Admitting: Family Medicine

## 2024-06-24 ENCOUNTER — Ambulatory Visit: Admitting: Family Medicine

## 2024-06-24 NOTE — Progress Notes (Deleted)
   SUBJECTIVE:   CHIEF COMPLAINT / HPI:  Lydia Griffin is a 63 y.o. female with a pertinent past medical history of COPD, severe persistent asthma, and HTN presenting to the clinic for wheezing and concerns about sinus infection.  Wheezing, *** *** Medications: Albuterol  2 puffs Q***h, Trelegy Ellipta  daily, montelukast  10 mg, fluticasone , loratadine  OTC daily Patient endorses using her maintenance inhaler daily. She is using her rescue inhaler *** Patient was referred to Pulmonology in May 2025 but has not seen them yet.   PERTINENT PMH / PSH: Severe persistent asthma and COPD with multiple prior exacerbations HTN  *Remainder reviewed in problem list.   OBJECTIVE:   There were no vitals taken for this visit. Physical Exam General: Age-appropriate, resting comfortably in chair, NAD, alert and at baseline. HEENT:  Head: Normocephalic, atraumatic. No tenderness to percussion over sinuses. Eyes: PERRLA. No conjunctival erythema or scleral injections. Ears: TMs non-bulging and non-erythematous bilaterally. No erythema of external ear canal. No cerumen impaction. Nose: Non-erythematous turbinates. No rhinorrhea. Mouth/Oral: Clear, no tonsillar exudate. MMM. Neck: Supple. No LAD. Cardiovascular: Regular rate and rhythm. Normal S1/S2. No murmurs, rubs, or gallops appreciated. 2+ radial pulses. Pulmonary: Clear bilaterally to ascultation. No wheezes, crackles, or rhonchi. Normal WOB on room air. No accessory muscle use. Abdominal: No tenderness to deep or light palpation. No rebound or guarding. No HSM. Skin: Warm and dry. Extremities: No peripheral edema bilaterally. Capillary refill <2 seconds.  No results found for this or any previous visit (from the past 48 hours).     03/18/2024    4:13 PM  Depression screen PHQ 2/9  Decreased Interest 0  Down, Depressed, Hopeless 0  PHQ - 2 Score 0  Altered sleeping 0  Tired, decreased energy 1  Change in appetite 2  Feeling bad or  failure about yourself  0  Trouble concentrating 0  Moving slowly or fidgety/restless 1  Suicidal thoughts 0  PHQ-9 Score 4     ASSESSMENT/PLAN:   Assessment & Plan   No follow-ups on file.  Lydia Lutter Toma, MD [facility]

## 2024-07-14 ENCOUNTER — Other Ambulatory Visit (HOSPITAL_COMMUNITY): Payer: Self-pay

## 2024-07-14 ENCOUNTER — Ambulatory Visit

## 2024-07-14 VITALS — BP 166/72 | HR 67 | Ht 65.0 in | Wt 191.8 lb

## 2024-07-14 DIAGNOSIS — Z111 Encounter for screening for respiratory tuberculosis: Secondary | ICD-10-CM

## 2024-07-14 DIAGNOSIS — Z1231 Encounter for screening mammogram for malignant neoplasm of breast: Secondary | ICD-10-CM

## 2024-07-14 DIAGNOSIS — J4541 Moderate persistent asthma with (acute) exacerbation: Secondary | ICD-10-CM | POA: Diagnosis present

## 2024-07-14 DIAGNOSIS — Z1211 Encounter for screening for malignant neoplasm of colon: Secondary | ICD-10-CM

## 2024-07-14 DIAGNOSIS — Z23 Encounter for immunization: Secondary | ICD-10-CM

## 2024-07-14 DIAGNOSIS — I10 Essential (primary) hypertension: Secondary | ICD-10-CM | POA: Diagnosis not present

## 2024-07-14 DIAGNOSIS — F5101 Primary insomnia: Secondary | ICD-10-CM

## 2024-07-14 MED ORDER — IPRATROPIUM-ALBUTEROL 0.5-2.5 (3) MG/3ML IN SOLN
3.0000 mL | RESPIRATORY_TRACT | 0 refills | Status: AC | PRN
  Filled 2024-07-14: qty 270, 15d supply, fill #0

## 2024-07-14 NOTE — Progress Notes (Signed)
    SUBJECTIVE:   CHIEF COMPLAINT / HPI:   Asthma COPD Overlap - albuterol , trelegy duonebs - symptoms somewhat controlled but feels breathing has gotten worse long term - Has a new job starting in October. Has been on disability for asthma - Feels that her symptoms are not as good as she has not been doing much recently  Insomnia - falls asleep midnight, wakes around 3, then falls back asleep at 8 and wakes at 10 - Has trouble with both onset and duration - Physically feels tired but not sleepy - Works third shift for many years - snores at night. Possible apnea  HTN BP Readings from Last 5 Encounters:  07/14/24 (!) 166/72  03/18/24 138/71  03/12/24 (!) 141/77  03/10/24 (!) 183/88  02/04/24 (!) 141/98  - Takes BP at home, and always under 120/80. Does it every other day.    PERTINENT  PMH / PSH: asthma, COPD  OBJECTIVE:   BP (!) 166/72   Pulse 67   Ht 5' 5 (1.651 m)   Wt 191 lb 12.8 oz (87 kg)   SpO2 94%   BMI 31.92 kg/m   Physical Exam General: Alert, conversant, cooperative. No acute distress.  HEENT: PERRL. EOMI. MMM.  Cardiovascular: RRR Respiratory: Lungs CTAB. Normal work of breathing. Extremities: No cyanosis. No edema Musculoskeletal: No gross deformities.  Skin: Warm. Dry. No rashes. No icterus.  Neurologic: No focal deficits. Moving all extremities. Psychiatric: Cooperative. Appropriate mood. Appropriate affect.   ASSESSMENT/PLAN:   Assessment & Plan Encounter for screening mammogram for malignant neoplasm of breast - Last mammogram several years ago, referral placed Colon cancer screening - Never had colon cancer screening. No family hx colon cancer. Agreeable to colonoscopy. Referral placed Moderate persistent asthma with acute exacerbation Symptoms today are stable. She has not needed to use rescue inhaler. She does think symptoms are better when she is more active. Discussed adding walking to her daily schedule. This will be easier when  she goes back to work.  Primary hypertension BP elevated today. She reports she checks BP at home daily and it is always less than 130/80. Encouraged to bring home cuff to clinic to make sure it is calibrated. She is hesitant to begin medication at this time Primary insomnia Overall likely due to working 3rd shift, minimal OSA symptoms. Discussed sleep hygiene.  PPD screening test  Encounter for immunization      Lydia LITTIE Deed, MD [facility]

## 2024-07-14 NOTE — Assessment & Plan Note (Signed)
 BP elevated today. She reports she checks BP at home daily and it is always less than 130/80. Encouraged to bring home cuff to clinic to make sure it is calibrated. She is hesitant to begin medication at this time

## 2024-07-16 ENCOUNTER — Ambulatory Visit

## 2024-07-16 DIAGNOSIS — Z111 Encounter for screening for respiratory tuberculosis: Secondary | ICD-10-CM

## 2024-07-16 LAB — TB SKIN TEST
Induration: 0 mm
TB Skin Test: NEGATIVE

## 2024-07-16 NOTE — Progress Notes (Signed)
 Patient is here for a PPD read.  It was placed on 07/14/2024 in the left forearm @ 1135 am.    PPD RESULTS:  Result: negative Induration: 0 mm  Letter created and given to patient for documentation purposes. Chiquita JAYSON English, RN

## 2024-07-23 ENCOUNTER — Ambulatory Visit

## 2024-07-23 ENCOUNTER — Other Ambulatory Visit (HOSPITAL_COMMUNITY): Payer: Self-pay

## 2024-08-06 ENCOUNTER — Ambulatory Visit

## 2024-08-06 NOTE — Progress Notes (Deleted)
    SUBJECTIVE:   CHIEF COMPLAINT / HPI: cold symptoms  Discussed the use of AI scribe software for clinical note transcription with the patient, who gave verbal consent to proceed.  History of Present Illness     PERTINENT  PMH / PSH: Severe asthma, COPD, HTN, GAD  OBJECTIVE:   There were no vitals taken for this visit.  Physical Exam   @RESULTSSEC @  ASSESSMENT/PLAN:   Assessment & Plan  Assessment and Plan Assessment & Plan      No follow-ups on file.  Lydia Provencal, MD, PGY-3 [facility] 7:54 AM 08/06/2024  [facility]

## 2024-08-20 ENCOUNTER — Telehealth: Payer: Self-pay

## 2024-08-20 DIAGNOSIS — J4541 Moderate persistent asthma with (acute) exacerbation: Secondary | ICD-10-CM

## 2024-08-20 MED ORDER — TRELEGY ELLIPTA 100-62.5-25 MCG/ACT IN AEPB: 1.0000 | INHALATION_SPRAY | Freq: Every day | RESPIRATORY_TRACT | 3 refills | Status: DC

## 2024-08-20 MED ORDER — TRELEGY ELLIPTA 100-62.5-25 MCG/ACT IN AEPB: 1.0000 | INHALATION_SPRAY | Freq: Every day | RESPIRATORY_TRACT | 2 refills | Status: AC

## 2024-08-20 NOTE — Addendum Note (Signed)
 Addended byBETHA TOMA MATAS on: 08/20/2024 12:09 PM   Modules accepted: Orders

## 2024-08-20 NOTE — Telephone Encounter (Signed)
Sorry, fixed.

## 2024-08-20 NOTE — Telephone Encounter (Signed)
 Spoke with [employer] rep:  Last shipment 05/08/24 for 3 month supply. RX was written for one month supply with refills.   Please place a new hardcopy RX for Trelegy, written for a 3 month supply (#180) with refills, in my box. I will get this faxed to company asap. Thanks!!  Once rec'd, it should process in 7-10 business days.

## 2024-08-20 NOTE — Telephone Encounter (Signed)
 Patient calls nurse line regarding Trelegy prescription. Patient reports that she has about two weeks left of medication.   She reports that she spoke with [employer] representative this morning who advised that provider would need to place new orders for Trelegy.   Unsure of process for this. Will forward to [place] for further assistance.   Chiquita JAYSON English, RN

## 2024-08-20 NOTE — Telephone Encounter (Signed)
 Printed prescription for Trelegy 60 each, 3 refills.  Placed in your box, [place].  Thanks!

## 2024-08-26 NOTE — Telephone Encounter (Signed)
 RX faxed to [facility]

## 2024-10-30 ENCOUNTER — Ambulatory Visit: Payer: Self-pay | Admitting: Family Medicine

## 2024-10-30 ENCOUNTER — Other Ambulatory Visit: Payer: Self-pay

## 2024-10-30 ENCOUNTER — Other Ambulatory Visit (HOSPITAL_COMMUNITY): Payer: Self-pay

## 2024-10-30 VITALS — BP 123/80 | HR 81 | Wt 181.8 lb

## 2024-10-30 DIAGNOSIS — J441 Chronic obstructive pulmonary disease with (acute) exacerbation: Secondary | ICD-10-CM

## 2024-10-30 DIAGNOSIS — Z0000 Encounter for general adult medical examination without abnormal findings: Secondary | ICD-10-CM | POA: Diagnosis not present

## 2024-10-30 DIAGNOSIS — Z1211 Encounter for screening for malignant neoplasm of colon: Secondary | ICD-10-CM

## 2024-10-30 MED ORDER — OSELTAMIVIR PHOSPHATE 75 MG PO CAPS
75.0000 mg | ORAL_CAPSULE | Freq: Two times a day (BID) | ORAL | 0 refills | Status: AC
  Filled 2024-10-30 (×2): qty 10, 5d supply, fill #0

## 2024-10-30 MED ORDER — PREDNISONE 50 MG PO TABS
50.0000 mg | ORAL_TABLET | Freq: Every day | ORAL | 0 refills | Status: AC
  Filled 2024-10-30 (×2): qty 5, 5d supply, fill #0

## 2024-10-30 NOTE — Progress Notes (Signed)
" ° °  SUBJECTIVE:   CHIEF COMPLAINT / HPI:  Discussed the use of AI scribe software for clinical note transcription with the patient, who gave verbal consent to proceed.  History of Present Illness Lydia Griffin is a 64 year old female with COPD who presents with respiratory symptoms and nausea.  Respiratory symptoms - Scratchy throat, persistent cough, and difficulty taking deep breaths for the past two days - Pleuritic chest pain localized to her side, worsened due to coughing - Fever to 100.58F with chills last night - Increased sputum production and wheezing despite regular use of Trelegy - Symptoms began Tuesday night after exposure to a sick grandchild - Dyspnea present, more than her usual with COPD  Gastrointestinal symptoms - Nausea with vomiting, onset two days ago - Vomited this morning without blood - Constipation present - No diarrhea or blood in stool - Poor appetite since onset of symptoms  Impact on daily functioning - Self-employed now and concerned about symptom control due to lack of coverage for missed work  OBJECTIVE:  BP 123/80   Pulse 81   Wt 181 lb 12.8 oz (82.5 kg)   SpO2 97%   BMI 30.25 kg/m   Physical Exam GENERAL: Alert, cooperative, very pleasant, well developed, no acute distress CHEST: Inspiratory and expiratory wheezes throughout, no crackles, breathing and speaking comfortably on RA CARDIOVASCULAR: Normal heart rate and rhythm, S1 and S2 normal without murmurs ABDOMEN: Soft, non-distended, normal bowel sounds NEUROLOGICAL: Cranial nerves grossly intact, moves all extremities without gross motor or sensory deficit  ASSESSMENT/PLAN:   Assessment & Plan COPD exacerbation (HCC) Mild COPD exacerbation with increased wheezing and sputum production without respiratory distress today. Symptoms likely caused by influenza given her history and the high prevalence in the community. - Prescribed Tamiflu  1 pill twice a day for 5 days. - Prescribed  prednisone  burst for 5 days. - Continue Trelegy as prescribed. - Advised to keep masked and practice hand hygiene. - Instructed to send MyChart message and seek urgent care or ED if experiencing difficulty breathing or worsening chest pain. Healthcare maintenance Cologuard ordered. She will schedule her mammogram when feeling better.  Stuart Redo, MD [facility] "

## 2024-10-30 NOTE — Assessment & Plan Note (Addendum)
 Mild COPD exacerbation with increased wheezing and sputum production without respiratory distress today. Symptoms likely caused by influenza given her history and the high prevalence in the community. - Prescribed Tamiflu  1 pill twice a day for 5 days. - Prescribed prednisone  burst for 5 days. - Continue Trelegy as prescribed. - Advised to keep masked and practice hand hygiene. - Instructed to send MyChart message and seek urgent care or ED if experiencing difficulty breathing or worsening chest pain.

## 2024-10-30 NOTE — Patient Instructions (Signed)
 VISIT SUMMARY: You came in today with respiratory symptoms and nausea. You were diagnosed with influenza and a COPD exacerbation. We have started you on medications to help manage these conditions.  YOUR PLAN: INFLUENZA WITH RESPIRATORY MANIFESTATIONS: You have the flu, which is causing your respiratory symptoms. -Take Tamiflu  1 pill twice a day for 5 days. -Keep masked and practice hand hygiene. -Send a MyChart message if your symptoms worsen. -Seek urgent care or go to the emergency room if you have difficulty breathing or worsening chest pain.  CHRONIC OBSTRUCTIVE PULMONARY DISEASE (COPD) WITH ACUTE EXACERBATION: Your COPD is flaring up, causing increased wheezing and sputum production. -Take a prednisone  burst for 5 days. -Continue using Trelegy as prescribed. -Seek urgent care or go to the emergency room if you have difficulty breathing or worsening chest pain.  GENERAL HEALTH MAINTENANCE: You are due for some routine health screenings. -A Cologuard kit has been sent to your home for colon cancer screening. -Schedule a mammogram when you are feeling better.  Please let me know if you have any other questions.  Dr. Tharon
